# Patient Record
Sex: Female | Born: 1965
Health system: Southern US, Community
[De-identification: ages and names within clinical notes are randomized; demographics above are authoritative.]

## PROBLEM LIST (undated history)

## (undated) DIAGNOSIS — I10 Essential (primary) hypertension: Secondary | ICD-10-CM

## (undated) DIAGNOSIS — I700 Atherosclerosis of aorta: Secondary | ICD-10-CM

## (undated) DIAGNOSIS — K219 Gastro-esophageal reflux disease without esophagitis: Secondary | ICD-10-CM

## (undated) DIAGNOSIS — S82899A Other fracture of unspecified lower leg, initial encounter for closed fracture: Secondary | ICD-10-CM

## (undated) DIAGNOSIS — E119 Type 2 diabetes mellitus without complications: Secondary | ICD-10-CM

## (undated) DIAGNOSIS — M19019 Primary osteoarthritis, unspecified shoulder: Secondary | ICD-10-CM

## (undated) DIAGNOSIS — M179 Osteoarthritis of knee, unspecified: Secondary | ICD-10-CM

## (undated) DIAGNOSIS — J40 Bronchitis, not specified as acute or chronic: Secondary | ICD-10-CM

## (undated) DIAGNOSIS — G4730 Sleep apnea, unspecified: Secondary | ICD-10-CM

## (undated) HISTORY — DX: Osteoarthritis of knee, unspecified: M17.9

## (undated) HISTORY — PX: COLONOSCOPY: SHX174

## (undated) HISTORY — PX: NO PAST SURGERIES: SHX2092

## (undated) HISTORY — DX: Essential (primary) hypertension: I10

---

## 2014-05-30 DIAGNOSIS — S82899A Other fracture of unspecified lower leg, initial encounter for closed fracture: Secondary | ICD-10-CM

## 2014-05-30 HISTORY — DX: Other fracture of unspecified lower leg, initial encounter for closed fracture: S82.899A

## 2015-04-13 ENCOUNTER — Emergency Department (HOSPITAL_COMMUNITY)
Admission: EM | Admit: 2015-04-13 | Discharge: 2015-04-13 | Disposition: A | Discharge status: Home / Self Care | Payer: Self-pay | Attending: Emergency Medicine | Admitting: Emergency Medicine

## 2015-04-13 ENCOUNTER — Encounter (HOSPITAL_COMMUNITY): Payer: Self-pay | Admitting: Family Medicine

## 2015-04-13 DIAGNOSIS — I889 Nonspecific lymphadenitis, unspecified: Secondary | ICD-10-CM | POA: Insufficient documentation

## 2015-04-13 DIAGNOSIS — H9209 Otalgia, unspecified ear: Secondary | ICD-10-CM | POA: Insufficient documentation

## 2015-04-13 DIAGNOSIS — F17210 Nicotine dependence, cigarettes, uncomplicated: Secondary | ICD-10-CM | POA: Insufficient documentation

## 2015-04-13 DIAGNOSIS — J029 Acute pharyngitis, unspecified: Secondary | ICD-10-CM | POA: Insufficient documentation

## 2015-04-13 LAB — RAPID STREP SCREEN (MED CTR MEBANE ONLY): STREPTOCOCCUS, GROUP A SCREEN (DIRECT): NEGATIVE

## 2015-04-13 MED ORDER — IBUPROFEN 600 MG PO TABS: 600.0000 mg | ORAL_TABLET | Freq: Four times a day (QID) | ORAL | Status: DC | PRN

## 2015-04-13 MED ORDER — HYDROCODONE-ACETAMINOPHEN 7.5-325 MG/15ML PO SOLN
10.0000 mL | Freq: Once | ORAL | Status: AC
  Administered 2015-04-13: 10 mL via ORAL
  Filled 2015-04-13: qty 15

## 2015-04-13 MED ORDER — NAPROXEN 500 MG PO TABS
500.0000 mg | ORAL_TABLET | Freq: Once | ORAL | Status: AC
  Administered 2015-04-13: 500 mg via ORAL
  Filled 2015-04-13: qty 1

## 2015-04-13 MED ORDER — HYDROCODONE-ACETAMINOPHEN 7.5-325 MG/15ML PO SOLN: 15.0000 mL | Freq: Three times a day (TID) | ORAL | Status: DC | PRN

## 2015-04-13 MED ORDER — DEXAMETHASONE SODIUM PHOSPHATE 10 MG/ML IJ SOLN
10.0000 mg | Freq: Once | INTRAMUSCULAR | Status: AC
  Administered 2015-04-13: 10 mg via INTRAMUSCULAR
  Filled 2015-04-13: qty 1

## 2015-04-13 NOTE — ED Notes (Addendum)
Pt is complaining of sore throat with horseness and left ear ache yesterday. Reports fever. Took IBUPROFEN 800mg  at 20:00 last night.

## 2015-04-13 NOTE — ED Provider Notes (Signed)
CSN: [id number]     Arrival date & time 04/13/15  0138 History   First MD Initiated Contact with Patient 04/13/15 0148     Chief Complaint  Patient presents with  . Sore Throat  . Otalgia    (Consider location/radiation/quality/duration/timing/severity/associated sxs/prior Treatment) HPI Comments: 49 year old female presents to the emergency department for further evaluation of sore throat. Patient states that sore throat began yesterday and has become progressively worse. Symptoms aggravated with swallowing. She reports some hoarseness to her voice as well as developing a left earache which is pressure-like in nature. Patient reports taking ibuprofen which helped for a portion of the afternoon yesterday. Patient states that pain worsened when the medication wore off. She states that she has been around her grandson who is 80 years old and recently had an upper respiratory infection. She denies any other known sick contacts. No fever, ear discharge, or drooling. No shortness of breath.  Patient is a 49 y.o. female presenting with pharyngitis and ear pain. The history is provided by the patient. No language interpreter was used.  Sore Throat Associated symptoms include congestion and a sore throat. Pertinent negatives include no coughing, fever or vomiting.  Otalgia Associated symptoms: congestion and sore throat   Associated symptoms: no cough, no diarrhea, no fever and no vomiting     History reviewed. No pertinent past medical history. History reviewed. No pertinent past surgical history. History reviewed. No pertinent family history. Social History  Substance Use Topics  . Smoking status: Current Every Day Smoker -- 0.50 packs/day    Types: Cigarettes  . Smokeless tobacco: None  . Alcohol Use: No   OB History    No data available      Review of Systems  Constitutional: Negative for fever.  HENT: Positive for congestion, ear pain (pressure L ear), sinus pressure and sore  throat.   Respiratory: Negative for cough and shortness of breath.   Gastrointestinal: Negative for vomiting and diarrhea.  All other systems reviewed and are negative.   Allergies  Review of patient's allergies indicates not on file.  Home Medications   Prior to Admission medications   Medication Sig Start Date End Date Taking? Authorizing Provider  HYDROcodone-acetaminophen (HYCET) 7.5-325 mg/15 ml solution Take 15 mLs by mouth every 8 (eight) hours as needed for moderate pain or severe pain. 04/13/15   Antonietta Breach, PA-C  ibuprofen (ADVIL,MOTRIN) 600 MG tablet Take 1 tablet (600 mg total) by mouth every 6 (six) hours as needed. 04/13/15   Antonietta Breach, PA-C   BP 135/81 mmHg  Pulse 78  Temp(Src) 98.1 F (36.7 C) (Oral)  Resp 13  Ht 5\' 2"  (1.575 m)  Wt 239 lb (108.41 kg)  BMI 43.70 kg/m2  SpO2 93%   Physical Exam  Constitutional: She is oriented to person, place, and time. She appears well-developed and well-nourished. No distress.  Nontoxic/nonseptic appearing  HENT:  Head: Normocephalic and atraumatic.  Right Ear: External ear and ear canal normal. No drainage. Tympanic membrane is injected (injected compared to left TM). Tympanic membrane is not perforated.  Left Ear: Tympanic membrane, external ear and ear canal normal. No drainage. Tympanic membrane is not perforated.  Nose: Right sinus exhibits no maxillary sinus tenderness and no frontal sinus tenderness. Left sinus exhibits maxillary sinus tenderness. Left sinus exhibits no frontal sinus tenderness.  Mouth/Throat: Uvula is midline and mucous membranes are normal. No trismus in the jaw. Posterior oropharyngeal edema (mild) and posterior oropharyngeal erythema present. No oropharyngeal exudate or tonsillar  abscesses.  Posterior oropharyngeal erythema with mild edema. Uvula midline. Patient tolerating secretions with discomfort. No drooling. No stridor noted.  Eyes: Conjunctivae and EOM are normal. No scleral icterus.  Neck:  Normal range of motion.  No nuchal rigidity or meningismus  Pulmonary/Chest: Effort normal. No respiratory distress.  Respirations even and unlabored  Musculoskeletal: Normal range of motion.  Lymphadenopathy:       Head (right side): Submandibular (mild) adenopathy present. No submental, no tonsillar and no preauricular adenopathy present.       Head (left side): Submandibular, tonsillar and preauricular adenopathy present.  Tender lymphadenopathy  Neurological: She is alert and oriented to person, place, and time. She exhibits normal muscle tone. Coordination normal.  Skin: Skin is warm and dry. No rash noted. She is not diaphoretic. No erythema. No pallor.  Psychiatric: She has a normal mood and affect. Her behavior is normal.  Nursing note and vitals reviewed.   ED Course  Procedures (including critical care time) Labs Review Labs Reviewed  RAPID STREP SCREEN (NOT AT [facility])  CULTURE, GROUP A STREP    Imaging Review No results found.   I have personally reviewed and evaluated these images and lab results as part of my medical decision-making.   EKG Interpretation None      MDM   Final diagnoses:  Viral pharyngitis  Lymphadenitis    Patient presents with cervical lymphadenopathy and dysphagia, onset yesterday. Pt afebrile without tonsillar exudate, negative strep; diagnosis of viral pharyngitis. No abx indicated. Patient tolerating secretions. No nuchal rigidity or meningismus. No stridor or trismus. Presentation not concerning for PTA or infxn spread to soft tissue. No trismus or uvula deviation. Will d/c with symptomatic tx for pain and have discussed importance of oral fluid hydration. Specific return precautions discussed. Recommended PCP follow up; patient is reliable for PCP f/u. Patient discharged in good condition with no unaddressed concerns.   Filed Vitals:   04/13/15 0145 04/13/15 0406  BP: 133/76 135/81  Pulse: 85 78  Temp: 98.1 F (36.7 C)   TempSrc:  Oral   Resp: 20 13  Height: 5\' 2"  (1.575 m)   Weight: 239 lb (108.41 kg)   SpO2: 96% 93%     Antonietta Breach, PA-C [id number]  Delora Fuel, MD [phone] [id number]

## 2015-04-13 NOTE — Discharge Instructions (Signed)
Use salt water gargles 2-3 times per day. Take Ibuprofen as prescribed for pain and swelling. You may use Hycet for worsening sore throat. Drink warm liquids to try and soothe your throat such as warm tea with honey. Follow up with your primary care provider on Tuesday for a recheck of symptoms. Return to the ED as needed if symptoms worsen.  Pharyngitis Pharyngitis is redness, pain, and swelling (inflammation) of your pharynx.  CAUSES  Pharyngitis is usually caused by infection. Most of the time, these infections are from viruses (viral) and are part of a cold. However, sometimes pharyngitis is caused by bacteria (bacterial). Pharyngitis can also be caused by allergies. Viral pharyngitis may be spread from person to person by coughing, sneezing, and personal items or utensils (cups, forks, spoons, toothbrushes). Bacterial pharyngitis may be spread from person to person by more intimate contact, such as kissing.  SIGNS AND SYMPTOMS  Symptoms of pharyngitis include:   Sore throat.   Tiredness (fatigue).   Low-grade fever.   Headache.  Joint pain and muscle aches.  Skin rashes.  Swollen lymph nodes.  Plaque-like film on throat or tonsils (often seen with bacterial pharyngitis). DIAGNOSIS  Your health care provider will ask you questions about your illness and your symptoms. Your medical history, along with a physical exam, is often all that is needed to diagnose pharyngitis. Sometimes, a rapid strep test is done. Other lab tests may also be done, depending on the suspected cause.  TREATMENT  Viral pharyngitis will usually get better in 3-4 days without the use of medicine. Bacterial pharyngitis is treated with medicines that kill germs (antibiotics).  HOME CARE INSTRUCTIONS   Drink enough water and fluids to keep your urine clear or pale yellow.   Only take over-the-counter or prescription medicines as directed by your health care provider:   If you are prescribed antibiotics,  make sure you finish them even if you start to feel better.   Do not take aspirin.   Get lots of rest.   Gargle with 8 oz of salt water ( tsp of salt per 1 qt of water) as often as every 1-2 hours to soothe your throat.   Throat lozenges (if you are not at risk for choking) or sprays may be used to soothe your throat. SEEK MEDICAL CARE IF:   You have large, tender lumps in your neck.  You have a rash.  You cough up green, yellow-brown, or bloody spit. SEEK IMMEDIATE MEDICAL CARE IF:   Your neck becomes stiff.  You drool or are unable to swallow liquids.  You vomit or are unable to keep medicines or liquids down.  You have severe pain that does not go away with the use of recommended medicines.  You have trouble breathing (not caused by a stuffy nose). MAKE SURE YOU:   Understand these instructions.  Will watch your condition.  Will get help right away if you are not doing well or get worse.   This information is not intended to replace advice given to you by your health care provider. Make sure you discuss any questions you have with your health care provider.   Document Released: 05/16/2005 Document Revised: 03/06/2013 Document Reviewed: 01/21/2013 Elsevier Interactive Patient Education 2016 [employer]  Lymphadenopathy Lymphadenopathy refers to swollen or enlarged lymph glands, also called lymph nodes. Lymph glands are part of your body's defense (immune) system, which protects the body from infections, germs, and diseases. Lymph glands are found in many locations in your  body, including the neck, underarm, and groin.  Many things can cause lymph glands to become enlarged. When your immune system responds to germs, such as viruses or bacteria, infection-fighting cells and fluid build up. This causes the glands to grow in size. Usually, this is not something to worry about. The swelling and any soreness often go away without treatment. However, swollen lymph glands  can also be caused by a number of diseases. Your health care provider may do various tests to help determine the cause. If the cause of your swollen lymph glands cannot be found, it is important to monitor your condition to make sure the swelling goes away. HOME CARE INSTRUCTIONS Watch your condition for any changes. The following actions may help to lessen any discomfort you are feeling:  Get plenty of rest.  Take medicines only as directed by your health care provider. Your health care provider may recommend over-the-counter medicines for pain.  Apply moist heat compresses to the site of swollen lymph nodes as directed by your health care provider. This can help reduce any pain.  Check your lymph nodes daily for any changes.  Keep all follow-up visits as directed by your health care provider. This is important. SEEK MEDICAL CARE IF:  Your lymph nodes are still swollen after 2 weeks.  Your swelling increases or spreads to other areas.  Your lymph nodes are hard, seem fixed to the skin, or are growing rapidly.  Your skin over the lymph nodes is red and inflamed.  You have a fever.  You have chills.  You have fatigue.  You develop a sore throat.  You have abdominal pain.  You have weight loss.  You have night sweats. SEEK IMMEDIATE MEDICAL CARE IF:  You notice fluid leaking from the area of the enlarged lymph node.  You have severe pain in any area of your body.  You have chest pain.  You have shortness of breath.   This information is not intended to replace advice given to you by your health care provider. Make sure you discuss any questions you have with your health care provider.   Document Released: 02/23/2008 Document Revised: 06/06/2014 Document Reviewed: 12/19/2013 Elsevier Interactive Patient Education [employer].

## 2015-04-15 ENCOUNTER — Emergency Department (HOSPITAL_COMMUNITY)
Admission: EM | Admit: 2015-04-15 | Discharge: 2015-04-15 | Disposition: A | Discharge status: Home / Self Care | Payer: Self-pay | Attending: Emergency Medicine | Admitting: Emergency Medicine

## 2015-04-15 ENCOUNTER — Encounter (HOSPITAL_COMMUNITY): Payer: Self-pay | Admitting: Emergency Medicine

## 2015-04-15 DIAGNOSIS — H6692 Otitis media, unspecified, left ear: Secondary | ICD-10-CM | POA: Insufficient documentation

## 2015-04-15 DIAGNOSIS — F17210 Nicotine dependence, cigarettes, uncomplicated: Secondary | ICD-10-CM | POA: Insufficient documentation

## 2015-04-15 DIAGNOSIS — J029 Acute pharyngitis, unspecified: Secondary | ICD-10-CM | POA: Insufficient documentation

## 2015-04-15 LAB — CULTURE, GROUP A STREP

## 2015-04-15 MED ORDER — AMOXICILLIN 500 MG PO CAPS: 500.0000 mg | ORAL_CAPSULE | Freq: Two times a day (BID) | ORAL | Status: DC

## 2015-04-15 MED ORDER — CETIRIZINE-PSEUDOEPHEDRINE ER 5-120 MG PO TB12: 1.0000 | ORAL_TABLET | Freq: Two times a day (BID) | ORAL | Status: DC

## 2015-04-15 NOTE — ED Provider Notes (Signed)
CSN: [id number]     Arrival date & time 04/15/15  1633 History  By signing my name below, I, Helane Gunther, attest that this documentation has been prepared under the direction and in the presence of [employer], PA-C. Electronically Signed: Helane Gunther, ED Scribe. 04/15/2015. 6:27 PM.    Chief Complaint  Patient presents with  . Sore Throat   The history is provided by the patient. No language interpreter was used.    HPI Comments: Ann Foster is a 49 y.o. female smoker at 0.5 ppd who was seen 2 days ago for a sore throat, presents to the Emergency Department complaining of constant, aching, left-sided throat pain onset 3 days ago. Pt states this began as a left-sided ear ache, then progressively worsened to painful swallowing and feeling as though her throat was closing, which is when she came to the ED 2 days ago, when she was given liquid hydrocodone, a steroid shot, and motrin. She notes she felt nauseated after taking the liquid hydrocodone and has only been taking motrin, with insufficient relief. She reports associated left-sided, pressure-like ear pain, pain with swallowing, mild cough, left-sided facial pain, rhinorrhea, and generalized body aches. She also notes her eyes have been watery, but states that this is baseline for her. She reports exacerbation of the pain with lying on her left side. She notes alleviation with applying slight pressure to the left side of the neck. She notes she is able to drink warm liquids and eat soup. She states she is currently in the process of weaning herself off of cigarettes. She states she is not taking any allergy medication. She denies a PSHx of tonsillectomy. Pt denies fever (mild fever 2 days ago, none today), neck stiffness, CP, SOB, difficulty breathing, drooling, choking, ear discharge, and decreased hearing.    History reviewed. No pertinent past medical history. History reviewed. No pertinent past surgical history. History reviewed. No  pertinent family history. Social History  Substance Use Topics  . Smoking status: Current Every Day Smoker -- 0.50 packs/day    Types: Cigarettes  . Smokeless tobacco: None  . Alcohol Use: No   OB History    No data available     Review of Systems A complete 10 system review of systems was obtained and all systems are negative except as noted in the HPI and PMH.  Allergies  Review of patient's allergies indicates no known allergies.  Home Medications   Prior to Admission medications   Medication Sig Start Date End Date Taking? Authorizing Provider  amoxicillin (AMOXIL) 500 MG capsule Take 1 capsule (500 mg total) by mouth 2 (two) times daily. 04/15/15   Trevon Strothers, PA-C  cetirizine-pseudoephedrine (ZYRTEC-D) 5-120 MG tablet Take 1 tablet by mouth 2 (two) times daily. 04/15/15   Gloriann Loan, PA-C  HYDROcodone-acetaminophen (HYCET) 7.5-325 mg/15 ml solution Take 15 mLs by mouth every 8 (eight) hours as needed for moderate pain or severe pain. 04/13/15   Antonietta Breach, PA-C  ibuprofen (ADVIL,MOTRIN) 600 MG tablet Take 1 tablet (600 mg total) by mouth every 6 (six) hours as needed. 04/13/15   Antonietta Breach, PA-C   BP 129/75 mmHg  Pulse 88  Temp(Src) 98.2 F (36.8 C) (Oral)  Resp 20  SpO2 96% Physical Exam  Constitutional: She is oriented to person, place, and time. She appears well-developed and well-nourished.  HENT:  Head: Normocephalic and atraumatic.  Right Ear: Hearing, tympanic membrane, external ear and ear canal normal.  Left Ear: Hearing and ear canal  normal. There is tenderness. No drainage or swelling. No mastoid tenderness. Tympanic membrane is injected, erythematous and bulging. Tympanic membrane is not retracted. A middle ear effusion is present. No decreased hearing is noted.  Nose: Nose normal.  Mouth/Throat: Uvula is midline and mucous membranes are normal. No oral lesions. No trismus in the jaw. No uvula swelling. Posterior oropharyngeal erythema present. No  oropharyngeal exudate, posterior oropharyngeal edema or tonsillar abscesses.  Eyes: Conjunctivae are normal. Pupils are equal, round, and reactive to light.  Neck: Normal range of motion and phonation normal. Neck supple. No rigidity. No tracheal deviation present.  Cardiovascular: Normal rate, regular rhythm and normal heart sounds.   No murmur heard. Pulmonary/Chest: Effort normal and breath sounds normal. No accessory muscle usage or stridor. No respiratory distress. She has no wheezes. She has no rhonchi. She has no rales.  Abdominal: Soft. Bowel sounds are normal. She exhibits no distension. There is no tenderness.  Musculoskeletal: Normal range of motion.  Lymphadenopathy:    She has cervical adenopathy (mild, left sided).  Neurological: She is alert and oriented to person, place, and time.  Speech clear without dysarthria.  Skin: Skin is warm and dry. She is not diaphoretic.  Psychiatric: She has a normal mood and affect. Her behavior is normal.  Nursing note and vitals reviewed.  ED Course  Procedures  DIAGNOSTIC STUDIES: Oxygen Saturation is 96% on RA, normal by my interpretation.    COORDINATION OF CARE: 6:19 PM - Discussed plans to treat as an ear infection. Will prescribe antibiotics and zyrtec. Pt advised of plan for treatment and pt agrees.  Labs Review Labs Reviewed - No data to display  Imaging Review No results found. I have personally reviewed and evaluated these images and lab results as part of my medical decision-making.   EKG Interpretation None      MDM   Final diagnoses:  Left otitis media, recurrence not specified, unspecified chronicity, unspecified otitis media type  Sore throat    Patient presents with worsening left sided earache and sore throat.  Seen 2 days ago for similar.  Patient taking medications as prescribed.  VSS, NAD.  On exam, left TM is erythematous and bulging.  I also believe there is a possible effusion.  Uvula midline.  No  oropharyngeal exudates, mild erythema.  No peritonsillar abscess.  Airway patent.  Mild anterior left cervical lymphadenopathy.  Remaining exam unremarkable.  Suspect AOM, will give abx.  Doubt peritonsillar abscess.  Doubt epiglottitis.  Doubt mastoiditis.  Doubt meningitis.  Evaluation does not show pathology requring ongoing emergent intervention or admission. Pt is hemodynamically stable and mentating appropriately. Discussed findings/results and plan with patient/guardian, who agrees with plan. All questions answered. Return precautions discussed and outpatient follow up given.    I personally performed the services described in this documentation, which was scribed in my presence. The recorded information has been reviewed and is accurate.    Gloriann Loan, PA-C 04/15/15 [place], MD 04/16/15 [phone]

## 2015-04-15 NOTE — ED Notes (Signed)
Pt states she was seen here recently for her sore throat. States every time she goes to take the prescribed hydrocodone it makes her nauseous. States she's been taking motrin to help manage the pain. States "I was hoping to get something to make this go away faster, like an antibiotic. They told me to come back if I thought it was getting worse." Tonsils red and enlarged, no white spots visible, pain primarily to left sided jaw.

## 2015-04-15 NOTE — Discharge Instructions (Signed)
Otitis Media, Adult  Otitis media is redness, soreness, and inflammation of the middle ear. Otitis media may be caused by allergies or, most commonly, by infection. Often it occurs as a complication of the common cold.  SIGNS AND SYMPTOMS  Symptoms of otitis media may include:   Earache.   Fever.   Ringing in your ear.   Headache.   Leakage of fluid from the ear.  DIAGNOSIS  To diagnose otitis media, your health care provider will examine your ear with an otoscope. This is an instrument that allows your health care provider to see into your ear in order to examine your eardrum. Your health care provider also will ask you questions about your symptoms.  TREATMENT   Typically, otitis media resolves on its own within 3-5 days. Your health care provider may prescribe medicine to ease your symptoms of pain. If otitis media does not resolve within 5 days or is recurrent, your health care provider may prescribe antibiotic medicines if he or she suspects that a bacterial infection is the cause.  HOME CARE INSTRUCTIONS    If you were prescribed an antibiotic medicine, finish it all even if you start to feel better.   Take medicines only as directed by your health care provider.   Keep all follow-up visits as directed by your health care provider.  SEEK MEDICAL CARE IF:   You have otitis media only in one ear, or bleeding from your nose, or both.   You notice a lump on your neck.   You are not getting better in 3-5 days.   You feel worse instead of better.  SEEK IMMEDIATE MEDICAL CARE IF:    You have pain that is not controlled with medicine.   You have swelling, redness, or pain around your ear or stiffness in your neck.   You notice that part of your face is paralyzed.   You notice that the bone behind your ear (mastoid) is tender when you touch it.  MAKE SURE YOU:    Understand these instructions.   Will watch your condition.   Will get help right away if you are not doing well or get worse.     This  information is not intended to replace advice given to you by your health care provider. Make sure you discuss any questions you have with your health care provider.     Document Released: 02/19/2004 Document Revised: 06/06/2014 Document Reviewed: 12/11/2012  Elsevier Interactive Patient Education 2016 [employer]  Pharyngitis  Pharyngitis is redness, pain, and swelling (inflammation) of your pharynx.   CAUSES   Pharyngitis is usually caused by infection. Most of the time, these infections are from viruses (viral) and are part of a cold. However, sometimes pharyngitis is caused by bacteria (bacterial). Pharyngitis can also be caused by allergies. Viral pharyngitis may be spread from person to person by coughing, sneezing, and personal items or utensils (cups, forks, spoons, toothbrushes). Bacterial pharyngitis may be spread from person to person by more intimate contact, such as kissing.   SIGNS AND SYMPTOMS   Symptoms of pharyngitis include:    Sore throat.    Tiredness (fatigue).    Low-grade fever.    Headache.   Joint pain and muscle aches.   Skin rashes.   Swollen lymph nodes.   Plaque-like film on throat or tonsils (often seen with bacterial pharyngitis).  DIAGNOSIS   Your health care provider will ask you questions about your illness and your symptoms. Your medical history,   along with a physical exam, is often all that is needed to diagnose pharyngitis. Sometimes, a rapid strep test is done. Other lab tests may also be done, depending on the suspected cause.   TREATMENT   Viral pharyngitis will usually get better in 3-4 days without the use of medicine. Bacterial pharyngitis is treated with medicines that kill germs (antibiotics).   HOME CARE INSTRUCTIONS    Drink enough water and fluids to keep your urine clear or pale yellow.    Only take over-the-counter or prescription medicines as directed by your health care provider:    If you are prescribed antibiotics, make sure you finish them  even if you start to feel better.    Do not take aspirin.    Get lots of rest.    Gargle with 8 oz of salt water ( tsp of salt per 1 qt of water) as often as every 1-2 hours to soothe your throat.    Throat lozenges (if you are not at risk for choking) or sprays may be used to soothe your throat.  SEEK MEDICAL CARE IF:    You have large, tender lumps in your neck.   You have a rash.   You cough up green, yellow-brown, or bloody spit.  SEEK IMMEDIATE MEDICAL CARE IF:    Your neck becomes stiff.   You drool or are unable to swallow liquids.   You vomit or are unable to keep medicines or liquids down.   You have severe pain that does not go away with the use of recommended medicines.   You have trouble breathing (not caused by a stuffy nose).  MAKE SURE YOU:    Understand these instructions.   Will watch your condition.   Will get help right away if you are not doing well or get worse.     This information is not intended to replace advice given to you by your health care provider. Make sure you discuss any questions you have with your health care provider.     Document Released: 05/16/2005 Document Revised: 03/06/2013 Document Reviewed: 01/21/2013  Elsevier Interactive Patient Education 2016 Elsevier Inc[employer]

## 2016-09-22 ENCOUNTER — Encounter (HOSPITAL_COMMUNITY): Payer: Self-pay | Admitting: Emergency Medicine

## 2016-09-22 ENCOUNTER — Emergency Department (HOSPITAL_COMMUNITY)
Admission: EM | Admit: 2016-09-22 | Discharge: 2016-09-22 | Disposition: A | Discharge status: Home / Self Care | Payer: Managed Care, Other (non HMO) | Attending: Emergency Medicine | Admitting: Emergency Medicine

## 2016-09-22 DIAGNOSIS — F17210 Nicotine dependence, cigarettes, uncomplicated: Secondary | ICD-10-CM | POA: Insufficient documentation

## 2016-09-22 DIAGNOSIS — K0889 Other specified disorders of teeth and supporting structures: Secondary | ICD-10-CM | POA: Diagnosis present

## 2016-09-22 DIAGNOSIS — K029 Dental caries, unspecified: Secondary | ICD-10-CM | POA: Diagnosis not present

## 2016-09-22 NOTE — ED Notes (Signed)
Patient is A&Ox4 at this time.  Patient in no signs of distress.  Please see providers note for complete history and physical exam.  

## 2016-09-22 NOTE — ED Triage Notes (Signed)
Pt presents to ED for assessment of right sided mouth pain staring yesterday.  Patient states she has "an exposed tooth" and has signed up for dental care but has not gotten a dentist yet.

## 2016-09-22 NOTE — ED Provider Notes (Signed)
[facility] DEPT Provider Note   CSN: [id number] Arrival date & time: 09/22/16  1731   By signing my name below, I, Delton Prairie, attest that this documentation has been prepared under the direction and in the presence of Leo Grosser, MD  Electronically Signed: Delton Prairie, ED Scribe. 09/22/16. 6:31 PM.   History   Chief Complaint Chief Complaint  Patient presents with  . Dental Pain    HPI Comments:  Ann Foster is a 51 y.o. female who presents to the Emergency Department complaining of acte onset, constant, moderate right upper dental pain beginning yesterday. She states her pain is radiating to her right ear due to her dental pain. She has been taking ibuprofen with mild relief. Pt denies any other associated symptoms. She is not followed by a dentist. No other complaints noted at this time.   The history is provided by the patient. No language interpreter was used.  Dental Pain   This is a new problem. The current episode started yesterday. The problem occurs constantly. The problem has not changed since onset.The pain is moderate. Treatments tried: NSAID. The treatment provided mild relief.    History reviewed. No pertinent past medical history.  There are no active problems to display for this patient.   History reviewed. No pertinent surgical history.  OB History    No data available       Home Medications    Prior to Admission medications   Medication Sig Start Date End Date Taking? Authorizing Provider  amoxicillin (AMOXIL) 500 MG capsule Take 1 capsule (500 mg total) by mouth 2 (two) times daily. 04/15/15   Kayla Rose, PA-C  cetirizine-pseudoephedrine (ZYRTEC-D) 5-120 MG tablet Take 1 tablet by mouth 2 (two) times daily. 04/15/15   Gloriann Loan, PA-C  HYDROcodone-acetaminophen (HYCET) 7.5-325 mg/15 ml solution Take 15 mLs by mouth every 8 (eight) hours as needed for moderate pain or severe pain. 04/13/15   Antonietta Breach, PA-C  ibuprofen (ADVIL,MOTRIN) 600 MG  tablet Take 1 tablet (600 mg total) by mouth every 6 (six) hours as needed. 04/13/15   Antonietta Breach, PA-C    Family History History reviewed. No pertinent family history.  Social History Social History  Substance Use Topics  . Smoking status: Current Every Day Smoker    Packs/day: 0.30    Types: Cigarettes  . Smokeless tobacco: Never Used  . Alcohol use Yes     Comment: once in a while     Allergies   Patient has no known allergies.   Review of Systems Review of Systems  Constitutional: Negative for fever.  HENT: Positive for dental problem.   All other systems reviewed and are negative.  Physical Exam Updated Vital Signs BP (!) 156/86 (BP Location: Left Arm)   Pulse 77   Temp 98.2 F (36.8 C) (Oral)   Resp 18   SpO2 98%   Physical Exam  Constitutional: She is oriented to person, place, and time. She appears well-developed and well-nourished. No distress.  HENT:  Head: Normocephalic.  Nose: Nose normal.  Mouth/Throat: Dental caries present.  Dental caries and a dentin defect in the posterior portion of the upper right second molar.   Eyes: Conjunctivae are normal.  Neck: Neck supple. No tracheal deviation present.  Cardiovascular: Normal rate and regular rhythm.   Pulmonary/Chest: Effort normal. No respiratory distress.  Abdominal: Soft. She exhibits no distension.  Neurological: She is alert and oriented to person, place, and time.  Skin: Skin is warm and dry.  Psychiatric: She has a normal mood and affect.   ED Treatments / Results  DIAGNOSTIC STUDIES:  Oxygen Saturation is 98% on RA, normal by my interpretation.    COORDINATION OF CARE:  6:30 PM Discussed treatment plan with pt at bedside and pt agreed to plan.  Labs (all labs ordered are listed, but only abnormal results are displayed) Labs Reviewed - No data to display  EKG  EKG Interpretation None       Radiology No results found.  Procedures Procedures (including critical care  time)  Medications Ordered in ED Medications - No data to display   Initial Impression / Assessment and Plan / ED Course  I have reviewed the triage vital signs and the nursing notes.  Pertinent labs & imaging results that were available during my care of the patient were reviewed by me and considered in my medical decision making (see chart for details).     51 y.o. female presents with uncomplicated dental pain from caries and enamel breakdown. Likely pulpitis without abscess. Referred to dentistry, ibuprofen for sx.  Final Clinical Impressions(s) / ED Diagnoses   Final diagnoses:  Pain, dental  Dental caries    New Prescriptions New Prescriptions   No medications on file  I personally performed the services described in this documentation, which was scribed in my presence. The recorded information has been reviewed and is accurate.       Leo Grosser, MD 09/23/16 [phone]

## 2016-11-23 ENCOUNTER — Ambulatory Visit (HOSPITAL_COMMUNITY)
Admission: EM | Admit: 2016-11-23 | Discharge: 2016-11-23 | Disposition: A | Discharge status: Home / Self Care | Payer: Managed Care, Other (non HMO) | Attending: Family Medicine | Admitting: Family Medicine

## 2016-11-23 ENCOUNTER — Encounter (HOSPITAL_COMMUNITY): Payer: Self-pay | Admitting: Emergency Medicine

## 2016-11-23 DIAGNOSIS — R350 Frequency of micturition: Secondary | ICD-10-CM | POA: Diagnosis not present

## 2016-11-23 DIAGNOSIS — R300 Dysuria: Secondary | ICD-10-CM | POA: Diagnosis not present

## 2016-11-23 DIAGNOSIS — F17210 Nicotine dependence, cigarettes, uncomplicated: Secondary | ICD-10-CM | POA: Insufficient documentation

## 2016-11-23 DIAGNOSIS — N390 Urinary tract infection, site not specified: Secondary | ICD-10-CM | POA: Insufficient documentation

## 2016-11-23 LAB — POCT URINALYSIS DIP (DEVICE)
BILIRUBIN URINE: NEGATIVE
Glucose, UA: 100 mg/dL — AB
KETONES UR: NEGATIVE mg/dL
Nitrite: NEGATIVE
PH: 5 (ref 5.0–8.0)
PROTEIN: 100 mg/dL — AB
Urobilinogen, UA: 1 mg/dL (ref 0.0–1.0)

## 2016-11-23 MED ORDER — CEPHALEXIN 500 MG PO CAPS: 500.0000 mg | ORAL_CAPSULE | Freq: Four times a day (QID) | ORAL | 0 refills | Status: DC

## 2016-11-23 NOTE — ED Provider Notes (Signed)
CSN: [id number]     Arrival date & time 11/23/16  1519 History   First MD Initiated Contact with Patient 11/23/16 1540     Chief Complaint  Patient presents with  . Urinary Tract Infection   (Consider location/radiation/quality/duration/timing/severity/associated sxs/prior Treatment) 51 year old female complaining of dysuria, urinary frequency and urgency for 2-3 days. Denies abdominal pain, nausea, vomiting, fever or chills.      History reviewed. No pertinent past medical history. History reviewed. No pertinent surgical history. History reviewed. No pertinent family history. Social History  Substance Use Topics  . Smoking status: Current Every Day Smoker    Packs/day: 0.30    Types: Cigarettes  . Smokeless tobacco: Never Used  . Alcohol use Yes     Comment: once in a while   OB History    No data available     Review of Systems  Constitutional: Negative.   Respiratory: Negative.   Gastrointestinal: Negative.   Genitourinary: Positive for dysuria and urgency. Negative for vaginal bleeding and vaginal discharge.  All other systems reviewed and are negative.   Allergies  Patient has no known allergies.  Home Medications   Prior to Admission medications   Medication Sig Start Date End Date Taking? Authorizing Provider  cephALEXin (KEFLEX) 500 MG capsule Take 1 capsule (500 mg total) by mouth 4 (four) times daily. 11/23/16   Janne Napoleon, NP   Meds Ordered and Administered this Visit  Medications - No data to display  BP (!) 143/89 (BP Location: Right Arm)   Pulse 92   Temp 98.6 F (37 C) (Oral)   SpO2 95%  No data found.   Physical Exam  Constitutional: She is oriented to person, place, and time. She appears well-developed and well-nourished. No distress.  Eyes: EOM are normal.  Neck: Normal range of motion. Neck supple.  Cardiovascular: Normal rate.   Pulmonary/Chest: Effort normal. No respiratory distress.  Musculoskeletal: She exhibits no edema.   Neurological: She is alert and oriented to person, place, and time. She exhibits normal muscle tone.  Skin: Skin is warm and dry.  Psychiatric: She has a normal mood and affect.  Nursing note and vitals reviewed.   Urgent Care Course     Procedures (including critical care time)  Labs Review Labs Reviewed  POCT URINALYSIS DIP (DEVICE) - Abnormal; Notable for the following:       Result Value   Glucose, UA 100 (*)    Hgb urine dipstick MODERATE (*)    Protein, ur 100 (*)    Leukocytes, UA LARGE (*)    All other components within normal limits  URINE CULTURE    Imaging Review No results found.   Visual Acuity Review  Right Eye Distance:   Left Eye Distance:   Bilateral Distance:    Right Eye Near:   Left Eye Near:    Bilateral Near:         MDM   1. Lower urinary tract infectious disease    Take the AZO 3 times a day as directed and as needed for urinary tract symptoms. Start taking the antibiotic today as directed. Drink plenty of fluids. They will return if worse or follow-up with primary care provider. Meds ordered this encounter  Medications  . cephALEXin (KEFLEX) 500 MG capsule    Sig: Take 1 capsule (500 mg total) by mouth 4 (four) times daily.    Dispense:  28 capsule    Refill:  0    Order Specific Question:  Supervising Provider    Answer:   Vanessa Kick [[id number]]      Janne Napoleon, NP 11/23/16 1553

## 2016-11-23 NOTE — Discharge Instructions (Signed)
Take the AZO 3 times a day as directed and as needed for urinary tract symptoms. Start taking the antibiotic today as directed. Drink plenty of fluids. They will return if worse or follow-up with primary care provider.

## 2016-11-23 NOTE — ED Triage Notes (Signed)
Pt reports burning with urination for one week with frequent urination and pressure with urination and recent mild itching.  Pt tried Monistat OTC, with some relief.

## 2016-11-24 LAB — URINE CULTURE: SPECIAL REQUESTS: NORMAL

## 2017-01-29 ENCOUNTER — Emergency Department (HOSPITAL_COMMUNITY)
Admission: EM | Admit: 2017-01-29 | Discharge: 2017-01-29 | Disposition: A | Discharge status: Home / Self Care | Payer: 59 | Attending: Emergency Medicine | Admitting: Emergency Medicine

## 2017-01-29 ENCOUNTER — Emergency Department (HOSPITAL_COMMUNITY): Payer: 59

## 2017-01-29 ENCOUNTER — Encounter (HOSPITAL_COMMUNITY): Payer: Self-pay

## 2017-01-29 DIAGNOSIS — Z79899 Other long term (current) drug therapy: Secondary | ICD-10-CM | POA: Diagnosis not present

## 2017-01-29 DIAGNOSIS — F17210 Nicotine dependence, cigarettes, uncomplicated: Secondary | ICD-10-CM | POA: Diagnosis not present

## 2017-01-29 DIAGNOSIS — J069 Acute upper respiratory infection, unspecified: Secondary | ICD-10-CM

## 2017-01-29 DIAGNOSIS — R0981 Nasal congestion: Secondary | ICD-10-CM | POA: Diagnosis present

## 2017-01-29 HISTORY — DX: Bronchitis, not specified as acute or chronic: J40

## 2017-01-29 MED ORDER — HYDROCODONE-HOMATROPINE 5-1.5 MG/5ML PO SYRP: 5.0000 mL | ORAL_SOLUTION | Freq: Four times a day (QID) | ORAL | 0 refills | Status: DC | PRN

## 2017-01-29 MED ORDER — ALBUTEROL SULFATE HFA 108 (90 BASE) MCG/ACT IN AERS
2.0000 | INHALATION_SPRAY | RESPIRATORY_TRACT | Status: DC | PRN
  Administered 2017-01-29: 2 via RESPIRATORY_TRACT
  Filled 2017-01-29: qty 6.7

## 2017-01-29 NOTE — ED Triage Notes (Signed)
Patient complains of 1 day of cough, congestion and frontal headache. Cough worse when she tried to rest last night

## 2017-01-29 NOTE — ED Provider Notes (Signed)
[facility] DEPT Provider Note   CSN: [id number] Arrival date & time: 01/29/17  1143     History   Chief Complaint Chief Complaint  Patient presents with  . headache/congestion    HPI Ann Foster is a 51 y.o. female.  Patient is a 51 year old female who presents with a 2 day history of runny nose congestion coughing. She states that multiple people at work and had similar symptoms. She states it started with a tickle in her throat and progressed to nasal congestion with a bifrontal-type headache and sinus pressure associated with a cough which is productive of some white yellow sputum. She has shortness of breath only when she's coughing. No exertional shortness of breath. She's had some posttussive emesis. She's felt febrile at home but hasn't checked her temperature. She has some myalgias as well. No unilateral leg swelling. She has some soreness to her right chest but only with coughing. She's been using over-the-counter cold medicine with some improvement in symptoms.      Past Medical History:  Diagnosis Date  . Bronchitis     There are no active problems to display for this patient.   History reviewed. No pertinent surgical history.  OB History    No data available       Home Medications    Prior to Admission medications   Medication Sig Start Date End Date Taking? Authorizing Provider  cephALEXin (KEFLEX) 500 MG capsule Take 1 capsule (500 mg total) by mouth 4 (four) times daily. 11/23/16   Janne Napoleon, NP  HYDROcodone-homatropine (HYCODAN) 5-1.5 MG/5ML syrup Take 5 mLs by mouth every 6 (six) hours as needed for cough. 01/29/17   Malvin Johns, MD    Family History No family history on file.  Social History Social History  Substance Use Topics  . Smoking status: Current Every Day Smoker    Packs/day: 0.30    Types: Cigarettes  . Smokeless tobacco: Never Used  . Alcohol use Yes     Comment: once in a while     Allergies   Patient has no known  allergies.   Review of Systems Review of Systems  Constitutional: Positive for fatigue and fever. Negative for chills and diaphoresis.  HENT: Positive for congestion, postnasal drip, rhinorrhea, sneezing and sore throat.   Eyes: Negative.   Respiratory: Positive for cough and shortness of breath (Only during coughing spells). Negative for chest tightness.   Cardiovascular: Negative for chest pain and leg swelling.  Gastrointestinal: Positive for nausea and vomiting (post-tussive). Negative for abdominal pain, blood in stool and diarrhea.  Genitourinary: Negative for difficulty urinating, flank pain, frequency and hematuria.  Musculoskeletal: Positive for myalgias. Negative for arthralgias and back pain.  Skin: Negative for rash.  Neurological: Negative for dizziness, speech difficulty, weakness, numbness and headaches.     Physical Exam Updated Vital Signs BP (!) 149/89 (BP Location: Left Arm)   Pulse 89   Temp 98.6 F (37 C) (Oral)   Resp 18   SpO2 92%   Physical Exam  Constitutional: She is oriented to person, place, and time. She appears well-developed and well-nourished.  HENT:  Head: Normocephalic and atraumatic.  Eyes: Pupils are equal, round, and reactive to light.  Neck: Normal range of motion. Neck supple.  Cardiovascular: Normal rate, regular rhythm and normal heart sounds.   Pulmonary/Chest: Effort normal. No respiratory distress. She has wheezes (scant expiratiory wheezes). She has no rales. She exhibits tenderness (mild tenderness to right chest wall).  Abdominal: Soft. Bowel sounds  are normal. There is no tenderness. There is no rebound and no guarding.  Musculoskeletal: Normal range of motion. She exhibits no edema.  Lymphadenopathy:    She has no cervical adenopathy.  Neurological: She is alert and oriented to person, place, and time.  Skin: Skin is warm and dry. No rash noted.  Psychiatric: She has a normal mood and affect.     ED Treatments / Results    Labs (all labs ordered are listed, but only abnormal results are displayed) Labs Reviewed - No data to display  EKG  EKG Interpretation None       Radiology Dg Chest 2 View  Result Date: 01/29/2017 CLINICAL DATA:  Cough and congestion.  Cough worse when lying down. EXAM: CHEST  2 VIEW COMPARISON:  None. FINDINGS: The heart size and mediastinal contours are within normal limits. Both lungs are clear. The visualized skeletal structures are unremarkable. IMPRESSION: Negative two view chest x-ray Electronically Signed   By: San Morelle M.D.   On: 01/29/2017 12:30    Procedures Procedures (including critical care time)  Medications Ordered in ED Medications  albuterol (PROVENTIL HFA;VENTOLIN HFA) 108 (90 Base) MCG/ACT inhaler 2 puff (not administered)     Initial Impression / Assessment and Plan / ED Course  I have reviewed the triage vital signs and the nursing notes.  Pertinent labs & imaging results that were available during my care of the patient were reviewed by me and considered in my medical decision making (see chart for details).     Patient presents with URI symptoms that started yesterday. Her lungs are clear other than some scant expiratory wheezing. She was dispensed an albuterol inhaler to use for symptomatic relief. I rechecked her pulse oximetry and it was 94-96% on room air. There is no suggestions of pneumonia on x-ray. She's otherwise well-appearing. She was discharged home in good condition. She was given a prescription for Hycodan cough syrup. Return precautions were given.  Final Clinical Impressions(s) / ED Diagnoses   Final diagnoses:  Viral upper respiratory tract infection    New Prescriptions New Prescriptions   HYDROCODONE-HOMATROPINE (HYCODAN) 5-1.5 MG/5ML SYRUP    Take 5 mLs by mouth every 6 (six) hours as needed for cough.     Malvin Johns, MD 01/29/17 1320

## 2017-01-29 NOTE — ED Notes (Signed)
Declined W/C at D/C and was escorted to lobby by RN. 

## 2017-08-08 ENCOUNTER — Ambulatory Visit (HOSPITAL_COMMUNITY)
Admission: EM | Admit: 2017-08-08 | Discharge: 2017-08-08 | Disposition: A | Discharge status: Home / Self Care | Payer: 59 | Attending: Family Medicine | Admitting: Family Medicine

## 2017-08-08 ENCOUNTER — Encounter (HOSPITAL_COMMUNITY): Payer: Self-pay | Admitting: Family Medicine

## 2017-08-08 DIAGNOSIS — S29012A Strain of muscle and tendon of back wall of thorax, initial encounter: Secondary | ICD-10-CM | POA: Diagnosis not present

## 2017-08-08 MED ORDER — DICLOFENAC SODIUM 75 MG PO TBEC: 75.0000 mg | DELAYED_RELEASE_TABLET | Freq: Two times a day (BID) | ORAL | 0 refills | Status: DC

## 2017-08-08 MED ORDER — PREDNISONE 10 MG (21) PO TBPK: ORAL_TABLET | Freq: Every day | ORAL | 0 refills | Status: DC

## 2017-08-08 NOTE — ED Provider Notes (Signed)
  [facility]   [id number] 08/08/17 Arrival Time: 3009  ASSESSMENT & PLAN:  1. Upper back strain, initial encounter   -left trapezius strain  Meds ordered this encounter  Medications  . predniSONE (STERAPRED UNI-PAK 21 TAB) 10 MG (21) TBPK tablet    Sig: Take by mouth daily. Take as directed.    Dispense:  21 tablet    Refill:  0  . diclofenac (VOLTAREN) 75 MG EC tablet    Sig: Take 1 tablet (75 mg total) by mouth 2 (two) times daily.    Dispense:  14 tablet    Refill:  0   Will f/u here if not seeing improvement over the next week.  Reviewed expectations re: course of current medical issues. Questions answered. Outlined signs and symptoms indicating need for more acute intervention. Patient verbalized understanding. After Visit Summary given.  SUBJECTIVE: History from: patient. Geraldyne Barraclough is a 52 y.o. female who reports intermittent localized mild to moderate pain of her left upper back/shoulder that is stable; described as aching with occasional sharp pain without radiation. Onset: gradual, a week ago. Injury/trama: no. Relieved by: ibuprofen with mild relief. Worsened by: certain movements. Associated symptoms: none reported. Extremity sensation changes or weakness: none. 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  ROS: As per HPI.   OBJECTIVE:  Vitals:   08/08/17 1237  BP: 134/67  Pulse: 78  Resp: 18  Temp: 98.6 F (37 C)  SpO2: 97%    General appearance: alert; no distress Extremities: no cyanosis or edema; symmetrical with no gross deformities; localized tenderness over her left trapezius muscle distribution with no swelling and no bruising; ROM: normal of neck and upper extremities; no neck or back midline tenderness CV: normal extremity capillary refill Skin: warm and dry Neurologic: normal gait; normal symmetric reflexes in all extremities; normal sensation in all extremities Psychological: alert and  cooperative; normal mood and affect  No Known Allergies  Past Medical History:  Diagnosis Date  . Bronchitis    Social History   Socioeconomic History  . Marital status: Single    Spouse name: Not on file  . Number of children: Not on file  . Years of education: Not on file  . Highest education level: Not on file  Social Needs  . Financial resource strain: Not on file  . Food insecurity - worry: Not on file  . Food insecurity - inability: Not on file  . Transportation needs - medical: Not on file  . Transportation needs - non-medical: Not on file  Occupational History  . Not on file  Tobacco Use  . Smoking status: Current Every Day Smoker    Packs/day: 0.30    Types: Cigarettes  . Smokeless tobacco: Never Used  Substance and Sexual Activity  . Alcohol use: Yes    Comment: once in a while  . Drug use: No  . Sexual activity: Not on file  Other Topics Concern  . Not on file  Social History Narrative  . Not on file    History reviewed. No pertinent surgical history.    Vanessa Kick, MD 08/09/17 [phone]

## 2017-08-08 NOTE — ED Triage Notes (Signed)
Pt here for intermittent left shoulder pain x 1 week. She pulls dietary carts and work. sts radiation into left l;ateral neck and pain with moving and turning. sts ibuprofen takes the edge off.

## 2017-08-30 ENCOUNTER — Ambulatory Visit (HOSPITAL_COMMUNITY)
Admission: EM | Admit: 2017-08-30 | Discharge: 2017-08-30 | Disposition: A | Discharge status: Home / Self Care | Payer: 59 | Attending: Family Medicine | Admitting: Family Medicine

## 2017-08-30 ENCOUNTER — Encounter (HOSPITAL_COMMUNITY): Payer: Self-pay | Admitting: Emergency Medicine

## 2017-08-30 DIAGNOSIS — K0889 Other specified disorders of teeth and supporting structures: Secondary | ICD-10-CM

## 2017-08-30 DIAGNOSIS — J0140 Acute pansinusitis, unspecified: Secondary | ICD-10-CM | POA: Diagnosis not present

## 2017-08-30 MED ORDER — AMOXICILLIN-POT CLAVULANATE 875-125 MG PO TABS: 1.0000 | ORAL_TABLET | Freq: Two times a day (BID) | ORAL | 0 refills | Status: DC

## 2017-08-30 MED ORDER — IPRATROPIUM BROMIDE 0.06 % NA SOLN: 2.0000 | Freq: Four times a day (QID) | NASAL | 0 refills | Status: DC

## 2017-08-30 MED ORDER — HYDROCODONE-ACETAMINOPHEN 5-325 MG PO TABS: 1.0000 | ORAL_TABLET | Freq: Four times a day (QID) | ORAL | 0 refills | Status: DC | PRN

## 2017-08-30 MED ORDER — MELOXICAM 7.5 MG PO TABS: 7.5000 mg | ORAL_TABLET | Freq: Every day | ORAL | 0 refills | Status: DC

## 2017-08-30 NOTE — Discharge Instructions (Signed)
Augmentin for sinus infection. Start atrovent nasal spray. You can add one allergy medicine to help with nasal congestion/drainage. You can use over the counter nasal saline rinse such as neti pot for nasal congestion. Keep hydrated, your urine should be clear to pale yellow in color. Tylenol/motrin for fever and pain. Monitor for any worsening of symptoms, chest pain, shortness of breath, wheezing, swelling of the throat, follow up for reevaluation.   Augmentin will also cover for dental infection. Start Mobic for pain. You can add on norco as needed for further pain relief. Follow up with dentist for further treatment and evaluation. If experiencing swelling of the throat, trouble breathing, trouble swallowing, leaning forward to breath, drooling, go to the emergency department for further evaluation.

## 2017-08-30 NOTE — ED Provider Notes (Signed)
[place]    CSN: [id number] Arrival date & time: 08/30/17  1425     History   Chief Complaint Chief Complaint  Patient presents with  . URI    HPI Ann Foster is a 52 y.o. female.   52 year old female comes in for multiple complaints.  2-week history of URI symptoms.  Has had cough, rhinorrhea, nasal congestion, sinus pressure.  Denies fever, chills, night sweats.  States coughing is worse at night, does not cough as much during the day.  OTC cold medication without relief.  She quit smoking 3 weeks ago, has been smoking for about 30 years, 1.5 pack/day.  States started having right-sided facial pain today.  She has known cracked tooth on the right upper jaw, and wonders if the tooth is infected.  She made a dentist appointment, but will be in 2 weeks.  No obvious facial swelling.  Denies fever, chills, night sweats.  Has been taking ibuprofen without relief.     Past Medical History:  Diagnosis Date  . Bronchitis     There are no active problems to display for this patient.   History reviewed. No pertinent surgical history.  OB History   None      Home Medications    Prior to Admission medications   Medication Sig Start Date End Date Taking? Authorizing Provider  amoxicillin-clavulanate (AUGMENTIN) 875-125 MG tablet Take 1 tablet by mouth every 12 (twelve) hours. 08/30/17   Tasia Catchings, Ovetta Bazzano V, PA-C  diclofenac (VOLTAREN) 75 MG EC tablet Take 1 tablet (75 mg total) by mouth 2 (two) times daily. 08/08/17   Vanessa Kick, MD  HYDROcodone-acetaminophen (NORCO/VICODIN) 5-325 MG tablet Take 1 tablet by mouth every 6 (six) hours as needed for severe pain. 08/30/17   Tasia Catchings, Izora Benn V, PA-C  ipratropium (ATROVENT) 0.06 % nasal spray Place 2 sprays into both nostrils 4 (four) times daily. 08/30/17   Tasia Catchings, Denali Sharma V, PA-C  meloxicam (MOBIC) 7.5 MG tablet Take 1 tablet (7.5 mg total) by mouth daily. 08/30/17   Tasia Catchings, Eian Vandervelden V, PA-C  predniSONE (STERAPRED UNI-PAK 21 TAB) 10 MG (21) TBPK tablet Take  by mouth daily. Take as directed. 08/08/17   Vanessa Kick, MD    Family History History reviewed. No pertinent family history.  Social History Social History   Tobacco Use  . Smoking status: Current Every Day Smoker    Packs/day: 0.30    Types: Cigarettes  . Smokeless tobacco: Never Used  Substance Use Topics  . Alcohol use: Yes    Comment: once in a while  . Drug use: No     Allergies   Patient has no known allergies.   Review of Systems Review of Systems  Reason unable to perform ROS: See HPI as above.     Physical Exam Triage Vital Signs ED Triage Vitals [08/30/17 1508]  Enc Vitals Group     BP 138/89     Pulse Rate 93     Resp 18     Temp 98.1 F (36.7 C)     Temp Source Oral     SpO2 99 %     Weight      Height      Head Circumference      Peak Flow      Pain Score      Pain Loc      Pain Edu?      Excl. in [place]?    No data found.  Updated Vital Signs BP 138/89 (  BP Location: Right Arm)   Pulse 93   Temp 98.1 F (36.7 C) (Oral)   Resp 18   SpO2 99%   Physical Exam  Constitutional: She is oriented to person, place, and time. She appears well-developed and well-nourished. No distress.  HENT:  Head: Normocephalic and atraumatic.  Right Ear: External ear and ear canal normal. Tympanic membrane is not erythematous and not bulging.  Left Ear: External ear and ear canal normal. Tympanic membrane is not erythematous and not bulging.  Nose: Right sinus exhibits maxillary sinus tenderness and frontal sinus tenderness. Left sinus exhibits maxillary sinus tenderness and frontal sinus tenderness.  Mouth/Throat: Uvula is midline, oropharynx is clear and moist and mucous membranes are normal.  Bilateral TM opaque.  Tenderness to palpation along right upper gum.  No obvious fluctuance felt.  Dental caries and cracked tooth noted.  Floor of mouth soft to palpation.  No facial swelling.  Eyes: Pupils are equal, round, and reactive to light. Conjunctivae are  normal.  Neck: Normal range of motion. Neck supple.  Cardiovascular: Normal rate, regular rhythm and normal heart sounds. Exam reveals no gallop and no friction rub.  No murmur heard. Pulmonary/Chest: Effort normal and breath sounds normal. She has no decreased breath sounds. She has no wheezes. She has no rhonchi. She has no rales.  Lymphadenopathy:    She has no cervical adenopathy.  Neurological: She is alert and oriented to person, place, and time.  Skin: Skin is warm and dry.  Psychiatric: She has a normal mood and affect. Her behavior is normal. Judgment normal.     UC Treatments / Results  Labs (all labs ordered are listed, but only abnormal results are displayed) Labs Reviewed - No data to display  EKG None Radiology No results found.  Procedures Procedures (including critical care time)  Medications Ordered in UC Medications - No data to display   Initial Impression / Assessment and Plan / UC Course  I have reviewed the triage vital signs and the nursing notes.  Pertinent labs & imaging results that were available during my care of the patient were reviewed by me and considered in my medical decision making (see chart for details).    We will treat for sinusitis and possible dental infection with Augmentin.  Discussed gum tenderness could also be due to significant  sinus tenderness.  Other symptomatic treatment discussed.  Patient to follow-up with dentist as scheduled in 2 weeks for further evaluation.  Return precautions given.  Patient expresses understanding and agrees to plan.  Final Clinical Impressions(s) / UC Diagnoses   Final diagnoses:  Acute non-recurrent pansinusitis  Pain, dental    ED Discharge Orders        Ordered    amoxicillin-clavulanate (AUGMENTIN) 875-125 MG tablet  Every 12 hours     08/30/17 1605    ipratropium (ATROVENT) 0.06 % nasal spray  4 times daily     08/30/17 1605    meloxicam (MOBIC) 7.5 MG tablet  Daily     08/30/17 1605      HYDROcodone-acetaminophen (NORCO/VICODIN) 5-325 MG tablet  Every 6 hours PRN     08/30/17 1605       Controlled Substance Prescriptions [place] Controlled Substance Registry consulted? Yes, I have consulted the [place] Controlled Substances Registry for this patient, and feel the risk/benefit ratio today is favorable for proceeding with this prescription for a controlled substance.   Ok Edwards, PA-C 08/30/17 [phone]

## 2017-08-30 NOTE — ED Triage Notes (Signed)
Pt sts URI with sinus pressure; pt sts pain on right side of face

## 2017-09-18 ENCOUNTER — Emergency Department (HOSPITAL_COMMUNITY): Payer: 59

## 2017-09-18 ENCOUNTER — Emergency Department (HOSPITAL_COMMUNITY)
Admission: EM | Admit: 2017-09-18 | Discharge: 2017-09-18 | Disposition: A | Discharge status: Home / Self Care | Payer: 59 | Attending: Emergency Medicine | Admitting: Emergency Medicine

## 2017-09-18 ENCOUNTER — Encounter (HOSPITAL_COMMUNITY): Payer: Self-pay | Admitting: Emergency Medicine

## 2017-09-18 DIAGNOSIS — Y999 Unspecified external cause status: Secondary | ICD-10-CM | POA: Insufficient documentation

## 2017-09-18 DIAGNOSIS — Y939 Activity, unspecified: Secondary | ICD-10-CM | POA: Insufficient documentation

## 2017-09-18 DIAGNOSIS — F17210 Nicotine dependence, cigarettes, uncomplicated: Secondary | ICD-10-CM | POA: Diagnosis not present

## 2017-09-18 DIAGNOSIS — X58XXXA Exposure to other specified factors, initial encounter: Secondary | ICD-10-CM | POA: Diagnosis not present

## 2017-09-18 DIAGNOSIS — S4992XA Unspecified injury of left shoulder and upper arm, initial encounter: Secondary | ICD-10-CM | POA: Diagnosis present

## 2017-09-18 DIAGNOSIS — Y929 Unspecified place or not applicable: Secondary | ICD-10-CM | POA: Diagnosis not present

## 2017-09-18 DIAGNOSIS — S46012A Strain of muscle(s) and tendon(s) of the rotator cuff of left shoulder, initial encounter: Secondary | ICD-10-CM

## 2017-09-18 MED ORDER — TRAMADOL HCL 50 MG PO TABS: 50.0000 mg | ORAL_TABLET | Freq: Four times a day (QID) | ORAL | 0 refills | Status: DC | PRN

## 2017-09-18 NOTE — ED Triage Notes (Signed)
Pt reports L shoulder pain X several weeks, pt states pain is worse with movement. Has been to [facility] for same and dx with upper back strain.

## 2017-09-18 NOTE — ED Provider Notes (Signed)
[place] EMERGENCY DEPARTMENT Provider Note   CSN: [id number] Arrival date & time: 09/18/17  1814     History   Chief Complaint Chief Complaint  Patient presents with  . Shoulder Pain    HPI Ann Foster is a 52 y.o. female.  HPI   52 year old female presents today with complaints of left shoulder pain.  Patient reports proximate 1 week ago she started to develop pain in her left anterior shoulder.  She notes pain with range of motion, sharp in nature.  She also notes some minor anterior shoulder pain as well.  She denies any fever, swelling, redness, warmth, distal neurological deficits.  No trauma to the shoulder.  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  She reports she was seen at urgent care and prescribed steroids and diclofenac which did not significantly improve her symptoms.  She denies any chest pain.  Past Medical History:  Diagnosis Date  . Bronchitis     There are no active problems to display for this patient.   History reviewed. No pertinent surgical history.   OB History   None      Home Medications    Prior to Admission medications   Medication Sig Start Date End Date Taking? Authorizing Provider  amoxicillin-clavulanate (AUGMENTIN) 875-125 MG tablet Take 1 tablet by mouth every 12 (twelve) hours. 08/30/17   Tasia Catchings, Amy V, PA-C  diclofenac (VOLTAREN) 75 MG EC tablet Take 1 tablet (75 mg total) by mouth 2 (two) times daily. 08/08/17   Vanessa Kick, MD  HYDROcodone-acetaminophen (NORCO/VICODIN) 5-325 MG tablet Take 1 tablet by mouth every 6 (six) hours as needed for severe pain. 08/30/17   Tasia Catchings, Amy V, PA-C  ipratropium (ATROVENT) 0.06 % nasal spray Place 2 sprays into both nostrils 4 (four) times daily. 08/30/17   Tasia Catchings, Amy V, PA-C  meloxicam (MOBIC) 7.5 MG tablet Take 1 tablet (7.5 mg total) by mouth daily. 08/30/17   Tasia Catchings, Amy V, PA-C  predniSONE (STERAPRED UNI-PAK 21 TAB) 10 MG (21) TBPK tablet Take  by mouth daily. Take as directed. 08/08/17   Vanessa Kick, MD  traMADol (ULTRAM) 50 MG tablet Take 1 tablet (50 mg total) by mouth every 6 (six) hours as needed. 09/18/17   Okey Regal, PA-C    Family History No family history on file.  Social History Social History   Tobacco Use  . Smoking status: Current Some Day Smoker    Packs/day: 0.30    Types: Cigarettes  . Smokeless tobacco: Never Used  Substance Use Topics  . Alcohol use: Yes    Comment: Occasional  . Drug use: No     Allergies   Patient has no known allergies.   Review of Systems Review of Systems  All other systems reviewed and are negative.    Physical Exam Updated Vital Signs BP (!) 144/80 (BP Location: Right Arm)   Pulse 76   Temp 98.8 F (37.1 C) (Oral)   Resp 14   Ht 5\' 2"  (1.575 m)   Wt 99.8 kg (220 lb)   SpO2 100%   BMI 40.24 kg/m   Physical Exam  Constitutional: She is oriented to person, place, and time. She appears well-developed and well-nourished.  HENT:  Head: Normocephalic and atraumatic.  Eyes: Pupils are equal, round, and reactive to light. Conjunctivae are normal. Right eye exhibits no discharge. Left eye exhibits no discharge. No scleral icterus.  Neck: Normal range of motion.  No JVD present. No tracheal deviation present.  Pulmonary/Chest: Effort normal. No stridor.  Musculoskeletal:  Tenderness palpation of left anterior and posterior shoulder, no swelling, redness, or warmth to touch-full active range of motion with pain no crepitus, worsening pain with external rotation and forward flexion, grip strength 5 out of 5 sensation intact, radial pulse 2+  Neurological: She is alert and oriented to person, place, and time. Coordination normal.  Psychiatric: She has a normal mood and affect. Her behavior is normal. Judgment and thought content normal.  Nursing note and vitals reviewed.    ED Treatments / Results  Labs (all labs ordered are listed, but only abnormal results are  displayed) Labs Reviewed - No data to display  EKG None  Radiology Dg Shoulder Left  Result Date: 09/18/2017 CLINICAL DATA:  Worsening left shoulder pain for past couple of weeks EXAM: LEFT SHOULDER - 2+ VIEW COMPARISON:  None. FINDINGS: There is no evidence of acute fracture or dislocation. No suspicious osseous lesions. Moderate AC joint osteoarthritis with spurring and degenerative subcortical cystic change. Lesser degree of osteoarthritis of the glenohumeral joint. The adjacent ribs and lung are nonacute. Soft tissues are unremarkable. 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 No acute osseous abnormality. Electronically Signed   By: Ashley Royalty M.D.   On: 09/18/2017 21:03    Procedures Procedures (including critical care time)  Medications Ordered in ED Medications - No data to display   Initial Impression / Assessment and Plan / ED Course  I have reviewed the triage vital signs and the nursing notes.  Pertinent labs & imaging results that were available during my care of the patient were reviewed by me and considered in my medical decision making (see chart for details).     Labs:   Imaging: DG shoulder left  Consults:  Therapeutics:  Discharge Meds: ultram   Assessment/Plan: 52 year old female presents today with likely rotator cuff strain.  Plain films show osteoarthritis of the glenohumeral joints.  No signs of infectious etiology.  Patient will be given short course of Ultram, encouraged follow-up with orthopedics if symptoms persist return immediately if they worsen.  Patient verbalized understanding and agreement to today's plan had no further questions or concerns at time of discharge.   Final Clinical Impressions(s) / ED Diagnoses   Final diagnoses:  Strain of left rotator cuff capsule, initial encounter    ED Discharge Orders        Ordered    traMADol (ULTRAM) 50 MG tablet  Every 6 hours PRN     09/18/17 2124       Okey Regal, PA-C 09/18/17 2149    Pattricia Boss, MD 09/18/17 2200

## 2017-09-18 NOTE — Discharge Instructions (Addendum)
Please read attached information. If you experience any new or worsening signs or symptoms please return to the emergency room for evaluation. Please follow-up with your primary care provider or specialist as discussed. Please use medication prescribed only as directed and discontinue taking if you have any concerning signs or symptoms.   °

## 2017-12-22 ENCOUNTER — Encounter (HOSPITAL_COMMUNITY): Payer: Self-pay | Admitting: Emergency Medicine

## 2017-12-22 ENCOUNTER — Other Ambulatory Visit: Payer: Self-pay

## 2017-12-22 ENCOUNTER — Emergency Department (HOSPITAL_COMMUNITY): Payer: 59

## 2017-12-22 ENCOUNTER — Ambulatory Visit (HOSPITAL_COMMUNITY)
Admission: EM | Admit: 2017-12-22 | Discharge: 2017-12-22 | Disposition: A | Discharge status: Home / Self Care | Payer: 59 | Attending: Internal Medicine | Admitting: Internal Medicine

## 2017-12-22 ENCOUNTER — Encounter (HOSPITAL_COMMUNITY): Payer: Self-pay

## 2017-12-22 ENCOUNTER — Emergency Department (HOSPITAL_COMMUNITY)
Admission: EM | Admit: 2017-12-22 | Discharge: 2017-12-23 | Disposition: A | Discharge status: Home / Self Care | Payer: 59 | Attending: Emergency Medicine | Admitting: Emergency Medicine

## 2017-12-22 DIAGNOSIS — Z79899 Other long term (current) drug therapy: Secondary | ICD-10-CM | POA: Diagnosis not present

## 2017-12-22 DIAGNOSIS — K1121 Acute sialoadenitis: Secondary | ICD-10-CM | POA: Insufficient documentation

## 2017-12-22 DIAGNOSIS — K047 Periapical abscess without sinus: Secondary | ICD-10-CM

## 2017-12-22 DIAGNOSIS — J039 Acute tonsillitis, unspecified: Secondary | ICD-10-CM

## 2017-12-22 DIAGNOSIS — F17210 Nicotine dependence, cigarettes, uncomplicated: Secondary | ICD-10-CM | POA: Diagnosis not present

## 2017-12-22 DIAGNOSIS — K0889 Other specified disorders of teeth and supporting structures: Secondary | ICD-10-CM

## 2017-12-22 LAB — CBC
HCT: 41.8 % (ref 36.0–46.0)
HEMOGLOBIN: 13.4 g/dL (ref 12.0–15.0)
MCH: 30.9 pg (ref 26.0–34.0)
MCHC: 32.1 g/dL (ref 30.0–36.0)
MCV: 96.3 fL (ref 78.0–100.0)
PLATELETS: 317 10*3/uL (ref 150–400)
RBC: 4.34 MIL/uL (ref 3.87–5.11)
RDW: 12.1 % (ref 11.5–15.5)
WBC: 12.8 10*3/uL — ABNORMAL HIGH (ref 4.0–10.5)

## 2017-12-22 LAB — BASIC METABOLIC PANEL
ANION GAP: 11 (ref 5–15)
BUN: 8 mg/dL (ref 6–20)
CALCIUM: 9.6 mg/dL (ref 8.9–10.3)
CHLORIDE: 102 mmol/L (ref 98–111)
CO2: 25 mmol/L (ref 22–32)
CREATININE: 0.62 mg/dL (ref 0.44–1.00)
GLUCOSE: 191 mg/dL — AB (ref 70–99)
Potassium: 3.4 mmol/L — ABNORMAL LOW (ref 3.5–5.1)
SODIUM: 138 mmol/L (ref 135–145)

## 2017-12-22 MED ORDER — CLINDAMYCIN HCL 150 MG PO CAPS: 150.0000 mg | ORAL_CAPSULE | Freq: Four times a day (QID) | ORAL | 0 refills | Status: DC

## 2017-12-22 MED ORDER — CEFTRIAXONE SODIUM 1 G IJ SOLR
1.0000 g | Freq: Once | INTRAMUSCULAR | Status: AC
  Administered 2017-12-22: 1 g via INTRAMUSCULAR

## 2017-12-22 MED ORDER — KETOROLAC TROMETHAMINE 60 MG/2ML IM SOLN
INTRAMUSCULAR | Status: AC
  Filled 2017-12-22: qty 2

## 2017-12-22 MED ORDER — IOHEXOL 300 MG/ML  SOLN
100.0000 mL | Freq: Once | INTRAMUSCULAR | Status: AC | PRN
  Administered 2017-12-22: 100 mL via INTRAVENOUS

## 2017-12-22 MED ORDER — DEXAMETHASONE 4 MG PO TABS
6.0000 mg | ORAL_TABLET | Freq: Once | ORAL | Status: AC
  Administered 2017-12-22: 6 mg via ORAL
  Filled 2017-12-22: qty 2

## 2017-12-22 MED ORDER — KETOROLAC TROMETHAMINE 60 MG/2ML IM SOLN
60.0000 mg | Freq: Once | INTRAMUSCULAR | Status: AC
  Administered 2017-12-22: 60 mg via INTRAMUSCULAR

## 2017-12-22 MED ORDER — CEFTRIAXONE SODIUM 1 G IJ SOLR
INTRAMUSCULAR | Status: AC
  Filled 2017-12-22: qty 10

## 2017-12-22 MED ORDER — PENICILLIN V POTASSIUM 500 MG PO TABS: 500.0000 mg | ORAL_TABLET | Freq: Three times a day (TID) | ORAL | 0 refills | Status: DC

## 2017-12-22 NOTE — ED Triage Notes (Signed)
Pt presents with complaints of sore throat and ear ache on the left side with swelling present. Pt voice is muffled. Breathing is unlabored.

## 2017-12-22 NOTE — ED Triage Notes (Signed)
Pt seen yesterday at [facility] and treated with rocephin for suspected dental infection. Presents today with a peritonsilar abscess to the left. And reports rouble swallowing.

## 2017-12-22 NOTE — ED Provider Notes (Signed)
Patient placed in Quick Look pathway, seen and evaluated   Chief Complaint: Sore throat, trouble swallowing  HPI: Patient presents with 2-day history of sore throat and trouble swallowing.  She notes a muffled voice as well.  She had some mild cold symptoms prior to this.  No fevers.  She was seen at urgent care and was treated with Rocephin and Toradol.  She was sent to the emergency department for further evaluation and possible imaging.  Patient reports having a broken molar on the left side.  She has some tenderness in her left neck as well.  ROS:  Positive ROS: (+) Sore throat, voice change Negative ROS: (-) Fever  Physical Exam:   Gen: No distress  Neuro: Awake and Alert  Skin: Warm    Focused Exam: ENT large left-sided peritonsillar abscess noted; Heart RRR, nml S1,S2, no m/r/g; Lungs CTAB; Abd soft, NT, no rebound or guarding; MSK full range of motion of neck without significant pain.  BP (!) 150/79 (BP Location: Right Arm)   Pulse 89   Temp 98.7 F (37.1 C) (Oral)   Resp 18   Ht 5\' 3"  (1.6 m)   Wt 97.5 kg (215 lb)   SpO2 100%   BMI 38.09 kg/m   Plan: We will check basic labs.  I have visualized large left-sided peritonsillar abscess on exam.  Will defer imaging to next provider.  Initiation of care has begun. The patient has been counseled on the process, plan, and necessity for staying for the completion/evaluation, and the remainder of the medical screening examination    Carlisle Cater, Hershal Coria 12/22/17 1736    Lajean Saver, MD 12/22/17 1759

## 2017-12-22 NOTE — Discharge Instructions (Addendum)
Please go to the emergency room now for further evaluation (including possible advanced imaging) of severe dental infection with swelling in the roof of your mouth and left throat.  Injection of Rocephin 1 g was given at the urgent care, with injection of ketorolac (anti inflammatory/pain reliever) for pain.

## 2017-12-22 NOTE — ED Provider Notes (Addendum)
[place] EMERGENCY DEPARTMENT Provider Note   CSN: [id number] Arrival date & time: 12/22/17  1701     History   Chief Complaint Chief Complaint  Patient presents with  . Peritonsilar Abscess    HPI Ann Foster is a 52 y.o. female.  HPI  52 year old female who presents today complaining of left-sided mouth pain.  Pain began 2 days ago while she was eating.  It worsens when she is eating.  She has noted some subjective swelling in front of her left ear down to the angle of the left mandible.  She does have some poor dentition and has had some ongoing pain but has not noted any new acute problems in her mouth.  She was seen at urgent care center and given Rocephin for possible dental abscess.  She was sent here as I felt that there was swelling that might need to be imaged.  Patient reports some pain with swallowing but this appears to be more in the face.  She denies any difficulty speaking or breathing.  She has had no similar problems in the past.  She has not had any fever or redness.  She has no significant past medical history and is not taking any medications.  Past Medical History:  Diagnosis Date  . Bronchitis     There are no active problems to display for this patient.   History reviewed. No pertinent surgical history.   OB History   None      Home Medications    Prior to Admission medications   Medication Sig Start Date End Date Taking? Authorizing Provider  amoxicillin-clavulanate (AUGMENTIN) 875-125 MG tablet Take 1 tablet by mouth every 12 (twelve) hours. 08/30/17   Tasia Catchings, Amy V, PA-C  diclofenac (VOLTAREN) 75 MG EC tablet Take 1 tablet (75 mg total) by mouth 2 (two) times daily. 08/08/17   Vanessa Kick, MD  HYDROcodone-acetaminophen (NORCO/VICODIN) 5-325 MG tablet Take 1 tablet by mouth every 6 (six) hours as needed for severe pain. 08/30/17   Tasia Catchings, Amy V, PA-C  ipratropium (ATROVENT) 0.06 % nasal spray Place 2 sprays into both nostrils 4 (four)  times daily. 08/30/17   Tasia Catchings, Amy V, PA-C  meloxicam (MOBIC) 7.5 MG tablet Take 1 tablet (7.5 mg total) by mouth daily. 08/30/17   Tasia Catchings, Amy V, PA-C  predniSONE (STERAPRED UNI-PAK 21 TAB) 10 MG (21) TBPK tablet Take by mouth daily. Take as directed. 08/08/17   Vanessa Kick, MD  traMADol (ULTRAM) 50 MG tablet Take 1 tablet (50 mg total) by mouth every 6 (six) hours as needed. 09/18/17   Okey Regal, PA-C    Family History Family History  Problem Relation Age of Onset  . Cancer Mother   . Cancer Father     Social History Social History   Tobacco Use  . Smoking status: Current Some Day Smoker    Packs/day: 0.30    Types: Cigarettes  . Smokeless tobacco: Never Used  Substance Use Topics  . Alcohol use: Yes    Comment: Occasional  . Drug use: No     Allergies   Patient has no known allergies.   Review of Systems Review of Systems  All other systems reviewed and are negative.    Physical Exam Updated Vital Signs BP 127/80 (BP Location: Right Arm)   Pulse 74   Temp 98.7 F (37.1 C) (Oral)   Resp 16   Ht 1.6 m (5\' 3" )   Wt 97.5 kg (215 lb)  SpO2 100%   BMI 38.09 kg/m   Physical Exam  Constitutional: She appears well-developed and well-nourished.  HENT:  Head: Normocephalic and atraumatic.  Right Ear: External ear normal.  Left Ear: External ear normal.  Nose: Nose normal.  Mouth/Throat: Oropharynx is clear and moist.  Tenderness to palpation in front of left ear without any fluctuance or erythema noted.  Stensen's duct is visible and no pus is milked No submandibular swelling or tenderness. Prominent Bilateral tonsillar pillar left greater than right-   Eyes: Pupils are equal, round, and reactive to light. EOM are normal.  Neck: Normal range of motion. Neck supple.  No swelling is noted of the neck  Cardiovascular: Normal rate and regular rhythm.  Pulmonary/Chest: Effort normal.  Abdominal: Soft.  Musculoskeletal: Normal range of motion.  Neurological: She is  alert.  Skin: Skin is warm.  Psychiatric: She has a normal mood and affect.  Nursing note and vitals reviewed.    ED Treatments / Results  Labs (all labs ordered are listed, but only abnormal results are displayed) Labs Reviewed  CBC - Abnormal; Notable for the following components:      Result Value   WBC 12.8 (*)    All other components within normal limits  BASIC METABOLIC PANEL - Abnormal; Notable for the following components:   Potassium 3.4 (*)    Glucose, Bld 191 (*)    All other components within normal limits    EKG None  Radiology No results found.  Procedures Procedures (including critical care time)  Medications Ordered in ED Medications - No data to display   Initial Impression / Assessment and Plan / ED Course  I have reviewed the triage vital signs and the nursing notes.  Pertinent labs & imaging results that were available during my care of the patient were reviewed by me and considered in my medical decision making (see chart for details).   Patient with some peritonsillar prominence left greater than right- ct obtained- Patient has clear airway and tenderness consistent with sialoadenitis.  She does not have any indication for imaging at this time.  She has received Rocephin at urgent care.  Plan prescription for clindamycin.  Patient will use lemon drops or other means to reduce increased saliva.  We have discussed return precautions including increased swelling, difficulty swallowing or speaking.  And need for close follow-up and she voices understanding.  Final Clinical Impressions(s) / ED Diagnoses   Final diagnoses:  Sialadenitis    ED Discharge Orders    None       Pattricia Boss, MD 12/22/17 2058    Pattricia Boss, MD 12/22/17 [phone]

## 2017-12-22 NOTE — Discharge Instructions (Addendum)
Please have your penicillin filled tomorrow and take as instructed. Return if you are having worsening difficulty swallowing or breathing. Recheck with your doctor next week

## 2017-12-22 NOTE — ED Provider Notes (Signed)
[place]    CSN: [id number] Arrival date & time: 12/22/17  1543     History   Chief Complaint Left facial pain  HPI Ann Foster is a 52 y.o. female.   She presents today with history of broken off molar in the left upper jaw.  In the last 24 hours, she has developed severe pain and swelling around this tooth that is now radiating into the left ear and into the throat.  Left cheek is swollen.  She is not able to swallow without severe pain.  She has had change in her voice quality.  No fever, no malaise.  No runny/congested nose, no change in chronic mild cough.    HPI  Past Medical History:  Diagnosis Date  . Bronchitis     History reviewed. No pertinent surgical history.   Home Medications    Prior to Admission medications   Medication Sig Start Date End Date Taking? Authorizing Provider  amoxicillin-clavulanate (AUGMENTIN) 875-125 MG tablet Take 1 tablet by mouth every 12 (twelve) hours. 08/30/17   Tasia Catchings, Amy V, PA-C  diclofenac (VOLTAREN) 75 MG EC tablet Take 1 tablet (75 mg total) by mouth 2 (two) times daily. 08/08/17   Vanessa Kick, MD  HYDROcodone-acetaminophen (NORCO/VICODIN) 5-325 MG tablet Take 1 tablet by mouth every 6 (six) hours as needed for severe pain. 08/30/17   Tasia Catchings, Amy V, PA-C  ipratropium (ATROVENT) 0.06 % nasal spray Place 2 sprays into both nostrils 4 (four) times daily. 08/30/17   Tasia Catchings, Amy V, PA-C  meloxicam (MOBIC) 7.5 MG tablet Take 1 tablet (7.5 mg total) by mouth daily. 08/30/17   Tasia Catchings, Amy V, PA-C  predniSONE (STERAPRED UNI-PAK 21 TAB) 10 MG (21) TBPK tablet Take by mouth daily. Take as directed. 08/08/17   Vanessa Kick, MD  traMADol (ULTRAM) 50 MG tablet Take 1 tablet (50 mg total) by mouth every 6 (six) hours as needed. 09/18/17   Okey Regal, PA-C    Family History Family History  Problem Relation Age of Onset  . Cancer Mother   . Cancer Father     Social History Social History   Tobacco Use  . Smoking status: Current Some Day  Smoker    Packs/day: 0.30    Types: Cigarettes  . Smokeless tobacco: Never Used  Substance Use Topics  . Alcohol use: Yes    Comment: Occasional  . Drug use: No     Allergies   Patient has no known allergies.   Review of Systems Review of Systems  All other systems reviewed and are negative.    Physical Exam Triage Vital Signs ED Triage Vitals  Enc Vitals Group     BP 12/22/17 1552 (!) 144/87     Pulse Rate 12/22/17 1552 87     Resp 12/22/17 1552 14     Temp 12/22/17 1552 98.4 F (36.9 C)     Temp Source 12/22/17 1552 Oral     SpO2 12/22/17 1552 97 %     Weight --      Height --      Pain Score 12/22/17 1556 10     Pain Loc --    Updated Vital Signs BP (!) 144/87 (BP Location: Left Arm)   Pulse 87   Temp 98.4 F (36.9 C) (Oral)   Resp 14   SpO2 97%    Physical Exam  Constitutional: She is oriented to person, place, and time.  Patient looks apprehensive Voice quality is quite muffled, patient notes  that this is a change  HENT:  Head: Atraumatic.  Marked swelling and erythema of the left tonsillar area and soft palate.  Patient has moderate trismus of the left jaw, and a broken left upper molar is evident. There is brawny edema palpable in the L>R anterior cervical area  Eyes:  Conjugate gaze observed, no eye redness/discharge  Neck: Neck supple.  Cardiovascular: Normal rate.  Pulmonary/Chest: No respiratory distress.  Abdominal: She exhibits no distension.  Musculoskeletal: Normal range of motion.  Neurological: She is alert and oriented to person, place, and time.  Skin: Skin is warm and dry.  Nursing note and vitals reviewed.    UC Treatments / Results   Procedures Procedures (including critical care time)  Medications Ordered in UC Medications  ketorolac (TORADOL) injection 60 mg (has no administration in time range)  cefTRIAXone (ROCEPHIN) injection 1 g (1 g Intramuscular Given 12/22/17 1619)    Final Clinical Impressions(s) / UC Diagnoses    Final diagnoses:  Dental infection     Discharge Instructions     Please go to the emergency room now for further evaluation (including possible advanced imaging) of severe dental infection with swelling in the roof of your mouth and left throat.  Injection of Rocephin 1 g was given at the urgent care, with injection of ketorolac (anti inflammatory/pain reliever) for pain.       Wynona Luna, MD 12/22/17 1630

## 2018-01-03 ENCOUNTER — Other Ambulatory Visit: Payer: Self-pay | Admitting: Orthopedic Surgery

## 2018-01-19 ENCOUNTER — Encounter (HOSPITAL_BASED_OUTPATIENT_CLINIC_OR_DEPARTMENT_OTHER): Payer: Self-pay

## 2018-01-22 ENCOUNTER — Encounter (HOSPITAL_BASED_OUTPATIENT_CLINIC_OR_DEPARTMENT_OTHER): Payer: Self-pay | Admitting: *Deleted

## 2018-01-22 ENCOUNTER — Other Ambulatory Visit: Payer: Self-pay

## 2018-01-22 NOTE — Progress Notes (Addendum)
Spoke with Terrilyn Npo after midnight arrive 1000 am [facility] 01-26-18 Records on chart/epic: ekg 09-18-17, cbc/bmet 12-22-17 epic (labs too old), neck ct with contrast 12-22-17, chest 2 view xray 01-29-17  Driver daughter crystal Facey will stay for surgery Needs repeat ekg day of surgery per dr Marcello Moores brock anesthsia, no pre op labs needed per dr brock. Surgery orders in epic

## 2018-01-22 NOTE — Progress Notes (Signed)
Spoke with dr brock anesthesia and made aware patient medical history and ekg 09-18-17 ekg results, order received from dr brock to repeat ekg day of surgery and no lab work needed for patient day of surgery 01-26-18 per dr brock.

## 2018-01-23 DIAGNOSIS — M7542 Impingement syndrome of left shoulder: Secondary | ICD-10-CM | POA: Diagnosis present

## 2018-01-23 NOTE — H&P (Signed)
Ann Foster is an 52 y.o. female.   Chief Complaint: Left Shoulder pain  HPI: Ms. Ann Foster returns with continued significant pain in her left shoulder along the anterior superior aspect.  Recall that she had a cortisone injection for/25/19, that provided 1 week worth of excellent pain relief and then her pain returned.  Because of some rotator cuff weakness.  We did obtain an MRI scan that showed rotator cuff tendinitis, but no evidence of a rotator cuff tear.  There are also significant degenerative changes in the [facility] joint.  On the MRI scan.  I can easily see a significant subacromial spur.  The x-rays were done at the hospital did not include a spur view.  Past Medical History:  Diagnosis Date  . Ankle fracture 2016   Right  . Aortic atherosclerosis (HCC)    trace calcific atherosclerosis aortic arch per ct neck done 12-22-17  . Bronchitis   . OA (osteoarthritis) of shoulder    left shoulder, both knees arthritis    Past Surgical History:  Procedure Laterality Date  . NO PAST SURGERIES      Family History  Problem Relation Age of Onset  . Cancer Mother   . Cancer Father    Social History:  reports that she has been smoking cigarettes. She has been smoking about 0.30 packs per day. She has never used smokeless tobacco. She reports that she drinks alcohol. She reports that she does not use drugs.  Allergies: No Known Allergies  No medications prior to admission.    No results found for this or any previous visit (from the past 48 hour(s)). No results found.  Review of Systems  Constitutional: Negative.   HENT: Negative.   Eyes: Negative.   Respiratory: Negative.   Cardiovascular: Negative.   Gastrointestinal: Negative.   Genitourinary: Negative.   Musculoskeletal: Positive for joint pain.  Skin: Negative.   Neurological: Negative.   Endo/Heme/Allergies: Negative.   Psychiatric/Behavioral: Negative.     Height 5\' 3"  (1.6 m), weight 99.8 kg. Physical Exam   Constitutional: She is oriented to person, place, and time. She appears well-developed and well-nourished.  HENT:  Head: Normocephalic and atraumatic.  Eyes: Pupils are equal, round, and reactive to light.  Neck: Normal range of motion. Neck supple.  Cardiovascular: Intact distal pulses.  Respiratory: Effort normal.  Musculoskeletal: She exhibits tenderness.  Neurological: She is alert and oriented to person, place, and time.  Skin: Skin is warm and dry.  Psychiatric: She has a normal mood and affect. Her behavior is normal. Judgment and thought content normal.     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  Having failed conservative treatment, but good pain relief temporarily with a cortisone injection  Plan:  Risks and benefits of left shoulder arthroscopic evaluation treatment to include distal clavicle excision, acromioplasty were discussed with the patient she would like to proceed sooner than later.  We will get this set up for her at the [facility].  Joanell Rising, PA-C 01/23/2018, 2:55 PM

## 2018-01-26 ENCOUNTER — Ambulatory Visit (HOSPITAL_BASED_OUTPATIENT_CLINIC_OR_DEPARTMENT_OTHER)
Admission: RE | Admit: 2018-01-26 | Payer: Managed Care, Other (non HMO) | Source: Ambulatory Visit | Admitting: Orthopedic Surgery

## 2018-01-26 HISTORY — DX: Primary osteoarthritis, unspecified shoulder: M19.019

## 2018-01-26 HISTORY — DX: Atherosclerosis of aorta: I70.0

## 2018-01-26 HISTORY — DX: Other fracture of unspecified lower leg, initial encounter for closed fracture: S82.899A

## 2018-01-26 SURGERY — ARTHROSCOPY, SHOULDER
Anesthesia: General | Site: Shoulder | Laterality: Left

## 2018-03-09 ENCOUNTER — Other Ambulatory Visit: Payer: Self-pay

## 2018-03-09 ENCOUNTER — Encounter (HOSPITAL_COMMUNITY): Payer: Self-pay

## 2018-03-09 ENCOUNTER — Emergency Department (HOSPITAL_COMMUNITY)
Admission: EM | Admit: 2018-03-09 | Discharge: 2018-03-09 | Disposition: A | Discharge status: Home / Self Care | Payer: Self-pay | Attending: Emergency Medicine | Admitting: Emergency Medicine

## 2018-03-09 ENCOUNTER — Emergency Department (HOSPITAL_COMMUNITY): Payer: Self-pay

## 2018-03-09 DIAGNOSIS — F17210 Nicotine dependence, cigarettes, uncomplicated: Secondary | ICD-10-CM | POA: Insufficient documentation

## 2018-03-09 DIAGNOSIS — Z79899 Other long term (current) drug therapy: Secondary | ICD-10-CM | POA: Insufficient documentation

## 2018-03-09 DIAGNOSIS — L03011 Cellulitis of right finger: Secondary | ICD-10-CM | POA: Insufficient documentation

## 2018-03-09 MED ORDER — CLINDAMYCIN HCL 150 MG PO CAPS: 300.0000 mg | ORAL_CAPSULE | Freq: Three times a day (TID) | ORAL | 0 refills | Status: DC

## 2018-03-09 MED ORDER — TRAMADOL HCL 50 MG PO TABS: 50.0000 mg | ORAL_TABLET | Freq: Four times a day (QID) | ORAL | 0 refills | Status: DC | PRN

## 2018-03-09 NOTE — ED Provider Notes (Signed)
[facility] EMERGENCY DEPARTMENT Provider Note   CSN: [id number] Arrival date & time: 03/09/18  1818     History   Chief Complaint Chief Complaint  Patient presents with  . Hand Pain    HPI Ann Foster is a 52 y.o. female presents the emergency department chief complaint of right hand pain.  Patient states that before bed her right index finger became itchy.  When she woke this morning she noticed that there was something that looked like a bug bite but had come painful.  Over the course of the day she had worsening heat redness pain and swelling of the right index finger swelling over the second and third MCP P joints and red streaking up the dorsum of her right hand.  She denies fevers, chills, painful joint, numbness or tingling.  HPI  Past Medical History:  Diagnosis Date  . Ankle fracture 2016   Right  . Aortic atherosclerosis (HCC)    trace calcific atherosclerosis aortic arch per ct neck done 12-22-17  . Bronchitis   . OA (osteoarthritis) of shoulder    left shoulder, both knees arthritis    Patient Active Problem List   Diagnosis Date Noted  . Impingement syndrome of left shoulder 01/23/2018    Past Surgical History:  Procedure Laterality Date  . NO PAST SURGERIES       OB History   None      Home Medications    Prior to Admission medications   Medication Sig Start Date End Date Taking? Authorizing Provider  diclofenac (VOLTAREN) 75 MG EC tablet Take 1 tablet (75 mg total) by mouth 2 (two) times daily. Patient not taking: Reported on 12/22/2017 08/08/17   Vanessa Kick, MD  HYDROcodone-acetaminophen (NORCO/VICODIN) 5-325 MG tablet Take 1 tablet by mouth every 6 (six) hours as needed for severe pain. Patient not taking: Reported on 12/22/2017 08/30/17   Ok Edwards, PA-C  ibuprofen (ADVIL,MOTRIN) 200 MG tablet Take 400-600 mg by mouth every 6 (six) hours as needed for headache (pain).    [provider]  ipratropium (ATROVENT) 0.06 %  nasal spray Place 2 sprays into both nostrils 4 (four) times daily. Patient not taking: Reported on 12/22/2017 08/30/17   Ok Edwards, PA-C  meloxicam (MOBIC) 7.5 MG tablet Take 1 tablet (7.5 mg total) by mouth daily. Patient not taking: Reported on 12/22/2017 08/30/17   Ok Edwards, PA-C  penicillin v potassium (VEETID) 500 MG tablet Take 1 tablet (500 mg total) by mouth 3 (three) times daily. 12/22/17   Pattricia Boss, MD  traMADol (ULTRAM) 50 MG tablet Take 1 tablet (50 mg total) by mouth every 6 (six) hours as needed. Patient not taking: Reported on 12/22/2017 09/18/17   Okey Regal, PA-C    Family History Family History  Problem Relation Age of Onset  . Cancer Mother   . Cancer Father     Social History Social History   Tobacco Use  . Smoking status: Current Some Day Smoker    Packs/day: 0.30    Types: Cigarettes  . Smokeless tobacco: Never Used  . Tobacco comment: stopped 2 months ago, smokes 1-2 cigarettes day now  Substance Use Topics  . Alcohol use: Yes    Comment: Occasional  . Drug use: No     Allergies   Patient has no known allergies.   Review of Systems Review of Systems Ten systems reviewed and are negative for acute change, except as noted in the HPI.  Physical Exam Updated Vital Signs BP 130/80 (BP Location: Left Arm)   Pulse 97   Temp 99.1 F (37.3 C) (Oral)   Resp 16   SpO2 100%   Physical Exam  Constitutional: She is oriented to person, place, and time. She appears well-developed and well-nourished. No distress.  HENT:  Head: Normocephalic and atraumatic.  Eyes: Conjunctivae are normal. No scleral icterus.  Neck: Normal range of motion.  Cardiovascular: Normal rate, regular rhythm and normal heart sounds. Exam reveals no gallop and no friction rub.  No murmur heard. Pulmonary/Chest: Effort normal and breath sounds normal. No respiratory distress.  Abdominal: Soft. Bowel sounds are normal. She exhibits no distension and no mass. There is no  tenderness. There is no guarding.  Musculoskeletal: She exhibits edema and tenderness.  Small tender vesicular lesion over the dorsum of the right index finger between the MCP and PIP joint.  There is heat, tenderness and erythema along with swelling over the second and third MCP with lymphangitic streaking up the dorsum of the hand.  No pain with motion of the DIP PIP MCP or wrist.  Normal pulse and sensation  Neurological: She is alert and oriented to person, place, and time.  Skin: Skin is warm and dry. She is not diaphoretic.  Psychiatric: Her behavior is normal.  Nursing note and vitals reviewed.    ED Treatments / Results  Labs (all labs ordered are listed, but only abnormal results are displayed) Labs Reviewed - No data to display  EKG None  Radiology Dg Hand Complete Right  Result Date: 03/09/2018 CLINICAL DATA:  Patient with right hand pain. Swelling around the first digit. No known injury. Initial encounter. EXAM: RIGHT HAND - COMPLETE 3+ VIEW COMPARISON:  None. FINDINGS: Normal anatomic alignment. No evidence for acute fracture or dislocation. Regional soft tissues are unremarkable. First MCP joint degenerative changes. IMPRESSION: No acute osseous abnormality. First MCP joint degenerative changes. Electronically Signed   By: Lovey Newcomer M.D.   On: 03/09/2018 18:59    Procedures Procedures (including critical care time)  Medications Ordered in ED Medications - No data to display   Initial Impression / Assessment and Plan / ED Course  I have reviewed the triage vital signs and the nursing notes.  Pertinent labs & imaging results that were available during my care of the patient were reviewed by me and considered in my medical decision making (see chart for details).    Patient has cellulitis of the hand.  She will be placed on clindamycin and pain medicine. No evidence of infective tenosynovitis.  Patient has full range of motion of the joints.  Placed in splint for  comfort, discharged with clindamycin and tramadol.  I discussed return precautions.  She is advised to follow-up in 2 days for recheck.  She appears appropriate for discharge at this time  Final Clinical Impressions(s) / ED Diagnoses   Final diagnoses:  Cellulitis of finger of right hand    ED Discharge Orders    None       Margarita Mail, PA-C 03/09/18 2034    Lennice Sites, DO 03/10/18 2355

## 2018-03-09 NOTE — ED Triage Notes (Signed)
Pt presents for R hand pain starting today. Denies recent injury or hx of similar. Pain and swelling to joint of first finger. Pt has some redness.

## 2018-03-09 NOTE — Discharge Instructions (Addendum)
Contact a health care provider if: You have a fever. Your symptoms do not improve within 1-2 days of starting treatment. Your bone or joint underneath the infected area becomes painful after the skin has healed. Your infection returns in the same area or another area. You notice a swollen bump in the infected area. You develop new symptoms. You have a general ill feeling (malaise) with muscle aches and pains. Get help right away if: Your symptoms get worse. You feel very sleepy. You develop vomiting or diarrhea that persists. You notice red streaks coming from the infected area. Your red area gets larger or turns dark in color.

## 2018-05-24 ENCOUNTER — Ambulatory Visit (HOSPITAL_COMMUNITY)
Admission: EM | Admit: 2018-05-24 | Discharge: 2018-05-24 | Disposition: A | Discharge status: Home / Self Care | Payer: Self-pay | Attending: Family Medicine | Admitting: Family Medicine

## 2018-05-24 ENCOUNTER — Encounter (HOSPITAL_COMMUNITY): Payer: Self-pay | Admitting: Emergency Medicine

## 2018-05-24 DIAGNOSIS — F17210 Nicotine dependence, cigarettes, uncomplicated: Secondary | ICD-10-CM | POA: Insufficient documentation

## 2018-05-24 DIAGNOSIS — B9689 Other specified bacterial agents as the cause of diseases classified elsewhere: Secondary | ICD-10-CM | POA: Insufficient documentation

## 2018-05-24 DIAGNOSIS — M170 Bilateral primary osteoarthritis of knee: Secondary | ICD-10-CM | POA: Insufficient documentation

## 2018-05-24 DIAGNOSIS — N3090 Cystitis, unspecified without hematuria: Secondary | ICD-10-CM | POA: Insufficient documentation

## 2018-05-24 DIAGNOSIS — I700 Atherosclerosis of aorta: Secondary | ICD-10-CM | POA: Insufficient documentation

## 2018-05-24 DIAGNOSIS — Z780 Asymptomatic menopausal state: Secondary | ICD-10-CM | POA: Insufficient documentation

## 2018-05-24 DIAGNOSIS — M19012 Primary osteoarthritis, left shoulder: Secondary | ICD-10-CM | POA: Insufficient documentation

## 2018-05-24 LAB — POCT URINALYSIS DIP (DEVICE)
GLUCOSE, UA: 100 mg/dL — AB
Hgb urine dipstick: NEGATIVE
Nitrite: NEGATIVE
Protein, ur: 30 mg/dL — AB
Specific Gravity, Urine: 1.025 (ref 1.005–1.030)
UROBILINOGEN UA: 1 mg/dL (ref 0.0–1.0)
pH: 6 (ref 5.0–8.0)

## 2018-05-24 MED ORDER — PHENAZOPYRIDINE HCL 200 MG PO TABS: 200.0000 mg | ORAL_TABLET | Freq: Three times a day (TID) | ORAL | 0 refills | Status: DC

## 2018-05-24 MED ORDER — CEPHALEXIN 500 MG PO CAPS: 500.0000 mg | ORAL_CAPSULE | Freq: Two times a day (BID) | ORAL | 0 refills | Status: DC

## 2018-05-24 NOTE — ED Provider Notes (Signed)
Ann Foster    ASSESSMENT & PLAN:  1. Cystitis     Meds ordered this encounter  Medications  . cephALEXin (KEFLEX) 500 MG capsule    Sig: Take 1 capsule (500 mg total) by mouth 2 (two) times daily.    Dispense:  10 capsule    Refill:  0  . phenazopyridine (PYRIDIUM) 200 MG tablet    Sig: Take 1 tablet (200 mg total) by mouth 3 (three) times daily.    Dispense:  6 tablet    Refill:  0    Urine culture sent. Will notify patient when results available. Will follow up with her PCP or here if not showing improvement over the next 48 hours, sooner if needed.  Outlined signs and symptoms indicating need for more acute intervention. Patient verbalized understanding. After Visit Summary given.  SUBJECTIVE:  Ann Foster is a 52 y.o. female who complains of urinary frequency, urgency and dysuria for the past 1 day. No flank pain, fever, chills, abnormal vaginal discharge or bleeding. Hematuria: not present. Normal PO intake. No abdominal pain. No self treatment. Ambulatory without difficulty.  LMP: No LMP recorded. Patient is postmenopausal.  ROS: As in HPI.  OBJECTIVE:  Vitals:   05/24/18 1435  BP: (!) 149/77  Pulse: 83  Resp: 16  Temp: 98.7 F (37.1 C)  TempSrc: Oral  SpO2: 98%   Appears well, in no apparent distress. Abdomen is soft without tenderness, guarding, mass, rebound or organomegaly. No CVA tenderness or inguinal adenopathy noted.  Labs Reviewed  POCT URINALYSIS DIP (DEVICE) - Abnormal; Notable for the following components:      Result Value   Glucose, UA 100 (*)    Bilirubin Urine SMALL (*)    Ketones, ur TRACE (*)    Protein, ur 30 (*)    Leukocytes, UA TRACE (*)    All other components within normal limits  URINE CULTURE    No Known Allergies  Past Medical History:  Diagnosis Date  . Ankle fracture 2016   Right  . Aortic atherosclerosis (HCC)    trace calcific atherosclerosis aortic arch per ct neck done 12-22-17  . Bronchitis   .  OA (osteoarthritis) of shoulder    left shoulder, both knees arthritis   Social History   Socioeconomic History  . Marital status: Single    Spouse name: Not on file  . Number of children: Not on file  . Years of education: Not on file  . Highest education level: Not on file  Occupational History  . Not on file  Social Needs  . Financial resource strain: Not on file  . Food insecurity:    Worry: Not on file    Inability: Not on file  . Transportation needs:    Medical: Not on file    Non-medical: Not on file  Tobacco Use  . Smoking status: Current Some Day Smoker    Packs/day: 0.30    Types: Cigarettes  . Smokeless tobacco: Never Used  . Tobacco comment: stopped 2 months ago, smokes 1-2 cigarettes day now  Substance and Sexual Activity  . Alcohol use: Yes    Comment: Occasional  . Drug use: No  . Sexual activity: Not on file  Lifestyle  . Physical activity:    Days per week: Not on file    Minutes per session: Not on file  . Stress: Not on file  Relationships  . Social connections:    Talks on phone: Not on file  Gets together: Not on file    Attends religious service: Not on file    Active member of club or organization: Not on file    Attends meetings of clubs or organizations: Not on file    Relationship status: Not on file  . Intimate partner violence:    Fear of current or ex partner: Not on file    Emotionally abused: Not on file    Physically abused: Not on file    Forced sexual activity: Not on file  Other Topics Concern  . Not on file  Social History Narrative  . Not on file   Family History  Problem Relation Age of Onset  . Cancer Mother   . Cancer Father        Vanessa Kick, MD 05/24/18 1501

## 2018-05-24 NOTE — ED Triage Notes (Signed)
Pt presents to [facility] for assessment of painful urination, lower abdominal pain, and frequency x 2 days.

## 2018-05-26 LAB — URINE CULTURE: Culture: <10000 — AB

## 2018-06-01 ENCOUNTER — Telehealth (HOSPITAL_COMMUNITY): Payer: Self-pay | Admitting: Emergency Medicine

## 2018-06-01 NOTE — Telephone Encounter (Signed)
Pt called stating she still had urinary symptoms. Culture shows insignificant grown. Scheduled patient appointment tomorrow to be reseen for follow up.

## 2018-06-02 ENCOUNTER — Ambulatory Visit (HOSPITAL_COMMUNITY)
Admission: EM | Admit: 2018-06-02 | Discharge: 2018-06-02 | Disposition: A | Discharge status: Home / Self Care | Payer: Self-pay | Attending: Family Medicine | Admitting: Family Medicine

## 2018-06-02 ENCOUNTER — Other Ambulatory Visit: Payer: Self-pay

## 2018-06-02 ENCOUNTER — Encounter (HOSPITAL_COMMUNITY): Payer: Self-pay

## 2018-06-02 DIAGNOSIS — E1165 Type 2 diabetes mellitus with hyperglycemia: Secondary | ICD-10-CM | POA: Insufficient documentation

## 2018-06-02 DIAGNOSIS — R300 Dysuria: Secondary | ICD-10-CM | POA: Insufficient documentation

## 2018-06-02 DIAGNOSIS — R3915 Urgency of urination: Secondary | ICD-10-CM | POA: Insufficient documentation

## 2018-06-02 LAB — POCT URINALYSIS DIP (DEVICE)
GLUCOSE, UA: 100 mg/dL — AB
Ketones, ur: 15 mg/dL — AB
NITRITE: NEGATIVE
Protein, ur: >=300 mg/dL — AB
Urobilinogen, UA: 1 mg/dL (ref 0.0–1.0)
pH: 6 (ref 5.0–8.0)

## 2018-06-02 LAB — GLUCOSE, CAPILLARY: GLUCOSE-CAPILLARY: 220 mg/dL — AB (ref 70–99)

## 2018-06-02 MED ORDER — SULFAMETHOXAZOLE-TRIMETHOPRIM 800-160 MG PO TABS: 1.0000 | ORAL_TABLET | Freq: Two times a day (BID) | ORAL | 0 refills | Status: AC

## 2018-06-02 NOTE — Discharge Instructions (Addendum)
Keep your follow up visit with your PCP on Monday to discuss your current symptoms and to discuss beginning treatment for diabetes.

## 2018-06-02 NOTE — ED Triage Notes (Signed)
Pt presents to [facility] for recurrent UTI symptoms, pt also complains of blood in urine and lower abdominal pain.

## 2018-06-03 LAB — URINE CULTURE: CULTURE: NO GROWTH

## 2018-06-04 NOTE — ED Provider Notes (Signed)
[place]    ASSESSMENT & PLAN:  1. Dysuria   2. Urinary urgency   3. Uncontrolled type 2 diabetes mellitus with hyperglycemia (HCC)   New diagnosis of DM. Stressed importance of prompt follow up. Has appt with new PCP upcoming. Prefers to wait for initiation of treatment. This could very well be contributing to her urinary symptoms. Will give her a short course of Bactrim to assure ourselves this isn't infectious. Should urinary symptoms continue, discussed that other causes be considered.  No signs of pyelonephritis.  Meds ordered this encounter  Medications  . sulfamethoxazole-trimethoprim (BACTRIM DS,SEPTRA DS) 800-160 MG tablet    Sig: Take 1 tablet by mouth 2 (two) times daily for 5 days.    Dispense:  10 tablet    Refill:  0   Urine culture sent. Will notify patient when results available. Will follow up with her PCP or here if not showing improvement over the next 48 hours, sooner if needed.  Outlined signs and symptoms indicating need for more acute intervention. Patient verbalized understanding. After Visit Summary given.  SUBJECTIVE:  Netta Fodge is a 53 y.o. female who complains of urinary frequency, urgency and dysuria for the past few days. I saw her on 05/24/2018 and Rx Keflex. Finished. "Maybe it helped just a little bit. But now the burning and urge are back". No flank pain, fever, chills, abnormal vaginal discharge or bleeding. Hematuria: not present. Normal PO intake and appetitie. No abdominal pain. No self treatment. Ambulatory without difficulty.  LMP: No LMP recorded. Patient is postmenopausal.  ROS: As in HPI.  OBJECTIVE:  Vitals:   06/02/18 1620 06/02/18 1622  BP: 138/83   Pulse: (!) 101   Resp: 18   Temp: 98.4 F (36.9 C)   TempSrc: Oral   SpO2: 98% 98%   Appears well, in no apparent distress. Oropharynx moist. Abdomen is soft without tenderness, guarding, mass, rebound or organomegaly. No CVA tenderness or inguinal adenopathy  noted.  Labs Reviewed  GLUCOSE, CAPILLARY - Abnormal; Notable for the following components:      Result Value   Glucose-Capillary 220 (*)    All other components within normal limits  POCT URINALYSIS DIP (DEVICE) - Abnormal; Notable for the following components:   Glucose, UA 100 (*)    Bilirubin Urine SMALL (*)    Ketones, ur 15 (*)    Hgb urine dipstick MODERATE (*)    Protein, ur >=300 (*)    Leukocytes, UA TRACE (*)    All other components within normal limits  URINE CULTURE  CBG MONITORING, ED    No Known Allergies  Past Medical History:  Diagnosis Date  . Ankle fracture 2016   Right  . Aortic atherosclerosis (HCC)    trace calcific atherosclerosis aortic arch per ct neck done 12-22-17  . Bronchitis   . OA (osteoarthritis) of shoulder    left shoulder, both knees arthritis   Social History   Socioeconomic History  . Marital status: Single    Spouse name: Not on file  . Number of children: Not on file  . Years of education: Not on file  . Highest education level: Not on file  Occupational History  . Not on file  Social Needs  . Financial resource strain: Not on file  . Food insecurity:    Worry: Not on file    Inability: Not on file  . Transportation needs:    Medical: Not on file    Non-medical: Not on file  Tobacco Use  . Smoking status: Current Some Day Smoker    Packs/day: 0.30    Types: Cigarettes  . Smokeless tobacco: Never Used  . Tobacco comment: stopped 2 months ago, smokes 1-2 cigarettes day now  Substance and Sexual Activity  . Alcohol use: Yes    Comment: Occasional  . Drug use: No  . Sexual activity: Not on file  Lifestyle  . Physical activity:    Days per week: Not on file    Minutes per session: Not on file  . Stress: Not on file  Relationships  . Social connections:    Talks on phone: Not on file    Gets together: Not on file    Attends religious service: Not on file    Active member of club or organization: Not on file     Attends meetings of clubs or organizations: Not on file    Relationship status: Not on file  . Intimate partner violence:    Fear of current or ex partner: Not on file    Emotionally abused: Not on file    Physically abused: Not on file    Forced sexual activity: Not on file  Other Topics Concern  . Not on file  Social History Narrative  . Not on file   Family History  Problem Relation Age of Onset  . Cancer Mother   . Cancer Father        Vanessa Kick, MD 06/04/18 [phone]

## 2018-06-25 ENCOUNTER — Encounter (HOSPITAL_COMMUNITY): Payer: Self-pay

## 2018-06-25 ENCOUNTER — Emergency Department (HOSPITAL_COMMUNITY): Payer: Self-pay

## 2018-06-25 ENCOUNTER — Emergency Department (HOSPITAL_COMMUNITY)
Admission: EM | Admit: 2018-06-25 | Discharge: 2018-06-25 | Disposition: A | Discharge status: Home / Self Care | Payer: Self-pay | Attending: Emergency Medicine | Admitting: Emergency Medicine

## 2018-06-25 DIAGNOSIS — J101 Influenza due to other identified influenza virus with other respiratory manifestations: Secondary | ICD-10-CM | POA: Insufficient documentation

## 2018-06-25 DIAGNOSIS — Z79899 Other long term (current) drug therapy: Secondary | ICD-10-CM | POA: Insufficient documentation

## 2018-06-25 DIAGNOSIS — R69 Illness, unspecified: Secondary | ICD-10-CM

## 2018-06-25 DIAGNOSIS — J111 Influenza due to unidentified influenza virus with other respiratory manifestations: Secondary | ICD-10-CM

## 2018-06-25 DIAGNOSIS — F17210 Nicotine dependence, cigarettes, uncomplicated: Secondary | ICD-10-CM | POA: Insufficient documentation

## 2018-06-25 LAB — CBC WITH DIFFERENTIAL/PLATELET
Abs Immature Granulocytes: 0.03 10*3/uL (ref 0.00–0.07)
Basophils Absolute: 0 10*3/uL (ref 0.0–0.1)
Basophils Relative: 0 %
Eosinophils Absolute: 0.1 10*3/uL (ref 0.0–0.5)
Eosinophils Relative: 1 %
HCT: 38.5 % (ref 36.0–46.0)
Hemoglobin: 12.2 g/dL (ref 12.0–15.0)
Immature Granulocytes: 0 %
Lymphocytes Relative: 14 %
Lymphs Abs: 1.6 10*3/uL (ref 0.7–4.0)
MCH: 30.3 pg (ref 26.0–34.0)
MCHC: 31.7 g/dL (ref 30.0–36.0)
MCV: 95.8 fL (ref 80.0–100.0)
Monocytes Absolute: 0.9 10*3/uL (ref 0.1–1.0)
Monocytes Relative: 8 %
Neutro Abs: 8.6 10*3/uL — ABNORMAL HIGH (ref 1.7–7.7)
Neutrophils Relative %: 77 %
Platelets: 278 10*3/uL (ref 150–400)
RBC: 4.02 MIL/uL (ref 3.87–5.11)
RDW: 12.6 % (ref 11.5–15.5)
WBC: 11.3 10*3/uL — ABNORMAL HIGH (ref 4.0–10.5)
nRBC: 0 % (ref 0.0–0.2)

## 2018-06-25 LAB — COMPREHENSIVE METABOLIC PANEL
ALBUMIN: 3.7 g/dL (ref 3.5–5.0)
ALT: 17 U/L (ref 0–44)
ANION GAP: 9 (ref 5–15)
AST: 22 U/L (ref 15–41)
Alkaline Phosphatase: 62 U/L (ref 38–126)
BUN: 5 mg/dL — ABNORMAL LOW (ref 6–20)
CHLORIDE: 102 mmol/L (ref 98–111)
CO2: 25 mmol/L (ref 22–32)
Calcium: 9.3 mg/dL (ref 8.9–10.3)
Creatinine, Ser: 0.63 mg/dL (ref 0.44–1.00)
GFR calc non Af Amer: >60 mL/min (ref >60)
GLUCOSE: 149 mg/dL — AB (ref 70–99)
Potassium: 3.7 mmol/L (ref 3.5–5.1)
SODIUM: 136 mmol/L (ref 135–145)
Total Bilirubin: 1.2 mg/dL (ref 0.3–1.2)
Total Protein: 7.5 g/dL (ref 6.5–8.1)

## 2018-06-25 LAB — I-STAT BETA HCG BLOOD, ED (MC, WL, AP ONLY): I-stat hCG, quantitative: <5 m[IU]/mL

## 2018-06-25 MED ORDER — BENZONATATE 100 MG PO CAPS: 100.0000 mg | ORAL_CAPSULE | Freq: Three times a day (TID) | ORAL | 0 refills | Status: DC

## 2018-06-25 MED ORDER — HYDROCOD POLST-CPM POLST ER 10-8 MG/5ML PO SUER
5.0000 mL | Freq: Once | ORAL | Status: AC
  Administered 2018-06-25: 5 mL via ORAL
  Filled 2018-06-25: qty 5

## 2018-06-25 MED ORDER — KETOROLAC TROMETHAMINE 60 MG/2ML IM SOLN
15.0000 mg | Freq: Once | INTRAMUSCULAR | Status: AC
  Administered 2018-06-25: 15 mg via INTRAMUSCULAR
  Filled 2018-06-25: qty 2

## 2018-06-25 MED ORDER — ACETAMINOPHEN 500 MG PO TABS
1000.0000 mg | ORAL_TABLET | Freq: Once | ORAL | Status: AC
  Administered 2018-06-25: 1000 mg via ORAL
  Filled 2018-06-25: qty 2

## 2018-06-25 MED ORDER — ONDANSETRON 4 MG PO TBDP: 4.0000 mg | ORAL_TABLET | Freq: Three times a day (TID) | ORAL | 0 refills | Status: DC | PRN

## 2018-06-25 MED ORDER — ONDANSETRON 4 MG PO TBDP
4.0000 mg | ORAL_TABLET | Freq: Once | ORAL | Status: AC
  Administered 2018-06-25: 4 mg via ORAL
  Filled 2018-06-25: qty 1

## 2018-06-25 NOTE — ED Triage Notes (Signed)
Pt endorses cough, n/v/d, bodyaches, chills since last night. 102.6 oral temp in triage.

## 2018-06-25 NOTE — Discharge Instructions (Signed)
Take tylenol 2 pills 4 times a day and motrin 4 pills 3 times a day.  Drink plenty of fluids.  Return for worsening shortness of breath, headache, confusion. Follow up with your family doctor.   You can try Sudafed for this.  It can make her heart race or your blood pressure go up.

## 2018-06-25 NOTE — ED Provider Notes (Signed)
[facility] EMERGENCY DEPARTMENT Provider Note   CSN: [id number] Arrival date & time: 06/25/18  1221     History   Chief Complaint Chief Complaint  Patient presents with  . Influenza    HPI Ann Foster is a 53 y.o. female.  53 yo F with a chief complaints of cough congestion fevers chills myalgias nausea vomiting diarrhea.  This been going on since yesterday.  She has a grandchild that has a very similar illness.  Having fevers at home.  Denies abdominal pain.  Mild right sided headache no noted neck pain.   The history is provided by the patient.  Influenza  Presenting symptoms: cough, fever and shortness of breath   Presenting symptoms: no headaches, no myalgias, no nausea, no rhinorrhea and no vomiting   Severity:  Moderate Onset quality:  Sudden Duration:  1 hour Progression:  Worsening Chronicity:  New Relieved by:  Nothing Worsened by:  Nothing Ineffective treatments:  None tried Associated symptoms: chills and nasal congestion     Past Medical History:  Diagnosis Date  . Ankle fracture 2016   Right  . Aortic atherosclerosis (HCC)    trace calcific atherosclerosis aortic arch per ct neck done 12-22-17  . Bronchitis   . OA (osteoarthritis) of shoulder    left shoulder, both knees arthritis    Patient Active Problem List   Diagnosis Date Noted  . Impingement syndrome of left shoulder 01/23/2018    Past Surgical History:  Procedure Laterality Date  . NO PAST SURGERIES       OB History   No obstetric history on file.      Home Medications    Prior to Admission medications   Medication Sig Start Date End Date Taking? Authorizing Provider  benzonatate (TESSALON) 100 MG capsule Take 1 capsule (100 mg total) by mouth every 8 (eight) hours. 06/25/18   Deno Etienne, DO  ibuprofen (ADVIL,MOTRIN) 200 MG tablet Take 400-600 mg by mouth every 6 (six) hours as needed for headache (pain).    [provider]  ondansetron (ZOFRAN ODT) 4  MG disintegrating tablet Take 1 tablet (4 mg total) by mouth every 8 (eight) hours as needed for nausea or vomiting. 06/25/18   Deno Etienne, DO    Family History Family History  Problem Relation Age of Onset  . Cancer Mother   . Cancer Father     Social History Social History   Tobacco Use  . Smoking status: Current Some Day Smoker    Packs/day: 0.30    Types: Cigarettes  . Smokeless tobacco: Never Used  . Tobacco comment: stopped 2 months ago, smokes 1-2 cigarettes day now  Substance Use Topics  . Alcohol use: Yes    Comment: Occasional  . Drug use: No     Allergies   Patient has no known allergies.   Review of Systems Review of Systems  Constitutional: Positive for chills and fever.  HENT: Positive for congestion. Negative for rhinorrhea.   Eyes: Negative for redness and visual disturbance.  Respiratory: Positive for cough and shortness of breath. Negative for wheezing.   Cardiovascular: Negative for chest pain and palpitations.  Gastrointestinal: Negative for nausea and vomiting.  Genitourinary: Negative for dysuria and urgency.  Musculoskeletal: Negative for arthralgias and myalgias.  Skin: Negative for pallor and wound.  Neurological: Negative for dizziness and headaches.     Physical Exam Updated Vital Signs BP 121/73 (BP Location: Right Arm)   Pulse 98   Temp 99.9 F (  37.7 C) (Oral)   Resp 18   SpO2 97%   Physical Exam Vitals signs and nursing note reviewed.  Constitutional:      General: She is not in acute distress.    Appearance: She is well-developed. She is not diaphoretic.  HENT:     Head: Normocephalic and atraumatic.     Comments: Swollen turbinates, posterior nasal drip, no noted sinus ttp, tm normal bilaterally.   Eyes:     Pupils: Pupils are equal, round, and reactive to light.  Neck:     Musculoskeletal: Normal range of motion and neck supple.  Cardiovascular:     Rate and Rhythm: Normal rate and regular rhythm.     Heart sounds:  No murmur. No friction rub. No gallop.   Pulmonary:     Effort: Pulmonary effort is normal.     Breath sounds: No wheezing or rales.  Abdominal:     General: There is no distension.     Palpations: Abdomen is soft.     Tenderness: There is no abdominal tenderness.  Musculoskeletal:        General: No tenderness.  Skin:    General: Skin is warm and dry.  Neurological:     Mental Status: She is alert and oriented to person, place, and time.  Psychiatric:        Behavior: Behavior normal.      ED Treatments / Results  Labs (all labs ordered are listed, but only abnormal results are displayed) Labs Reviewed  COMPREHENSIVE METABOLIC PANEL - Abnormal; Notable for the following components:      Result Value   Glucose, Bld 149 (*)    BUN 5 (*)    All other components within normal limits  CBC WITH DIFFERENTIAL/PLATELET - Abnormal; Notable for the following components:   WBC 11.3 (*)    Neutro Abs 8.6 (*)    All other components within normal limits  I-STAT BETA HCG BLOOD, ED ([facility], [facility], [facility] ONLY)    EKG None  Radiology Dg Chest 2 View  Result Date: 06/25/2018 CLINICAL DATA:  Cough. Body aches. Chills. Symptoms began last night. EXAM: CHEST - 2 VIEW COMPARISON:  02/08/2017 FINDINGS: Heart and mediastinal shadows are normal. There is central bronchial thickening. No infiltrate, collapse or effusion. No significant bone finding. IMPRESSION: Bronchitis pattern. No consolidation or collapse. Electronically Signed   By: Nelson Chimes M.D.   On: 06/25/2018 13:23    Procedures Procedures (including critical care time)  Medications Ordered in ED Medications  ketorolac (TORADOL) injection 15 mg (has no administration in time range)  chlorpheniramine-HYDROcodone (TUSSIONEX) 10-8 MG/5ML suspension 5 mL (has no administration in time range)  acetaminophen (TYLENOL) tablet 1,000 mg (1,000 mg Oral Given 06/25/18 1242)  ondansetron (ZOFRAN-ODT) disintegrating tablet 4 mg (4 mg Oral Given  06/25/18 1242)     Initial Impression / Assessment and Plan / ED Course  I have reviewed the triage vital signs and the nursing notes.  Pertinent labs & imaging results that were available during my care of the patient were reviewed by me and considered in my medical decision making (see chart for details).     53 yo F with a chief complaint of cough congestion fever chills myalgias nausea vomiting diarrhea.  Sounds like the patient clinically has influenza.  She appears to not feel well but is well-hydrated with no bacterial source found on exam.  Will discharge with nausea and cough medicine.  Tylenol Profen.  I discussed risks and benefits  of Tamiflu and she is declining at this time.  4:21 PM:  I have discussed the diagnosis/risks/treatment options with the patient and family and believe the pt to be eligible for discharge home to follow-up with PCP. We also discussed returning to the ED immediately if new or worsening sx occur. We discussed the sx which are most concerning (e.g., sudden worsening pain, fever, inability to tolerate by mouth) that necessitate immediate return. Medications administered to the patient during their visit and any new prescriptions provided to the patient are listed below.  Medications given during this visit Medications  ketorolac (TORADOL) injection 15 mg (has no administration in time range)  chlorpheniramine-HYDROcodone (TUSSIONEX) 10-8 MG/5ML suspension 5 mL (has no administration in time range)  acetaminophen (TYLENOL) tablet 1,000 mg (1,000 mg Oral Given 06/25/18 1242)  ondansetron (ZOFRAN-ODT) disintegrating tablet 4 mg (4 mg Oral Given 06/25/18 1242)     The patient appears reasonably screen and/or stabilized for discharge and I doubt any other medical condition or other [facility] requiring further screening, evaluation, or treatment in the ED at this time prior to discharge.    Final Clinical Impressions(s) / ED Diagnoses   Final diagnoses:    Influenza-like illness    ED Discharge Orders         Ordered    benzonatate (TESSALON) 100 MG capsule  Every 8 hours     06/25/18 1617    ondansetron (ZOFRAN ODT) 4 MG disintegrating tablet  Every 8 hours PRN     06/25/18 [place], [place], DO 06/25/18 1621

## 2019-04-20 ENCOUNTER — Other Ambulatory Visit: Payer: Self-pay

## 2019-04-20 ENCOUNTER — Encounter (HOSPITAL_COMMUNITY): Payer: Self-pay | Admitting: Emergency Medicine

## 2019-04-20 ENCOUNTER — Emergency Department (HOSPITAL_COMMUNITY)
Admission: EM | Admit: 2019-04-20 | Discharge: 2019-04-20 | Disposition: A | Discharge status: Home / Self Care | Payer: Self-pay | Attending: Emergency Medicine | Admitting: Emergency Medicine

## 2019-04-20 DIAGNOSIS — Z79899 Other long term (current) drug therapy: Secondary | ICD-10-CM | POA: Insufficient documentation

## 2019-04-20 DIAGNOSIS — E1165 Type 2 diabetes mellitus with hyperglycemia: Secondary | ICD-10-CM | POA: Insufficient documentation

## 2019-04-20 DIAGNOSIS — E119 Type 2 diabetes mellitus without complications: Secondary | ICD-10-CM

## 2019-04-20 DIAGNOSIS — R739 Hyperglycemia, unspecified: Secondary | ICD-10-CM

## 2019-04-20 DIAGNOSIS — F17210 Nicotine dependence, cigarettes, uncomplicated: Secondary | ICD-10-CM | POA: Insufficient documentation

## 2019-04-20 DIAGNOSIS — Z7984 Long term (current) use of oral hypoglycemic drugs: Secondary | ICD-10-CM | POA: Insufficient documentation

## 2019-04-20 HISTORY — DX: Type 2 diabetes mellitus without complications: E11.9

## 2019-04-20 LAB — URINALYSIS, ROUTINE W REFLEX MICROSCOPIC
Bilirubin Urine: NEGATIVE
Glucose, UA: >=500 mg/dL — AB
Hgb urine dipstick: NEGATIVE
Ketones, ur: 5 mg/dL — AB
Leukocytes,Ua: NEGATIVE
Nitrite: NEGATIVE
Protein, ur: 100 mg/dL — AB
Specific Gravity, Urine: 1.035 — ABNORMAL HIGH (ref 1.005–1.030)
pH: 5 (ref 5.0–8.0)

## 2019-04-20 LAB — CBG MONITORING, ED
Glucose-Capillary: 325 mg/dL — ABNORMAL HIGH (ref 70–99)
Glucose-Capillary: 170 mg/dL — ABNORMAL HIGH (ref 70–99)

## 2019-04-20 LAB — CBC
HCT: 39.2 % (ref 36.0–46.0)
Hemoglobin: 13.2 g/dL (ref 12.0–15.0)
MCH: 31.8 pg (ref 26.0–34.0)
MCHC: 33.7 g/dL (ref 30.0–36.0)
MCV: 94.5 fL (ref 80.0–100.0)
Platelets: 343 10*3/uL (ref 150–400)
RBC: 4.15 MIL/uL (ref 3.87–5.11)
RDW: 12.3 % (ref 11.5–15.5)
WBC: 8.3 10*3/uL (ref 4.0–10.5)
nRBC: 0 % (ref 0.0–0.2)

## 2019-04-20 LAB — HEMOGLOBIN A1C
Hgb A1c MFr Bld: 12 % — ABNORMAL HIGH (ref 4.8–5.6)
Mean Plasma Glucose: 297.7 mg/dL

## 2019-04-20 LAB — BASIC METABOLIC PANEL
Anion gap: 11 (ref 5–15)
BUN: 10 mg/dL (ref 6–20)
CO2: 23 mmol/L (ref 22–32)
Calcium: 9.8 mg/dL (ref 8.9–10.3)
Chloride: 101 mmol/L (ref 98–111)
Creatinine, Ser: 0.6 mg/dL (ref 0.44–1.00)
GFR calc Af Amer: >60 mL/min (ref >60)
GFR calc non Af Amer: >60 mL/min (ref >60)
Glucose, Bld: 302 mg/dL — ABNORMAL HIGH (ref 70–99)
Potassium: 3.6 mmol/L (ref 3.5–5.1)
Sodium: 135 mmol/L (ref 135–145)

## 2019-04-20 MED ORDER — METFORMIN HCL 500 MG PO TABS: 500.0000 mg | ORAL_TABLET | Freq: Two times a day (BID) | ORAL | 0 refills | Status: DC

## 2019-04-20 MED ORDER — SODIUM CHLORIDE 0.9 % IV BOLUS
1000.0000 mL | Freq: Once | INTRAVENOUS | Status: AC
  Administered 2019-04-20: 1000 mL via INTRAVENOUS

## 2019-04-20 MED ORDER — METFORMIN HCL 500 MG PO TABS
500.0000 mg | ORAL_TABLET | Freq: Once | ORAL | Status: AC
  Administered 2019-04-20: 500 mg via ORAL
  Filled 2019-04-20: qty 1

## 2019-04-20 NOTE — ED Notes (Signed)
Pt was concerned about her blood pressure of 144/95 and asked if she needed some home medication for blood pressure. PA was called and talked to pt.

## 2019-04-20 NOTE — ED Provider Notes (Signed)
[facility] EMERGENCY DEPARTMENT Provider Note   CSN: [id number] Arrival date & time: 04/20/19  1451     History   Chief Complaint Chief Complaint  Patient presents with   Hyperglycemia    HPI Ann Foster is a 53 y.o. female.     Ann Foster is a 53 y.o. female with a history of borderline diabetes, osteoarthritis, bronchitis, atherosclerosis, and obesity, who presents to the emergency department for evaluation of elevated blood sugars.  Patient reports that over the past few days she has been feeling fatigued and reports that her head feels foggy, and she has had polyuria and polydipsia.  Her niece checked her blood sugar yesterday and reported that it was 393.  Patient reports that she has been told that she has borderline diabetic, but has not been placed on any medications to manage diabetes.  She also reports that last night she had a tingling burning feeling in her feet.  Denies severe headaches, vision changes, she was a bit nauseated last night but denies vomiting.  No abdominal pain.  Denies any chest pain, shortness of breath, cough or fever.  Reports that she is noted that her blood pressure has been a bit elevated as well, she has not previously been on blood pressure medications, systolic blood pressure in the 140s on arrival.  Patient reports that she usually follows up with [facility] but has not been seen recently.  No other aggravating or alleviating factors.     Past Medical History:  Diagnosis Date   Ankle fracture 2016   Right   Aortic atherosclerosis (HCC)    trace calcific atherosclerosis aortic arch per ct neck done 12-22-17   Bronchitis    Diabetes mellitus without complication (HCC)    OA (osteoarthritis) of shoulder    left shoulder, both knees arthritis    Patient Active Problem List   Diagnosis Date Noted   Impingement syndrome of left shoulder 01/23/2018    Past Surgical History:  Procedure  Laterality Date   NO PAST SURGERIES       OB History   No obstetric history on file.      Home Medications    Prior to Admission medications   Medication Sig Start Date End Date Taking? Authorizing Provider  benzonatate (TESSALON) 100 MG capsule Take 1 capsule (100 mg total) by mouth every 8 (eight) hours. 06/25/18   Deno Etienne, DO  ibuprofen (ADVIL,MOTRIN) 200 MG tablet Take 400-600 mg by mouth every 6 (six) hours as needed for headache (pain).    [provider]  metFORMIN (GLUCOPHAGE) 500 MG tablet Take 1 tablet (500 mg total) by mouth 2 (two) times daily with a meal. 04/20/19   Jacqlyn Larsen, PA-C  ondansetron (ZOFRAN ODT) 4 MG disintegrating tablet Take 1 tablet (4 mg total) by mouth every 8 (eight) hours as needed for nausea or vomiting. 06/25/18   Deno Etienne, DO    Family History Family History  Problem Relation Age of Onset   Cancer Mother    Cancer Father     Social History Social History   Tobacco Use   Smoking status: Current Some Day Smoker    Packs/day: 0.30    Types: Cigarettes   Smokeless tobacco: Never Used   Tobacco comment: stopped 2 months ago, smokes 1-2 cigarettes day now  Substance Use Topics   Alcohol use: Yes    Comment: Occasional   Drug use: No     Allergies  Patient has no known allergies.   Review of Systems Review of Systems  Constitutional: Positive for fatigue. Negative for chills and fever.  Respiratory: Negative for cough and shortness of breath.   Cardiovascular: Negative for chest pain.  Gastrointestinal: Positive for nausea. Negative for abdominal pain, diarrhea and vomiting.  Endocrine: Positive for polydipsia and polyuria.  Musculoskeletal: Negative for arthralgias and myalgias.  Skin: Negative for color change and rash.  Neurological: Negative for dizziness, syncope, weakness, light-headedness, numbness and headaches.     Physical Exam Updated Vital Signs BP (!) 144/95 (BP Location: Right Arm)     Pulse 74    Temp 98.3 F (36.8 C) (Oral)    Resp 18    SpO2 98%   Physical Exam Vitals signs and nursing note reviewed.  Constitutional:      General: She is not in acute distress.    Appearance: Normal appearance. She is well-developed. She is obese. She is not ill-appearing or diaphoretic.  HENT:     Head: Normocephalic and atraumatic.     Mouth/Throat:     Mouth: Mucous membranes are moist.     Pharynx: Oropharynx is clear.  Eyes:     General:        Right eye: No discharge.        Left eye: No discharge.     Pupils: Pupils are equal, round, and reactive to light.  Neck:     Musculoskeletal: Neck supple.  Cardiovascular:     Rate and Rhythm: Normal rate and regular rhythm.     Heart sounds: Normal heart sounds.  Pulmonary:     Effort: Pulmonary effort is normal. No respiratory distress.     Breath sounds: Normal breath sounds. No wheezing or rales.     Comments: Respirations equal and unlabored, patient able to speak in full sentences, lungs clear to auscultation bilaterally Abdominal:     General: Bowel sounds are normal. There is no distension.     Palpations: Abdomen is soft. There is no mass.     Tenderness: There is no abdominal tenderness. There is no guarding.     Comments: Abdomen soft, nondistended, nontender to palpation in all quadrants without guarding or peritoneal signs  Musculoskeletal:        General: No deformity.  Skin:    General: Skin is warm and dry.     Capillary Refill: Capillary refill takes less than 2 seconds.  Neurological:     Mental Status: She is alert.     Coordination: Coordination normal.     Comments: Speech is clear, able to follow commands Moves extremities without ataxia, coordination intact  Psychiatric:        Mood and Affect: Mood normal.        Behavior: Behavior normal.      ED Treatments / Results  Labs (all labs ordered are listed, but only abnormal results are displayed) Labs Reviewed  BASIC METABOLIC PANEL -  Abnormal; Notable for the following components:      Result Value   Glucose, Bld 302 (*)    All other components within normal limits  URINALYSIS, ROUTINE W REFLEX MICROSCOPIC - Abnormal; Notable for the following components:   APPearance CLOUDY (*)    Specific Gravity, Urine 1.035 (*)    Glucose, UA >=500 (*)    Ketones, ur 5 (*)    Protein, ur 100 (*)    Bacteria, UA RARE (*)    All other components within normal limits  HEMOGLOBIN A1C -  Abnormal; Notable for the following components:   Hgb A1c MFr Bld 12.0 (*)    All other components within normal limits  CBG MONITORING, ED - Abnormal; Notable for the following components:   Glucose-Capillary 325 (*)    All other components within normal limits  CBG MONITORING, ED - Abnormal; Notable for the following components:   Glucose-Capillary 170 (*)    All other components within normal limits  CBC  CBG MONITORING, ED  CBG MONITORING, ED    EKG None  Radiology No results found.  Procedures Procedures (including critical care time)  Medications Ordered in ED Medications  sodium chloride 0.9 % bolus 1,000 mL (0 mLs Intravenous Stopped 04/20/19 1820)  metFORMIN (GLUCOPHAGE) tablet 500 mg (500 mg Oral Given 04/20/19 1710)     Initial Impression / Assessment and Plan / ED Course  I have reviewed the triage vital signs and the nursing notes.  Pertinent labs & imaging results that were available during my care of the patient were reviewed by me and considered in my medical decision making (see chart for details).  53 year old female presents with elevated blood sugars.  Reports in the past she has been told she is borderline diabetic and has never been started on medications before.  Has been feeling fatigued and unwell for the past week and has been having polyuria and polydipsia, had her niece check her blood sugar yesterday and it was 393.  She has had some nausea, and has felt foggy headed but denies headache, visual changes,  chest pain, shortness of breath or abdominal pain.  Blood sugar of 325 on arrival but patient with no other significant electrolyte derangements, and normal anion gap, no leukocytosis, urinalysis with glucose but only 5 of ketones.  No evidence of DKA, patient does not appear significantly dehydrated, will give 1 L fluid bolus and also start patient on 500 mg Metformin, discussed with her that at this point she will need medication to manage her diabetes.  I have stressed the importance of close outpatient follow-up and I placed a case management consult to help aid in follow-up at the [facility].  I went ahead and sent hemoglobin A1c to help with medication management at her PCPs office.  Will have patient start Metformin 500 twice daily.  Counseled patient on use and side effects of this medication.  Return precautions discussed.  Blood sugar has improved to 170 here in the ED and feel patient is stable for discharge home.  Patient reports her blood pressures have been mildly elevated I have asked her to speak with her PCP about this she is not having any concerning symptoms to suggest hypertensive urgency or emergency and blood pressures are only mildly elevated here.  Patient expresses understanding and agreement.  Final Clinical Impressions(s) / ED Diagnoses   Final diagnoses:  Hyperglycemia  Type 2 diabetes mellitus without complication, without long-term current use of insulin [facility])    ED Discharge Orders         Ordered    metFORMIN (GLUCOPHAGE) 500 MG tablet  2 times daily with meals     04/20/19 1929           Janet Berlin 04/20/19 2008    Milton Ferguson, MD 04/22/19 [id number]

## 2019-04-20 NOTE — ED Notes (Signed)
Pt CBG was 170 notified Lattie Haw, Therapist, sports

## 2019-04-20 NOTE — ED Triage Notes (Signed)
Pt reports blood sugar over 300 since yesterday.  States she was told she was a borderline diabetic.  Not currently taking diabetic medications.  C/o bilateral feet itching since last night.  BP elevated.  C/o headache since yesterday.  Feels lightheaded today.  Increased urination and dry mouth.

## 2019-04-20 NOTE — ED Notes (Signed)
Patient Alert and oriented to baseline. Stable and ambulatory to baseline. Patient verbalized understanding of the discharge instructions.  Patient belongings were taken by the patient.   

## 2019-04-20 NOTE — Discharge Instructions (Addendum)
Your blood sugars are elevated with the last of your lab work looks good.  Please begin taking Metformin 500 mg twice daily.  It is very important that you follow-up with your primary care doctor within the next week for further management for diabetes.  Call first thing Monday morning to schedule an appointment.  This new medication can cause some upset stomach, please make sure you take this with food.  Return to the ED for any new or worsening symptoms.

## 2019-04-22 ENCOUNTER — Telehealth: Payer: Self-pay

## 2019-04-22 NOTE — Telephone Encounter (Signed)
Message received from Laurena Slimmer, RN CM requesting a hospital follow up appt for the patient at [facility].  Informed her that an appt has been scheduled for 05/01/2019.

## 2019-04-24 IMAGING — CT CT NECK W/ CM
4 of 6 series · 12 of 33 positions shown, 14 images · IV contrast (omnipaque)
Comparison: None.

CLINICAL DATA: Dysphagia, LEFT peritonsillar abscess.

EXAM:
CT NECK WITH CONTRAST
TECHNIQUE: Multidetector CT imaging of the neck was performed using the
standard protocol following the bolus administration of intravenous
contrast.
CONTRAST:  75 mL OMNIPAQUE IOHEXOL 300 MG/ML  SOLN

[Series 5: neck lungs · axial · 0.46mm/px · z∈[-210,-122]mm · 2 of 133 slices shown]
[im 45/133  bone]
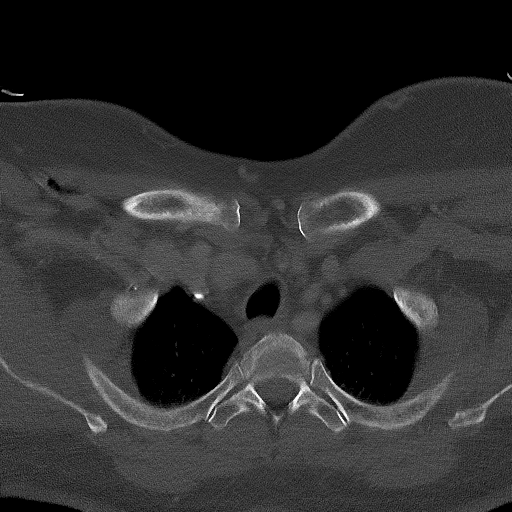
[im 89/133  bone]
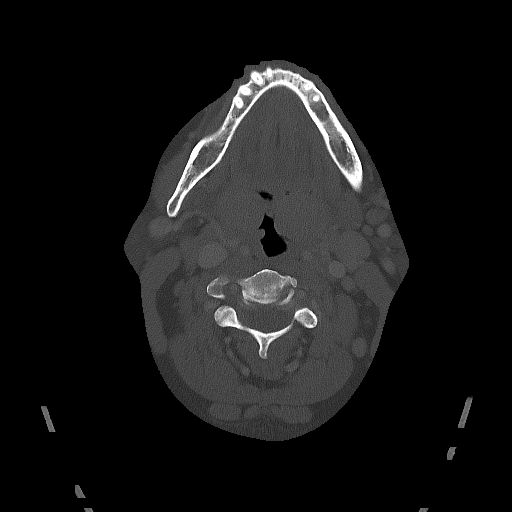

[Series 6: axial bone · axial · 0.46mm/px · z∈[-210,-122]mm · 2 of 133 slices shown, 3 images]
[im 45/133  soft-tissue]
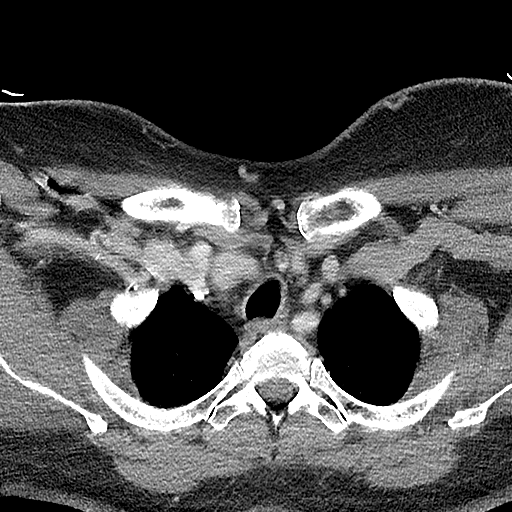
[im 45/133  bone]
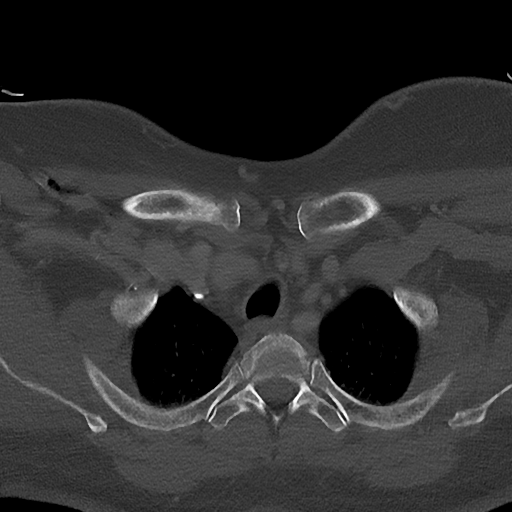
[im 89/133  bone]
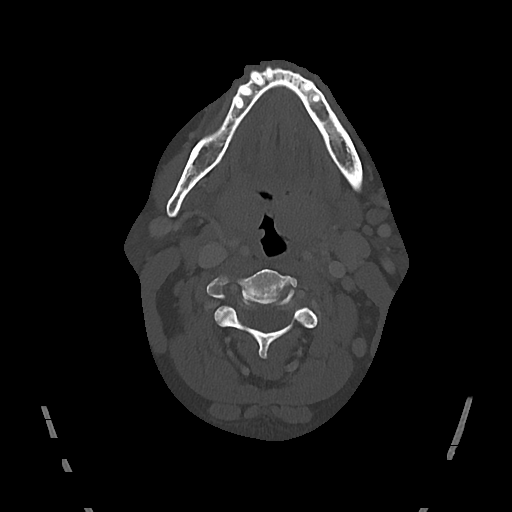

[Series 7: sag neck · sagittal · 0.45mm/px · 5 of 101 slices shown, 6 images]
[im 34/101  bone]
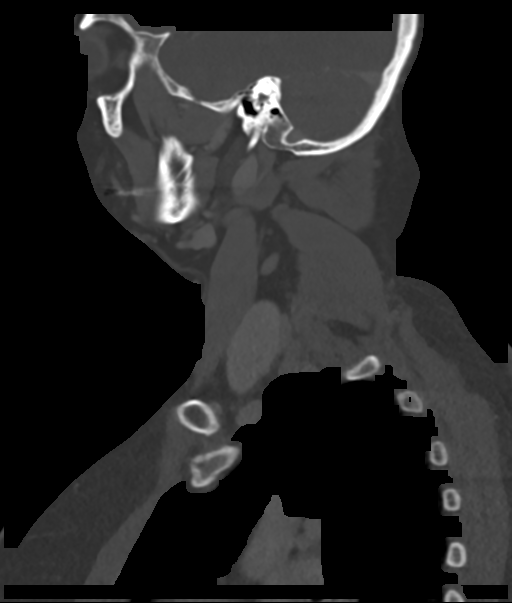
[im 42/101  bone]
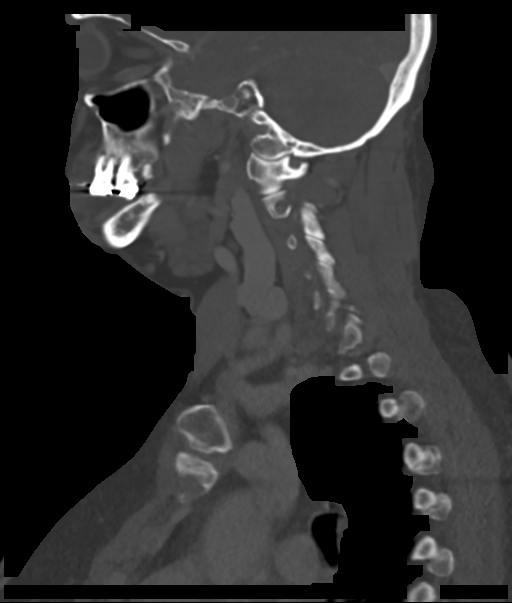
[im 51/101  soft-tissue]
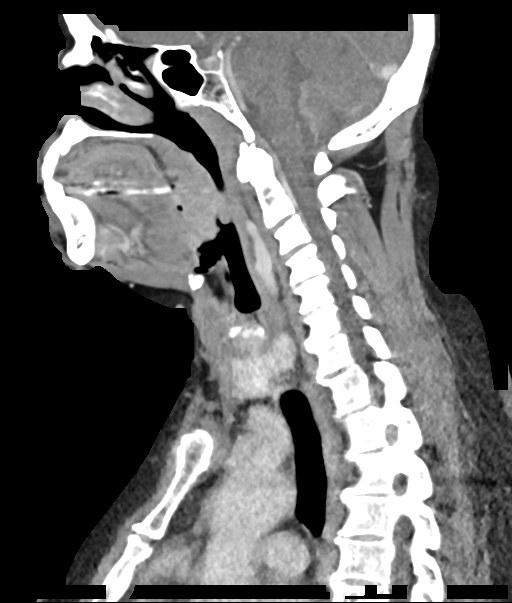
[im 51/101  bone]
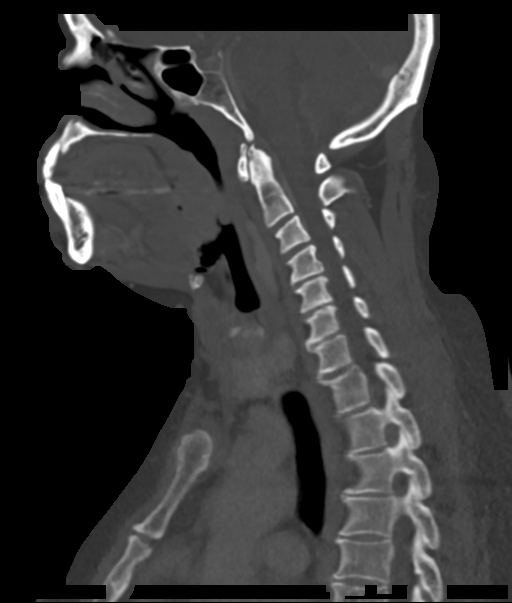
[im 59/101  bone]
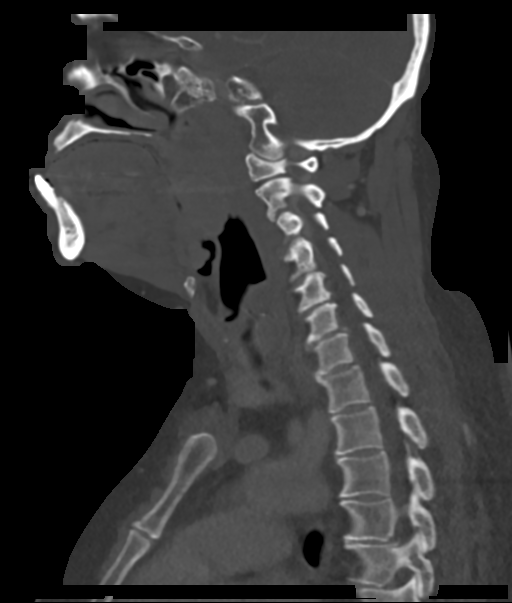
[im 67/101  bone]
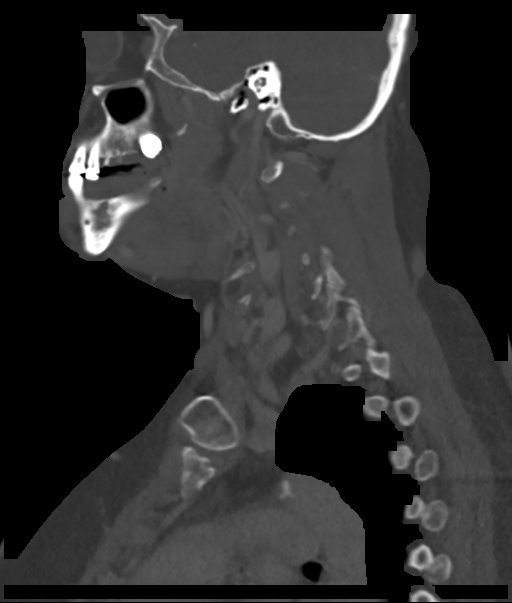

[Series 8: cor neck · coronal · 0.46mm/px · 3 of 100 slices shown]
[im 20/100  bone]
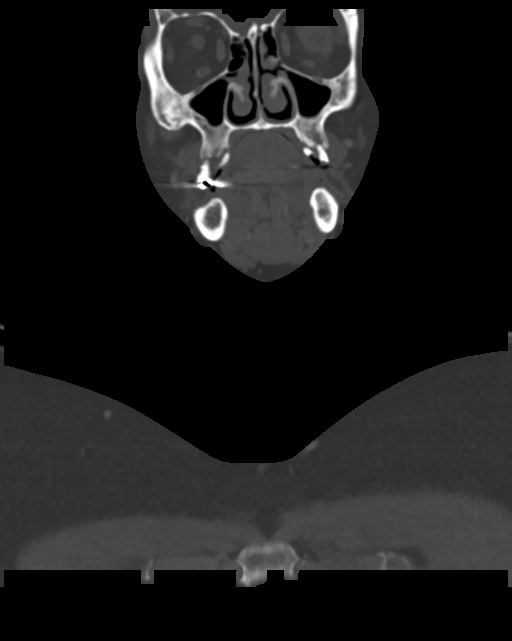
[im 40/100  bone]
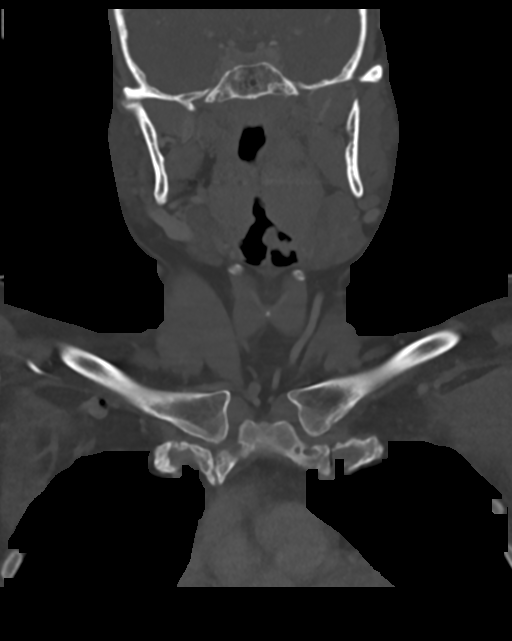
[im 60/100  bone]
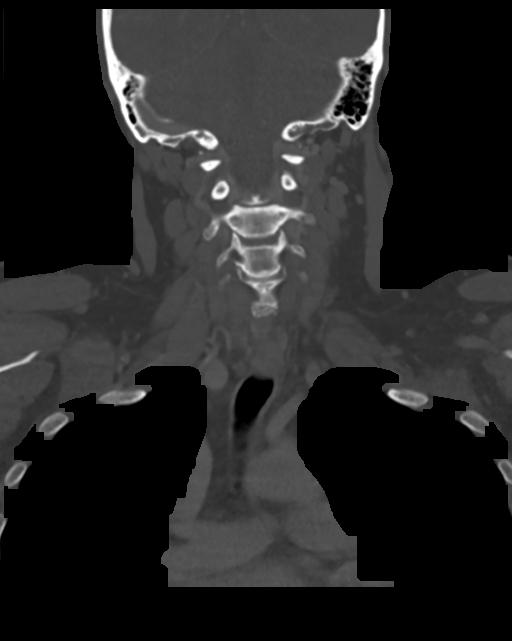

[12 of 33 positions shown; findings below may reference images not displayed]

FINDINGS: PHARYNX AND LARYNX: LEFT greater than RIGHT palatine tonsillar
enlargement with enhancement, 6 x 7 x 13 mm LEFT tonsillar fluid
collection. Mild LEFT peritonsillar/parapharyngeal fat stranding.
Narrowed oropharynx due to tonsillar hypertrophy. Patent airway.
Normal epiglottis and larynx.

SALIVARY GLANDS: Normal.

THYROID: 10 mm RIGHT thyroid nodule. No routine indicated follow-up.
This follows ACR consensus guidelines: Managing Incidental Thyroid
Nodules Detected on Imaging: White Paper of [REDACTED]. [HOSPITAL] 3351; [DATE].

LYMPH NODES: 15 mm short axis reniform homogeneously enhancing lymph
node. Additional smaller lymph nodes.

VASCULAR: Trace calcific atherosclerosis aortic arch.

LIMITED INTRACRANIAL: Normal.

VISUALIZED ORBITS: Normal.

MASTOIDS AND VISUALIZED PARANASAL SINUSES: Mild paranasal sinus
mucosal thickening. Mastoid air cells are well aerated.

SKELETON: Nonacute. Poor dentition with multiple dental caries and
periapical abscess. Degenerative changes cervical spine superimposed
on congenital canal narrowing. Moderate canal stenosis C5-6.

UPPER CHEST: Lung apices are clear. No superior mediastinal
lymphadenopathy.

OTHER: None.
IMPRESSION: 1. Acute tonsillitis superimposed on tonsillar hypertrophy. 6 x 7 x
13 mm LEFT tonsillar abscess.
2. Narrowed oropharynx, patent airway.
3. Reactive LEFT lymphadenopathy.

Aortic Atherosclerosis (K41XT-0XA.A).

## 2019-04-30 NOTE — Progress Notes (Signed)
Patient ID: Ann Foster, female   DOB: [birth year], 53 y.o.   MRN: [id number]    Ann Foster, is a 53 y.o. female  GGE:[id number]  [id number]  DOB - 01-Aug-1965  Subjective:  Chief Complaint and HPI: Ann Foster is a 53 y.o. female here today to establish care and for a follow up visit afetr being seen and diagnosed with DM 04/20/2019 in the ED.  She presented with fatigue and hyperglycemia.  A1C=12.0. they started her on metformin '500mg'$  bid.  She is tolerating that well.  Her blood sugars have been running 150-200.    ED/Hospital notes reviewed.    ROS:   Constitutional:  No f/c, No night sweats, No unexplained weight loss. EENT:  No vision changes, No blurry vision, No hearing changes. No mouth, throat, or ear problems.  Respiratory: No cough, No SOB Cardiac: No CP, no palpitations GI:  No abd pain, No N/V/D. GU: No Urinary s/sx Musculoskeletal: No joint pain Neuro: No headache, no dizziness, no motor weakness.  Skin: No rash Endocrine:  No polydipsia. No polyuria.  Psych: Denies SI/HI  No problems updated.  ALLERGIES: No Known Allergies  PAST MEDICAL HISTORY: Past Medical History:  Diagnosis Date  . Ankle fracture 2016   Right  . Aortic atherosclerosis (HCC)    trace calcific atherosclerosis aortic arch per ct neck done 12-22-17  . Bronchitis   . Diabetes mellitus without complication ([place])   . OA (osteoarthritis) of shoulder    left shoulder, both knees arthritis    MEDICATIONS AT HOME: Prior to Admission medications   Medication Sig Start Date End Date Taking? Authorizing Provider  ibuprofen (ADVIL,MOTRIN) 200 MG tablet Take 400-600 mg by mouth every 6 (six) hours as needed for headache (pain).   Yes [provider]  metFORMIN (GLUCOPHAGE) 500 MG tablet Take 1 tablet (500 mg total) by mouth 2 (two) times daily with a meal. 05/01/19  Yes ,  M, PA-C  benzonatate (TESSALON) 100 MG capsule Take 1 capsule (100 mg total) by mouth every 8 (eight)  hours. Patient not taking: Reported on 05/01/2019 06/25/18   Deno Etienne, DO  Blood Glucose Monitoring Suppl (TRUE METRIX METER) w/Device KIT 1 each by Does not apply route 2 (two) times daily. 05/01/19   Argentina Donovan, PA-C  glucose blood (TRUE METRIX BLOOD GLUCOSE TEST) test strip Use as instructed 05/01/19   Argentina Donovan, PA-C  lisinopril (ZESTRIL) 5 MG tablet Take 1 tablet (5 mg total) by mouth daily. 05/01/19   Argentina Donovan, PA-C  ondansetron (ZOFRAN ODT) 4 MG disintegrating tablet Take 1 tablet (4 mg total) by mouth every 8 (eight) hours as needed for nausea or vomiting. Patient not taking: Reported on 05/01/2019 06/25/18   Deno Etienne, DO  TRUEplus Lancets 28G MISC 1 each by Does not apply route 2 (two) times daily. 05/01/19   Argentina Donovan, PA-C     Objective:  EXAM:   Vitals:   05/01/19 1522  BP: 132/86  Pulse: 76  Temp: 98.8 F (37.1 C)  TempSrc: Oral  SpO2: 97%  Weight: 193 lb (87.5 kg)    General appearance : A&OX3. NAD. Non-toxic-appearing HEENT: Atraumatic and Normocephalic.  PERRLA. EOM intact.   Neck: supple, no JVD. No cervical lymphadenopathy. No thyromegaly Chest/Lungs:  Breathing-non-labored, Good air entry bilaterally, breath sounds normal without rales, rhonchi, or wheezing  CVS: S1 S2 regular, no murmurs, gallops, rubs  Extremities: Bilateral Lower Ext shows no edema, both legs are warm to touch with =  pulse throughout Neurology:  CN II-XII grossly intact, Non focal.   Psych:  TP linear. J/I WNL. Normal speech. Appropriate eye contact and affect.  Skin:  No Rash  Data Review Lab Results  Component Value Date   HGBA1C 12.0 (H) 04/20/2019     Assessment & Plan   1. Type 2 diabetes mellitus with hyperglycemia, unspecified whether long term insulin use (HCC) Improving control-continue to watch diet and decrease glucose intake.  Increase dose of metformin bc tolerating well.  Increase water intake.  Also started lisinopril low dose for kidney  protection plus BP borderline to goal for DM - Glucose (CBG) - metFORMIN (GLUCOPHAGE) 500 MG tablet; Take 1 tablet (500 mg total) by mouth 2 (two) times daily with a meal.  Dispense: 120 tablet; Refill: 3 - lisinopril (ZESTRIL) 5 MG tablet; Take 1 tablet (5 mg total) by mouth daily.  Dispense: 90 tablet; Refill: 3 - glucose blood (TRUE METRIX BLOOD GLUCOSE TEST) test strip; Use as instructed  Dispense: 100 each; Refill: 12 - TRUEplus Lancets 28G MISC; 1 each by Does not apply route 2 (two) times daily.  Dispense: 100 each; Refill: 1 - Blood Glucose Monitoring Suppl (TRUE METRIX METER) w/Device KIT; 1 each by Does not apply route 2 (two) times daily.  Dispense: 1 kit; Refill: 0  2. Encounter for examination following treatment at hospital improving    Patient have been counseled extensively about nutrition and exercise  Return for 3 weeks with Luke-televisit for blood sugars and 6-8 weeks assign PCP OV.  The patient was given clear instructions to go to ER or return to medical center if symptoms don't improve, worsen or new problems develop. The patient verbalized understanding. The patient was told to call to get lab results if they haven't heard anything in the next week.     Freeman Caldron, PA-C [facility] and [facility], [place]   05/01/2019, 3:42 PM

## 2019-05-01 ENCOUNTER — Other Ambulatory Visit: Payer: Self-pay

## 2019-05-01 ENCOUNTER — Ambulatory Visit: Payer: Self-pay | Attending: Family Medicine | Admitting: Physician Assistant

## 2019-05-01 VITALS — BP 132/86 | HR 76 | Temp 98.8°F | Wt 193.0 lb

## 2019-05-01 DIAGNOSIS — Z09 Encounter for follow-up examination after completed treatment for conditions other than malignant neoplasm: Secondary | ICD-10-CM

## 2019-05-01 DIAGNOSIS — E1165 Type 2 diabetes mellitus with hyperglycemia: Secondary | ICD-10-CM

## 2019-05-01 LAB — GLUCOSE, POCT (MANUAL RESULT ENTRY): POC Glucose: 156 mg/dl — AB (ref 70–99)

## 2019-05-01 MED ORDER — METFORMIN HCL 500 MG PO TABS: 500.0000 mg | ORAL_TABLET | Freq: Two times a day (BID) | ORAL | 3 refills | Status: DC

## 2019-05-01 MED ORDER — TRUEPLUS LANCETS 28G MISC: 1.0000 | Freq: Two times a day (BID) | 1 refills | Status: DC

## 2019-05-01 MED ORDER — LISINOPRIL 5 MG PO TABS: 5.0000 mg | ORAL_TABLET | Freq: Every day | ORAL | 3 refills | Status: DC

## 2019-05-01 MED ORDER — TRUE METRIX METER W/DEVICE KIT: 1.0000 | PACK | Freq: Two times a day (BID) | 0 refills | Status: DC

## 2019-05-01 MED ORDER — TRUE METRIX BLOOD GLUCOSE TEST VI STRP: ORAL_STRIP | 12 refills | Status: DC

## 2019-05-01 MED FILL — !TRUE METRIX BLOOD GLUCOSE: 1 days supply | Qty: 1 | Fill #0

## 2019-05-01 MED FILL — TRUEplus LANCETS 28G MISC: 50 days supply | Qty: 100 | Fill #0

## 2019-05-01 MED FILL — TRUE METRIX TEST STRIP: 25 days supply | Qty: 100 | Fill #0

## 2019-05-01 MED FILL — metFORMIN HCL 500 MG TABS: 500 | 30 days supply | Qty: 60 | Fill #0

## 2019-05-01 MED FILL — LISINOPRIL 5 MG TABLET: 5 | 30 days supply | Qty: 30 | Fill #0

## 2019-05-01 NOTE — Patient Instructions (Signed)
Check blood sugars 2-3 times daily and record.  Have these ready for your telephone visit   Diabetes Mellitus and Nutrition, Adult When you have diabetes (diabetes mellitus), it is very important to have healthy eating habits because your blood sugar (glucose) levels are greatly affected by what you eat and drink. Eating healthy foods in the appropriate amounts, at about the same times every day, can help you:  Control your blood glucose.  Lower your risk of heart disease.  Improve your blood pressure.  Reach or maintain a healthy weight. Every person with diabetes is different, and each person has different needs for a meal plan. Your health care provider may recommend that you work with a diet and nutrition specialist (dietitian) to make a meal plan that is best for you. Your meal plan may vary depending on factors such as:  The calories you need.  The medicines you take.  Your weight.  Your blood glucose, blood pressure, and cholesterol levels.  Your activity level.  Other health conditions you have, such as heart or kidney disease. How do carbohydrates affect me? Carbohydrates, also called carbs, affect your blood glucose level more than any other type of food. Eating carbs naturally raises the amount of glucose in your blood. Carb counting is a method for keeping track of how many carbs you eat. Counting carbs is important to keep your blood glucose at a healthy level, especially if you use insulin or take certain oral diabetes medicines. It is important to know how many carbs you can safely have in each meal. This is different for every person. Your dietitian can help you calculate how many carbs you should have at each meal and for each snack. Foods that contain carbs include:  Bread, cereal, rice, pasta, and crackers.  Potatoes and corn.  Peas, beans, and lentils.  Milk and yogurt.  Fruit and juice.  Desserts, such as cakes, cookies, ice cream, and candy. How does  alcohol affect me? Alcohol can cause a sudden decrease in blood glucose (hypoglycemia), especially if you use insulin or take certain oral diabetes medicines. Hypoglycemia can be a life-threatening condition. Symptoms of hypoglycemia (sleepiness, dizziness, and confusion) are similar to symptoms of having too much alcohol. If your health care provider says that alcohol is safe for you, follow these guidelines:  Limit alcohol intake to no more than 1 drink per day for nonpregnant women and 2 drinks per day for men. One drink equals 12 oz of beer, 5 oz of wine, or 1 oz of hard liquor.  Do not drink on an empty stomach.  Keep yourself hydrated with water, diet soda, or unsweetened iced tea.  Keep in mind that regular soda, juice, and other mixers may contain a lot of sugar and must be counted as carbs. What are tips for following this plan?  Reading food labels  Start by checking the serving size on the "Nutrition Facts" label of packaged foods and drinks. The amount of calories, carbs, fats, and other nutrients listed on the label is based on one serving of the item. Many items contain more than one serving per package.  Check the total grams (g) of carbs in one serving. You can calculate the number of servings of carbs in one serving by dividing the total carbs by 15. For example, if a food has 30 g of total carbs, it would be equal to 2 servings of carbs.  Check the number of grams (g) of saturated and trans fats in  one serving. Choose foods that have low or no amount of these fats.  Check the number of milligrams (mg) of salt (sodium) in one serving. Most people should limit total sodium intake to less than 2,300 mg per day.  Always check the nutrition information of foods labeled as "low-fat" or "nonfat". These foods may be higher in added sugar or refined carbs and should be avoided.  Talk to your dietitian to identify your daily goals for nutrients listed on the label. Shopping   Avoid buying canned, premade, or processed foods. These foods tend to be high in fat, sodium, and added sugar.  Shop around the outside edge of the grocery store. This includes fresh fruits and vegetables, bulk grains, fresh meats, and fresh dairy. Cooking  Use low-heat cooking methods, such as baking, instead of high-heat cooking methods like deep frying.  Cook using healthy oils, such as olive, canola, or sunflower oil.  Avoid cooking with butter, cream, or high-fat meats. Meal planning  Eat meals and snacks regularly, preferably at the same times every day. Avoid going long periods of time without eating.  Eat foods high in fiber, such as fresh fruits, vegetables, beans, and whole grains. Talk to your dietitian about how many servings of carbs you can eat at each meal.  Eat 4-6 ounces (oz) of lean protein each day, such as lean meat, chicken, fish, eggs, or tofu. One oz of lean protein is equal to: ? 1 oz of meat, chicken, or fish. ? 1 egg. ?  cup of tofu.  Eat some foods each day that contain healthy fats, such as avocado, nuts, seeds, and fish. Lifestyle  Check your blood glucose regularly.  Exercise regularly as told by your health care provider. This may include: ? 150 minutes of moderate-intensity or vigorous-intensity exercise each week. This could be brisk walking, biking, or water aerobics. ? Stretching and doing strength exercises, such as yoga or weightlifting, at least 2 times a week.  Take medicines as told by your health care provider.  Do not use any products that contain nicotine or tobacco, such as cigarettes and e-cigarettes. If you need help quitting, ask your health care provider.  Work with a Social worker or diabetes educator to identify strategies to manage stress and any emotional and social challenges. Questions to ask a health care provider  Do I need to meet with a diabetes educator?  Do I need to meet with a dietitian?  What number can I call if I  have questions?  When are the best times to check my blood glucose? Where to find more information:  American Diabetes Association: diabetes.org  Academy of Nutrition and Dietetics: www.eatright.[employer] of Diabetes and Digestive and Kidney Diseases (NIH): [employer] Summary  A healthy meal plan will help you control your blood glucose and maintain a healthy lifestyle.  Working with a diet and nutrition specialist (dietitian) can help you make a meal plan that is best for you.  Keep in mind that carbohydrates (carbs) and alcohol have immediate effects on your blood glucose levels. It is important to count carbs and to use alcohol carefully. This information is not intended to replace advice given to you by your health care provider. Make sure you discuss any questions you have with your health care provider. Document Released: 02/10/2005 Document Revised: 04/28/2017 Document Reviewed: 06/20/2016 Elsevier Patient Education  2020 [employer].

## 2019-05-22 ENCOUNTER — Ambulatory Visit: Payer: Self-pay | Attending: Family Medicine | Admitting: Pharmacist

## 2019-05-22 ENCOUNTER — Other Ambulatory Visit: Payer: Self-pay

## 2019-05-22 ENCOUNTER — Encounter: Payer: Self-pay | Admitting: Pharmacist

## 2019-05-22 VITALS — BP 122/78 | HR 89

## 2019-05-22 DIAGNOSIS — E1165 Type 2 diabetes mellitus with hyperglycemia: Secondary | ICD-10-CM

## 2019-05-22 MED ORDER — ATORVASTATIN CALCIUM 10 MG PO TABS: 10.0000 mg | ORAL_TABLET | Freq: Every day | ORAL | 3 refills | Status: DC

## 2019-05-22 MED ORDER — METFORMIN HCL 500 MG PO TABS: 1000.0000 mg | ORAL_TABLET | Freq: Two times a day (BID) | ORAL | 2 refills | Status: DC

## 2019-05-22 MED ORDER — LISINOPRIL 2.5 MG PO TABS: 2.5000 mg | ORAL_TABLET | Freq: Every day | ORAL | 2 refills | Status: DC

## 2019-05-22 MED FILL — LISINOPRIL 2.5 MG TABLET: 2.5 | 30 days supply | Qty: 30 | Fill #0

## 2019-05-22 MED FILL — metFORMIN HCL 500 MG TABS: 500 | 30 days supply | Qty: 120 | Fill #0

## 2019-05-22 MED FILL — ATORVASTATIN 10 MG TABLET: 10 | 30 days supply | Qty: 30 | Fill #0

## 2019-05-22 NOTE — Patient Instructions (Signed)
Thank you for coming to see me today. Please do the following:  1. Increase metformin to 2 tablets twice a day with meals.  2. Stop lisinopril 5 mg and start taking 2.5 mg daily. Take before bedtime.  3. Start atorvastatin 10 mg daily. Take in the evening.  4. Continue checking blood sugars at home.   5. Continue making the lifestyle changes we've discussed together during our visit. Diet and exercise play a significant role in improving your blood sugars.  6. Follow-up with your PCP in January.    Hypoglycemia or low blood sugar:   Low blood sugar can happen quickly and may become an emergency if not treated right away.   While this shouldn't happen often, it can be brought upon if you skip a meal or do not eat enough. Also, if your insulin or other diabetes medications are dosed too high, this can cause your blood sugar to go to low.   Warning signs of low blood sugar include: 1. Feeling shaky or dizzy 2. Feeling weak or tired  3. Excessive hunger 4. Feeling anxious or upset  5. Sweating even when you aren't exercising  What to do if I experience low blood sugar? 1. Check your blood sugar with your meter. If lower than 70, proceed to step 2.  2. Treat with 3-4 glucose tablets or 3 packets of regular sugar. If these aren't around, you can try hard candy. Yet another option would be to drink 4 ounces of fruit juice or 6 ounces of REGULAR soda.  3. Re-check your sugar in 15 minutes. If it is still below 70, do what you did in step 2 again. If has come back up, go ahead and eat a snack or small meal at this time.

## 2019-05-22 NOTE — Progress Notes (Signed)
    S:    PCP: Zelda  No chief complaint on file.  Patient arrives in good spirits.  Presents for diabetes evaluation, education, and management.   Patient was referred on 05/01/19 by Levada Dy for a HFU.    Patient reports Diabetes was diagnosed in November. She presented to the ED 04/20/19 with complaints of fatigue, polyuria and polydipsia. Glucose was 325. A1c was taken and found to be elevated at 12.0. Pt has a hx of pre-DM and was started on metformin 500 mg BID.   Family/Social History:  - FHx: DM in son - Tobacco: current 0.3 pack someday smoker - Alcohol: occasional use   Insurance coverage/medication affordability: self-pay  Patient reports adherence with medications.  Current diabetes medications include: metformin 500 mg BID Current hypertension medications include: lisinopril 5 mg daily qAM Current hyperlipidemia medications include: none   Patient denies hypoglycemic events.  Patient reported dietary habits:  - Pt reports eliminating sugar from her diet - She is trying to limit carbohydrates as well   Patient-reported exercise habits:  - Walks daily    Patient denies current polydipsia or polyuria. Patient reports neuropathy (nerve pain). Patient reports visual changes. Patient reports self foot exams.     O:   Lab Results  Component Value Date   HGBA1C 12.0 (H) 04/20/2019   There were no vitals filed for this visit.  Lipid Panel  No results found for: CHOL, TRIG, HDL, CHOLHDL, VLDL, LDLCALC, LDLDIRECT  Home fasting blood sugars: 130s - 140s  2 hour post-meal/random blood sugars: 143 - 163.  Clinical Atherosclerotic Cardiovascular Disease (ASCVD): No  The ASCVD Risk score Mikey Bussing DC Jr., et al., 2013) failed to calculate for the following reasons:   Cannot find a previous HDL lab   Cannot find a previous total cholesterol lab   A/P: Diabetes newly dx currently uncontrolled based on A1c. Reported home glycemic control is improving. Patient is able to  verbalize appropriate hypoglycemia management plan. Patient is adherent with medication. Control is suboptimal d/t suboptimal medication regimen. -Increased dose of metformin to 1000 mg BID.  -Extensively discussed pathophysiology of diabetes, recommended lifestyle interventions, dietary effects on blood sugar control -Counseled on s/sx of and management of hypoglycemia -Next A1C anticipated 07/2019.  -Urine microalbumin:creatinine ratio  ASCVD risk - primary prevention in patient with diabetes. Pt needs lipid panel.  At least moderate intensity statin indicated. Will obtain lipid panel and intensify therapy as appropriate.  -Started atorvastatin 10 mg.  -Lipid panel  Hypertension currently controlled.  Blood pressure goal = <130/80 mmHg. Patient is adherent to lisinopril but reports feeling sluggish with headaches at home that started after starting lisinopril. Will reduce lisinopril to 2.5 mg daily. -Reduced dose of lisinopril to 2.5 mg daily.   HM:  -deferred flu - Pneumovax and Adacel indicated. Pt consented to and received both. Will coordinate with pharmacy for MAP.   Written patient instructions provided.  Total time in face to face counseling 30 minutes.   Follow up PCP Clinic Visit in Jan.  Benard Halsted, PharmD, [place] [phone]

## 2019-05-23 LAB — MICROALBUMIN / CREATININE URINE RATIO
Creatinine, Urine: 141.1 mg/dL
Microalb/Creat Ratio: 22 mg/g creat (ref 0–29)
Microalbumin, Urine: 30.7 ug/mL

## 2019-05-23 LAB — LIPID PANEL
Chol/HDL Ratio: 5.9 ratio — ABNORMAL HIGH (ref 0.0–4.4)
Cholesterol, Total: 236 mg/dL — ABNORMAL HIGH (ref 100–199)
HDL: 40 mg/dL (ref >39)
LDL Chol Calc (NIH): 173 mg/dL — ABNORMAL HIGH (ref 0–99)
Triglycerides: 124 mg/dL (ref 0–149)
VLDL Cholesterol Cal: 23 mg/dL (ref 5–40)

## 2019-05-25 ENCOUNTER — Other Ambulatory Visit: Payer: Self-pay | Admitting: Nurse Practitioner

## 2019-05-25 MED ORDER — ATORVASTATIN CALCIUM 20 MG PO TABS: 20.0000 mg | ORAL_TABLET | Freq: Every day | ORAL | 0 refills | Status: DC

## 2019-05-27 MED FILL — ATORVASTATIN CALCIUM 20 MG: 20 | 30 days supply | Qty: 30 | Fill #0

## 2019-06-12 ENCOUNTER — Ambulatory Visit: Payer: Self-pay | Admitting: Nurse Practitioner

## 2019-06-18 ENCOUNTER — Other Ambulatory Visit: Payer: Self-pay

## 2019-06-18 ENCOUNTER — Encounter: Payer: Self-pay | Admitting: Nurse Practitioner

## 2019-06-18 ENCOUNTER — Ambulatory Visit: Payer: Self-pay | Attending: Nurse Practitioner | Admitting: Nurse Practitioner

## 2019-06-18 DIAGNOSIS — E785 Hyperlipidemia, unspecified: Secondary | ICD-10-CM

## 2019-06-18 DIAGNOSIS — I10 Essential (primary) hypertension: Secondary | ICD-10-CM

## 2019-06-18 DIAGNOSIS — E1165 Type 2 diabetes mellitus with hyperglycemia: Secondary | ICD-10-CM

## 2019-06-18 DIAGNOSIS — F17200 Nicotine dependence, unspecified, uncomplicated: Secondary | ICD-10-CM

## 2019-06-18 DIAGNOSIS — Z7689 Persons encountering health services in other specified circumstances: Secondary | ICD-10-CM

## 2019-06-18 MED ORDER — TRUE METRIX BLOOD GLUCOSE TEST VI STRP: ORAL_STRIP | 6 refills | Status: DC

## 2019-06-18 MED ORDER — ATORVASTATIN CALCIUM 20 MG PO TABS: 20.0000 mg | ORAL_TABLET | Freq: Every day | ORAL | 0 refills | Status: DC

## 2019-06-18 MED ORDER — TRUEPLUS LANCETS 28G MISC: 1.0000 | Freq: Two times a day (BID) | 6 refills | Status: DC

## 2019-06-18 MED ORDER — METFORMIN HCL 500 MG PO TABS: 1000.0000 mg | ORAL_TABLET | Freq: Two times a day (BID) | ORAL | 0 refills | Status: DC

## 2019-06-18 MED ORDER — LISINOPRIL 2.5 MG PO TABS: 2.5000 mg | ORAL_TABLET | Freq: Two times a day (BID) | ORAL | 1 refills | Status: DC

## 2019-06-18 MED FILL — LISINOPRIL 2.5 MG TABLET: 2.5 | 30 days supply | Qty: 60 | Fill #0

## 2019-06-18 MED FILL — metFORMIN HCL 500 MG TABS: 500 | 30 days supply | Qty: 120 | Fill #0

## 2019-06-18 MED FILL — TRUEplus LANCETS 28G MISC: 50 days supply | Qty: 100 | Fill #0

## 2019-06-18 MED FILL — TRUE METRIX TEST STRIP: 50 days supply | Qty: 100 | Fill #0

## 2019-06-18 NOTE — Progress Notes (Signed)
Virtual Visit via Telephone Note Due to national recommendations of social distancing due to [place] 19, telehealth visit is felt to be most appropriate for this patient at this time.  I discussed the limitations, risks, security and privacy concerns of performing an evaluation and management service by telephone and the availability of in person appointments. I also discussed with the patient that there may be a patient responsible charge related to this service. The patient expressed understanding and agreed to proceed.    I connected with Ann Foster on 06/18/19  at   2:50 PM EST  EDT by telephone and verified that I am speaking with the correct person using two identifiers.   Consent I discussed the limitations, risks, security and privacy concerns of performing an evaluation and management service by telephone and the availability of in person appointments. I also discussed with the patient that there may be a patient responsible charge related to this service. The patient expressed understanding and agreed to proceed.   Location of Patient: Private  Residence   Location of Provider: [place] participating in Telemedicine visit: [name] FNP-BC [place]    History of Present Illness: Telemedicine visit for: Establish Ann Foster  has a past medical history of Ankle fracture (2016), Aortic atherosclerosis ([place]), Bronchitis, Diabetes mellitus without complication ([place]), Hypertension, and OA (osteoarthritis) of shoulder.   Health Maintenance Post menopausal 6 years- PAP overdue Mammogram overdue- Will refer to [facility] Has not seen a PCP in over 2 years.  She was diagnosed with diabetes on 04-20-2019.    DM TYPE 2 Monitoring blood glucose levels twice a day. Fasting 100s. Postprandial 115-120s. She denies any symptoms of hypo or hyperglycemia. She is overdue for eye exam. Taking metformin 1000 mg BID as prescribed.  Lab  Results  Component Value Date   HGBA1C 12.0 (H) 04/20/2019   Essential Hypertension She has a blood pressure device at home with reading this morning 140/75. States home readings have been similar to today's reading. She is currently taking lisinopril 2.5 mg. I instructed her that we need to increase it to 5 mg and she states when she was taking 5 mg it made her feel lightheaded. Will try to take 2.5 BID to see if this will help with BP lowering. Denies chest pain, shortness of breath, palpitations, lightheadedness, dizziness, headaches or BLE edema.   BP Readings from Last 3 Encounters:  05/22/19 122/78  05/01/19 132/86  04/20/19 (!) 144/95    Dyslipidemia Goal <70 LDL not at goal. Currently taking atorvastatin 20 mg daily. Denies statin intolerance or myalgias. Lab Results  Component Value Date   LDLCALC 173 (H) 05/22/2019    Past Medical History:  Diagnosis Date  . Ankle fracture 2016   Right  . Aortic atherosclerosis (HCC)    trace calcific atherosclerosis aortic arch per ct neck done 12-22-17  . Bronchitis   . Diabetes mellitus without complication ([place])   . Hypertension   . OA (osteoarthritis) of shoulder    left shoulder, both knees arthritis    Past Surgical History:  Procedure Laterality Date  . NO PAST SURGERIES      Family History  Problem Relation Age of Onset  . Cancer Mother   . Cancer Father   . Diabetes Son     Social History   Socioeconomic History  . Marital status: Single    Spouse name: Not on file  . Number of children: Not  on file  . Years of education: Not on file  . Highest education level: Not on file  Occupational History  . Not on file  Tobacco Use  . Smoking status: Current Some Day Smoker    Packs/day: 0.30    Types: Cigarettes  . Smokeless tobacco: Never Used  . Tobacco comment: stopped 2 months ago, smokes 1-2 cigarettes day now  Substance and Sexual Activity  . Alcohol use: Yes    Comment: Occasional  . Drug use: No  . Sexual  activity: Yes  Other Topics Concern  . Not on file  Social History Narrative  . Not on file   Social Determinants of Health   Financial Resource Strain:   . Difficulty of Paying Living Expenses: Not on file  Food Insecurity:   . Worried About Charity fundraiser in the Last Year: Not on file  . Ran Out of Food in the Last Year: Not on file  Transportation Needs:   . Lack of Transportation (Medical): Not on file  . Lack of Transportation (Non-Medical): Not on file  Physical Activity:   . Days of Exercise per Week: Not on file  . Minutes of Exercise per Session: Not on file  Stress:   . Feeling of Stress : Not on file  Social Connections:   . Frequency of Communication with Friends and Family: Not on file  . Frequency of Social Gatherings with Friends and Family: Not on file  . Attends Religious Services: Not on file  . Active Member of Clubs or Organizations: Not on file  . Attends Archivist Meetings: Not on file  . Marital Status: Not on file     Observations/Objective: Awake, alert and oriented x 3   Review of Systems  Constitutional: Negative for fever, malaise/fatigue and weight loss.  HENT: Negative.  Negative for nosebleeds.   Eyes: Negative.  Negative for blurred vision, double vision and photophobia.  Respiratory: Negative.  Negative for cough and shortness of breath.   Cardiovascular: Negative.  Negative for chest pain, palpitations and leg swelling.  Gastrointestinal: Negative.  Negative for heartburn, nausea and vomiting.  Musculoskeletal: Negative.  Negative for myalgias.  Neurological: Negative.  Negative for dizziness, focal weakness, seizures and headaches.  Psychiatric/Behavioral: Negative.  Negative for suicidal ideas.    Assessment and Plan: Ann Foster was seen today for establish care.  Diagnoses and all orders for this visit:  Encounter to establish care  Type 2 diabetes mellitus with hyperglycemia, unspecified whether long term insulin  use (HCC) -     lisinopril (ZESTRIL) 2.5 MG tablet; Take 1 tablet (2.5 mg total) by mouth 2 (two) times daily. -     glucose blood (TRUE METRIX BLOOD GLUCOSE TEST) test strip; Use as instructed. Check blood glucose level by fingerstick twice per day. E11.65 -     metFORMIN (GLUCOPHAGE) 500 MG tablet; Take 2 tablets (1,000 mg total) by mouth 2 (two) times daily with a meal. -     TRUEplus Lancets 28G MISC; 1 each by Does not apply route 2 (two) times daily. E11.65 Continue blood sugar control as discussed in office today, low carbohydrate diet, and regular physical exercise as tolerated, 150 minutes per week (30 min each day, 5 days per week, or 50 min 3 days per week). Keep blood sugar logs with fasting goal of 90-130 mg/dl, post prandial (after you eat) less than 180.  For Hypoglycemia: BS <60 and Hyperglycemia BS >400; contact the clinic ASAP. Annual eye exams  and foot exams are recommended.   Tobacco dependence Ann Foster was counseled on the dangers of tobacco use, and was advised to quit. Reviewed strategies to maximize success, including removing cigarettes and smoking materials from environment, stress management and support of family/friends as well as pharmacological alternatives including: Wellbutrin, Chantix, Nicotine patch, Nicotine gum or lozenges. Smoking cessation support: smoking cessation hotline: 1-800-QUIT-NOW.  Smoking cessation classes are also available through [facility]. Call [phone] or visit our website at [email or web address]   A total of 2 minutes was spent on counseling for smoking cessation and Ann Foster is ready to quit.    Dyslipidemia, goal LDL below 70 -     atorvastatin (LIPITOR) 20 MG tablet; Take 1 tablet (20 mg total) by mouth daily. INSTRUCTIONS: Work on a low fat, heart healthy diet and participate in regular aerobic exercise program by working out at least 150 minutes per week; 5 days a week-30 minutes per day. Avoid red  meat/beef/steak,  fried foods. junk foods, sodas, sugary drinks, unhealthy snacking, alcohol and smoking.  Drink at least 80 oz of water per day and monitor your carbohydrate intake daily.   Essential hypertension -     lisinopril (ZESTRIL) 2.5 MG tablet; Take 1 tablet (2.5 mg total) by mouth 2 (two) times daily. Continue all antihypertensives as prescribed.  Remember to bring in your blood pressure log with you for your follow up appointment.  DASH/Mediterranean Diets are healthier choices for HTN.       Follow Up Instructions Return for PAP SMEAR.     I discussed the assessment and treatment plan with the patient. The patient was provided an opportunity to ask questions and all were answered. The patient agreed with the plan and demonstrated an understanding of the instructions.   The patient was advised to call back or seek an in-person evaluation if the symptoms worsen or if the condition fails to improve as anticipated.  I provided 16 minutes of non-face-to-face time during this encounter including median intraservice time, reviewing previous notes, labs, imaging, medications and explaining diagnosis and management.  Gildardo Pounds, FNP-BC

## 2019-06-22 ENCOUNTER — Encounter (HOSPITAL_COMMUNITY): Payer: Self-pay | Admitting: Emergency Medicine

## 2019-06-22 ENCOUNTER — Emergency Department (HOSPITAL_COMMUNITY)
Admission: EM | Admit: 2019-06-22 | Discharge: 2019-06-22 | Disposition: A | Discharge status: Home / Self Care | Payer: Self-pay | Attending: Emergency Medicine | Admitting: Emergency Medicine

## 2019-06-22 ENCOUNTER — Other Ambulatory Visit: Payer: Self-pay

## 2019-06-22 DIAGNOSIS — R432 Parageusia: Secondary | ICD-10-CM

## 2019-06-22 DIAGNOSIS — Z79899 Other long term (current) drug therapy: Secondary | ICD-10-CM | POA: Insufficient documentation

## 2019-06-22 DIAGNOSIS — U071 COVID-19: Secondary | ICD-10-CM | POA: Insufficient documentation

## 2019-06-22 DIAGNOSIS — E119 Type 2 diabetes mellitus without complications: Secondary | ICD-10-CM | POA: Insufficient documentation

## 2019-06-22 DIAGNOSIS — F17210 Nicotine dependence, cigarettes, uncomplicated: Secondary | ICD-10-CM | POA: Insufficient documentation

## 2019-06-22 DIAGNOSIS — I10 Essential (primary) hypertension: Secondary | ICD-10-CM | POA: Insufficient documentation

## 2019-06-22 NOTE — ED Provider Notes (Signed)
[facility] DEPT Provider Note   CSN: [id number] Arrival date & time: 06/22/19  1926     History Chief Complaint  Patient presents with  . loss of taste    Ann Foster is a 54 y.o. female who presents for evaluation of loss of taste that began today.  Patient states she has no other symptoms and she has not had any cough, congestion, fever, difficulty breathing, chest pain, vomiting/diarrhea.  Patient states that she can still smell slightly but states that she cannot taste anything.  She reports other members of her family have similar symptoms.  She has not any known COVID-19 exposure denies any travel.  The history is provided by the patient.       Past Medical History:  Diagnosis Date  . Ankle fracture 2016   Right  . Aortic atherosclerosis (HCC)    trace calcific atherosclerosis aortic arch per ct neck done 12-22-17  . Bronchitis   . Diabetes mellitus without complication ([place])   . Hypertension   . OA (osteoarthritis) of shoulder    left shoulder, both knees arthritis    Patient Active Problem List   Diagnosis Date Noted  . Impingement syndrome of left shoulder 01/23/2018    Past Surgical History:  Procedure Laterality Date  . NO PAST SURGERIES       OB History   No obstetric history on file.     Family History  Problem Relation Age of Onset  . Cancer Mother   . Cancer Father   . Diabetes Son     Social History   Tobacco Use  . Smoking status: Current Some Day Smoker    Packs/day: 0.30    Types: Cigarettes  . Smokeless tobacco: Never Used  . Tobacco comment: stopped 2 months ago, smokes 1-2 cigarettes day now  Substance Use Topics  . Alcohol use: Yes    Comment: Occasional  . Drug use: No    Home Medications Prior to Admission medications   Medication Sig Start Date End Date Taking? Authorizing Provider  atorvastatin (LIPITOR) 20 MG tablet Take 1 tablet (20 mg total) by mouth daily. 06/18/19 09/16/19  Gildardo Pounds, NP  Blood Glucose Monitoring Suppl (TRUE METRIX METER) w/Device KIT 1 each by Does not apply route 2 (two) times daily. 05/01/19   Argentina Donovan, PA-C  glucose blood (TRUE METRIX BLOOD GLUCOSE TEST) test strip Use as instructed. Check blood glucose level by fingerstick twice per day. E11.65 06/18/19   Gildardo Pounds, NP  ibuprofen (ADVIL,MOTRIN) 200 MG tablet Take 400-600 mg by mouth every 6 (six) hours as needed for headache (pain).    [provider]  lisinopril (ZESTRIL) 2.5 MG tablet Take 1 tablet (2.5 mg total) by mouth 2 (two) times daily. 06/18/19 07/18/19  Gildardo Pounds, NP  metFORMIN (GLUCOPHAGE) 500 MG tablet Take 2 tablets (1,000 mg total) by mouth 2 (two) times daily with a meal. 06/18/19 09/16/19  Gildardo Pounds, NP  TRUEplus Lancets 28G MISC 1 each by Does not apply route 2 (two) times daily. E11.65 06/18/19   Gildardo Pounds, NP    Allergies    Patient has no known allergies.  Review of Systems   Review of Systems  Constitutional: Negative for fever.  HENT:       Loss of taste  Respiratory: Negative for shortness of breath.   Cardiovascular: Negative for chest pain.  Gastrointestinal: Negative for abdominal pain, nausea and vomiting.  All other  systems reviewed and are negative.   Physical Exam Updated Vital Signs BP (!) 158/97 (BP Location: Left Arm)   Pulse 87   Temp 98.9 F (37.2 C) (Oral)   Resp 18   Ht _0  (1.575 m)   Wt 88 kg   SpO2 100%   BMI 35.48 kg/m   Physical Exam Vitals and nursing note reviewed.  Constitutional:      Appearance: She is well-developed.  HENT:     Head: Normocephalic and atraumatic.  Eyes:     General: No scleral icterus.       Right eye: No discharge.        Left eye: No discharge.     Conjunctiva/sclera: Conjunctivae normal.  Pulmonary:     Effort: Pulmonary effort is normal.     Comments: Lungs clear to auscultation bilaterally.  Symmetric chest rise.  No wheezing, rales, rhonchi. Skin:     General: Skin is warm and dry.  Neurological:     Mental Status: She is alert.  Psychiatric:        Speech: Speech normal.        Behavior: Behavior normal.     ED Results / Procedures / Treatments   Labs (all labs ordered are listed, but only abnormal results are displayed) Labs Reviewed  SARS CORONAVIRUS 2 (TAT 6-24 HRS)    EKG None  Radiology No results found.  Procedures Procedures (including critical care time)  Medications Ordered in ED Medications - No data to display  ED Course  I have reviewed the triage vital signs and the nursing notes.  Pertinent labs & imaging results that were available during my care of the patient were reviewed by me and considered in my medical decision making (see chart for details).    MDM Rules/Calculators/A&P                      54 year old female who presents for evaluation of loss of taste that began today.  No other symptoms.  Family members at home with same.  No known COVID-19 exposure.  On initial ED arrival, she is afebrile, nontoxic-appearing.  Vital signs are stable.  On exam, lungs clear to auscultation.  Concern for possible COVID-19 exposure.  We will plan for COVID-19 swab.  Instructed patient on at home supportive care measures.  Patient is hemodynamically stable with no signs of hypoxia. At this time, patient exhibits no emergent life-threatening condition that require further evaluation in ED or admission. Patient had ample opportunity for questions and discussion. All patient's questions were answered with full understanding. Strict return precautions discussed. Patient expresses understanding and agreement to plan.   Candies Palm was evaluated in Emergency Department on 06/22/2019 for the symptoms described in the history of present illness. She was evaluated in the context of the global COVID-19 pandemic, which necessitated consideration that the patient might be at risk for infection with the SARS-CoV-2 virus that causes  COVID-19. Institutional protocols and algorithms that pertain to the evaluation of patients at risk for COVID-19 are in a state of rapid change based on information released by regulatory bodies including the CDC and federal and state organizations. These policies and algorithms were followed during the patient's care in the ED.  Portions of this note were generated with [employer]. Dictation errors may occur despite best attempts at proofreading.   Final Clinical Impression(s) / ED Diagnoses Final diagnoses:  Loss of taste    Rx / DC Orders ED  Discharge Orders    None       Desma Mcgregor 06/22/19 2237    Malvin Johns, MD 06/22/19 2302

## 2019-06-22 NOTE — Discharge Instructions (Signed)
As we discussed, you have been tested for COVID-19.  The results will take about 24 hours to come back.  You can check online or MyChart regarding results.  If you are positive, you will need to quarantine for 2 weeks.  Needed quarantine until results come back.  If you are positive, make sure drink plenty of fluids and staying hydrated.  Return the emergency department for any difficulty breathing, vomiting, chest pain or any other worsening concerning symptoms.

## 2019-06-23 LAB — SARS CORONAVIRUS 2 (TAT 6-24 HRS): SARS Coronavirus 2: POSITIVE — AB

## 2019-06-24 ENCOUNTER — Telehealth: Payer: Self-pay | Admitting: Nurse Practitioner

## 2019-06-24 NOTE — Telephone Encounter (Signed)
Called to Discuss with patient about Covid symptoms and the use of bamlanivimab, a monoclonal antibody infusion for those with mild to moderate Covid symptoms and at a high risk of hospitalization.     Pt is qualified for this infusion at the [facility] infusion center due to co-morbid conditions and/or a member of an at-risk group.    Patient states that she is not having any symptoms. Symptoms tier reviewed as well as criteria for ending isolation. Preventative practices reviewed. Patient verbalized understanding.    Patient advised to call back if she does develop symptoms and decides that she does want to get infusion. Callback number to the infusion center given. Patient advised to go to Urgent care or ED with severe symptoms.

## 2019-06-26 ENCOUNTER — Ambulatory Visit: Payer: Self-pay

## 2019-07-02 ENCOUNTER — Encounter: Payer: Self-pay | Admitting: Nurse Practitioner

## 2019-07-17 ENCOUNTER — Other Ambulatory Visit: Payer: Self-pay

## 2019-07-17 ENCOUNTER — Ambulatory Visit: Payer: Self-pay | Attending: Nurse Practitioner

## 2019-07-25 ENCOUNTER — Telehealth: Payer: Self-pay | Admitting: Nurse Practitioner

## 2019-07-25 NOTE — Telephone Encounter (Signed)
Pt was sent a letter from financial dept. Inform them, that the application they submitted was incomplete, since they were missing some documentation at the time of the appointment, Pt need to reschedule and resubmit all new papers and application for CAFA and OC, P.S. old documents has been sent back by mail to the Pt and Pt. need to make a new appt. 

## 2019-07-29 ENCOUNTER — Ambulatory Visit: Payer: Self-pay | Attending: Nurse Practitioner | Admitting: Nurse Practitioner

## 2019-07-29 ENCOUNTER — Encounter: Payer: Self-pay | Admitting: Nurse Practitioner

## 2019-07-29 ENCOUNTER — Other Ambulatory Visit: Payer: Self-pay

## 2019-07-29 VITALS — BP 125/63 | HR 85 | Temp 98.1°F | Wt 185.0 lb

## 2019-07-29 DIAGNOSIS — E1165 Type 2 diabetes mellitus with hyperglycemia: Secondary | ICD-10-CM

## 2019-07-29 DIAGNOSIS — Z1211 Encounter for screening for malignant neoplasm of colon: Secondary | ICD-10-CM

## 2019-07-29 DIAGNOSIS — Z124 Encounter for screening for malignant neoplasm of cervix: Secondary | ICD-10-CM

## 2019-07-29 DIAGNOSIS — Z1159 Encounter for screening for other viral diseases: Secondary | ICD-10-CM

## 2019-07-29 LAB — GLUCOSE, POCT (MANUAL RESULT ENTRY): POC Glucose: 164 mg/dl — AB (ref 70–99)

## 2019-07-29 MED FILL — LISINOPRIL 2.5 MG TABLET: 2.5 | 30 days supply | Qty: 60 | Fill #1

## 2019-07-29 MED FILL — TRUE METRIX TEST STRIP: 50 days supply | Qty: 100 | Fill #1

## 2019-07-29 MED FILL — ATORVASTATIN CALCIUM 20 MG: 20 | 30 days supply | Qty: 30 | Fill #1

## 2019-07-29 NOTE — Progress Notes (Signed)
Assessment & Plan:  Ann Foster was seen today for gynecologic exam.  Diagnoses and all orders for this visit:  Encounter for Papanicolaou smear for cervical cancer screening -     Cytology - PAP([place]) -     Cervicovaginal ancillary only  Colon cancer screening -     Fecal occult blood, imunochemical([facility])  Type 2 diabetes mellitus with hyperglycemia, unspecified whether long term insulin use (HCC) -     Glucose (CBG) -     Hemoglobin A1c Continue blood sugar control as discussed in office today, low carbohydrate diet, and regular physical exercise as tolerated, 150 minutes per week (30 min each day, 5 days per week, or 50 min 3 days per week). Keep blood sugar logs with fasting goal of 90-130 mg/dl, post prandial (after you eat) less than 180.  For Hypoglycemia: BS <60 and Hyperglycemia BS >400; contact the clinic ASAP. Annual eye exams and foot exams are recommended.   Encounter for hepatitis C screening test for low risk patient -     Hepatitis C Antibody    Patient has been counseled on age-appropriate routine health concerns for screening and prevention. These are reviewed and up-to-date. Referrals have been placed accordingly. Immunizations are up-to-date or declined.    Subjective:   Chief Complaint  Patient presents with  . Gynecologic Exam    Pt. is here for a pap smear.    HPI Ann Foster 54 y.o. female presents to office today for PAP smear  Review of Systems  Constitutional: Negative.  Negative for chills, fever, malaise/fatigue and weight loss.  Respiratory: Negative.  Negative for cough, shortness of breath and wheezing.   Cardiovascular: Negative.  Negative for chest pain, orthopnea and leg swelling.  Gastrointestinal: Negative for abdominal pain.  Genitourinary: Negative.  Negative for flank pain.  Skin: Negative.  Negative for rash.  Psychiatric/Behavioral: Negative for suicidal ideas.    Past Medical History:  Diagnosis Date  . Ankle  fracture 2016   Right  . Aortic atherosclerosis (HCC)    trace calcific atherosclerosis aortic arch per ct neck done 12-22-17  . Bronchitis   . Diabetes mellitus without complication ([place])   . Hypertension   . OA (osteoarthritis) of shoulder    left shoulder, both knees arthritis    Past Surgical History:  Procedure Laterality Date  . NO PAST SURGERIES      Family History  Problem Relation Age of Onset  . Cancer Mother   . Cancer Father   . Diabetes Son     Social History Reviewed with no changes to be made today.   Outpatient Medications Prior to Visit  Medication Sig Dispense Refill  . atorvastatin (LIPITOR) 20 MG tablet Take 1 tablet (20 mg total) by mouth daily. 90 tablet 0  . Blood Glucose Monitoring Suppl (TRUE METRIX METER) w/Device KIT 1 each by Does not apply route 2 (two) times daily. 1 kit 0  . glucose blood (TRUE METRIX BLOOD GLUCOSE TEST) test strip Use as instructed. Check blood glucose level by fingerstick twice per day. E11.65 200 each 6  . ibuprofen (ADVIL,MOTRIN) 200 MG tablet Take 400-600 mg by mouth every 6 (six) hours as needed for headache (pain).    . metFORMIN (GLUCOPHAGE) 500 MG tablet Take 2 tablets (1,000 mg total) by mouth 2 (two) times daily with a meal. 360 tablet 0  . TRUEplus Lancets 28G MISC 1 each by Does not apply route 2 (two) times daily. E11.65 200 each 6  .  lisinopril (ZESTRIL) 2.5 MG tablet Take 1 tablet (2.5 mg total) by mouth 2 (two) times daily. 60 tablet 1   No facility-administered medications prior to visit.    No Known Allergies     Objective:    BP 125/63 (BP Location: Left Arm, Patient Position: Sitting, Cuff Size: Large)   Pulse 85   Temp 98.1 F (36.7 C) (Temporal)   Wt 185 lb (83.9 kg)   SpO2 99%   BMI 33.84 kg/m  Wt Readings from Last 3 Encounters:  07/29/19 185 lb (83.9 kg)  06/22/19 194 lb (88 kg)  05/01/19 193 lb (87.5 kg)    Physical Exam Constitutional:      Appearance: She is well-developed.  HENT:       Head: Normocephalic.  Cardiovascular:     Rate and Rhythm: Normal rate and regular rhythm.     Heart sounds: Normal heart sounds.  Pulmonary:     Effort: Pulmonary effort is normal.     Breath sounds: Normal breath sounds.  Abdominal:     General: Bowel sounds are normal.     Palpations: Abdomen is soft.     Hernia: There is no hernia in the left inguinal area.  Genitourinary:    Labia:        Right: No rash, tenderness, lesion or injury.        Left: No rash, tenderness, lesion or injury.      Vagina: Normal. No signs of injury and foreign body. No vaginal discharge, erythema, tenderness or bleeding.     Cervix: No cervical motion tenderness or friability.     Uterus: Not deviated and not enlarged.      Adnexa:        Right: No mass, tenderness or fullness.         Left: No mass, tenderness or fullness.       Rectum: Normal. No external hemorrhoid.  Lymphadenopathy:     Lower Body: No right inguinal adenopathy. No left inguinal adenopathy.  Skin:    General: Skin is warm and dry.  Neurological:     Mental Status: She is alert and oriented to person, place, and time.  Psychiatric:        Behavior: Behavior normal.        Thought Content: Thought content normal.        Judgment: Judgment normal.        Patient has been counseled extensively about nutrition and exercise as well as the importance of adherence with medications and regular follow-up. The patient was given clear instructions to go to ER or return to medical center if symptoms don't improve, worsen or new problems develop. The patient verbalized understanding.   Follow-up: Return in about 3 months (around 10/29/2019).   Gildardo Pounds, FNP-BC [facility] [place], [place]   08/16/2019, 9:37 AM

## 2019-07-30 LAB — CERVICOVAGINAL ANCILLARY ONLY
Bacterial Vaginitis (gardnerella): POSITIVE — AB
Candida Glabrata: NEGATIVE
Candida Vaginitis: NEGATIVE
Chlamydia: NEGATIVE
Comment: NEGATIVE
Comment: NEGATIVE
Comment: NEGATIVE
Comment: NEGATIVE
Comment: NEGATIVE
Comment: NORMAL
Neisseria Gonorrhea: NEGATIVE
Trichomonas: NEGATIVE

## 2019-07-30 LAB — HEMOGLOBIN A1C
Est. average glucose Bld gHb Est-mCnc: 157 mg/dL
Hgb A1c MFr Bld: 7.1 % — ABNORMAL HIGH (ref 4.8–5.6)

## 2019-07-30 LAB — HEPATITIS C ANTIBODY: Hep C Virus Ab: <0.1 s/co ratio (ref 0.0–0.9)

## 2019-08-01 LAB — CYTOLOGY - PAP
Comment: NEGATIVE
Diagnosis: NEGATIVE
Diagnosis: REACTIVE
High risk HPV: NEGATIVE

## 2019-08-02 ENCOUNTER — Other Ambulatory Visit: Payer: Self-pay | Admitting: Nurse Practitioner

## 2019-08-02 MED ORDER — METRONIDAZOLE 500 MG PO TABS: 500.0000 mg | ORAL_TABLET | Freq: Two times a day (BID) | ORAL | 0 refills | Status: AC

## 2019-08-02 MED FILL — metroNIDAZOLE 500 MG TABS: 500 | 7 days supply | Qty: 14 | Fill #0

## 2019-08-05 ENCOUNTER — Other Ambulatory Visit: Payer: Self-pay

## 2019-08-05 DIAGNOSIS — Z1231 Encounter for screening mammogram for malignant neoplasm of breast: Secondary | ICD-10-CM

## 2019-08-16 ENCOUNTER — Encounter: Payer: Self-pay | Admitting: Nurse Practitioner

## 2019-08-20 ENCOUNTER — Encounter: Payer: Self-pay | Admitting: Nurse Practitioner

## 2019-08-20 ENCOUNTER — Other Ambulatory Visit: Payer: Self-pay

## 2019-08-20 ENCOUNTER — Ambulatory Visit: Payer: Self-pay | Attending: Nurse Practitioner | Admitting: Nurse Practitioner

## 2019-08-20 DIAGNOSIS — E1142 Type 2 diabetes mellitus with diabetic polyneuropathy: Secondary | ICD-10-CM

## 2019-08-20 DIAGNOSIS — F17200 Nicotine dependence, unspecified, uncomplicated: Secondary | ICD-10-CM

## 2019-08-20 DIAGNOSIS — E1165 Type 2 diabetes mellitus with hyperglycemia: Secondary | ICD-10-CM

## 2019-08-20 MED ORDER — GABAPENTIN 300 MG PO CAPS: 300.0000 mg | ORAL_CAPSULE | Freq: Every day | ORAL | 2 refills | Status: DC

## 2019-08-20 MED FILL — GABAPENTIN 300 MG CAPSULE: 300 | 30 days supply | Qty: 30 | Fill #0

## 2019-08-20 NOTE — Progress Notes (Signed)
Virtual Visit via Telephone Note Due to national recommendations of social distancing due to [place] 19, telehealth visit is felt to be most appropriate for this patient at this time.  I discussed the limitations, risks, security and privacy concerns of performing an evaluation and management service by telephone and the availability of in person appointments. I also discussed with the patient that there may be a patient responsible charge related to this service. The patient expressed understanding and agreed to proceed.    I connected with Ann Foster on 08/20/19  at   2:50 PM EDT  EDT by telephone and verified that I am speaking with the correct person using two identifiers.   Consent I discussed the limitations, risks, security and privacy concerns of performing an evaluation and management service by telephone and the availability of in person appointments. I also discussed with the patient that there may be a patient responsible charge related to this service. The patient expressed understanding and agreed to proceed.   Location of Patient: Private  Residence   Location of Provider: [place] participating in Telemedicine visit: [name] FNP-BC [place]    History of Present Illness: Telemedicine visit for: Peripheral Neuropathy Requesting completion of disability forms. This is the first time I have evaluated her for neuropathy.    Neuropathy  She describes symptoms of numbness, burning and tingling. Onset of symptoms was before she was diagnosed with DM 03-2019. Symptoms are currently of mild, moderate and intermittent severity. Symptoms occur intermittently and last hours. The patient denies cramping. Symptoms are worse in the bilateral feet.  She denies autonomic symptoms. Previous treatment has included NONE. Started wearing new orthotics today after going to a shoe store for fitting. A1c has been as high as 12.0.   Lab Results  Component Value Date   HGBA1C 7.1 (H) 07/29/2019   BP Readings from Last 3 Encounters:  07/29/19 125/63  06/22/19 (!) 158/97  05/22/19 122/78   Past Medical History:  Diagnosis Date  . Ankle fracture 2016   Right  . Aortic atherosclerosis (HCC)    trace calcific atherosclerosis aortic arch per ct neck done 12-22-17  . Bronchitis   . Diabetes mellitus without complication (Ann Foster)   . Hypertension   . OA (osteoarthritis) of shoulder    left shoulder, both knees arthritis    Past Surgical History:  Procedure Laterality Date  . NO PAST SURGERIES      Family History  Problem Relation Age of Onset  . Cancer Mother   . Cancer Father   . Diabetes Son     Social History   Socioeconomic History  . Marital status: Single    Spouse name: Not on file  . Number of children: Not on file  . Years of education: Not on file  . Highest education level: Not on file  Occupational History  . Not on file  Tobacco Use  . Smoking status: Current Some Day Smoker    Packs/day: 0.30    Types: Cigarettes  . Smokeless tobacco: Never Used  . Tobacco comment: stopped 2 months ago, smokes 1-2 cigarettes day now  Substance and Sexual Activity  . Alcohol use: Yes    Comment: Occasional  . Drug use: No  . Sexual activity: Yes  Other Topics Concern  . Not on file  Social History Narrative  . Not on file   Social Determinants of Health   Financial Resource Strain:   .  Difficulty of Paying Living Expenses:   Food Insecurity:   . Worried About Charity fundraiser in the Last Year:   . Arboriculturist in the Last Year:   Transportation Needs:   . Film/video editor (Medical):   Marland Kitchen Lack of Transportation (Non-Medical):   Physical Activity:   . Days of Exercise per Week:   . Minutes of Exercise per Session:   Stress:   . Feeling of Stress :   Social Connections:   . Frequency of Communication with Friends and Family:   . Frequency of Social Gatherings with Friends and  Family:   . Attends Religious Services:   . Active Member of Clubs or Organizations:   . Attends Archivist Meetings:   Marland Kitchen Marital Status:      Observations/Objective: Awake, alert and oriented x 3   Review of Systems  Constitutional: Negative for fever, malaise/fatigue and weight loss.  HENT: Negative.  Negative for nosebleeds.   Eyes: Negative.  Negative for blurred vision, double vision and photophobia.  Respiratory: Negative.  Negative for cough and shortness of breath.   Cardiovascular: Negative.  Negative for chest pain, palpitations and leg swelling.  Gastrointestinal: Negative.  Negative for heartburn, nausea and vomiting.  Musculoskeletal: Negative.  Negative for myalgias.  Neurological: Positive for sensory change. Negative for dizziness, focal weakness, seizures and headaches.  Psychiatric/Behavioral: Negative.  Negative for suicidal ideas.    Assessment and Plan: Ann Foster was seen today for foot pain.  Diagnoses and all orders for this visit:  Diabetic polyneuropathy associated with type 2 diabetes mellitus ([place]) -     Ambulatory referral to Podiatry -     gabapentin (NEURONTIN) 300 MG capsule; Take 1 capsule (300 mg total) by mouth at bedtime.  Type 2 diabetes mellitus with hyperglycemia, unspecified whether long term insulin use (HCC) -     Ambulatory referral to Podiatry -     gabapentin (NEURONTIN) 300 MG capsule; Take 1 capsule (300 mg total) by mouth at bedtime.  Tobacco dependence Ann Foster was counseled on the dangers of tobacco use, and was advised to quit. Reviewed strategies to maximize success, including removing cigarettes and smoking materials from environment, stress management and support of family/friends as well as pharmacological alternatives including: Wellbutrin, Chantix, Nicotine patch, Nicotine gum or lozenges. Smoking cessation support: smoking cessation hotline: 1-800-QUIT-NOW.  Smoking cessation classes are also available through [facility]. Call [phone] or visit our website at [email or web address]   Ann Foster is ready to quit.    Follow Up Instructions Return in about 4 weeks (around 09/17/2019).     I discussed the assessment and treatment plan with the patient. The patient was provided an opportunity to ask questions and all were answered. The patient agreed with the plan and demonstrated an understanding of the instructions.   The patient was advised to call back or seek an in-person evaluation if the symptoms worsen or if the condition fails to improve as anticipated.  I provided 17 minutes of non-face-to-face time during this encounter including median intraservice time, reviewing previous notes, labs, imaging, medications and explaining diagnosis and management.  Gildardo Pounds, FNP-BC

## 2019-08-21 ENCOUNTER — Other Ambulatory Visit: Payer: Self-pay

## 2019-08-21 ENCOUNTER — Ambulatory Visit: Payer: Self-pay | Attending: Nurse Practitioner

## 2019-08-21 DIAGNOSIS — I10 Essential (primary) hypertension: Secondary | ICD-10-CM

## 2019-08-21 DIAGNOSIS — E785 Hyperlipidemia, unspecified: Secondary | ICD-10-CM

## 2019-08-22 LAB — CBC
Hematocrit: 35.8 % (ref 34.0–46.6)
Hemoglobin: 12.5 g/dL (ref 11.1–15.9)
MCH: 32.3 pg (ref 26.6–33.0)
MCHC: 34.9 g/dL (ref 31.5–35.7)
MCV: 93 fL (ref 79–97)
Platelets: 349 10*3/uL (ref 150–450)
RBC: 3.87 x10E6/uL (ref 3.77–5.28)
RDW: 12.2 % (ref 11.7–15.4)
WBC: 9.4 10*3/uL (ref 3.4–10.8)

## 2019-08-22 LAB — CMP14+EGFR
ALT: 21 IU/L (ref 0–32)
AST: 13 IU/L (ref 0–40)
Albumin/Globulin Ratio: 1.5 (ref 1.2–2.2)
Albumin: 4.6 g/dL (ref 3.8–4.9)
Alkaline Phosphatase: 57 IU/L (ref 39–117)
BUN/Creatinine Ratio: 16 (ref 9–23)
BUN: 13 mg/dL (ref 6–24)
Bilirubin Total: 0.5 mg/dL (ref 0.0–1.2)
CO2: 23 mmol/L (ref 20–29)
Calcium: 10.6 mg/dL — ABNORMAL HIGH (ref 8.7–10.2)
Chloride: 102 mmol/L (ref 96–106)
Creatinine, Ser: 0.83 mg/dL (ref 0.57–1.00)
GFR calc Af Amer: 92 mL/min/{1.73_m2} (ref >59)
GFR calc non Af Amer: 80 mL/min/{1.73_m2} (ref >59)
Globulin, Total: 3.1 g/dL (ref 1.5–4.5)
Glucose: 93 mg/dL (ref 65–99)
Potassium: 4.3 mmol/L (ref 3.5–5.2)
Sodium: 141 mmol/L (ref 134–144)
Total Protein: 7.7 g/dL (ref 6.0–8.5)

## 2019-08-22 LAB — VITAMIN D 25 HYDROXY (VIT D DEFICIENCY, FRACTURES): Vit D, 25-Hydroxy: 26.5 ng/mL — ABNORMAL LOW (ref 30.0–100.0)

## 2019-08-22 LAB — LIPID PANEL
Chol/HDL Ratio: 4.1 ratio (ref 0.0–4.4)
Cholesterol, Total: 153 mg/dL (ref 100–199)
HDL: 37 mg/dL — ABNORMAL LOW (ref >39)
LDL Chol Calc (NIH): 95 mg/dL (ref 0–99)
Triglycerides: 118 mg/dL (ref 0–149)
VLDL Cholesterol Cal: 21 mg/dL (ref 5–40)

## 2019-08-26 MED FILL — LISINOPRIL 2.5 MG TABLET: 2.5 | 30 days supply | Qty: 30 | Fill #1

## 2019-08-27 ENCOUNTER — Ambulatory Visit: Payer: Self-pay | Admitting: Advanced Practice Midwife

## 2019-08-27 ENCOUNTER — Other Ambulatory Visit: Payer: Self-pay

## 2019-08-27 ENCOUNTER — Ambulatory Visit
Admission: RE | Admit: 2019-08-27 | Discharge: 2019-08-27 | Disposition: A | Discharge status: Home / Self Care | Payer: No Typology Code available for payment source | Source: Ambulatory Visit | Attending: Obstetrics and Gynecology | Admitting: Obstetrics and Gynecology

## 2019-08-27 VITALS — BP 134/66 | Temp 97.1°F | Wt 190.0 lb

## 2019-08-27 DIAGNOSIS — Z1231 Encounter for screening mammogram for malignant neoplasm of breast: Secondary | ICD-10-CM

## 2019-08-27 DIAGNOSIS — Z1239 Encounter for other screening for malignant neoplasm of breast: Secondary | ICD-10-CM

## 2019-08-27 NOTE — Progress Notes (Signed)
Ms. Sarajane Nguon is a 54 y.o. female who presents to [facility] clinic today with no complaints.    Pap Smear: Pap not smear completed today. Last Pap smear was 07/29/2019 at [facility] clinic and was normal. Per patient has no history of an abnormal Pap smear. Last Pap smear result is available in Epic.   Physical exam: Breasts Breasts symmetrical. No skin abnormalities bilateral breasts. No nipple retraction bilateral breasts. No nipple discharge bilateral breasts. No lymphadenopathy. No lumps palpated bilateral breasts.       Pelvic/Bimanual Pap is not indicated today    Smoking History: Patient has never smoked Not referred to quit line.    Patient Navigation: Patient education provided. Access to services provided for patient through [facility] program. No interpreter provided. No transportation provided   Colorectal Cancer Screening: Per patient has never had colonoscopy completed No complaints today.    Breast and Cervical Cancer Risk Assessment: Patient has family history of breast cancer, known genetic mutations, or radiation treatment to the chest before age 70. Patient does not have history of cervical dysplasia, immunocompromised, or DES exposure in-utero.  Risk Assessment    Risk Scores      08/27/2019   Last edited by: Demetrius Revel, LPN   5-year risk: 1.4 %   Lifetime risk: 7.8 %          A: B[facility]CP exam without pap smear Complaint of none  P: Referred patient to the [facility] for a screening mammogram. Appointment scheduled 08/27/2019 at 1530.  Marcille Buffy DNP, CNM  08/27/19  2:11 PM

## 2019-09-09 MED FILL — ?METFORMIN HCL 500MG TABLET: 500 | 30 days supply | Qty: 120 | Fill #1

## 2019-09-10 ENCOUNTER — Encounter: Payer: Self-pay | Admitting: Podiatry

## 2019-09-10 ENCOUNTER — Ambulatory Visit (INDEPENDENT_AMBULATORY_CARE_PROVIDER_SITE_OTHER): Payer: Self-pay

## 2019-09-10 ENCOUNTER — Other Ambulatory Visit: Payer: Self-pay | Admitting: Podiatry

## 2019-09-10 ENCOUNTER — Other Ambulatory Visit: Payer: Self-pay

## 2019-09-10 ENCOUNTER — Ambulatory Visit (INDEPENDENT_AMBULATORY_CARE_PROVIDER_SITE_OTHER): Payer: Self-pay | Admitting: Podiatry

## 2019-09-10 VITALS — Temp 96.1°F

## 2019-09-10 DIAGNOSIS — M778 Other enthesopathies, not elsewhere classified: Secondary | ICD-10-CM

## 2019-09-10 DIAGNOSIS — M79671 Pain in right foot: Secondary | ICD-10-CM

## 2019-09-10 DIAGNOSIS — G8929 Other chronic pain: Secondary | ICD-10-CM

## 2019-09-10 DIAGNOSIS — E1149 Type 2 diabetes mellitus with other diabetic neurological complication: Secondary | ICD-10-CM

## 2019-09-10 DIAGNOSIS — M79673 Pain in unspecified foot: Secondary | ICD-10-CM

## 2019-09-10 MED ORDER — PREGABALIN 75 MG PO CAPS: 75.0000 mg | ORAL_CAPSULE | Freq: Two times a day (BID) | ORAL | 0 refills | Status: DC

## 2019-09-10 MED FILL — PREGABALIN 75 MG CAPS: 75 | 30 days supply | Qty: 60 | Fill #0

## 2019-09-10 NOTE — Patient Instructions (Signed)
Pregabalin capsules What is this medicine? PREGABALIN (pre GAB a lin) is used to treat nerve pain from diabetes, shingles, spinal cord injury, and fibromyalgia. It is also used to control seizures in epilepsy. This medicine may be used for other purposes; ask your health care provider or pharmacist if you have questions. COMMON BRAND NAME(S): Lyrica What should I tell my health care provider before I take this medicine? They need to know if you have any of these conditions:  heart disease  history of drug abuse or alcohol abuse problem  kidney disease  lung or breathing disease  suicidal thoughts, plans, or attempt; a previous suicide attempt by you or a family member  an unusual or allergic reaction to pregabalin, gabapentin, other medicines, foods, dyes, or preservatives  pregnant or trying to get pregnant  breast-feeding How should I use this medicine? Take this medicine by mouth with a glass of water. Follow the directions on the prescription label. You can take it with or without food. If it upsets your stomach, take it with food. Take your medicine at regular intervals. Do not take it more often than directed. Do not stop taking except on your doctor's advice. A special MedGuide will be given to you by the pharmacist with each prescription and refill. Be sure to read this information carefully each time. Talk to your pediatrician regarding the use of this medicine in children. While this drug may be prescribed for children as young as 1 month for selected conditions, precautions do apply. Overdosage: If you think you have taken too much of this medicine contact a poison control center or emergency room at once. NOTE: This medicine is only for you. Do not share this medicine with others. What if I miss a dose? If you miss a dose, take it as soon as you can. If it is almost time for your next dose, take only that dose. Do not take double or extra doses. What may interact with this  medicine? This medicine may interact with the following medications:  alcohol  antihistamines for allergy, cough, and cold  certain medicines for anxiety or sleep  certain medicines for depression like amitriptyline, fluoxetine, sertraline  certain medicines for diabetes  certain medicines for seizures like phenobarbital, primidone  general anesthetics like halothane, isoflurane, methoxyflurane, propofol  local anesthetics like lidocaine, pramoxine, tetracaine  medicines that relax muscles for surgery  narcotic medicines for pain  phenothiazines like chlorpromazine, mesoridazine, prochlorperazine, thioridazine This list may not describe all possible interactions. Give your health care provider a list of all the medicines, herbs, non-prescription drugs, or dietary supplements you use. Also tell them if you smoke, drink alcohol, or use illegal drugs. Some items may interact with your medicine. What should I watch for while using this medicine? Tell your doctor or healthcare professional if your symptoms do not start to get better or if they get worse. Visit your doctor or health care professional for regular checks on your progress. Do not stop taking except on your doctor's advice. You may develop a severe reaction. Your doctor will tell you how much medicine to take. Wear a medical identification bracelet or chain if you are taking this medicine for seizures, and carry a card that describes your disease and details of your medicine and dosage times. You may get drowsy or dizzy. Do not drive, use machinery, or do anything that needs mental alertness until you know how this medicine affects you. Do not stand or sit up quickly, especially if   you are an older patient. This reduces the risk of dizzy or fainting spells. Alcohol may interfere with the effect of this medicine. Avoid alcoholic drinks. If you have a heart condition, like congestive heart failure, and notice that you are retaining  water and have swelling in your hands or feet, contact your health care provider immediately. The use of this medicine may increase the chance of suicidal thoughts or actions. Pay special attention to how you are responding while on this medicine. Any worsening of mood, or thoughts of suicide or dying should be reported to your health care professional right away. This medicine has caused reduced sperm counts in some men. This may interfere with the ability to father a child. You should talk to your doctor or health care professional if you are concerned about your fertility. Women who become pregnant while using this medicine for seizures may enroll in the [place] American Antiepileptic Drug Pregnancy Registry by calling 1-888-233-2334. This registry collects information about the safety of antiepileptic drug use during pregnancy. What side effects may I notice from receiving this medicine? Side effects that you should report to your doctor or health care professional as soon as possible:  allergic reactions like skin rash, itching or hives, swelling of the face, lips, or tongue  breathing problems  changes in vision  chest pain  confusion  jerking or unusual movements of any part of your body  loss of memory  muscle pain, tenderness, or weakness  suicidal thoughts or other mood changes  swelling of the ankles, feet, hands  unusual bruising or bleeding Side effects that usually do not require medical attention (report to your doctor or health care professional if they continue or are bothersome):  dizziness  drowsiness  dry mouth  headache  nausea  tremors  trouble sleeping  weight gain This list may not describe all possible side effects. Call your doctor for medical advice about side effects. You may report side effects to FDA at 1-800-FDA-1088. Where should I keep my medicine? Keep out of the reach of children. This medicine can be abused. Keep your medicine in a safe  place to protect it from theft. Do not share this medicine with anyone. Selling or giving away this medicine is dangerous and against the law. This medicine may cause accidental overdose and death if it taken by other adults, children, or pets. Mix any unused medicine with a substance like cat litter or coffee grounds. Then throw the medicine away in a sealed container like a sealed bag or a coffee can with a lid. Do not use the medicine after the expiration date. Store at room temperature between 15 and 30 degrees C (59 and 86 degrees F). NOTE: This sheet is a summary. It may not cover all possible information. If you have questions about this medicine, talk to your doctor, pharmacist, or health care provider.  2020 Elsevier/Gold Standard (2018-05-18 13:15:55)  

## 2019-09-12 ENCOUNTER — Other Ambulatory Visit: Payer: Self-pay | Admitting: Pharmacist

## 2019-09-12 DIAGNOSIS — E1165 Type 2 diabetes mellitus with hyperglycemia: Secondary | ICD-10-CM

## 2019-09-12 DIAGNOSIS — I10 Essential (primary) hypertension: Secondary | ICD-10-CM

## 2019-09-12 MED ORDER — LISINOPRIL 2.5 MG PO TABS: 2.5000 mg | ORAL_TABLET | Freq: Two times a day (BID) | ORAL | 1 refills | Status: DC

## 2019-09-12 MED FILL — LISINOPRIL 2.5 MG TABLET: 2.5 | 30 days supply | Qty: 60 | Fill #0

## 2019-09-13 NOTE — Progress Notes (Signed)
Subjective:   Patient ID: Ann Foster, female   DOB: 54 y.o.   MRN: [id number]   HPI 54 year old female presents the office today for concerns of tingling and throbbing to her toes leg up the leg which is been ongoing for last to 3 years and gradually getting worse.  She states that the pain is worse at nighttime she has difficulty sleeping at night because of the tingling.  She has a remote history over 5 years ago to the foot but no recent injury.  She was on gabapentin but she has stopped taking this on her own.  She did not find this medicine helpful.  She is diabetic and last A1c was 7.1.  No ulcerations.   Review of Systems  All other systems reviewed and are negative.  Past Medical History:  Diagnosis Date  . Ankle fracture 2016   Right  . Aortic atherosclerosis (HCC)    trace calcific atherosclerosis aortic arch per ct neck done 12-22-17  . Bronchitis   . Diabetes mellitus without complication ([place])   . Hypertension   . OA (osteoarthritis) of shoulder    left shoulder, both knees arthritis    Past Surgical History:  Procedure Laterality Date  . NO PAST SURGERIES       Current Outpatient Medications:  .  atorvastatin (LIPITOR) 20 MG tablet, Take 1 tablet (20 mg total) by mouth daily., Disp: 90 tablet, Rfl: 0 .  Blood Glucose Monitoring Suppl (TRUE METRIX METER) w/Device KIT, 1 each by Does not apply route 2 (two) times daily., Disp: 1 kit, Rfl: 0 .  gabapentin (NEURONTIN) 300 MG capsule, Take 1 capsule (300 mg total) by mouth at bedtime., Disp: 30 capsule, Rfl: 2 .  glucose blood (TRUE METRIX BLOOD GLUCOSE TEST) test strip, Use as instructed. Check blood glucose level by fingerstick twice per day. E11.65, Disp: 200 each, Rfl: 6 .  ibuprofen (ADVIL,MOTRIN) 200 MG tablet, Take 400-600 mg by mouth every 6 (six) hours as needed for headache (pain)., Disp: , Rfl:  .  metFORMIN (GLUCOPHAGE) 500 MG tablet, Take 2 tablets (1,000 mg total) by mouth 2 (two) times daily with a  meal., Disp: 360 tablet, Rfl: 0 .  TRUEplus Lancets 28G MISC, 1 each by Does not apply route 2 (two) times daily. E11.65, Disp: 200 each, Rfl: 6 .  lisinopril (ZESTRIL) 2.5 MG tablet, Take 1 tablet (2.5 mg total) by mouth 2 (two) times daily., Disp: 60 tablet, Rfl: 1 .  pregabalin (LYRICA) 75 MG capsule, Take 1 capsule (75 mg total) by mouth 2 (two) times daily., Disp: 60 capsule, Rfl: 0  No Known Allergies       Objective:  Physical Exam  General: AAO x3, NAD  Dermatological: Skin is warm, dry and supple bilateral.  There are no open sores, no preulcerative lesions, no rash or signs of infection present.  Vascular: Dorsalis Pedis artery and Posterior Tibial artery pedal pulses are 2/4 bilateral with immedate capillary fill time. There is no pain with calf compression, swelling, warmth, erythema.   Neruologic: Sensation decreased with Semmes Weinstein monofilament.  Musculoskeletal: Hammertoes present.  Muscular strength 5/5 in all groups tested bilateral.  Gait: Unassisted, Nonantalgic.       Assessment:   54 year old female with type 2 diabetes with neuropathy.     Plan:  -Treatment options discussed including all alternatives, risks, and complications -X-rays obtained and reviewed on the right foot.  No evidence of acute fracture. -Etiology of symptoms were discussed -She previously  tried gabapentin with improvement.  Recommended switch to Lyrica.  Discussed side effects of this medication.  We will start 75 mg twice a day. -Discussed daily foot inspection.  Return in about 6 weeks (around 10/22/2019).  Trula Slade DPM

## 2019-09-17 ENCOUNTER — Ambulatory Visit: Payer: Self-pay | Attending: Nurse Practitioner | Admitting: Nurse Practitioner

## 2019-09-17 ENCOUNTER — Encounter: Payer: Self-pay | Admitting: Nurse Practitioner

## 2019-09-17 ENCOUNTER — Other Ambulatory Visit: Payer: Self-pay

## 2019-09-17 DIAGNOSIS — E1142 Type 2 diabetes mellitus with diabetic polyneuropathy: Secondary | ICD-10-CM

## 2019-09-17 DIAGNOSIS — N941 Unspecified dyspareunia: Secondary | ICD-10-CM

## 2019-09-17 NOTE — Progress Notes (Signed)
Virtual Visit via Telephone Note Due to national recommendations of social distancing due to [place] 19, telehealth visit is felt to be most appropriate for this patient at this time.  I discussed the limitations, risks, security and privacy concerns of performing an evaluation and management service by telephone and the availability of in person appointments. I also discussed with the patient that there may be a patient responsible charge related to this service. The patient expressed understanding and agreed to proceed.    I connected with Ann Foster on 09/17/19  at  10:50 AM EDT  EDT by telephone and verified that I am speaking with the correct person using two identifiers.   Consent I discussed the limitations, risks, security and privacy concerns of performing an evaluation and management service by telephone and the availability of in person appointments. I also discussed with the patient that there may be a patient responsible charge related to this service. The patient expressed understanding and agreed to proceed.   Location of Patient: Private Residence    Location of Provider: [place] participating in Telemedicine visit: [name] FNP-BC [name]    History of Present Illness: Telemedicine visit for: Follow Up Neuropathy  She was prescribed gabapentin 300 mg nightly by me last month for peripheral neuropathy. Since then she has seen a podiatrist and was switched to Lyrica 75 mg BID. She tells me she stopped taking gabapentin because she did not like the way it made her feel. Doing well with the Lyrica and neuropathy has significantly improved.    Dyspareunia She has tried OTC vaginal lubricants as well as olive oil.  Mother has history of breast cancer. As I do not prescribe hormonal agents I will refer her to the women's clinic for guidance.      Past Medical History:  Diagnosis Date  . Ankle  fracture 2016   Right  . Aortic atherosclerosis (HCC)    trace calcific atherosclerosis aortic arch per ct neck done 12-22-17  . Bronchitis   . Diabetes mellitus without complication ([place])   . Hypertension   . OA (osteoarthritis) of shoulder    left shoulder, both knees arthritis    Past Surgical History:  Procedure Laterality Date  . NO PAST SURGERIES      Family History  Problem Relation Age of Onset  . Cancer Mother   . Breast cancer Mother   . Cancer Father   . Colon cancer Father   . Diabetes Son     Social History   Socioeconomic History  . Marital status: Single    Spouse name: Not on file  . Number of children: 3  . Years of education: Not on file  . Highest education level: High school graduate  Occupational History  . Not on file  Tobacco Use  . Smoking status: Current Some Day Smoker    Packs/day: 0.30    Types: Cigarettes  . Smokeless tobacco: Never Used  . Tobacco comment: stopped 2 months ago, smokes 1-2 cigarettes day now  Substance and Sexual Activity  . Alcohol use: Yes    Comment: Occasional  . Drug use: No  . Sexual activity: Yes    Birth control/protection: Condom  Other Topics Concern  . Not on file  Social History Narrative  . Not on file   Social Determinants of Health   Financial Resource Strain:   . Difficulty of Paying Living Expenses:  Food Insecurity:   . Worried About Charity fundraiser in the Last Year:   . Arboriculturist in the Last Year:   Transportation Needs: No Transportation Needs  . Lack of Transportation (Medical): No  . Lack of Transportation (Non-Medical): No  Physical Activity:   . Days of Exercise per Week:   . Minutes of Exercise per Session:   Stress:   . Feeling of Stress :   Social Connections:   . Frequency of Communication with Friends and Family:   . Frequency of Social Gatherings with Friends and Family:   . Attends Religious Services:   . Active Member of Clubs or Organizations:   . Attends English as a second language teacher Meetings:   Marland Kitchen Marital Status:      Observations/Objective: Awake, alert and oriented x 3   Review of Systems  Constitutional: Negative for fever, malaise/fatigue and weight loss.  HENT: Negative.  Negative for nosebleeds.   Eyes: Negative.  Negative for blurred vision, double vision and photophobia.  Respiratory: Negative.  Negative for cough and shortness of breath.   Cardiovascular: Negative.  Negative for chest pain, palpitations and leg swelling.  Gastrointestinal: Negative.  Negative for heartburn, nausea and vomiting.  Genitourinary:       SEE HPI  Musculoskeletal: Negative.  Negative for myalgias.  Neurological: Positive for sensory change. Negative for dizziness, focal weakness, seizures and headaches.  Psychiatric/Behavioral: Negative.  Negative for suicidal ideas.    Assessment and Plan: Ann Foster was seen today for advice only.  Diagnoses and all orders for this visit:  Dyspareunia, female -     Ambulatory referral to Gynecology  Diabetic polyneuropathy associated with type 2 diabetes mellitus ([place]) Continue lyrica as prescribed.    Follow Up Instructions Return in about 2 months (around 11/17/2019).     I discussed the assessment and treatment plan with the patient. The patient was provided an opportunity to ask questions and all were answered. The patient agreed with the plan and demonstrated an understanding of the instructions.   The patient was advised to call back or seek an in-person evaluation if the symptoms worsen or if the condition fails to improve as anticipated.  I provided 15 minutes of non-face-to-face time during this encounter including median intraservice time, reviewing previous notes, labs, imaging, medications and explaining diagnosis and management.  Gildardo Pounds, FNP-BC

## 2019-09-24 MED FILL — ?ATORVASTATIN 20 MG TABLET: 20 | 30 days supply | Qty: 30 | Fill #2

## 2019-10-14 MED FILL — LISINOPRIL 2.5 MG TABLET: 2.5 | 30 days supply | Qty: 60 | Fill #1

## 2019-10-14 MED FILL — METFORMIN HCL 500 MG TABS: 500 | 30 days supply | Qty: 120 | Fill #2

## 2019-10-22 ENCOUNTER — Ambulatory Visit (INDEPENDENT_AMBULATORY_CARE_PROVIDER_SITE_OTHER): Payer: Self-pay | Admitting: Podiatry

## 2019-10-22 ENCOUNTER — Other Ambulatory Visit: Payer: Self-pay

## 2019-10-22 ENCOUNTER — Encounter: Payer: Self-pay | Admitting: Podiatry

## 2019-10-22 ENCOUNTER — Ambulatory Visit: Payer: Self-pay | Admitting: Podiatry

## 2019-10-22 DIAGNOSIS — M79673 Pain in unspecified foot: Secondary | ICD-10-CM

## 2019-10-22 DIAGNOSIS — E1149 Type 2 diabetes mellitus with other diabetic neurological complication: Secondary | ICD-10-CM

## 2019-10-22 DIAGNOSIS — M722 Plantar fascial fibromatosis: Secondary | ICD-10-CM

## 2019-10-22 DIAGNOSIS — G8929 Other chronic pain: Secondary | ICD-10-CM

## 2019-10-22 NOTE — Progress Notes (Signed)
Subjective: 54 year old female presents the office today for follow-up evaluation of neuropathy.  She was started on Lyrica 75 mg twice a day which she states helps some the pain is not as bad however she is experiencing sharp pain radiating up the right leg to the hip.  This is intermittent.  She gets some tingling and pins-and-needles sensation to her left foot but not like the right side.  She also states when she first gets up she gets discomfort to her foot as she walks it feels better.  She denies any recent injury or falls and no weakness.  No ulcerations or any changes since I last saw her. Denies any systemic complaints such as fevers, chills, nausea, vomiting. No acute changes since last appointment, and no other complaints at this time.   Objective: AAO x3, NAD DP/PT pulses palpable bilaterally, CRT less than 3 seconds  Mild tenderness on the plantar aspect of the feet bilaterally most on the course of the plantar fascia.  There is no area of pinpoint tenderness.  Lexer, extensor tendons appear to be intact. No open lesions or pre-ulcerative lesions.  No pain with calf compression, swelling, warmth, erythema  Assessment: Neuropathy; plantar fasciitis  Plan: -All treatment options discussed with the patient including all alternatives, risks, complications.  -Lyrica is been helpful at 75 mg twice a day but she is describing sharp pain of her foot all the way to her hip and back.  Due to this, have her follow-up with neurology and neurology consult be placed today.  Discussed changing dose of Lyrica but she seems to be doing somewhat better with this will continue for now.  Also recommend her follow-up with her primary care physician. -In regards to the foot pain discussed traction, icing daily as well as wearing supportive shoes, orthotics -Patient encouraged to call the office with any questions, concerns, change in symptoms.   Trula Slade DPM

## 2019-10-24 ENCOUNTER — Other Ambulatory Visit (HOSPITAL_COMMUNITY)
Admission: RE | Admit: 2019-10-24 | Payer: No Typology Code available for payment source | Source: Ambulatory Visit | Admitting: Obstetrics & Gynecology

## 2019-10-24 ENCOUNTER — Encounter: Payer: Self-pay | Admitting: Neurology

## 2019-10-24 ENCOUNTER — Other Ambulatory Visit: Payer: Self-pay

## 2019-10-24 ENCOUNTER — Ambulatory Visit (INDEPENDENT_AMBULATORY_CARE_PROVIDER_SITE_OTHER): Payer: Self-pay | Admitting: Obstetrics & Gynecology

## 2019-10-24 ENCOUNTER — Encounter: Payer: Self-pay | Admitting: Obstetrics & Gynecology

## 2019-10-24 ENCOUNTER — Telehealth: Payer: Self-pay | Admitting: *Deleted

## 2019-10-24 VITALS — BP 114/75 | HR 80 | Ht 63.0 in | Wt 188.9 lb

## 2019-10-24 DIAGNOSIS — R102 Pelvic and perineal pain: Secondary | ICD-10-CM

## 2019-10-24 DIAGNOSIS — G629 Polyneuropathy, unspecified: Secondary | ICD-10-CM

## 2019-10-24 DIAGNOSIS — N952 Postmenopausal atrophic vaginitis: Secondary | ICD-10-CM

## 2019-10-24 DIAGNOSIS — M79673 Pain in unspecified foot: Secondary | ICD-10-CM

## 2019-10-24 DIAGNOSIS — N941 Unspecified dyspareunia: Secondary | ICD-10-CM

## 2019-10-24 MED ORDER — INTRAROSA 6.5 MG VA INST: VAGINAL_INSERT | VAGINAL | 3 refills | Status: DC

## 2019-10-24 NOTE — Patient Instructions (Signed)
Dyspareunia, Female Dyspareunia is pain that is associated with sexual activity. This can affect any part of the genitals or lower abdomen. There are many possible causes of this condition. In some cases, diagnosing the cause of dyspareunia can be difficult. This condition can be mild, moderate, or severe. Depending on the cause, dyspareunia may get better with treatment, but may return (recur) over time. What are the causes?  The cause of this condition is not always known. However, problems that affect the vulva, vagina, uterus, and other organs may cause dyspareunia. Common causes of this condition include:  Vaginal dryness.  Giving birth.  Infection.  Skin changes or conditions.  Side effects of medicines.  Endometriosis. This is when tissue that is like the lining of the uterus grows on the outside of the uterus.  Psychological conditions. These include depression, anxiety, or traumatic experiences.  Allergic reaction. What increases the risk? The following factors may make you more likely to develop this condition:  History of physical or sexual trauma.  Some medicines.  No longer having a monthly period (menopause).  Having recently given birth.  Taking baths using soaps that have perfumes. These can cause irritation.  Douching. What are the signs or symptoms? The main symptom of this condition is pain in any part of your genitals or lower abdomen during or after sex. This may include:  Irritation, burning, or stinging sensations in your vulva.  Discomfort when your vulva or surrounding area is touched.  Aching and throbbing pain that may be constant.  Pain that gets worse when something is inserted into your vagina. How is this diagnosed? This condition may be diagnosed based on:  Your symptoms, including where and when your pain occurs.  Your medical history.  A physical exam. A pelvic exam will most likely be done.  Tests that include ultrasound,  blood tests, and tests that check the body for infection.  Imaging tests, such as X-ray, MRI, and CT scan. You may be referred to a health care provider who specializes in women's health (gynecologist). How is this treated? Treatment depends on the cause of your condition and your symptoms. In most cases, you may need to stop sexual activity until your symptoms go away or get better. Treatment may include:  Lubricants, ointments, and creams.  Physical therapy.  Massage therapy.  Hormonal therapy.  Medicines to: ? Prevent or fight infection. ? Relieve pain. ? Help numb the area. ? Treat depression (antidepressants).  Counseling, which may include sex therapy.  Surgery. Follow these instructions at home: Lifestyle  Wear cotton underwear.  Use water-based lubricants as needed during sex. Avoid oil-based lubricants.  Do not use any products that can cause irritation. This may include certain condoms, spermicides, lubricants, soaps, tampons, vaginal sprays, or douches.  Always practice safe sex. Use a condom to prevent sexually transmitted infections (STIs).  Talk freely with your partner about your condition. General instructions  Take or apply over-the-counter and prescription medicines only as told by your health care provider.  Urinate before you have sex.  Consider joining a support group.  Get the results of any tests you have done. Ask your health care provider, or the department that is doing the procedure, when your results will be ready.  Keep all follow-up visits as told by your health care provider. This is important. Contact a health care provider if:  You have vaginal bleeding after having sex.  You develop a lump at the opening of your vagina even if the   lump is painless.  You have: ? Abnormal discharge from your vagina. ? Vaginal dryness. ? Itchiness or irritation of your vulva or vagina. ? A new rash. ? Symptoms that get worse or do not improve  with treatment. ? A fever. ? Pain when you urinate. ? Blood in your urine. Get help right away if:  You have severe pain in your abdomen during or shortly after sex.  You pass out after sex. Summary  Dyspareunia is pain that is associated with sexual activity. This can affect any part of the genitals or lower abdomen.  There are many causes of this condition. Treatment depends on the cause and your symptoms. In most cases, you may need to stop sexual activity until your symptoms improve.  Take or apply over-the-counter and prescription medicines only as told by your health care provider.  Contact a health care provider if your symptoms get worse or do not improve with treatment.  Keep all follow-up visits as told by your health care provider. This is important. This information is not intended to replace advice given to you by your health care provider. Make sure you discuss any questions you have with your health care provider. Document Revised: 07/23/2018 Document Reviewed: 07/23/2018 Elsevier Patient Education  2020 Elsevier Inc.  

## 2019-10-24 NOTE — Telephone Encounter (Signed)
Faxed referral, clinicals and demographics to [facility].

## 2019-10-24 NOTE — Telephone Encounter (Signed)
-----   Message from Trula Slade, DPM sent at 10/22/2019  7:27 PM EDT ----- Can you please order a neurology consult? Thanks.

## 2019-10-24 NOTE — Progress Notes (Signed)
C/o painful intercourse; vaginal dryness.  Meiko Stranahan,RN

## 2019-10-24 NOTE — Progress Notes (Signed)
   Subjective:    Patient ID: Ann Foster, female    DOB: May 24, 1966, 54 y.o.   MRN: [id number]  HPI  54 yo female presents for painful intercourse and vaginal dryness.  It has been bothering her for several years.  Pt denies deep pelvic pain, burning with urination, difficulties with bowel movements.  She has not tired any OTC or prescriptions for the burning.    Review of Systems  Constitutional: Negative.   Respiratory: Negative.   Cardiovascular: Negative.   Genitourinary: Positive for dyspareunia and vaginal pain. Negative for menstrual problem, pelvic pain and vaginal discharge.       Objective:   Physical Exam Vitals reviewed.  Constitutional:      General: She is not in acute distress.    Appearance: She is well-developed.  HENT:     Head: Normocephalic and atraumatic.  Eyes:     Conjunctiva/sclera: Conjunctivae normal.  Cardiovascular:     Rate and Rhythm: Normal rate.  Pulmonary:     Effort: Pulmonary effort is normal.  Abdominal:     General: Abdomen is flat.     Palpations: Abdomen is soft.     Tenderness: There is no abdominal tenderness.  Genitourinary:    Comments: Tanner V Vulva:  No lesion, tender over vestbiular glands Vagina:  Paleink, no lesions, small amount of discharge, no blood, mild tenderness in lower vaginal vault, increased muscle tension. Cervix:  No CMT Uterus:  Non tender, limited by habitus Right adnexa--non tender, limited by habitus Left adnexa--non tender, limited by habitus  Skin:    General: Skin is warm and dry.  Neurological:     Mental Status: She is alert and oriented to person, place, and time.       Assessment & Plan:  54 yo female with dyspareunia and vaginal dryness  1.  Check for vaginitis (returned BV-->Rx with Flagyl) 2.  Intrarosa (with prescription drug card) for vaginal atrophy 3.  Referral to in house PT for vaginal pain and dyspareunia. 4.  RTC in 8 weeks to re eval.

## 2019-10-25 LAB — CERVICOVAGINAL ANCILLARY ONLY
Bacterial Vaginitis (gardnerella): POSITIVE — AB
Candida Glabrata: NEGATIVE
Candida Vaginitis: NEGATIVE
Chlamydia: NEGATIVE
Comment: NEGATIVE
Comment: NEGATIVE
Comment: NEGATIVE
Comment: NEGATIVE
Comment: NEGATIVE
Comment: NORMAL
Neisseria Gonorrhea: NEGATIVE
Trichomonas: NEGATIVE

## 2019-10-29 ENCOUNTER — Ambulatory Visit: Payer: Self-pay | Attending: Nurse Practitioner | Admitting: Nurse Practitioner

## 2019-10-29 ENCOUNTER — Other Ambulatory Visit: Payer: Self-pay

## 2019-10-29 ENCOUNTER — Other Ambulatory Visit: Payer: Self-pay | Admitting: Nurse Practitioner

## 2019-10-29 ENCOUNTER — Encounter: Payer: Self-pay | Admitting: Nurse Practitioner

## 2019-10-29 VITALS — BP 107/69 | HR 79 | Temp 97.7°F | Ht 63.0 in | Wt 192.0 lb

## 2019-10-29 DIAGNOSIS — E785 Hyperlipidemia, unspecified: Secondary | ICD-10-CM

## 2019-10-29 DIAGNOSIS — E1165 Type 2 diabetes mellitus with hyperglycemia: Secondary | ICD-10-CM

## 2019-10-29 DIAGNOSIS — N760 Acute vaginitis: Secondary | ICD-10-CM

## 2019-10-29 DIAGNOSIS — B9689 Other specified bacterial agents as the cause of diseases classified elsewhere: Secondary | ICD-10-CM

## 2019-10-29 LAB — POCT GLYCOSYLATED HEMOGLOBIN (HGB A1C): Hemoglobin A1C: 6.8 % — AB (ref 4.0–5.6)

## 2019-10-29 LAB — GLUCOSE, POCT (MANUAL RESULT ENTRY): POC Glucose: 124 mg/dl — AB (ref 70–99)

## 2019-10-29 MED ORDER — METFORMIN HCL 500 MG PO TABS: 1000.0000 mg | ORAL_TABLET | Freq: Two times a day (BID) | ORAL | 0 refills | Status: DC

## 2019-10-29 MED ORDER — METRONIDAZOLE 500 MG PO TABS: 500.0000 mg | ORAL_TABLET | Freq: Two times a day (BID) | ORAL | 0 refills | Status: AC

## 2019-10-29 MED ORDER — RAMIPRIL 2.5 MG PO CAPS: 2.5000 mg | ORAL_CAPSULE | Freq: Every day | ORAL | 0 refills | Status: DC

## 2019-10-29 MED ORDER — ATORVASTATIN CALCIUM 20 MG PO TABS: 20.0000 mg | ORAL_TABLET | Freq: Every day | ORAL | 0 refills | Status: DC

## 2019-10-29 MED ORDER — TRUE METRIX BLOOD GLUCOSE TEST VI STRP: ORAL_STRIP | 6 refills | Status: DC

## 2019-10-29 MED FILL — ATORVASTATIN CALCIUM 20 MG: 20 | 30 days supply | Qty: 30 | Fill #0

## 2019-10-29 MED FILL — RAMIPRIL 2.5 MG CAPSULE: 2.5 | 30 days supply | Qty: 30 | Fill #0

## 2019-10-29 MED FILL — metroNIDAZOLE 500 MG TABS: 500 | 7 days supply | Qty: 14 | Fill #0

## 2019-10-29 MED FILL — CONTOUR NEXT STRIPS: 50 days supply | Qty: 100 | Fill #0

## 2019-10-29 NOTE — Progress Notes (Signed)
Assessment & Plan:  Ann Foster was seen today for follow-up.  Diagnoses and all orders for this visit:  Type 2 diabetes mellitus with hyperglycemia, unspecified whether long term insulin use (HCC) -     Glucose (CBG) -     HgB A1c -     metFORMIN (GLUCOPHAGE) 500 MG tablet; Take 2 tablets (1,000 mg total) by mouth 2 (two) times daily with a meal. -     glucose blood (TRUE METRIX BLOOD GLUCOSE TEST) test strip; Use as instructed. Check blood glucose level by fingerstick twice per day. E11.65 -     ramipril (ALTACE) 2.5 MG capsule; Take 1 capsule (2.5 mg total) by mouth daily. -     Basic Metabolic Panel Continue blood sugar control as discussed in office today, low carbohydrate diet, and regular physical exercise as tolerated, 150 minutes per week (30 min each day, 5 days per week, or 50 min 3 days per week). Keep blood sugar logs with fasting goal of 90-130 mg/dl, post prandial (after you eat) less than 180.  For Hypoglycemia: BS <60 and Hyperglycemia BS >400; contact the clinic ASAP. Annual eye exams and foot exams are recommended.   Dyslipidemia, goal LDL below 70 -     atorvastatin (LIPITOR) 20 MG tablet; Take 1 tablet (20 mg total) by mouth daily. INSTRUCTIONS: Work on a low fat, heart healthy diet and participate in regular aerobic exercise program by working out at least 150 minutes per week; 5 days a week-30 minutes per day. Avoid red meat/beef/steak,  fried foods. junk foods, sodas, sugary drinks, unhealthy snacking, alcohol and smoking.  Drink at least 80 oz of water per day and monitor your carbohydrate intake daily.   Bacterial vaginosis -     metroNIDAZOLE (FLAGYL) 500 MG tablet; Take 1 tablet (500 mg total) by mouth 2 (two) times daily for 7 days. Recently diagnosed with BV. Requesting treatment at this time.    Patient has been counseled on age-appropriate routine health concerns for screening and prevention. These are reviewed and up-to-date. Referrals have been placed  accordingly. Immunizations are up-to-date or declined.    Subjective:   Chief Complaint  Patient presents with   Follow-up    Pt. is here for 3 months f.u on diabetes.    HPI Ann Foster 54 y.o. female presents to office today for follow up.  She has been referred to Neurology by her podiatrist for further evaluation of neuropathy. She endorses pain radiating from her right foot into the lower leg and right lumbosacral area. Worse after walking or standing from a seated position. Currently taking lyrica which seems to provide some relief of her symptoms. She is not working right now as she is unable to stand long periods of time. She worked in dietary full time previously. Does not have to use a cane or walker.    DM TYPE 2 Well controlled. Taking metformin 1000 mg BID. Denies any hypo or hyperglycemic symptoms. Taking renal dose ACE and STATIN (atorvastatin 20 mg daily). LDL not at goal.  Lab Results  Component Value Date   HGBA1C 6.8 (A) 10/29/2019   Lab Results  Component Value Date   HGBA1C 7.1 (H) 07/29/2019   Lab Results  Component Value Date   [place] 95 08/21/2019     ROS  Past Medical History:  Diagnosis Date   Ankle fracture 2016   Right   Aortic atherosclerosis (HCC)    trace calcific atherosclerosis aortic arch per ct neck done  12-22-17   Bronchitis    Diabetes mellitus without complication (HCC)    Hypertension    OA (osteoarthritis) of shoulder    left shoulder, both knees arthritis    Past Surgical History:  Procedure Laterality Date   NO PAST SURGERIES      Family History  Problem Relation Age of Onset   Cancer Mother    Breast cancer Mother    Cancer Father    Colon cancer Father    Diabetes Son     Social History Reviewed with no changes to be made today.   Outpatient Medications Prior to Visit  Medication Sig Dispense Refill   Blood Glucose Monitoring Suppl (TRUE METRIX METER) w/Device KIT 1 each by Does not apply  route 2 (two) times daily. 1 kit 0   ibuprofen (ADVIL,MOTRIN) 200 MG tablet Take 400-600 mg by mouth every 6 (six) hours as needed for headache (pain).     Prasterone (INTRAROSA) 6.5 MG INST Insert one capsule vaginally 28 each 3   pregabalin (LYRICA) 75 MG capsule Take 1 capsule (75 mg total) by mouth 2 (two) times daily. 60 capsule 0   TRUEplus Lancets 28G MISC 1 each by Does not apply route 2 (two) times daily. E11.65 200 each 6   glucose blood (TRUE METRIX BLOOD GLUCOSE TEST) test strip Use as instructed. Check blood glucose level by fingerstick twice per day. E11.65 200 each 6   metFORMIN (GLUCOPHAGE) 1000 MG tablet Take 1,000 mg by mouth 2 (two) times daily with a meal. Take 2 tablets twice a day with meals     atorvastatin (LIPITOR) 20 MG tablet Take 1 tablet (20 mg total) by mouth daily. 90 tablet 0   lisinopril (ZESTRIL) 2.5 MG tablet Take 1 tablet (2.5 mg total) by mouth 2 (two) times daily. 60 tablet 1   metFORMIN (GLUCOPHAGE) 500 MG tablet Take 2 tablets (1,000 mg total) by mouth 2 (two) times daily with a meal. 360 tablet 0   No facility-administered medications prior to visit.    No Known Allergies     Objective:    BP 107/69 (BP Location: Left Arm, Patient Position: Sitting, Cuff Size: Large)    Pulse 79    Temp 97.7 F (36.5 C) (Temporal)    Ht '5\' 3"'$  (1.6 m)    Wt 192 lb (87.1 kg)    SpO2 100%    BMI 34.01 kg/m  Wt Readings from Last 3 Encounters:  10/29/19 192 lb (87.1 kg)  10/24/19 188 lb 14.4 oz (85.7 kg)  08/27/19 190 lb (86.2 kg)    Physical Exam       Patient has been counseled extensively about nutrition and exercise as well as the importance of adherence with medications and regular follow-up. The patient was given clear instructions to go to ER or return to medical center if symptoms don't improve, worsen or new problems develop. The patient verbalized understanding.   Follow-up: Return in about 3 months (around 01/29/2020).   Gildardo Pounds,  FNP-BC [facility] [place], [place]   10/29/2019, 2:54 PM

## 2019-10-30 LAB — BASIC METABOLIC PANEL
BUN/Creatinine Ratio: 17 (ref 9–23)
BUN: 12 mg/dL (ref 6–24)
CO2: 24 mmol/L (ref 20–29)
Calcium: 10.4 mg/dL — ABNORMAL HIGH (ref 8.7–10.2)
Chloride: 105 mmol/L (ref 96–106)
Creatinine, Ser: 0.72 mg/dL (ref 0.57–1.00)
GFR calc Af Amer: 109 mL/min/{1.73_m2} (ref >59)
GFR calc non Af Amer: 95 mL/min/{1.73_m2} (ref >59)
Glucose: 105 mg/dL — ABNORMAL HIGH (ref 65–99)
Potassium: 4.3 mmol/L (ref 3.5–5.2)
Sodium: 142 mmol/L (ref 134–144)

## 2019-11-05 ENCOUNTER — Other Ambulatory Visit: Payer: Self-pay | Admitting: Nurse Practitioner

## 2019-11-05 MED ORDER — CONTOUR NEXT TEST VI STRP: ORAL_STRIP | 12 refills | Status: DC

## 2019-11-05 MED ORDER — CONTOUR BLOOD GLUCOSE SYSTEM W/DEVICE KIT: PACK | 0 refills | Status: DC

## 2019-11-18 ENCOUNTER — Telehealth: Payer: Self-pay | Admitting: Nurse Practitioner

## 2019-11-18 NOTE — Telephone Encounter (Signed)
   Ann Foster DOB: 06-Aug-1965 MRN: [id number]   RIDER WAIVER AND RELEASE OF LIABILITY  For purposes of improving physical access to our facilities, [facility] is pleased to partner with third parties to provide [facility] patients or other authorized individuals the option of convenient, on-demand ground transportation services (the [place]") through use of the technology service that enables users to request on-demand ground transportation from independent third-party providers.  By opting to use and accept these [employer], I, the undersigned, hereby agree on behalf of myself, and on behalf of any minor child using the [employer] for whom I am the parent or legal guardian, as follows:  1. Government social research officer provided to me are provided by independent third-party transportation providers who are not [employer] or employees and who are unaffiliated with [employer]. 2. [facility] is neither a transportation carrier nor a common or public carrier. 3. [facility] has no control over the quality or safety of the transportation that occurs as a result of the [employer]. 4. [facility] cannot guarantee that any third-party transportation provider will complete any arranged transportation service. 5. [facility] [facility] makes no representation, warranty, or guarantee regarding the reliability, timeliness, quality, safety, suitability, or availability of any of the Transport Services or that they will be error free. 6. I fully understand that traveling by vehicle involves risks and dangers of serious bodily injury, including permanent disability, paralysis, and death. I agree, on behalf of myself and on behalf of any minor child using the Transport Services for whom I am the parent or legal guardian, that the entire risk arising out of my use of the [employer] remains solely with me, to the maximum extent permitted under applicable law. 7. The [employer] are provided "as is" and "as available." [facility] disclaims all representations and warranties, express, implied or statutory, not expressly set out in these terms, including the implied warranties of merchantability and fitness for a particular purpose. 8. I hereby waive and release [facility], its agents, employees, officers, directors, representatives, insurers, attorneys, assigns, successors, subsidiaries, and affiliates from any and all past, present, or future claims, demands, liabilities, actions, causes of action, or suits of any kind directly or indirectly arising from acceptance and use of the [employer]. 9. I further waive and release [facility] and its affiliates from all present and future liability and responsibility for any injury or death to persons or damages to property caused by or related to the use of the [employer]. 10. I have read this Waiver and Release of Liability, and I understand the terms used in it and their legal significance. This Waiver is freely and voluntarily given with the understanding that my right (as well as the right of any minor child for whom I am the parent or legal guardian using the [employer]) to legal recourse against [place] in connection with the [employer] is knowingly surrendered in return for use of these services.   I attest that I read the consent document to Ann Foster, gave Ann Foster the opportunity to ask questions and answered the questions asked (if any). I affirm that Ann Foster then provided consent for she's participation in this program.     Cameron Proud

## 2019-11-19 ENCOUNTER — Encounter: Payer: Self-pay | Admitting: Physical Therapy

## 2019-11-19 ENCOUNTER — Encounter: Payer: BLUE CROSS/BLUE SHIELD | Attending: Obstetrics & Gynecology | Admitting: Physical Therapy

## 2019-11-19 ENCOUNTER — Other Ambulatory Visit: Payer: Self-pay

## 2019-11-19 DIAGNOSIS — R252 Cramp and spasm: Secondary | ICD-10-CM | POA: Diagnosis present

## 2019-11-19 DIAGNOSIS — M6281 Muscle weakness (generalized): Secondary | ICD-10-CM | POA: Insufficient documentation

## 2019-11-19 DIAGNOSIS — N941 Unspecified dyspareunia: Secondary | ICD-10-CM | POA: Diagnosis present

## 2019-11-19 DIAGNOSIS — M25551 Pain in right hip: Secondary | ICD-10-CM

## 2019-11-19 DIAGNOSIS — M25651 Stiffness of right hip, not elsewhere classified: Secondary | ICD-10-CM | POA: Diagnosis present

## 2019-11-19 NOTE — Patient Instructions (Addendum)
Moisturizers . They are used in the vagina to hydrate the mucous membrane that make up the vaginal canal. . Designed to keep a more normal acid balance (ph) . Once placed in the vagina, it will last between two to three days.  . Use 2-3 times per week at bedtime  . Ingredients to avoid is glycerin and fragrance, can increase chance of infection . Should not be used just before sex due to causing irritation . Most are gels administered either in a tampon-shaped applicator or as a vaginal suppository. They are non-hormonal.   Types of Moisturizers for internal  . Vitamin E vaginal suppositories- Whole foods, Amazon . Moist Again . Coconut oil- can break down condoms . Julva- (Do no use if on Tamoxifen) amazon . Yes moisturizer- amazon . NeuEve Silk , NeuEve Silver for menopausal or over 65 (if have severe vaginal atrophy or cancer treatments use NeuEve Silk for  1 month than move to [employer])- [employer], MapleFlower.dk . Olive and Bee intimate cream- www.oliveandbee.com.au . Mae vaginal [place] . Aloe .    Creams to use externally on the Vulva area  Albertson's (good for for cancer patients that had radiation to the area)- [place] or [employer].FlyingBasics.com.br  V-magic cream - amazon  Julva-amazon  Vital "V [place] salve ( help moisturize and help with thinning vulvar area, does have [place] by Irwin Brakeman labial moisturizer ([place],   Coconut or olive oil  aloe   Things to avoid in the vaginal area . Do not use things to irritate the vulvar area . No lotions just specialized creams for the vulva area- Neogyn, V-magic, No soaps; can use Aveeno or Calendula cleanser if needed. Must be gentle . No deodorants . No douches . Good to sleep without underwear to let the vaginal area to air out . No scrubbing: spread the lips to let warm water rinse over labias and pat dry   Massaging  around the vulva area with circular motion for 2 minutes per day  Laying down breath into the lower rib cage then abdomen, then bulging the pelvic floor 5 times 2 times per day  Earlie Counts, PT [facility] [address], [place], [place] [address] W: [phone] [email or web address]

## 2019-11-19 NOTE — Therapy (Signed)
[place] at [facility] [address] [place], [place], [address] Phone: [phone]   Fax:  [phone]  Physical Therapy Evaluation  Patient Details  Name: Ann Foster MRN: [id number] Date of Birth: May 16, 1966 Referring Provider (PT): Dr. Silas Sacramento   Encounter Date: 11/19/2019   PT End of Session - 11/19/19 1053    Visit Number 1    Date for PT Re-Evaluation 02/11/20    Authorization Type BCBS    Authorization - Visit Number 1    Authorization - Number of Visits 30    PT Start Time 2751    PT Stop Time 1115    PT Time Calculation (min) 52 min    Activity Tolerance Patient tolerated treatment well    Behavior During Therapy [facility] for tasks assessed/performed           Past Medical History:  Diagnosis Date  . Ankle fracture 2016   Right  . Aortic atherosclerosis (HCC)    trace calcific atherosclerosis aortic arch per ct neck done 12-22-17  . Bronchitis   . Diabetes mellitus without complication ([place])   . Hypertension   . OA (osteoarthritis) of shoulder    left shoulder, both knees arthritis    Past Surgical History:  Procedure Laterality Date  . NO PAST SURGERIES      There were no vitals filed for this visit.    Subjective Assessment - 11/19/19 1026    Subjective Patient reports the vaginal pain for awhile. Pain with intercourse. Pain with initial penetration with dryness and rough Patient does not want intercourse due to pain.    Patient Stated Goals reduce pain    Currently in Pain? Yes    Pain Score 9     Pain Location Vagina    Pain Orientation Mid    Pain Type Acute pain    Pain Onset More than a month ago    Pain Frequency Intermittent    Aggravating Factors  during penile penetration, vaginal exam    Pain Relieving Factors no vaginal penetration    Multiple Pain Sites Yes    Pain Score 10    Pain Location Leg    Pain Orientation Right    Pain Descriptors / Indicators  Numbness;Penetrating    Pain Type Acute pain    Pain Onset More than a month ago    Pain Frequency Intermittent    Aggravating Factors  walking    Pain Relieving Factors medication, not overdue it              [facility] PT Assessment - 11/19/19 0001      Assessment   Medical Diagnosis R10.2 Vaginal pain    Referring Provider (PT) Dr. Silas [place]    Onset Date/Surgical Date --   past year   Prior Therapy None      Precautions   Precautions None      Restrictions   Weight Bearing Restrictions No      Balance Screen   Has the patient fallen in the past 6 months No    Has the patient had a decrease in activity level because of a fear of falling?  No    Is the patient reluctant to leave their home because of a fear of falling?  No      Home Ecologist residence      Prior Function   Level of Independence Independent    Leisure walk  Cognition   Overall Cognitive Status Within Functional Limits for tasks assessed      ROM / Strength   AROM / PROM / Strength AROM;PROM;Strength      AROM   Lumbar Extension decreased by 25%      PROM   Right Hip External Rotation  30    Left Hip External Rotation  25      Strength   Right Hip Flexion 4/5    Right Hip External Rotation  4/5    Right Hip Internal Rotation 4/5    Right Hip ABduction 4-/5    Right Hip ADduction 4-/5    Left Hip ABduction 4/5    Left Hip ADduction 4/5      Palpation   SI assessment  right ilium anteriorly rotated    Palpation comment right posterior hip, right levator ani      Special Tests    Special Tests Hip Special Tests    Hip Special Tests  Saralyn Pilar (FABER) Test      Saralyn Pilar [facility]) Test   Findings Positive    Side Right    Comments pain                      Objective measurements completed on examination: See above findings.     Pelvic Floor Special Questions - 11/19/19 0001    Prior Pregnancies Yes    Number of Pregnancies 3     Number of C-Sections 3    Currently Sexually Active Yes    Is this Painful Yes    History of sexually transmitted disease No    Marinoff Scale pain interrupts completion   does not want intecourse due to pain   Urinary Leakage No    Fecal incontinence No    Skin Integrity Intact   dry   External Palpation tenderness on ischiocavernosus and bulbcavernosus    Pelvic Floor Internal Exam Patient approves PT to assess pelvic floor and treatment    Exam Type Vaginal    Palpation tenderness located on the perineal body, bil. levator ani, right  obturator internist; decreased movement of the perineal body    Strength good squeeze, good lift, able to hold agaisnt strong resistance            [facility] Adult PT Treatment/Exercise - 11/19/19 0001      Self-Care   Self-Care Other Self-Care Comments    Other Self-Care Comments  education on vaginal lubricants and moisturizers for vaginal health; education on cleaning the vulvar area      Exercises   Exercises Other Exercises    Other Exercises  diaphragmatic breathing to elongate the pelvic floor; education on vulvar circular massage to elongate the tissue                  PT Education - 11/19/19 1114    Education Details education on vaginal moisturizers, bulging the pelvic floor, massaging the perineum, vulvar care    Person(s) Educated Patient    Methods Explanation;Demonstration;Handout    Comprehension Returned demonstration;Verbalized understanding            PT Short Term Goals - 11/19/19 1126      PT SHORT TERM GOAL #1   Title independent with initial HEP    Time 4    Period Weeks    Status New    Target Date 12/17/19      PT SHORT TERM GOAL #2   Title understand how moisturizers assist in  in vaginal health and reduce dryness    Time 4    Period Weeks    Target Date 12/17/19      PT SHORT TERM GOAL #3   Title understand what lubricants without glycerin are good for vaginal health    Time 4    Period Weeks     Status New    Target Date 12/17/19             PT Long Term Goals - 11/19/19 1241      PT LONG TERM GOAL #1   Title independent with advanced  HEP    Time 12    Period Weeks    Status New    Target Date 02/11/20      PT LONG TERM GOAL #2   Title pain with intercourse is </= 1/10 due to improved moisture and using appropriate lubricants    Time 12    Period Weeks    Status New    Target Date 02/11/20      PT LONG TERM GOAL #3   Title improve right hip mobility and reduction in pain so she is able to get into a comfortable position for intercourse    Time 12    Period Weeks    Status New    Target Date 02/11/20      PT LONG TERM GOAL #4   Title able to have a vaginal exam due to improved elongation of the pelvic floor muscle and minimal to no pain upon palpation    Time 12    Period Weeks    Status New    Target Date 02/11/20                  Plan - 11/19/19 1118    Clinical Impression Statement Patient is a 54 year old female with vaginal and right hip pain for the past several months. The vaginal pain is intermittent at level 9/10 with penile penetration and vaginal exams. The right hip pain is intermittent at level 9/10 with walking. Patient reports numbness in the right foot and will be seeing a neurologist in the future. Patient reports right hip pain with certain positions with intercourse. She will limp on the right leg when she walks. Lumbar extension decreased by 25%. Decreased bilateral hip external rotation passively. right hip strength averages 4/5. Pelvic floor strength is 4/5 but difficulty with relaxing. Tenderness located in the perineal body, bilateral levator ani, right obturaotr internist, bilateral ischiocavernosus and bulbocavernosus, and right gluteal. Right ilium is rotated anteriorly. Patient will benefit from skilled therapy to improve tissue mobility, reduce pain and improve function.    Personal Factors and Comorbidities Comorbidity 1;Sex;Age     Comorbidities Diabetes    Examination-Activity Limitations Bed Mobility    Examination-Participation Restrictions Interpersonal Relationship;Community Activity    Stability/Clinical Decision Making Evolving/Moderate complexity    Clinical Decision Making Low    Rehab Potential Excellent    PT Frequency 1x / week    PT Duration 12 weeks    PT Treatment/Interventions ADLs/Self Care Home Management;Biofeedback;Cryotherapy;Electrical Stimulation;Iontophoresis 4mg /ml Dexamethasone;Moist Heat;Ultrasound;Gait training;Therapeutic activities;Therapeutic exercise;Manual techniques;Patient/family education;Neuromuscular re-education;Passive range of motion;Dry needling;Spinal Manipulations;Joint Manipulations    PT Next Visit Plan correct pelvic alignment, education on lubricats, perineal body massage, go over pelvic bulge, expanding lower rib cage, hip stretches, internal soft tissue work    [employer] and Agree with Plan of Care Patient           Patient will benefit from skilled  therapeutic intervention in order to improve the following deficits and impairments:  Decreased activity tolerance, Decreased strength, Increased fascial restricitons, Pain, Decreased mobility, Increased muscle spasms, Decreased range of motion, Decreased coordination  Visit Diagnosis: Muscle weakness (generalized) - Plan: PT plan of care cert/re-cert  Cramp and spasm - Plan: PT plan of care cert/re-cert  Pain in right hip - Plan: PT plan of care cert/re-cert  Stiffness of right hip, not elsewhere classified - Plan: PT plan of care cert/re-cert  Dyspareunia in female - Plan: PT plan of care cert/re-cert     Problem List Patient Active Problem List   Diagnosis Date Noted  . Impingement syndrome of left shoulder 01/23/2018    Earlie Counts, PT 11/19/19 12:52 PM   [facility] at [facility] [address] [place], [place], [address] Phone: [phone]   Fax:   [phone]  Name: Aarica Wax MRN: [id number] Date of Birth: 04-19-1966

## 2019-11-26 ENCOUNTER — Encounter: Payer: BLUE CROSS/BLUE SHIELD | Admitting: Physical Therapy

## 2019-11-26 ENCOUNTER — Encounter: Payer: Self-pay | Admitting: Physical Therapy

## 2019-11-26 ENCOUNTER — Other Ambulatory Visit: Payer: Self-pay

## 2019-11-26 DIAGNOSIS — R252 Cramp and spasm: Secondary | ICD-10-CM

## 2019-11-26 DIAGNOSIS — M6281 Muscle weakness (generalized): Secondary | ICD-10-CM

## 2019-11-26 DIAGNOSIS — M25651 Stiffness of right hip, not elsewhere classified: Secondary | ICD-10-CM

## 2019-11-26 DIAGNOSIS — M25551 Pain in right hip: Secondary | ICD-10-CM

## 2019-11-26 DIAGNOSIS — N941 Unspecified dyspareunia: Secondary | ICD-10-CM

## 2019-11-26 NOTE — Therapy (Signed)
[place] at [facility] [address], [place], [place], [address] Phone: [phone]   Fax:  [phone]  Physical Therapy Treatment  Patient Details  Name: Ann Foster MRN: [id number] Date of Birth: 07-16-1965 Referring Provider (PT): Dr. Silas Sacramento   Encounter Date: 11/26/2019   PT End of Session - 11/26/19 1023    Visit Number 2    Date for PT Re-Evaluation 02/11/20    Authorization Type BCBS    Authorization - Visit Number 2    Authorization - Number of Visits 30    PT Start Time 3354    PT Stop Time 1116    PT Time Calculation (min) 55 min    Activity Tolerance Patient tolerated treatment well;No increased pain    Behavior During Therapy WFL for tasks assessed/performed           Past Medical History:  Diagnosis Date  . Ankle fracture 2016   Right  . Aortic atherosclerosis (HCC)    trace calcific atherosclerosis aortic arch per ct neck done 12-22-17  . Bronchitis   . Diabetes mellitus without complication ([place])   . Hypertension   . OA (osteoarthritis) of shoulder    left shoulder, both knees arthritis    Past Surgical History:  Procedure Laterality Date  . NO PAST SURGERIES      There were no vitals filed for this visit.                      [facility] Adult PT Treatment/Exercise - 11/26/19 0001      Self-Care   Self-Care Other Self-Care Comments    Other Self-Care Comments  reviewed information on vaginal lubricants and mositurizers      Lumbar Exercises: Stretches   Active Hamstring Stretch Right;Left;1 rep;30 seconds    Active Hamstring Stretch Limitations supine    Hip Flexor Stretch Right;2 reps;20 seconds    Hip Flexor Stretch Limitations by therapist    Piriformis Stretch Right;5 reps;10 seconds    Other Lumbar Stretch Exercise standing righ tquadratus stretch      Manual Therapy   Manual Therapy Joint mobilization;Soft tissue mobilization    Manual therapy comments to  assess muscles for dry needling    Joint Mobilization distraction, inferior glide, and lateral glide and anterior glide of right hip grade 3    Soft tissue mobilization righ tlumbar paraspinals, quadratus, gluteal, along the right SI joint, right coccygeus, levator ani release, right quadriceps, ITB, and rectus            Trigger Point Dry Needling - 11/26/19 0001    Consent Given? Yes    Education Handout Provided Yes    Muscles Treated Lower Quadrant Quadriceps;Adductor longus/brevis/magnus;Rectus femoris   right   Muscles Treated Back/Hip Gluteus medius;Lumbar multifidi;Quadratus lumborum   right   Quadriceps Response Twitch response elicited;Palpable increased muscle length    Adductor Response Twitch response elicited;Palpable increased muscle length    Rectus femoris Response Twitch response elicited;Palpable increased muscle length    Gluteus Medius Response Twitch response elicited;Palpable increased muscle length    Lumbar multifidi Response Twitch response elicited;Palpable increased muscle length    Quadratus Lumborum Response Twitch response elicited;Palpable increased muscle length                PT Education - 11/26/19 1118    Education Details Access Code: [email or web address]; information on dry needling    Person(s) Educated Patient    Methods Explanation;Demonstration;Verbal cues;Handout  Comprehension Returned demonstration;Verbalized understanding            PT Short Term Goals - 11/26/19 1128      PT SHORT TERM GOAL #1   Title independent with initial HEP    Time 4    Period Weeks    Status On-going    Target Date 12/17/19      PT SHORT TERM GOAL #2   Title understand how moisturizers assist in in vaginal health and reduce dryness    Time 4    Period Weeks    Status Achieved    Target Date 12/17/19      PT SHORT TERM GOAL #3   Title understand what lubricants without glycerin are good for vaginal health    Period Weeks    Status Achieved    Target  Date 12/17/19             PT Long Term Goals - 11/19/19 1241      PT LONG TERM GOAL #1   Title independent with advanced  HEP    Time 12    Period Weeks    Status New    Target Date 02/11/20      PT LONG TERM GOAL #2   Title pain with intercourse is </= 1/10 due to improved moisture and using appropriate lubricants    Time 12    Period Weeks    Status New    Target Date 02/11/20      PT LONG TERM GOAL #3   Title improve right hip mobility and reduction in pain so she is able to get into a comfortable position for intercourse    Time 12    Period Weeks    Status New    Target Date 02/11/20      PT LONG TERM GOAL #4   Title able to have a vaginal exam due to improved elongation of the pelvic floor muscle and minimal to no pain upon palpation    Time 12    Period Weeks    Status New    Target Date 02/11/20                 Plan - 11/26/19 1023    Clinical Impression Statement Patient had many trigger points in the righ tupper leg, lumbar and gluteal referring pain into the pelvic floor. After the dry needling the muscle sbecame softer and she was able to move her right hip with greater ease to work on her pelvic floor area. Patient is using her moisturizers to the pelvic floor and feels a difference. At the end of therapy she was able to do a hamstring and piriformis stretch with greater ease. Patient will benefit from skilled therapy to improve tissue mobilty, and reduce pain and improve function.    Personal Factors and Comorbidities Comorbidity 1;Sex;Age    Comorbidities Diabetes    Examination-Activity Limitations Bed Mobility    Examination-Participation Restrictions Interpersonal Relationship;Community Activity    Stability/Clinical Decision Making Evolving/Moderate complexity    Rehab Potential Excellent    PT Frequency 1x / week    PT Duration 12 weeks    PT Treatment/Interventions ADLs/Self Care Home Management;Biofeedback;Cryotherapy;Electrical  Stimulation;Iontophoresis 4mg /ml Dexamethasone;Moist Heat;Ultrasound;Gait training;Therapeutic activities;Therapeutic exercise;Manual techniques;Patient/family education;Neuromuscular re-education;Passive range of motion;Dry needling;Spinal Manipulations;Joint Manipulations    PT Next Visit Plan correct pelvic alignment, education on lubricats, perineal body massage, go over pelvic bulge, expanding lower rib cage, hip stretches, internal soft tissue work; assess dry needling  PT Home Exercise Plan Access Code: [email or web address]    Recommended Other Services MD signed initial eval    Consulted and Agree with Plan of Care Patient           Patient will benefit from skilled therapeutic intervention in order to improve the following deficits and impairments:  Decreased activity tolerance, Decreased strength, Increased fascial restricitons, Pain, Decreased mobility, Increased muscle spasms, Decreased range of motion, Decreased coordination  Visit Diagnosis: Muscle weakness (generalized)  Cramp and spasm  Pain in right hip  Stiffness of right hip, not elsewhere classified  Dyspareunia in female     Problem List Patient Active Problem List   Diagnosis Date Noted  . Impingement syndrome of left shoulder 01/23/2018    Earlie Counts, PT 11/26/19 11:30 AM   [place] at [facility] [address], [place], [place], [address] Phone: [phone]   Fax:  [phone]  Name: Ann Foster MRN: [id number] Date of Birth: 1966/01/26

## 2019-11-26 NOTE — Patient Instructions (Signed)
Access Code: [email or web address] URL: [email or web address] Date: 11/26/2019 Prepared by: Earlie Counts  Exercises Hooklying Active Hamstring Stretch - 1 x daily - 7 x weekly - 1 sets - 2 reps - 30 sec hold Supine Figure 4 Piriformis Stretch - 1 x daily - 7 x weekly - 1 sets - 10 reps - 2 sec hold Standing Quadratus Lumborum Stretch with Doorway - 1 x daily - 7 x weekly - 1 sets - 2 reps - 15 sec hold  Patient Education Trigger Point Dry Needling Earlie Counts, PT [facility] [address], [place], [place] [address] W: [phone] [email or web address]

## 2019-12-06 ENCOUNTER — Encounter: Payer: Self-pay | Admitting: Obstetrics & Gynecology

## 2019-12-06 ENCOUNTER — Other Ambulatory Visit: Payer: Self-pay

## 2019-12-06 ENCOUNTER — Ambulatory Visit: Payer: BLUE CROSS/BLUE SHIELD | Admitting: Obstetrics & Gynecology

## 2019-12-06 VITALS — BP 131/72 | HR 83 | Ht 63.0 in | Wt 190.8 lb

## 2019-12-06 DIAGNOSIS — N952 Postmenopausal atrophic vaginitis: Secondary | ICD-10-CM

## 2019-12-06 NOTE — Progress Notes (Signed)
Note reviewed   Last note from Pts visit:  Assessment & Plan:  54 yo female with dyspareunia and vaginal dryness  1.  Check for vaginitis (returned BV-->Rx with Flagyl) 2.  Intrarosa (with prescription drug card) for vaginal atrophy 3.  Referral to in house PT for vaginal pain and dyspareunia. 4.  RTC in 8 weeks to re eval.        Electronically signed by Guss Bunde, MD at 10/30/2019 1:58 PM Woodroe Mode, MD

## 2019-12-06 NOTE — Progress Notes (Signed)
  Subjective:sx have improved     Patient ID: Ann Foster, female   DOB: [birth year], 54 y.o.   MRN: [id number]  HPI [id number] She used the medication prescribed in June and has been going to PT with good improvement. She will finish the course of treatment Current Outpatient Medications on File Prior to Visit  Medication Sig Dispense Refill  . atorvastatin (LIPITOR) 20 MG tablet Take 1 tablet (20 mg total) by mouth daily. 90 tablet 0  . Blood Glucose Monitoring Suppl (CONTOUR BLOOD GLUCOSE SYSTEM) w/Device KIT E11.65 Use as instructed. Check blood glucose level by fingerstick twice per day. 1 kit 0  . glucose blood (CONTOUR NEXT TEST) test strip Use as instructed. Check blood glucose level by fingerstick twice per day.  E11.65 100 each 12  . ibuprofen (ADVIL,MOTRIN) 200 MG tablet Take 400-600 mg by mouth every 6 (six) hours as needed for headache (pain).    . metFORMIN (GLUCOPHAGE) 500 MG tablet Take 2 tablets (1,000 mg total) by mouth 2 (two) times daily with a meal. 360 tablet 0  . Prasterone (INTRAROSA) 6.5 MG INST Insert one capsule vaginally 28 each 3  . pregabalin (LYRICA) 75 MG capsule Take 1 capsule (75 mg total) by mouth 2 (two) times daily. 60 capsule 0  . ramipril (ALTACE) 2.5 MG capsule Take 1 capsule (2.5 mg total) by mouth daily. 90 capsule 0  . TRUEplus Lancets 28G MISC 1 each by Does not apply route 2 (two) times daily. E11.65 200 each 6  . [DISCONTINUED] lisinopril (ZESTRIL) 2.5 MG tablet Take 1 tablet (2.5 mg total) by mouth 2 (two) times daily. 60 tablet 1   No current facility-administered medications on file prior to visit.     Review of Systems  Constitutional: Negative.   Gastrointestinal: Negative.   Genitourinary: Negative.        Objective:   Physical Exam Constitutional:      Appearance: Normal appearance.  Pulmonary:     Effort: Pulmonary effort is normal.  Abdominal:     General: There is no distension.  Neurological:     Mental Status: She is  alert.        Assessment:     Vaginal atrophy responding well to treatment, PT    Plan:      finish treatment course as ordered Routine gyn care, primary care. Mentioned having routine mammography  Woodroe Mode, MD

## 2019-12-06 NOTE — Patient Instructions (Signed)
Atrophic Vaginitis Atrophic vaginitis is a condition in which the tissues that line the vagina become dry and thin. This condition occurs in women who have stopped having their period. It is caused by a drop in a female hormone (estrogen). This hormone helps:  To keep the vagina moist.  To make a clear fluid. This clear fluid helps: ? To make the vagina ready for sex. ? To protect the vagina from infection. If the lining of the vagina is dry and thin, it may cause irritation, burning, or itchiness. It may also:  Make sex painful.  Make an exam of your vagina painful.  Cause bleeding.  Make you lose interest in sex.  Cause a burning feeling when you pee (urinate).  Cause a brown or yellow fluid to come from your vagina. Some women do not have symptoms. Follow these instructions at home: Medicines  Take over-the-counter and prescription medicines only as told by your doctor.  Do not use herbs or other medicines unless your doctor says it is okay.  Use medicines for for dryness. These include: ? Oils to make the vagina soft. ? Creams. ? Moisturizers. General instructions  Do not douche.  Do not use products that can make your vagina dry. These include: ? Scented sprays. ? Scented tampons. ? Scented soaps.  Sex can help increase blood flow and soften the tissue in the vagina. If it hurts to have sex: ? Tell your partner. ? Use products to make sex more comfortable. Use these only as told by your doctor. Contact a doctor if you:  Have discharge from the vagina that is different than usual.  Have a bad smell coming from your vagina.  Have new symptoms.  Do not get better.  Get worse. Summary  Atrophic vaginitis is a condition in which the lining of the vagina becomes dry and thin.  This condition affects women who have stopped having their periods.  Treatment may include using products that help make the vagina soft.  Call a doctor if do not get better with  treatment. This information is not intended to replace advice given to you by your health care provider. Make sure you discuss any questions you have with your health care provider. Document Revised: 05/29/2017 Document Reviewed: 05/29/2017 Elsevier Patient Education  2020 [employer]  

## 2019-12-10 ENCOUNTER — Encounter: Payer: BLUE CROSS/BLUE SHIELD | Attending: Obstetrics & Gynecology | Admitting: Physical Therapy

## 2019-12-10 ENCOUNTER — Other Ambulatory Visit: Payer: Self-pay

## 2019-12-10 ENCOUNTER — Encounter: Payer: Self-pay | Admitting: Physical Therapy

## 2019-12-10 DIAGNOSIS — M6281 Muscle weakness (generalized): Secondary | ICD-10-CM | POA: Diagnosis not present

## 2019-12-10 DIAGNOSIS — R252 Cramp and spasm: Secondary | ICD-10-CM

## 2019-12-10 DIAGNOSIS — M25551 Pain in right hip: Secondary | ICD-10-CM

## 2019-12-10 DIAGNOSIS — N941 Unspecified dyspareunia: Secondary | ICD-10-CM

## 2019-12-10 DIAGNOSIS — M25651 Stiffness of right hip, not elsewhere classified: Secondary | ICD-10-CM | POA: Diagnosis present

## 2019-12-10 MED FILL — ATORVASTATIN CALCIUM 20 MG: 20 | 30 days supply | Qty: 30 | Fill #1

## 2019-12-10 MED FILL — METFORMIN HCL 500 MG TABS: 500 | 30 days supply | Qty: 120 | Fill #0

## 2019-12-10 NOTE — Patient Instructions (Addendum)
[employer] Address: [address] [place], [place] [address] Phone: [phone] [email or web address]   [email or web address]    [email or web address]   Lubrication  Used for intercourse to reduce friction  Avoid ones that have glycerin, warming gels, tingling gels, icing or cooling gel, scented  Avoid parabens due to a preservative similar to female sex hormone  May need to be reapplied once or several times during sexual activity  Can be applied to both partners genitals prior to vaginal penetration to minimize friction or irritation  Prevent irritation and mucosal tears that cause post coital pain and increased the risk of vaginal and urinary tract infections  Oil-based lubricants cannot be used with condoms due to breaking them down.  Least likely to irritate vaginal tissue.   Plant based-lubes are safe  Silicone-based lubrication are thicker and last long and used for post-menopausal women  Vaginal Lubricators Here is a list of some suggested lubricators you can use for intercourse. Use the most hypoallergenic product.  You can place on you or your partner.   Slippery Stuff ( water based)  Sylk or Sliquid Natural H2O ( good  if frequent UTIs)- walmart, amazon  Sliquid organics silk-(aloe and silicone based )  [employer] (www.blossom-organics.com)- (aloe based )  Coconut oil, olive oil -not good with condoms   PJur Woman Nude- (water based) amazon  Uberlube- ( silicon) [place] has an organic one  Yes lubricant- (water based and has plant oil based similar to silicone) [employer] Platinum-Silicone, Target, [employer]reens  Olive and Bee intimate cream-  www.oliveandbee.com.au  Pink - [employer]  Erosense Sync- walmart, amazon  Good Clean love Things to avoid in lubricants are glycerin, warming gels, tingling gels, icing or cooling  gels, and scented gels.  Also  avoid Vaseline. KY jelly, Replens, and Astroglide kills good bacteria(lactobacilli)  Access Code: [email or web address] URL: [email or web address] Date: 12/10/2019 Prepared by: Earlie Counts  Exercises Hooklying Active Hamstring Stretch - 1 x daily - 7 x weekly - 1 sets - 2 reps - 30 sec hold Supine Figure 4 Piriformis Stretch - 1 x daily - 7 x weekly - 1 sets - 10 reps - 2 sec hold Standing Quadratus Lumborum Stretch with Doorway - 1 x daily - 7 x weekly - 1 sets - 2 reps - 15 sec hold Supine Diaphragmatic Breathing - 2 x daily - 7 x weekly - 1 sets - 10 reps  Patient Education Trigger Point Dry Needling  Earlie Counts, PT [facility] [place] [address] [place], [place] [address] W: [phone] [email or web address]

## 2019-12-10 NOTE — Therapy (Signed)
[place] at [facility] [address], [place], [place], [address] Phone: [phone]   Fax:  [phone]  Physical Therapy Treatment  Patient Details  Name: Ann Foster MRN: [id number] Date of Birth: 1965-09-20 Referring Provider (PT): Dr. Silas [place]   Encounter Date: 12/10/2019   PT End of Session - 12/10/19 1216    Visit Number 3    Date for PT Re-Evaluation 02/11/20    Authorization - Visit Number 3    Authorization - Number of Visits 30    PT Start Time 1109    PT Stop Time 1209    PT Time Calculation (min) 60 min    Activity Tolerance Patient tolerated treatment well;No increased pain    Behavior During Therapy WFL for tasks assessed/performed           Past Medical History:  Diagnosis Date  . Ankle fracture 2016   Right  . Aortic atherosclerosis (HCC)    trace calcific atherosclerosis aortic arch per ct neck done 12-22-17  . Bronchitis   . Diabetes mellitus without complication ([place])   . Hypertension   . OA (osteoarthritis) of shoulder    left shoulder, both knees arthritis    Past Surgical History:  Procedure Laterality Date  . NO PAST SURGERIES      There were no vitals filed for this visit.   Subjective Assessment - 12/10/19 1112    Subjective The dry needling helped. I do not feel the pressure in the hip. I am not as dry vaginally. No more burning when going to the bathroom. I have been scared to have intercourse.    Patient Stated Goals reduce pain    Currently in Pain? Yes    Pain Score 8     Pain Location Vagina    Pain Orientation Mid    Pain Descriptors / Indicators Burning    Pain Onset More than a month ago    Pain Frequency Intermittent    Aggravating Factors  during penile penetraction    Pain Relieving Factors no vaginal penetraction    Multiple Pain Sites Yes    Pain Score 5    Pain Location Hip    Pain Orientation Right    Pain Descriptors / Indicators Numbness;Penetrating     Pain Type Acute pain    Pain Onset More than a month ago    Pain Frequency Intermittent    Aggravating Factors  walking    Pain Relieving Factors medication, not overdue it                             [facility] Adult PT Treatment/Exercise - 12/10/19 0001      Self-Care   Self-Care Other Self-Care Comments    Other Self-Care Comments  education on different sex therapist in [place] to work on intimacy; education on different lubricants and what is helpful       Neuro Re-ed    Neuro Re-ed Details  breathing exercise to open the lower rib cage and bulge the pelvic floor      Manual Therapy   Manual Therapy Soft tissue mobilization;Joint mobilization;Myofascial release;Internal Pelvic Floor    Joint Mobilization bil. lower rib cage to open up with inhalation    Myofascial Release myofascial release around the introitus while monitoring for pain    Internal Pelvic Floor bilateral obturator internist, anterior portion of the puborectalis and bil. bulbocavernosus  PT Education - 12/10/19 1215    Education Details Access Code: [email or web address]; information on vaginal lubricants, information on sex therapist, educated on how to perform c-section scar and perineal soft tissue    Person(s) Educated Patient    Methods Explanation;Demonstration;Verbal cues;Handout    Comprehension Returned demonstration;Verbalized understanding            PT Short Term Goals - 12/10/19 1220      PT SHORT TERM GOAL #1   Title independent with initial HEP    Time 4    Status Achieved      PT SHORT TERM GOAL #2   Title understand how moisturizers assist in in vaginal health and reduce dryness    Time 4    Period Weeks    Status Achieved      PT SHORT TERM GOAL #3   Title understand what lubricants without glycerin are good for vaginal health    Time 4    Period Weeks    Status Achieved             PT Long Term Goals - 11/19/19 1241      PT LONG TERM  GOAL #1   Title independent with advanced  HEP    Time 12    Period Weeks    Status New    [place] Date 02/11/20      PT LONG TERM GOAL #2   Title pain with intercourse is </= 1/10 due to improved moisture and using appropriate lubricants    Time 12    Period Weeks    Status New    Target Date 02/11/20      PT LONG TERM GOAL #3   Title improve right hip mobility and reduction in pain so she is able to get into a comfortable position for intercourse    Time 12    Period Weeks    Status New    Target Date 02/11/20      PT LONG TERM GOAL #4   Title able to have a vaginal exam due to improved elongation of the pelvic floor muscle and minimal to no pain upon palpation    Time 12    Period Weeks    Status New    Target Date 02/11/20                 Plan - 12/10/19 1127    Clinical Impression Statement Patient reports no more burning in the vaginal area due to improved moisture. Patient is scared to have vaginal penetraction due to the pain in the past. Patient has less pressure in the hips after the manual work. Patient is now able to breath and bulge the pelvic floor to elongate the tissue. She has increased trigger points in the obturator internist, and perineal body. Patient understands how to perfrom c-section scar to improve mobility. Patient will benefit from skilled therapy to improve tissue mobility, reduce pain and improve function.    Personal Factors and Comorbidities Comorbidity 1;Sex;Age    Comorbidities Diabetes    Examination-Activity Limitations Bed Mobility    Examination-Participation Restrictions Interpersonal Relationship;Community Activity    Stability/Clinical Decision Making Evolving/Moderate complexity    Rehab Potential Excellent    PT Frequency 1x / week    PT Duration 12 weeks    PT Treatment/Interventions ADLs/Self Care Home Management;Biofeedback;Cryotherapy;Electrical Stimulation;Iontophoresis 4mg /ml Dexamethasone;Moist Heat;Ultrasound;Gait  training;Therapeutic activities;Therapeutic exercise;Manual techniques;Patient/family education;Neuromuscular re-education;Passive range of motion;Dry needling;Spinal Manipulations;Joint Manipulations    PT Next Visit Plan correct pelvic alignment,  perineal body massage,  internal soft tissue work;  dry needling to lumbar and right hip; hip IR with standing hip hinge    PT Home Exercise Plan Access Code: [email or web address]    Consulted and Agree with Plan of Care Patient           Patient will benefit from skilled therapeutic intervention in order to improve the following deficits and impairments:  Decreased activity tolerance, Decreased strength, Increased fascial restricitons, Pain, Decreased mobility, Increased muscle spasms, Decreased range of motion, Decreased coordination  Visit Diagnosis: Muscle weakness (generalized)  Cramp and spasm  Pain in right hip  Stiffness of right hip, not elsewhere classified  Dyspareunia in female     Problem List Patient Active Problem List   Diagnosis Date Noted  . Impingement syndrome of left shoulder 01/23/2018    Earlie Counts, PT 12/10/19 12:23 PM   [facility] [address], [place], [place], [address] Phone: [phone]   Fax:  [phone]  Name: Ann Foster MRN: [id number] Date of Birth: [birth year]

## 2019-12-17 ENCOUNTER — Other Ambulatory Visit: Payer: Self-pay

## 2019-12-17 ENCOUNTER — Encounter: Payer: BLUE CROSS/BLUE SHIELD | Admitting: Physical Therapy

## 2019-12-17 ENCOUNTER — Encounter: Payer: Self-pay | Admitting: Physical Therapy

## 2019-12-17 DIAGNOSIS — M25651 Stiffness of right hip, not elsewhere classified: Secondary | ICD-10-CM

## 2019-12-17 DIAGNOSIS — N941 Unspecified dyspareunia: Secondary | ICD-10-CM

## 2019-12-17 DIAGNOSIS — M6281 Muscle weakness (generalized): Secondary | ICD-10-CM

## 2019-12-17 DIAGNOSIS — R252 Cramp and spasm: Secondary | ICD-10-CM

## 2019-12-17 DIAGNOSIS — M25551 Pain in right hip: Secondary | ICD-10-CM

## 2019-12-17 NOTE — Therapy (Signed)
[place] at [facility] [address], [place], [place], [address] Phone: [phone]   Fax:  [phone]  Physical Therapy Treatment  Patient Details  Name: Ann Foster MRN: [id number] Date of Birth: 1965/07/15 Referring Provider (PT): Dr. Silas [place]   Encounter Date: 12/17/2019   PT End of Session - 12/17/19 1227    Visit Number 4    Date for PT Re-Evaluation 02/11/20    Authorization Type BCBS    Authorization - Visit Number 4    Authorization - Number of Visits 30    PT Start Time 2423    PT Stop Time 5361    PT Time Calculation (min) 45 min    Activity Tolerance Patient tolerated treatment well;No increased pain    Behavior During Therapy WFL for tasks assessed/performed           Past Medical History:  Diagnosis Date  . Ankle fracture 2016   Right  . Aortic atherosclerosis (HCC)    trace calcific atherosclerosis aortic arch per ct neck done 12-22-17  . Bronchitis   . Diabetes mellitus without complication ([place])   . Hypertension   . OA (osteoarthritis) of shoulder    left shoulder, both knees arthritis    Past Surgical History:  Procedure Laterality Date  . NO PAST SURGERIES      There were no vitals filed for this visit.   Subjective Assessment - 12/17/19 1136    Subjective The vaginal dryness is doing okay. Had intercourse and struggled due to discomfort. I used lubricant. Pain was 50% better. The vaginal cream is expensive. Hip pain is not in the groin now. Less pain with turning over. When lay on it there is less pressure.    Patient Stated Goals reduce pain    Currently in Pain? Yes    Pain Score 5     Pain Location Vagina    Pain Orientation Mid    Pain Type Acute pain    Pain Onset More than a month ago    Pain Frequency Intermittent    Aggravating Factors  during penile penetration    Pain Relieving Factors no vaginal penetration    Multiple Pain Sites Yes    Pain Score 5    Pain  Location Hip    Pain Orientation Right    Pain Descriptors / Indicators Penetrating    Pain Type Acute pain    Pain Onset More than a month ago    Pain Frequency Intermittent    Aggravating Factors  walking, laying on right side    Pain Relieving Factors medication                          Pelvic Floor Special Questions - 12/17/19 0001    Pelvic Floor Internal Exam Patient approves PT to assess pelvic floor and treatment    Exam Type Vaginal    Strength good squeeze, good lift, able to hold agaisnt strong resistance             [facility] Adult PT Treatment/Exercise - 12/17/19 0001      Self-Care   Self-Care Other Self-Care Comments      Manual Therapy   Manual Therapy Internal Pelvic Floor    Internal Pelvic Floor right obturaotru internist, levator ani, bulbocavernosus, preineal body in right sidely and worked into the trigger points  PT Education - 12/17/19 1226    Education Details education on vaginal dilators    Person(s) Educated Patient    Methods Explanation;Handout    Comprehension Verbalized understanding;Returned demonstration            PT Short Term Goals - 12/10/19 1220      PT SHORT TERM GOAL #1   Title independent with initial HEP    Time 4    Status Achieved      PT SHORT TERM GOAL #2   Title understand how moisturizers assist in in vaginal health and reduce dryness    Time 4    Period Weeks    Status Achieved      PT SHORT TERM GOAL #3   Title understand what lubricants without glycerin are good for vaginal health    Time 4    Period Weeks    Status Achieved             PT Long Term Goals - 12/17/19 1231      PT LONG TERM GOAL #1   Title independent with advanced  HEP    Time 12    Period Weeks    Status On-going      PT LONG TERM GOAL #2   Title pain with intercourse is </= 1/10 due to improved moisture and using appropriate lubricants    Baseline 5/10    Time 12    Period Weeks     Status On-going      PT LONG TERM GOAL #3   Title improve right hip mobility and reduction in pain so she is able to get into a comfortable position for intercourse    Time 12    Period Weeks    Status On-going      PT LONG TERM GOAL #4   Title able to have a vaginal exam due to improved elongation of the pelvic floor muscle and minimal to no pain upon palpation    Time 12    Period Weeks    Status On-going                 Plan - 12/17/19 1227    Clinical Impression Statement Patient reports her pain during intercourse is 50% better and is level 5/10 instead of 8/10. Paitent had many trigger points in the right pelvic floor that also referred into the right hip. Patient had tightness in the perineal body. After therapy she was able to flex her right hip to 90 degrees in standing. Patient did not have pinpoint pain in the right hip after manual work. Pelvic floor strength was 4/5 with improved posterior muscle recruitment. Patient was educated on vaginal dilators and she will be purchasing to expand the vaginal canal. Patietn will  benefit from skilled therapy to improve tissue mobility, reduce pain and improve function.    Personal Factors and Comorbidities Comorbidity 1;Sex;Age    Examination-Activity Limitations Bed Mobility    Examination-Participation Restrictions Interpersonal Relationship;Community Activity    Stability/Clinical Decision Making Evolving/Moderate complexity    Rehab Potential Excellent    PT Frequency 1x / week    PT Duration 12 weeks    PT Treatment/Interventions ADLs/Self Care Home Management;Biofeedback;Cryotherapy;Electrical Stimulation;Iontophoresis 4mg /ml Dexamethasone;Moist Heat;Ultrasound;Gait training;Therapeutic activities;Therapeutic exercise;Manual techniques;Patient/family education;Neuromuscular re-education;Passive range of motion;Dry needling;Spinal Manipulations;Joint Manipulations    PT Next Visit Plan correct pelvic alignment,  perineal body  massage,  internal soft tissue work in righ tsidely, discuss dilators, work on rib expansion;  dry needling to lumbar  and right hip; hip IR with standing hip hinge    Consulted and Agree with Plan of Care Patient           Patient will benefit from skilled therapeutic intervention in order to improve the following deficits and impairments:  Decreased activity tolerance, Decreased strength, Increased fascial restricitons, Pain, Decreased mobility, Increased muscle spasms, Decreased range of motion, Decreased coordination  Visit Diagnosis: Muscle weakness (generalized)  Cramp and spasm  Pain in right hip  Stiffness of right hip, not elsewhere classified  Dyspareunia in female     Problem List Patient Active Problem List   Diagnosis Date Noted  . Impingement syndrome of left shoulder 01/23/2018    Earlie Counts, PT 12/17/19 12:33 PM   [place] [facility] at [facility] [address], [place], [place], [address] Phone: [phone]   Fax:  [phone]  Name: Tanija Germani MRN: [id number] Date of Birth: 1965/10/08

## 2019-12-17 NOTE — Patient Instructions (Addendum)
Intimate rose.com Large dilator set $119; handle is $20  Soul source.com Large set $139.99  VAginismus.com Whole set that is plastic is $44.95  Earlie Counts, PT [facility] [address] [place], [place] [address] W: [phone] [email or web address]

## 2019-12-24 ENCOUNTER — Other Ambulatory Visit: Payer: Self-pay

## 2019-12-24 ENCOUNTER — Encounter: Payer: Self-pay | Admitting: Physical Therapy

## 2019-12-24 ENCOUNTER — Encounter: Payer: BLUE CROSS/BLUE SHIELD | Admitting: Physical Therapy

## 2019-12-24 DIAGNOSIS — M6281 Muscle weakness (generalized): Secondary | ICD-10-CM

## 2019-12-24 DIAGNOSIS — N941 Unspecified dyspareunia: Secondary | ICD-10-CM

## 2019-12-24 DIAGNOSIS — R252 Cramp and spasm: Secondary | ICD-10-CM

## 2019-12-24 DIAGNOSIS — M25651 Stiffness of right hip, not elsewhere classified: Secondary | ICD-10-CM

## 2019-12-24 DIAGNOSIS — M25551 Pain in right hip: Secondary | ICD-10-CM

## 2019-12-24 NOTE — Therapy (Signed)
[place] at [facility] [address] [place], [place], [address] Phone: [phone]   Fax:  [phone]  Physical Therapy Treatment  Patient Details  Name: Ann Foster MRN: [id number] Date of Birth: [birth year] Referring Provider (PT): Dr. Silas [place]   Encounter Date: 12/24/2019   PT End of Session - 12/24/19 1137    Visit Number 5    Date for PT Re-Evaluation 02/11/20    Authorization Type BCBS    Authorization - Visit Number 5    Authorization - Number of Visits 30    PT Start Time 4008    PT Stop Time 6761    PT Time Calculation (min) 40 min    Activity Tolerance Patient tolerated treatment well;No increased pain    Behavior During Therapy [facility] for tasks assessed/performed           Past Medical History:  Diagnosis Date   Ankle fracture 2016   Right   Aortic atherosclerosis (HCC)    trace calcific atherosclerosis aortic arch per ct neck done 12-22-17   Bronchitis    Diabetes mellitus without complication (HCC)    Hypertension    OA (osteoarthritis) of shoulder    left shoulder, both knees arthritis    Past Surgical History:  Procedure Laterality Date   NO PAST SURGERIES      There were no vitals filed for this visit.   Subjective Assessment - 12/24/19 1137    Subjective I am having les [place]. I have not had intercourse. I still have the hip problem but I can lay onlger on it. The muscle spasms have eased up around the right hip. I have ordered the dilators and waiting for them to come. The cream are helping with the moisture in the vaginal area. I do not have the rough dry feeling.    Patient Stated Goals reduce pain    Currently in Pain? Yes    Pain Score 5     Pain Location Vagina    Pain Orientation Mid    Pain Descriptors / Indicators Burning    Pain Type Acute pain    Pain Onset More than a month ago    Pain Frequency Intermittent    Aggravating Factors  during penile penetration     Pain Relieving Factors no vaginal penetration    Pain Score 7    Pain Location Hip    Pain Orientation Right    Pain Descriptors / Indicators Penetrating    Pain Type Acute pain    Pain Onset More than a month ago    Pain Frequency Intermittent    Aggravating Factors  walking, laying on right side    Pain Relieving Factors medication                             [facility] Adult PT Treatment/Exercise - 12/24/19 0001      Neuro Re-ed    Neuro Re-ed Details  sidely breathing to expand the lower rib cage and engage the abdominals with exhale      Manual Therapy   Manual Therapy Soft tissue mobilization;Joint mobilization    Joint Mobilization rotational moblization to T4-L12 and lower rib cage mobilization   sidly   Soft tissue mobilization using the addaday to the righ tlumbar area and right hip and ITB band            Trigger Point Dry Needling - 12/24/19 0001  Consent Given? Yes    Education Handout Provided Previously provided    Muscles Treated Back/Hip Lumbar multifidi;Gluteus medius;Piriformis   rgiht   Gluteus Medius Response Twitch response elicited;Palpable increased muscle length    Piriformis Response Twitch response elicited;Palpable increased muscle length    Lumbar multifidi Response Twitch response elicited;Palpable increased muscle length                PT Education - 12/24/19 1217    Education Details rib cage mobility in sidely    Person(s) Educated Patient    Methods Explanation;Demonstration;Verbal cues;Handout    Comprehension Returned demonstration;Verbalized understanding            PT Short Term Goals - 12/10/19 1220      PT SHORT TERM GOAL #1   Title independent with initial HEP    Time 4    Status Achieved      PT SHORT TERM GOAL #2   Title understand how moisturizers assist in in vaginal health and reduce dryness    Time 4    Period Weeks    Status Achieved      PT SHORT TERM GOAL #3   Title understand what  lubricants without glycerin are good for vaginal health    Time 4    Period Weeks    Status Achieved             PT Long Term Goals - 12/24/19 1221      PT LONG TERM GOAL #1   Title independent with advanced  HEP    Time 12    Period Weeks    Status On-going      PT LONG TERM GOAL #2   Title pain with intercourse is </= 1/10 due to improved moisture and using appropriate lubricants    Time 12    Period Weeks    Status On-going      PT LONG TERM GOAL #3   Title improve right hip mobility and reduction in pain so she is able to get into a comfortable position for intercourse    Time 12    Period Weeks    Status On-going      PT LONG TERM GOAL #4   Title able to have a vaginal exam due to improved elongation of the pelvic floor muscle and minimal to no pain upon palpation    Time 12    Period Weeks    Status On-going                 Plan - 12/24/19 1218    Clinical Impression Statement Patient vaginal area feels softer since she is using the moisturizers. Patient has limited lower rib cage excursion whiel limits diaphragm and pelvic floro mobility. Patient needed lots of tactile and verbal cues to engage the ribs. Patient has tender areas posterior right hip and anterior that is coming from the hip joint. After therapy no pain in the back and pelvic floor and minimal in the right hip. Patient will benefit from skilled therapy to improve tissue mobility, reduce pain and improve function.    Personal Factors and Comorbidities Comorbidity 1;Sex;Age    Comorbidities Diabetes    Examination-Activity Limitations Bed Mobility    Examination-Participation Restrictions Interpersonal Relationship;Community Activity    Stability/Clinical Decision Making Evolving/Moderate complexity    Rehab Potential Excellent    PT Frequency 1x / week    PT Duration 12 weeks    PT Treatment/Interventions ADLs/Self Care Home Management;Biofeedback;Cryotherapy;Electrical  Stimulation;Iontophoresis  4mg /ml Dexamethasone;Moist Heat;Ultrasound;Gait training;Therapeutic activities;Therapeutic exercise;Manual techniques;Patient/family education;Neuromuscular re-education;Passive range of motion;Dry needling;Spinal Manipulations;Joint Manipulations    PT Next Visit Plan continue to work on rib cage mobility, see if she  has the dilators, see what MD says about her hip, hip IR with hip hinge    PT Home Exercise Plan Access Code: [email or web address]    Consulted and Agree with Plan of Care Patient           Patient will benefit from skilled therapeutic intervention in order to improve the following deficits and impairments:  Decreased activity tolerance, Decreased strength, Increased fascial restricitons, Pain, Decreased mobility, Increased muscle spasms, Decreased range of motion, Decreased coordination  Visit Diagnosis: Muscle weakness (generalized)  Cramp and spasm  Pain in right hip  Stiffness of right hip, not elsewhere classified  Dyspareunia in female     Problem List Patient Active Problem List   Diagnosis Date Noted   Impingement syndrome of left shoulder 01/23/2018    Earlie Counts, PT 12/24/19 12:22 PM   [place] [facility] [address] [place], [place], [address] Phone: [phone]   Fax:  [phone]  Name: Ann Foster MRN: [id number] Date of Birth: 1965-07-08

## 2019-12-31 ENCOUNTER — Encounter: Payer: Self-pay | Admitting: Physical Therapy

## 2019-12-31 ENCOUNTER — Other Ambulatory Visit: Payer: Self-pay

## 2019-12-31 ENCOUNTER — Encounter: Payer: BLUE CROSS/BLUE SHIELD | Attending: Obstetrics & Gynecology | Admitting: Physical Therapy

## 2019-12-31 DIAGNOSIS — M25651 Stiffness of right hip, not elsewhere classified: Secondary | ICD-10-CM

## 2019-12-31 DIAGNOSIS — N941 Unspecified dyspareunia: Secondary | ICD-10-CM | POA: Diagnosis present

## 2019-12-31 DIAGNOSIS — R252 Cramp and spasm: Secondary | ICD-10-CM | POA: Diagnosis present

## 2019-12-31 DIAGNOSIS — M6281 Muscle weakness (generalized): Secondary | ICD-10-CM | POA: Insufficient documentation

## 2019-12-31 DIAGNOSIS — M25551 Pain in right hip: Secondary | ICD-10-CM | POA: Diagnosis present

## 2019-12-31 MED FILL — RAMIPRIL 2.5 MG CAPSULE: 2.5 | 30 days supply | Qty: 30 | Fill #1

## 2019-12-31 NOTE — Therapy (Signed)
[place] [facility] [address] [place], [place], [address] Phone: [phone]   Fax:  [phone]  Physical Therapy Treatment  Patient Details  Name: Ann Foster MRN: [id number] Date of Birth: 02-18-1966 Referring Provider (PT): Dr. Silas Sacramento   Encounter Date: 12/31/2019   PT End of Session - 12/31/19 1132    Visit Number 6    Date for PT Re-Evaluation 02/11/20    Authorization Type BCBS    Authorization - Visit Number 6    Authorization - Number of Visits 30    PT Start Time 1130    PT Stop Time 5176    PT Time Calculation (min) 45 min    Activity Tolerance Patient tolerated treatment well;No increased pain    Behavior During Therapy WFL for tasks assessed/performed           Past Medical History:  Diagnosis Date  . Ankle fracture 2016   Right  . Aortic atherosclerosis (HCC)    trace calcific atherosclerosis aortic arch per ct neck done 12-22-17  . Bronchitis   . Diabetes mellitus without complication ([place])   . Hypertension   . OA (osteoarthritis) of shoulder    left shoulder, both knees arthritis    Past Surgical History:  Procedure Laterality Date  . NO PAST SURGERIES      There were no vitals filed for this visit.   Subjective Assessment - 12/31/19 1132    Subjective I did not see the orthopedic and I had to walk wiht a cane last week. No changes with the pelvic floro since last session. I ordered the device but has not come in yet.    Patient Stated Goals reduce pain    Currently in Pain? Yes    Pain Score 5     Pain Location Vagina    Pain Orientation Mid    Pain Descriptors / Indicators Burning    Pain Type Acute pain    Pain Onset More than a month ago    Pain Frequency Intermittent    Aggravating Factors  during penile penetraction    Pain Relieving Factors no vaginal penile penetration    Multiple Pain Sites Yes    Pain Score 9    Pain Location Hip    Pain Orientation Right     Pain Descriptors / Indicators Penetrating    Pain Type Acute pain    Pain Onset More than a month ago    Pain Frequency Intermittent    Aggravating Factors  walking    Pain Relieving Factors medication                             [facility] Adult PT Treatment/Exercise - 12/31/19 0001      Self-Care   Other Self-Care Comments  education on how to use a dilaotr and gave her samples of lubricants to use with the dilators      Manual Therapy   Manual Therapy Soft tissue mobilization;Internal Pelvic Floor    Manual therapy comments to assess muscle for dry needling    Soft tissue mobilization using the addaday to the right lumbar, and buttokcs to elongate the muscles after dry needling    Internal Pelvic Floor bulbocavernosus, levator ain and obturator internist in left sidely            Trigger Point Dry Needling - 12/31/19 0001    Consent Given? Yes  Education Handout Provided Previously provided    Muscles Treated Back/Hip Gluteus minimus;Gluteus medius;Gluteus maximus;Piriformis;Quadratus lumborum   right   Gluteus Minimus Response Twitch response elicited;Palpable increased muscle length    Gluteus Medius Response Twitch response elicited;Palpable increased muscle length    Gluteus Maximus Response Twitch response elicited;Palpable increased muscle length    Piriformis Response Twitch response elicited;Palpable increased muscle length    Quadratus Lumborum Response Twitch response elicited;Palpable increased muscle length                PT Education - 12/31/19 1234    Education Details how to use a dilator and progress herself    Person(s) Educated Patient    Methods Explanation    Comprehension Verbalized understanding            PT Short Term Goals - 12/10/19 1220      PT SHORT TERM GOAL #1   Title independent with initial HEP    Time 4    Status Achieved      PT SHORT TERM GOAL #2   Title understand how moisturizers assist in in vaginal  health and reduce dryness    Time 4    Period Weeks    Status Achieved      PT SHORT TERM GOAL #3   Title understand what lubricants without glycerin are good for vaginal health    Time 4    Period Weeks    Status Achieved             PT Long Term Goals - 12/31/19 1235      PT LONG TERM GOAL #1   Title independent with advanced  HEP    Time 12    Period Weeks    Status On-going      PT LONG TERM GOAL #2   Title pain with intercourse is </= 1/10 due to improved moisture and using appropriate lubricants    Baseline 5/10, has not gotten her dilators in the mail yet    Time 12    Period Weeks    Status On-going      PT LONG TERM GOAL #3   Title improve right hip mobility and reduction in pain so she is able to get into a comfortable position for intercourse    Baseline Sees MD on 8/26    Time 12    Period Weeks    Status On-going      PT LONG TERM GOAL #4   Title able to have a vaginal exam due to improved elongation of the pelvic floor muscle and minimal to no pain upon palpation    Time 12    Period Weeks    Status On-going                 Plan - 12/31/19 1236    Clinical Impression Statement Patient has not gotten her dilator in the mail yet. She is seeing her orthopedist on 8/26 for her right hip pain. Patient had trigger points in the pelvic floor muscles but after the manual work they were reduced. Patient pelvic floor muscles relaxed better with manual work in sidely compared to on her back. Patient has imporved circular contraction of the pelvic floor msucles after the manual work. Patient was educated on how to use the dilators for when they come in the mail.  Patient will benefi tfrom skilled therapy to improve tissue mobility, reduce pain, and improve function.    Personal Factors and Comorbidities Comorbidity 1;Sex;Age  Comorbidities Diabetes    Examination-Activity Limitations Bed Mobility    Examination-Participation Restrictions Interpersonal  Relationship;Community Activity    Stability/Clinical Decision Making Evolving/Moderate complexity    Rehab Potential Excellent    PT Frequency 1x / week    PT Duration 12 weeks    PT Treatment/Interventions ADLs/Self Care Home Management;Biofeedback;Cryotherapy;Electrical Stimulation;Iontophoresis 4mg /ml Dexamethasone;Moist Heat;Ultrasound;Gait training;Therapeutic activities;Therapeutic exercise;Manual techniques;Patient/family education;Neuromuscular re-education;Passive range of motion;Dry needling;Spinal Manipulations;Joint Manipulations    PT Next Visit Plan continue to work on rib cage mobility, see if she  has the dilators, see what MD says about her hip, hip IR with hip hinge    PT Home Exercise Plan Access Code: [email or web address]    Consulted and Agree with Plan of Care Patient           Patient will benefit from skilled therapeutic intervention in order to improve the following deficits and impairments:  Decreased activity tolerance, Decreased strength, Increased fascial restricitons, Pain, Decreased mobility, Increased muscle spasms, Decreased range of motion, Decreased coordination  Visit Diagnosis: Muscle weakness (generalized)  Cramp and spasm  Pain in right hip  Stiffness of right hip, not elsewhere classified  Dyspareunia in female     Problem List Patient Active Problem List   Diagnosis Date Noted  . Impingement syndrome of left shoulder 01/23/2018    Earlie Counts, PT 12/31/19 12:43 PM   [facility] at [facility] [address] [place], [place], [address] Phone: [phone]   Fax:  [phone]  Name: Ann Foster MRN: [id number] Date of Birth: 11-29-1965

## 2019-12-31 NOTE — Patient Instructions (Addendum)
PROTOCOL FOR VAGINAL DILATORS   1. Wash dilator with soap and water prior to insertion.    2. Lay on your back reclined. Knees are to be up and apart while on your bed or in the bathtub with warm water.   3. Lubricate the end of the dilator with a water-soluble lubricant.  4. Separate the labia.   5. Tense the pelvic floor muscles than relax; while relaxing, slide lubricated dilator( round side of dilator) into the vagina.  Insert toward the direction of your spine. Dilator should feel snug and no pain more than 3/10.   6. Tense muscles again while holding the dilator so it does not get pushed out; relax and slide it in a little further.   7. Try blowing out as if filling a balloon; this may relax the muscles and allow penetration.  Repeat blowing out to insert dilator further.  8. Keep dilator in for 10 minutes if tolerate, with the pelvic floor muscles relaxed to further stretch the canal.   9. Never force the dilator into the canal. 10. Once the dilator is comfortable start to move in and out, side to side, move your hips in different directions  11. 3-4 times per week 12. Progression of dilator.  a. When you are able to place dilator into vaginal canal and feel no pain or able to move without difficulty you are ready for the next size.  b. Before you go to the next size start with the original size for 2 minutes then use the next size up for 5 minutes. c. When the next size up is easy to use, do not have to start with the smaller size.  [facility] [address], [place], [place] [address] Phone # [phone] Fax [phone]

## 2020-01-08 ENCOUNTER — Telehealth: Payer: Self-pay | Admitting: Nurse Practitioner

## 2020-01-08 NOTE — Telephone Encounter (Signed)
Copied from [place] [id number]. Topic: General - Inquiry >> Jan 08, 2020 10:24 AM Scherrie Gerlach wrote: Reason for CRM: pt wants to know if Ann Foster has filled out her disability papers yet?  Pt states it has been a few months. She was contacted by her lawyer and he states he has never received papers from her pcp.

## 2020-01-10 NOTE — Telephone Encounter (Signed)
Spoke to patient stated that PCP do not have her disability form. Pt. Stated she will call her lawyer Monday and see if they can fax the form.

## 2020-01-21 ENCOUNTER — Other Ambulatory Visit: Payer: Self-pay

## 2020-01-21 ENCOUNTER — Encounter: Payer: Self-pay | Admitting: Physical Therapy

## 2020-01-21 ENCOUNTER — Encounter: Payer: BLUE CROSS/BLUE SHIELD | Admitting: Physical Therapy

## 2020-01-21 DIAGNOSIS — M25551 Pain in right hip: Secondary | ICD-10-CM

## 2020-01-21 DIAGNOSIS — M6281 Muscle weakness (generalized): Secondary | ICD-10-CM | POA: Diagnosis not present

## 2020-01-21 DIAGNOSIS — R252 Cramp and spasm: Secondary | ICD-10-CM

## 2020-01-21 DIAGNOSIS — M25651 Stiffness of right hip, not elsewhere classified: Secondary | ICD-10-CM

## 2020-01-21 DIAGNOSIS — N941 Unspecified dyspareunia: Secondary | ICD-10-CM

## 2020-01-21 MED FILL — METFORMIN HCL 500 MG TABS: 500 | 30 days supply | Qty: 120 | Fill #1

## 2020-01-21 MED FILL — CONTOUR NEXT STRIPS: 50 days supply | Qty: 100 | Fill #1

## 2020-01-21 NOTE — Therapy (Signed)
[place] at [facility] [address], [place], [place], [address] Phone: [phone]   Fax:  [phone]  Physical Therapy Treatment  Patient Details  Name: Ann Foster MRN: [id number] Date of Birth: February 09, 1966 Referring Provider (PT): Dr. Silas Sacramento   Encounter Date: 01/21/2020   PT End of Session - 01/21/20 1023    Visit Number 7    Date for PT Re-Evaluation 02/11/20    Authorization Type BCBS    Authorization - Visit Number 7    Authorization - Number of Visits 30    PT Start Time 7371    PT Stop Time 0626    PT Time Calculation (min) 55 min    Activity Tolerance Patient tolerated treatment well;No increased pain    Behavior During Therapy WFL for tasks assessed/performed           Past Medical History:  Diagnosis Date  . Ankle fracture 2016   Right  . Aortic atherosclerosis (HCC)    trace calcific atherosclerosis aortic arch per ct neck done 12-22-17  . Bronchitis   . Diabetes mellitus without complication ([place])   . Hypertension   . OA (osteoarthritis) of shoulder    left shoulder, both knees arthritis    Past Surgical History:  Procedure Laterality Date  . NO PAST SURGERIES      There were no vitals filed for this visit.   Subjective Assessment - 01/21/20 0923    Subjective I got the dilators. Pelvic pain with penile penetration is 80% better.    Patient Stated Goals reduce pain    Currently in Pain? Yes    Pain Score 5     Pain Location Vagina    Pain Orientation Mid    Pain Descriptors / Indicators Burning    Pain Type Acute pain    Pain Onset More than a month ago    Pain Frequency Intermittent    Aggravating Factors  during penlie penetration    Pain Relieving Factors no vaginal penile penetration    Multiple Pain Sites Yes    Pain Score 9    Pain Location Hip    Pain Orientation Right    Pain Descriptors / Indicators Penetrating    Pain Type Acute pain    Pain Onset More than a  month ago    Pain Frequency Intermittent    Aggravating Factors  walking    Pain Relieving Factors medication                             [facility] Adult PT Treatment/Exercise - 01/21/20 0001      Self-Care   Self-Care Other Self-Care Comments      Neuro Re-ed    Neuro Re-ed Details  contract th epelvic floor then tactile cues to fully relax; as patient contracts the pelvic floro tactile cues to not contract the gluteals just the pelvic floor and VC to fully contract      Manual Therapy   Manual Therapy Internal Pelvic Floor    Internal Pelvic Floor bulbocavernosus, perineal body, superficial transverse perineum, around the ischial spine, levator ain and obturator internist in left sidely                  PT Education - 01/21/20 1022    Education Details how to use the dilator to expand the tissu of the pelvic floor  and use to massage the muscles  Person(s) Educated Patient    Methods Explanation;Demonstration;Handout;Verbal cues    Comprehension Returned demonstration;Verbalized understanding            PT Short Term Goals - 12/10/19 1220      PT SHORT TERM GOAL #1   Title independent with initial HEP    Time 4    Status Achieved      PT SHORT TERM GOAL #2   Title understand how moisturizers assist in in vaginal health and reduce dryness    Time 4    Period Weeks    Status Achieved      PT SHORT TERM GOAL #3   Title understand what lubricants without glycerin are good for vaginal health    Time 4    Period Weeks    Status Achieved             PT Long Term Goals - 01/21/20 1029      PT LONG TERM GOAL #1   Title independent with advanced  HEP    Time 12    Period Weeks    Status On-going      PT LONG TERM GOAL #2   Title pain with intercourse is </= 1/10 due to improved moisture and using appropriate lubricants    Baseline 5/10, 80% better since initial evaluation    Time 12    Period Weeks    Status On-going      PT LONG  TERM GOAL #3   Title improve right hip mobility and reduction in pain so she is able to get into a comfortable position for intercourse    Time 12    Period Weeks      PT LONG TERM GOAL #4   Title able to have a vaginal exam due to improved elongation of the pelvic floor muscle and minimal to no pain upon palpation    Time 12    Period Weeks    Status On-going                 Plan - 01/21/20 1025    Clinical Impression Statement Patient received the vaginal dilators and understand how to use them at home. Patient reports no urinary leakage. Her right hip pain was 8/10 but after manual work to the Financial trader it decreased to 5/10. Patient was able to isolate the pelvic floor muscles with tactilce cues and fully relax by the end of the treatment. Patient continues to have trigger points in the pelvic floor muscles and some pain is related to the right hip pain. Patient reports her pain with penile penetration is 80% better since initial evaluation. Patient is having trouble with the cost of the estrogen cream to place vaginally and is looking into other options with her doctors. Patient was able to bend forward easier due to increased elongation of the pelvic floor muscles. Patient will benefit from skilled therapy to improve tissue mobility, reduce pain, and improve function.    Personal Factors and Comorbidities Comorbidity 1;Sex;Age    Comorbidities Diabetes    Examination-Activity Limitations Bed Mobility    Examination-Participation Restrictions Interpersonal Relationship;Community Activity    Stability/Clinical Decision Making Evolving/Moderate complexity    Rehab Potential Excellent    PT Frequency 1x / week    PT Duration 12 weeks    PT Treatment/Interventions ADLs/Self Care Home Management;Biofeedback;Cryotherapy;Electrical Stimulation;Iontophoresis 4mg /ml Dexamethasone;Moist Heat;Ultrasound;Gait training;Therapeutic activities;Therapeutic exercise;Manual  techniques;Patient/family education;Neuromuscular re-education;Passive range of motion;Dry needling;Spinal Manipulations;Joint Manipulations    PT Next Visit Plan continue to  work manual on the pelvic floor, see how it is going with the dilator, add trunk rotation    PT Home Exercise Plan Access Code: [email or web address]    Consulted and Agree with Plan of Care Patient           Patient will benefit from skilled therapeutic intervention in order to improve the following deficits and impairments:  Decreased activity tolerance, Decreased strength, Increased fascial restricitons, Pain, Decreased mobility, Increased muscle spasms, Decreased range of motion, Decreased coordination  Visit Diagnosis: Muscle weakness (generalized)  Cramp and spasm  Pain in right hip  Stiffness of right hip, not elsewhere classified  Dyspareunia in female     Problem List Patient Active Problem List   Diagnosis Date Noted  . Impingement syndrome of left shoulder 01/23/2018    Earlie Counts, PT 01/21/20 10:32 AM   [facility] [address], [place], [place], [address] Phone: [phone]   Fax:  [phone]  Name: Ann Foster MRN: [id number] Date of Birth: [birth year]

## 2020-01-21 NOTE — Patient Instructions (Addendum)
PROTOCOL FOR VAGINAL DILATORS   1. Wash dilator with soap and water prior to insertion.    2. Lay on your back reclined. Knees are to be up and apart while on your bed or in the bathtub with warm water.   3. Lubricate the end of the dilator with a water-soluble lubricant.  4. Separate the labia.   5. Tense the pelvic floor muscles than relax; while relaxing, slide lubricated dilator( round side of dilator) into the vagina.  Insert toward the direction of your spine. Dilator should feel snug and no pain more than 3/10.   6. Tense muscles again while holding the dilator so it does not get pushed out; relax and slide it in a little further.   7. Try blowing out as if filling a balloon; this may relax the muscles and allow penetration.  Repeat blowing out to insert dilator further.  8. Keep dilator in for 10 minutes if tolerate, with the pelvic floor muscles relaxed to further stretch the canal.   9. Never force the dilator into the canal. 10. Once the dilator is comfortable start to move in and out, side to side, move your hips in different directions  11. 3-4 times per week 12. Progression of dilator.  a. When you are able to place dilator into vaginal canal and feel no pain or able to move without difficulty you are ready for the next size.  b. Before you go to the next size start with the original size for 2 minutes then use the next size up for 5 minutes. c. When the next size up is easy to use, do not have to start with the smaller size.  Earlie Counts, PT [facility] [address], [place], [place] [address] W: [phone] [email or web address]

## 2020-01-23 ENCOUNTER — Ambulatory Visit (INDEPENDENT_AMBULATORY_CARE_PROVIDER_SITE_OTHER): Payer: BLUE CROSS/BLUE SHIELD | Admitting: Podiatry

## 2020-01-23 ENCOUNTER — Other Ambulatory Visit: Payer: Self-pay

## 2020-01-23 DIAGNOSIS — M79673 Pain in unspecified foot: Secondary | ICD-10-CM | POA: Diagnosis not present

## 2020-01-23 DIAGNOSIS — M722 Plantar fascial fibromatosis: Secondary | ICD-10-CM

## 2020-01-23 DIAGNOSIS — M79604 Pain in right leg: Secondary | ICD-10-CM

## 2020-01-23 DIAGNOSIS — E1149 Type 2 diabetes mellitus with other diabetic neurological complication: Secondary | ICD-10-CM | POA: Diagnosis not present

## 2020-01-23 DIAGNOSIS — G629 Polyneuropathy, unspecified: Secondary | ICD-10-CM | POA: Diagnosis not present

## 2020-01-23 DIAGNOSIS — G8929 Other chronic pain: Secondary | ICD-10-CM

## 2020-01-23 MED ORDER — PREGABALIN 150 MG PO CAPS
150.0000 mg | ORAL_CAPSULE | Freq: Two times a day (BID) | ORAL | 2 refills | Status: DC
Start: 2020-01-23 — End: 2021-11-29

## 2020-01-23 MED FILL — PREGABALIN 150 MG CAPS: 150 | 30 days supply | Qty: 60 | Fill #0

## 2020-01-24 NOTE — Progress Notes (Signed)
Subjective: 54 year old female presents the office today for follow evaluation of neuropathy as well as right leg pain.  She is currently on Lyrica 75 mg which helps some.  She is scheduled to see neurology next week.  She still describes sharp pain coming from her hip down to her foot.  She states that she is unable to walk far distances because of discomfort.  She also has some burning to her left foot but not the pain going down the leg.  Also still has pain in her foot.  No recent injuries.  No falls. Denies any systemic complaints such as fevers, chills, nausea, vomiting. No acute changes since last appointment, and no other complaints at this time.   Objective: AAO x3, NAD DP/PT pulses palpable bilaterally, CRT less than 3 seconds There is tenderness palpation along the plantar medial tubercle of the calcaneus with insertion of the plantar fascia and diffusely submetatarsal area with no specific area of pinpoint tenderness.   Even while in the office in sitting she was having sharp pains going down her leg. No pain with calf compression, swelling, warmth, erythema  Assessment: Plantar fasciitis/metatarsalgia right foot however still with neuropathy/nerve issues with the right leg worse than left  Plan: -All treatment options discussed with the patient including all alternatives, risks, complications.  -We will increase gabapentin to 150 mg twice a day.  She is follow-up with neurology next week. -We will order physical therapy for the right foot plantar fracture Foot pain -Vascular studies -Patient encouraged to call the office with any questions, concerns, change in symptoms.   Trula Slade DPM

## 2020-01-31 ENCOUNTER — Encounter: Payer: Self-pay | Admitting: Neurology

## 2020-01-31 ENCOUNTER — Other Ambulatory Visit: Payer: Self-pay

## 2020-01-31 ENCOUNTER — Ambulatory Visit (INDEPENDENT_AMBULATORY_CARE_PROVIDER_SITE_OTHER): Payer: BLUE CROSS/BLUE SHIELD | Admitting: Neurology

## 2020-01-31 VITALS — BP 137/80 | HR 86 | Ht 62.0 in | Wt 192.0 lb

## 2020-01-31 DIAGNOSIS — M4306 Spondylolysis, lumbar region: Secondary | ICD-10-CM | POA: Diagnosis not present

## 2020-01-31 DIAGNOSIS — M5416 Radiculopathy, lumbar region: Secondary | ICD-10-CM

## 2020-01-31 DIAGNOSIS — M4807 Spinal stenosis, lumbosacral region: Secondary | ICD-10-CM

## 2020-01-31 DIAGNOSIS — M48062 Spinal stenosis, lumbar region with neurogenic claudication: Secondary | ICD-10-CM

## 2020-01-31 MED ORDER — CYCLOBENZAPRINE HCL 5 MG PO TABS
5.0000 mg | ORAL_TABLET | Freq: Every evening | ORAL | 1 refills | Status: DC | PRN
Start: 2020-01-31 — End: 2021-08-16

## 2020-01-31 MED FILL — CYCLOBENZAPRINE 5 MG TABLET: 5 | 30 days supply | Qty: 30 | Fill #0

## 2020-01-31 NOTE — Patient Instructions (Addendum)
MRI lumbar spine without contrast  Start flexeril 5mg  at bedtime   Continue Lyrica 150mg  twice daily  Start physical therapy for right leg pain

## 2020-01-31 NOTE — Progress Notes (Signed)
[facility] Clinic Note - Initial Visit   Date: 01/31/20  Amal Renbarger MRN: [id number] DOB: 1965/11/24   Dear [name], NP:  Thank you for your kind referral of Freddy Spadafora for consultation of right leg pain. Although her history is well known to you, please allow [place] to reiterate it for the purpose of our medical record. The patient was accompanied to the clinic by self.   History of Present Illness: Ann Foster is a 54 y.o. left-handed female with diabetes mellitus, tobacco use, hypertension, and hyperlipidemia presenting for evaluation of right leg pain.   Starting around Jan/February 2021, she began having spells of burning sharp pain over the right hip down into her leg and into the last two toes.  It is worse with standing, walking, and prolonged sitting.  She endorses right sided low back pain.  Her right leg feel unstable when walking.  No radicular pain in the left leg.  She has some intermittent numbness/tingling in the toes.  She has spasms and cramps in the right leg.  No preceding injury. She takes Lyrica $RemoveBefo'150mg'ORQPHJKgAJr$  twice daily which provides some relief, but does not completely alleviate discomfort.  She was last working in 2020 and previously worked in [employer] and at [employer].   Out-side paper records, electronic medical record, and images have been reviewed where available and summarized as:  Lab Results  Component Value Date   HGBA1C 6.8 (A) 10/29/2019    Past Medical History:  Diagnosis Date  . Ankle fracture 2016   Right  . Aortic atherosclerosis (HCC)    trace calcific atherosclerosis aortic arch per ct neck done 12-22-17  . Bronchitis   . Diabetes mellitus without complication ([place])   . Hypertension   . OA (osteoarthritis) of shoulder    left shoulder, both knees arthritis    Past Surgical History:  Procedure Laterality Date  . NO PAST SURGERIES       Medications:  Outpatient Encounter  Medications as of 01/31/2020  Medication Sig  . atorvastatin (LIPITOR) 20 MG tablet Take 1 tablet (20 mg total) by mouth daily.  . Blood Glucose Monitoring Suppl (CONTOUR BLOOD GLUCOSE SYSTEM) w/Device KIT E11.65 Use as instructed. Check blood glucose level by fingerstick twice per day.  Marland Kitchen glucose blood (CONTOUR NEXT TEST) test strip Use as instructed. Check blood glucose level by fingerstick twice per day.  E11.65  . ibuprofen (ADVIL,MOTRIN) 200 MG tablet Take 400-600 mg by mouth every 6 (six) hours as needed for headache (pain).  . metFORMIN (GLUCOPHAGE) 500 MG tablet Take 2 tablets (1,000 mg total) by mouth 2 (two) times daily with a meal.  . Prasterone (INTRAROSA) 6.5 MG INST Insert one capsule vaginally  . pregabalin (LYRICA) 150 MG capsule Take 1 capsule (150 mg total) by mouth 2 (two) times daily.  . ramipril (ALTACE) 2.5 MG capsule Take 1 capsule (2.5 mg total) by mouth daily.  . TRUEplus Lancets 28G MISC 1 each by Does not apply route 2 (two) times daily. E11.65  . [DISCONTINUED] lisinopril (ZESTRIL) 2.5 MG tablet Take 1 tablet (2.5 mg total) by mouth 2 (two) times daily.  . [DISCONTINUED] pregabalin (LYRICA) 75 MG capsule Take 1 capsule (75 mg total) by mouth 2 (two) times daily. (Patient not taking: Reported on 01/31/2020)   No facility-administered encounter medications on file as of 01/31/2020.    Allergies: No Known Allergies  Family History: Family History  Problem Relation Age of Onset  . Cancer  Mother   . Breast cancer Mother   . Cancer Father   . Colon cancer Father   . Diabetes Son     Social History: Social History   Tobacco Use  . Smoking status: Current Some Day Smoker    Packs/day: 0.30    Types: Cigarettes  . Smokeless tobacco: Never Used  . Tobacco comment: stopped 2 months ago, smokes 1-2 cigarettes day now  Vaping Use  . Vaping Use: Never used  Substance Use Topics  . Alcohol use: Yes    Comment: Occasional  . Drug use: No   Social History   Social  History Narrative  . Not on file    Vital Signs:  BP 137/80   Pulse 86   Ht $R'5\' 2"'Xu$  (1.575 m)   Wt 192 lb (87.1 kg)   SpO2 100%   BMI 35.12 kg/m   Neurological Exam: MENTAL STATUS including orientation to time, place, person, recent and remote memory, attention span and concentration, language, and fund of knowledge is normal.  Speech is not dysarthric.  CRANIAL NERVES: II:  No visual field defects.   III-IV-VI: Pupils equal round and reactive.  Normal conjugate, extra-ocular eye movements in all directions of gaze.  No nystagmus.  No ptosis.   V:  Normal facial sensation.    VII:  Normal facial symmetry and movements.   VIII:  Normal hearing and vestibular function.   IX-X:  Normal palatal movement.   XI:  Normal shoulder shrug and head rotation.   XII:  Normal tongue strength and range of motion, no deviation or fasciculation.  MOTOR:  No atrophy, fasciculations or abnormal movements.  No pronator drift.   Upper Extremity:  Right  Left  Deltoid  5/5   5/5   Biceps  5/5   5/5   Triceps  5/5   5/5   Infraspinatus 5/5  5/5  Medial pectoralis 5/5  5/5  Wrist extensors  5/5   5/5   Wrist flexors  5/5   5/5   Finger extensors  5/5   5/5   Finger flexors  5/5   5/5   Dorsal interossei  5/5   5/5   Abductor pollicis  5/5   5/5   Tone (Ashworth scale)  0  0   Lower Extremity:  Right  Left  Hip flexors  5-/5   5/5   Hip extensors  5-/5   5/5   Adductor 5/5  5/5  Abductor 5/5  5/5  Knee flexors  5-/5   5/5   Knee extensors  5-/5   5/5   Dorsiflexors  4/5   5/5   Plantarflexors  4/5   5/5   Toe extensors  4/5   5/5   Toe flexors  4/5   5/5   Tone (Ashworth scale)  0  0   MSRs:  Right        Left                  brachioradialis 2+  2+  biceps 2+  2+  triceps 2+  2+  patellar 3+  2+  ankle jerk 2+  2+  Hoffman no  no  plantar response down  down   SENSORY:  Temperature and pin prick reduced over the lateral foot on the right, otherwise sensation intact  throughout.  COORDINATION/GAIT: Normal finger-to- nose-finger.  Intact rapid alternating movements bilaterally. 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   Thank you for allowing me to participate in patient's care.  If I can answer any additional questions, I would be pleased to do so.    Sincerely,    Myreon Wimer K. Posey Pronto, DO

## 2020-02-05 ENCOUNTER — Telehealth: Payer: Self-pay

## 2020-02-05 NOTE — Telephone Encounter (Signed)
-----   Message from Lurene Shadow sent at 02/04/2020 10:29 AM EDT ----- Regarding: RE: mri lumbar This has been approved and updated. Thanks!  ----- Message ----- From: Harold Hedge, CMA Sent: 01/31/2020   1:19 PM EDT To: Lurene Shadow Subject: mri lumbar                                     Hey Chelsea,   Dr Posey Pronto has ordered a lumbar spine with out contrast at [facility]..  Thanks,  Darden Dates

## 2020-02-11 ENCOUNTER — Encounter: Payer: BLUE CROSS/BLUE SHIELD | Attending: Obstetrics & Gynecology | Admitting: Physical Therapy

## 2020-02-11 ENCOUNTER — Encounter: Payer: Self-pay | Admitting: Physical Therapy

## 2020-02-11 ENCOUNTER — Other Ambulatory Visit: Payer: Self-pay

## 2020-02-11 DIAGNOSIS — M6281 Muscle weakness (generalized): Secondary | ICD-10-CM | POA: Diagnosis not present

## 2020-02-11 DIAGNOSIS — R252 Cramp and spasm: Secondary | ICD-10-CM | POA: Diagnosis present

## 2020-02-11 DIAGNOSIS — M25551 Pain in right hip: Secondary | ICD-10-CM | POA: Diagnosis present

## 2020-02-11 DIAGNOSIS — M25651 Stiffness of right hip, not elsewhere classified: Secondary | ICD-10-CM | POA: Diagnosis present

## 2020-02-11 DIAGNOSIS — N941 Unspecified dyspareunia: Secondary | ICD-10-CM | POA: Diagnosis present

## 2020-02-11 NOTE — Therapy (Signed)
[place] at [facility] [address] [place], [place], [address] Phone: [phone]   Fax:  [phone]  Physical Therapy Treatment  Patient Details  Name: Ann Foster MRN: [id number] Date of Birth: [birth year] Referring Provider (PT): Dr. Silas Sacramento   Encounter Date: 02/11/2020   PT End of Session - 02/11/20 0839    Visit Number 8    Date for PT Re-Evaluation 05/05/20    Authorization Type BCBS    Authorization - Visit Number 8    Authorization - Number of Visits 30    PT Start Time 0831    PT Stop Time 0915    PT Time Calculation (min) 44 min    Activity Tolerance Patient tolerated treatment well;No increased pain    Behavior During Therapy WFL for tasks assessed/performed           Past Medical History:  Diagnosis Date  . Ankle fracture 2016   Right  . Aortic atherosclerosis (HCC)    trace calcific atherosclerosis aortic arch per ct neck done 12-22-17  . Bronchitis   . Diabetes mellitus without complication ([place])   . Hypertension   . OA (osteoarthritis) of shoulder    left shoulder, both knees arthritis    Past Surgical History:  Procedure Laterality Date  . NO PAST SURGERIES      There were no vitals filed for this visit.   Subjective Assessment - 02/11/20 0832    Subjective I will get a MRI on 9/26. I am going for physical therapy for the right hip and right lumbar to the back. I am lacking feeling in the right leg.    Patient Stated Goals reduce pain    Currently in Pain? Yes    Pain Score 3     Pain Location Vagina    Pain Orientation Mid    Pain Descriptors / Indicators Burning    Pain Type Acute pain    Pain Onset More than a month ago    Pain Frequency Intermittent    Aggravating Factors  during penile penetration    Pain Relieving Factors no vaginal penetration    Multiple Pain Sites Yes    Pain Score 7    Pain Location Hip    Pain Orientation Right    Pain Descriptors / Indicators  Penetrating    Pain Type Acute pain    Pain Onset More than a month ago    Pain Frequency Intermittent    Aggravating Factors  walking    Pain Relieving Factors medication              [facility] PT Assessment - 02/11/20 0001      Assessment   Medical Diagnosis R10.2 Vaginal pain    Referring Provider (PT) Dr. Silas Sacramento    Onset Date/Surgical Date --   past year   Prior Therapy None      Precautions   Precautions None      Restrictions   Weight Bearing Restrictions No      Home Environment   Living Environment Private residence      Prior Function   Level of Independence Independent    Leisure walk       Cognition   Overall Cognitive Status Within Functional Limits for tasks assessed      AROM   Lumbar Extension decreased by 25%      PROM   Right Hip External Rotation  30    Right Hip Internal Rotation  15    Left Hip External Rotation  25      Strength   Right Hip Flexion 4/5    Right Hip External Rotation  4/5    Right Hip Internal Rotation 4/5    Right Hip ABduction 4-/5    Right Hip ADduction 4-/5    Left Hip ABduction 4/5    Left Hip ADduction 4/5      Palpation   SI assessment  ASIS are equal      Special Tests    Special Tests Hip Special Tests    Hip Special Tests  Saralyn Pilar [facility]) Test      Saralyn Pilar [facility]) Test   Findings Positive    Side Right    Comments pain                      Pelvic Floor Special Questions - 02/11/20 0001    Urinary Leakage No    Fecal incontinence No    Pelvic Floor Internal Exam Patient approves PT to assess pelvic floor and treatment    Exam Type Vaginal    Palpation tightness in the perineal body    Strength good squeeze, good lift, able to hold agaisnt strong resistance             [facility] Adult PT Treatment/Exercise - 02/11/20 0001      Neuro Re-ed    Neuro Re-ed Details  bulging of the pelvic floro with breath and therapist asssisting with elongation of the tissue      Manual Therapy    Manual Therapy Internal Pelvic Floor    Internal Pelvic Floor fasial release of the perineal body, bulbocavernosus, puborectalis,    left sidely                 PT Education - 02/11/20 1044    Education Details bulging of the pelvic floor    Person(s) Educated Patient    Methods Explanation;Demonstration    Comprehension Verbalized understanding;Returned demonstration            PT Short Term Goals - 12/10/19 1220      PT SHORT TERM GOAL #1   Title independent with initial HEP    Time 4    Status Achieved      PT SHORT TERM GOAL #2   Title understand how moisturizers assist in in vaginal health and reduce dryness    Time 4    Period Weeks    Status Achieved      PT SHORT TERM GOAL #3   Title understand what lubricants without glycerin are good for vaginal health    Time 4    Period Weeks    Status Achieved             PT Long Term Goals - 02/11/20 1044      PT LONG TERM GOAL #1   Title independent with advanced  HEP    Baseline still learning    Time 12    Period Weeks    Status On-going      PT LONG TERM GOAL #2   Title pain with intercourse is </= 1/10 due to improved moisture and using appropriate lubricants    Baseline 5/10, 80% better since initial evaluation    Time 12    Period Weeks    Status On-going      PT LONG TERM GOAL #3   Title improve right hip mobility and reduction in pain so she is  able to get into a comfortable position for intercourse    Baseline having a MRI of back on 9/26    Time 12    Period Weeks    Status On-going      PT LONG TERM GOAL #4   Title able to have a vaginal exam due to improved elongation of the pelvic floor muscle and minimal to no pain upon palpation    Baseline started to be able to bulge the pelvic floor    Time 12    Period Weeks    Status On-going                 Plan - 02/11/20 3300    Clinical Impression Statement Patient reports he pain level with penile penetration has decreased to  3/10 compared to 5/10. Patient was able to bulge and elongate the pelvic floor with tactile cues for the first time. Patient has thickness in the perineal body, bulbocavernosus, and puborectalis. Paitent is using the dilator daily. Patient continues to have trouble with the cost of the estrogen cream to place vaginally and is looking into other options with her doctors. Patient is having a MRI of her lumbar spine on 02/23/2020 due to the right leg pain and decreased sensation. Patient right hip external rotation is 30 degrees and  internal rotation is 15 degrees. Her right hip strength is 4/5. Patient will benefit from skilled therapy to improve tissue mobility, reduce pain and improve function.    Personal Factors and Comorbidities Comorbidity 1;Sex;Age    Comorbidities Diabetes    Examination-Activity Limitations Bed Mobility    Examination-Participation Restrictions Interpersonal Relationship;Community Activity    Stability/Clinical Decision Making Evolving/Moderate complexity    Rehab Potential Excellent    PT Duration 12 weeks    PT Treatment/Interventions ADLs/Self Care Home Management;Biofeedback;Cryotherapy;Electrical Stimulation;Iontophoresis 4mg /ml Dexamethasone;Moist Heat;Ultrasound;Gait training;Therapeutic activities;Therapeutic exercise;Manual techniques;Patient/family education;Neuromuscular re-education;Passive range of motion;Dry needling;Spinal Manipulations;Joint Manipulations    PT Next Visit Plan continue to work manual on the pelvic floor, see how it is going with the dilator and which one she is on,    PT Home Exercise Plan Access Code: [email or web address]    Recommended Other Services sent MD renewal 02/11/2020    Consulted and Agree with Plan of Care Patient           Patient will benefit from skilled therapeutic intervention in order to improve the following deficits and impairments:  Decreased activity tolerance, Decreased strength, Increased fascial restricitons, Pain, Decreased  mobility, Increased muscle spasms, Decreased range of motion, Decreased coordination  Visit Diagnosis: Muscle weakness (generalized) - Plan: PT plan of care cert/re-cert  Cramp and spasm - Plan: PT plan of care cert/re-cert  Pain in right hip - Plan: PT plan of care cert/re-cert  Stiffness of right hip, not elsewhere classified - Plan: PT plan of care cert/re-cert  Dyspareunia in female - Plan: PT plan of care cert/re-cert     Problem List Patient Active Problem List   Diagnosis Date Noted  . Impingement syndrome of left shoulder 01/23/2018    Earlie Counts, PT 02/11/20 10:55 AM   [facility] at [facility] [address], [place], [place], [address] Phone: [phone]   Fax:  [phone]  Name: Monserrat Vidaurri MRN: [id number] Date of Birth: 06-17-1965

## 2020-02-12 ENCOUNTER — Encounter: Payer: Self-pay | Admitting: Physical Therapy

## 2020-02-12 ENCOUNTER — Other Ambulatory Visit: Payer: Self-pay

## 2020-02-12 ENCOUNTER — Ambulatory Visit: Payer: BLUE CROSS/BLUE SHIELD | Attending: Podiatry | Admitting: Physical Therapy

## 2020-02-12 DIAGNOSIS — M25551 Pain in right hip: Secondary | ICD-10-CM

## 2020-02-12 DIAGNOSIS — M6281 Muscle weakness (generalized): Secondary | ICD-10-CM | POA: Insufficient documentation

## 2020-02-12 DIAGNOSIS — M25651 Stiffness of right hip, not elsewhere classified: Secondary | ICD-10-CM | POA: Diagnosis present

## 2020-02-12 DIAGNOSIS — R252 Cramp and spasm: Secondary | ICD-10-CM | POA: Diagnosis present

## 2020-02-12 NOTE — Patient Instructions (Signed)
[facility]: Prone 2 x per day , 30 sec to  Prone prop on elbows for 15 sec  Prone TrA 2 x 10

## 2020-02-13 NOTE — Therapy (Signed)
[place], [place], [address] Phone: [phone]   Fax:  [phone]  Physical Therapy Evaluation  Patient Details  Name: Ann Foster MRN: [id number] Date of Birth: 25-Aug-1965 Referring Provider (PT): Dr Posey Pronto, Dr. Jacqualyn Posey    Encounter Date: 02/12/2020   PT End of Session - 02/12/20 0956    Visit Number 9    Date for PT Re-Evaluation 05/05/20    Authorization Type BCBS    Authorization - Visit Number 9   1st eval for LBP   Authorization - Number of Visits 30    PT Start Time 0915    PT Stop Time 1000    PT Time Calculation (min) 45 min    Activity Tolerance Patient tolerated treatment well;Patient limited by pain    Behavior During Therapy [facility] for tasks assessed/performed           Past Medical History:  Diagnosis Date  . Ankle fracture 2016   Right  . Aortic atherosclerosis (HCC)    trace calcific atherosclerosis aortic arch per ct neck done 12-22-17  . Bronchitis   . Diabetes mellitus without complication ([place])   . Hypertension   . OA (osteoarthritis) of shoulder    left shoulder, both knees arthritis    Past Surgical History:  Procedure Laterality Date  . NO PAST SURGERIES      There were no vitals filed for this visit.    Subjective Assessment - 02/12/20 0923    Subjective Patient here for lumbar pain, also has a referral for plantar fasciitis.   Seeing Pelvic specialist as well who has been working with her Rt leg for stretching and dry needling.  The pelvic issues are improving, the back and leg not as much.  She has been going through this pain for 6 mos. She has difficulty walking, negotiating stairs, laying on her Rt side.  She would like to see if PT can help her pain in her Rt leg.    Pertinent History ankle fracture, plantar fasciitis? , pelvic pain , diabetes, HTN    Limitations Sitting;Lifting;Standing;Walking;House hold activities    How long can you sit comfortably?  takes time to stand after sitting    How long can you stand comfortably? not long < 5 min dpeends on the day    How long can you walk comfortably? has to stop after 5-10 min    Diagnostic tests MRI 9/26    Currently in Pain? Yes    Pain Score 8     Pain Location Leg    Pain Orientation Right    Pain Descriptors / Indicators Burning;Throbbing;Tightness    Pain Type Chronic pain    Pain Onset More than a month ago    Pain Frequency Constant    Aggravating Factors  activity, walking, standing    Pain Relieving Factors meds can ease the pain    Effect of Pain on Daily Activities limits her mobility and activity            Objective measurements completed on examination: See above findings.      PT Education - 02/12/20 1344    Education Details POC, PT, HEP, prone to reduce tension on nerves    Person(s) Educated Patient    Methods Explanation;Demonstration;Handout    Comprehension Verbalized understanding;Returned demonstration            PT Short Term Goals - 02/12/20 1346      PT SHORT TERM GOAL #1  Title independent with initial HEP    Status Achieved      PT SHORT TERM GOAL #2   Title understand how moisturizers assist in in vaginal health and reduce dryness    Status Achieved      PT SHORT TERM GOAL #3   Title understand what lubricants without glycerin are good for vaginal health    Status Achieved      PT SHORT TERM GOAL #4   Title Pt will notice centralization of pain in Rt LE with exercises and ADLs    Time 4    Period Weeks    Status New    Target Date 03/11/20             PT Long Term Goals - 02/12/20 1347      PT LONG TERM GOAL #1   Title independent with advanced  HEP    Time 8    Period Weeks    Status New    Target Date 04/08/20      PT LONG TERM GOAL #2   Title pain with intercourse is </= 1/10 due to improved moisture and using appropriate lubricants    Status On-going      PT LONG TERM GOAL #3   Title improve right hip mobility  and reduction in pain so she is able to get into a comfortable position for intercourse    Status On-going      PT LONG TERM GOAL #4   Title able to have a vaginal exam due to improved elongation of the pelvic floor muscle and minimal to no pain upon palpation    Status On-going      PT LONG TERM GOAL #5   Title Pt will be able to walk without a limp most of the time, pain in back, hip < 3/10    Time 8    Period Weeks    Status New    Target Date 04/08/20      Additional Long Term Goals   Additional Long Term Goals Yes      PT LONG TERM GOAL #6   Title Pt will be able to increase Rt LE strength to 4/5 for greater stability with gait, standing    Time 8    Period Weeks    Status New    Target Date 04/08/20      PT LONG TERM GOAL #7   Title Pt will be able to stand and walk for 20 min with no more than min pain in Rt back, hip.    Time 8    Period Weeks    Status New    Target Date 04/08/20                  Plan - 02/12/20 1355    Clinical Impression Statement Patient presents for high complexity of Rt sided low back and Rt LE , Rt foot pain . Her symptoms are consistent with severe radiculopathy, causing abnormal sensation and Rt LE weakness.  She had a pos SLR, improvement in LE symptoms with prone and press ups.  At this time we will focus on treating her back as she finishes PT for Pelvic floor. I believe her foot pain is due to lumbar pathology.  She will benefit from skilled PT services to improve functional mobility and quality of life.    Personal Factors and Comorbidities Sex;Age;Comorbidity 3+    Comorbidities Diabetes, pelvic , OA and ankle fracture    Examination-Activity  Limitations Bed Mobility;Stairs;Stand;Hygiene/Grooming;Caring for Others;Lift;Sleep;Transfers;Squat;Adult nurse;Bathing;Bend;Sit    Examination-Participation Restrictions Interpersonal Relationship;Community Activity    Stability/Clinical Decision Making Unstable/Unpredictable     Clinical Decision Making High    Rehab Potential Good    PT Frequency 2x / week   2 x back, 1x pelvic   PT Duration 8 weeks    PT Treatment/Interventions ADLs/Self Care Home Management;Biofeedback;Cryotherapy;Electrical Stimulation;Iontophoresis 4mg /ml Dexamethasone;Moist Heat;Ultrasound;Gait training;Therapeutic activities;Therapeutic exercise;Manual techniques;Patient/family education;Neuromuscular re-education;Passive range of motion;Dry needling;Spinal Manipulations;Joint Manipulations    PT Next Visit Plan check HEP (prone) , positioning and manual for comfort, begin light core as able, modalities for pain, traction RT LE    PT Home Exercise Plan PELVIC : [email or web address]    Consulted and Agree with Plan of Care Patient           Patient will benefit from skilled therapeutic intervention in order to improve the following deficits and impairments:  Decreased activity tolerance, Decreased strength, Increased fascial restricitons, Pain, Decreased mobility, Increased muscle spasms, Decreased range of motion, Decreased coordination, Obesity, Postural dysfunction, Impaired flexibility, Impaired sensation, Difficulty walking  Visit Diagnosis: Muscle weakness (generalized)  Pain in right hip  Stiffness of right hip, not elsewhere classified  Cramp and spasm     Problem List Patient Active Problem List   Diagnosis Date Noted  . Impingement syndrome of left shoulder 01/23/2018    Chanta Bauers 02/13/2020, 12:01 PM  [facility] [address] [place], [place], [address] Phone: [phone]   Fax:  [phone]  Name: Takara Sermons MRN: [id number] Date of Birth: 1966-03-15   Raeford Razor, PT 02/13/20 12:01 PM Phone: [phone] Fax: [phone]

## 2020-02-17 ENCOUNTER — Telehealth: Payer: Self-pay | Admitting: Neurology

## 2020-02-17 MED ORDER — DIAZEPAM 5 MG PO TABS: ORAL_TABLET | ORAL | 0 refills | Status: DC

## 2020-02-17 NOTE — Telephone Encounter (Signed)
Rx for valium sent. Please inform pt to be sure she has someone to drive her to the appointment. Thanks.

## 2020-02-17 NOTE — Telephone Encounter (Signed)
Can you send in Rx? Thank you

## 2020-02-17 NOTE — Telephone Encounter (Signed)
Can you resend rx.

## 2020-02-17 NOTE — Telephone Encounter (Signed)
Patient has an MRI coming up and is wanting to see if she can get something to help her with being claustrophobic?

## 2020-02-17 NOTE — Telephone Encounter (Signed)
Patient's pharmacy called and said they cannot fill the prescription for diazepam. The patient requests it to be sent to [facility] on [address] instead.

## 2020-02-17 NOTE — Telephone Encounter (Signed)
Patient notified

## 2020-02-18 ENCOUNTER — Encounter: Payer: BLUE CROSS/BLUE SHIELD | Admitting: Physical Therapy

## 2020-02-18 ENCOUNTER — Other Ambulatory Visit: Payer: Self-pay

## 2020-02-18 ENCOUNTER — Encounter: Payer: Self-pay | Admitting: Physical Therapy

## 2020-02-18 DIAGNOSIS — N941 Unspecified dyspareunia: Secondary | ICD-10-CM

## 2020-02-18 DIAGNOSIS — M6281 Muscle weakness (generalized): Secondary | ICD-10-CM

## 2020-02-18 DIAGNOSIS — M25551 Pain in right hip: Secondary | ICD-10-CM

## 2020-02-18 DIAGNOSIS — M25651 Stiffness of right hip, not elsewhere classified: Secondary | ICD-10-CM

## 2020-02-18 DIAGNOSIS — R252 Cramp and spasm: Secondary | ICD-10-CM

## 2020-02-18 MED ORDER — DIAZEPAM 5 MG PO TABS
ORAL_TABLET | ORAL | 0 refills | Status: DC
Start: 2020-02-18 — End: 2021-03-15

## 2020-02-18 NOTE — Telephone Encounter (Signed)
Done

## 2020-02-18 NOTE — Therapy (Signed)
[place] at [facility] [address] [place], [place], [address] Phone: [phone]   Fax:  [phone]  Physical Therapy Treatment  Patient Details  Name: Ann Foster MRN: [id number] Date of Birth: 1965/09/16 Referring Provider (PT): Dr. Silas Sacramento  Progress Note Reporting Period 11/19/2019 to 02/18/2020  See note below for Objective Data and Assessment of Progress/Goals.      Encounter Date: 02/18/2020   PT End of Session - 02/18/20 0927    Visit Number 10    Date for PT Re-Evaluation 05/05/20    Authorization Type BCBS    Authorization - Visit Number 10   9 Pelvic, 1 orhto   Authorization - Number of Visits 30    PT Start Time 0830    PT Stop Time 0920    PT Time Calculation (min) 50 min    Activity Tolerance Patient tolerated treatment well;No increased pain    Behavior During Therapy WFL for tasks assessed/performed           Past Medical History:  Diagnosis Date  . Ankle fracture 2016   Right  . Aortic atherosclerosis (HCC)    trace calcific atherosclerosis aortic arch per ct neck done 12-22-17  . Bronchitis   . Diabetes mellitus without complication ([place])   . Hypertension   . OA (osteoarthritis) of shoulder    left shoulder, both knees arthritis    Past Surgical History:  Procedure Laterality Date  . NO PAST SURGERIES      There were no vitals filed for this visit.   Subjective Assessment - 02/18/20 0832    Subjective I feel better. On the dilator right before the last one. Pain level 5/10. Pain level with intercourse is 4/10. The pain was tolerable. I was not tensing up or jumping. I used the dilatoer prior to secx.    Pertinent History ankle fracture, plantar fasciitis? , pelvic pain , diabetes, HTN    Limitations Sitting;Lifting;Standing;Walking;House hold activities    How long can you sit comfortably? takes time to stand after sitting    How long can you stand comfortably? not long < 5  min dpeends on the day    How long can you walk comfortably? has to stop after 5-10 min    Diagnostic tests MRI 9/26    Patient Stated Goals reduce pain    Currently in Pain? Yes    Pain Score 4     Pain Location Vagina    Pain Orientation Mid    Pain Descriptors / Indicators Dull    Pain Type Chronic pain    Pain Onset More than a month ago    Pain Frequency Intermittent    Aggravating Factors  penile peentration    Pain Relieving Factors no penile penetration    Multiple Pain Sites No              [facility] PT Assessment - 02/18/20 0001      Assessment   Medical Diagnosis R10.2 Vaginal pain    Referring Provider (PT) Dr. Silas Sacramento    Prior Therapy Yes for pelvic , current      Precautions   Precautions None      Restrictions   Weight Bearing Restrictions No      Home Environment   Living Environment Private residence      Prior Function   Level of Independence Independent    Leisure walk       Cognition   Overall Cognitive  Status Within Functional Limits for tasks assessed      Observation/Other Assessments   Focus on Therapeutic Outcomes (FOTO)  49% (foot)       Sensation   Light Touch Impaired by gross assessment    Additional Comments Rt foot neuropathy , numb lateral 2 toes and bottom of foot       Coordination   Gross Motor Movements are Fluid and Coordinated Not tested      Functional Tests   Functional tests Squat;Step up;Step down;Single leg stance      Posture/Postural Control   Posture/Postural Control Postural limitations    Postural Limitations Rounded Shoulders;Forward head;Right pelvic obliquity;Flexed trunk;Weight shift left    Posture Comments R knee flexed       AROM   Lumbar Flexion NT     Lumbar Extension decreased by 25%      PROM   Right Hip External Rotation  30    Right Hip Internal Rotation  15    Left Hip External Rotation  25      Strength   Right Hip Flexion 3/5    Right Hip Extension 3/5    Right Hip ABduction  4-/5    Left Hip Flexion 3+/5    Left Hip Extension 4/5    Left Hip ABduction 4/5    Right Knee Flexion 3+/5    Right Knee Extension 3/5    Left Knee Flexion 4+/5    Left Knee Extension 4+/5    Right Ankle Dorsiflexion 3+/5      Palpation   SI assessment  ASIS are equal      Special Tests    Special Tests Hip Special Tests    Hip Special Tests  Saralyn Pilar (FABER) Test      Straight Leg Raise   Findings Positive    Side  Right    Comment pain < 25 deg, neural tension       Saralyn Pilar (FABER) Test   Findings Positive    Side Right    Comments pain      Transfers   Five time sit to stand comments  35.2 sec                      Pelvic Floor Special Questions - 02/18/20 0001    Number of Pregnancies 3    Number of C-Sections 3    Currently Sexually Active Yes    Is this Painful Yes    History of sexually transmitted disease No    Urinary Leakage No    Fecal incontinence No   no constipation   Pelvic Floor Internal Exam Patient approves PT to assess pelvic floor and treatment    Exam Type Vaginal    Strength good squeeze, good lift, able to hold agaisnt strong resistance             [facility] Adult PT Treatment/Exercise - 02/18/20 0001      Self-Care   Self-Care Other Self-Care Comments    Other Self-Care Comments  dicussed with pateint on progression of dilator, using lubricants with dilator, and continue with cocnut oil for moisturizerr      Neuro Re-ed    Neuro Re-ed Details  bulging of the pelvic floro with breath and therapist asssisting with elongation of the tissue      Manual Therapy   Manual Therapy Internal Pelvic Floor    Internal Pelvic Floor fasial release of the perineal body, bulbocavernosus, levator ani, bulbocavernosus, puborectalis,  PT Short Term Goals - 02/12/20 1346      PT SHORT TERM GOAL #1   Title independent with initial HEP    Status Achieved      PT SHORT TERM GOAL #2   Title understand how  moisturizers assist in in vaginal health and reduce dryness    Status Achieved      PT SHORT TERM GOAL #3   Title understand what lubricants without glycerin are good for vaginal health    Status Achieved      PT SHORT TERM GOAL #4   Title Pt will notice centralization of pain in Rt LE with exercises and ADLs    Time 4    Period Weeks    Status New    Target Date 03/11/20             PT Long Term Goals - 02/18/20 0837      PT LONG TERM GOAL #1   Title independent with advanced  HEP    Baseline still learning    Time 8    Period Weeks    Status On-going      PT LONG TERM GOAL #2   Title pain with intercourse is </= 1/10 due to improved moisture and using appropriate lubricants    Baseline pain level 4/10    Time 12    Period Weeks    Status On-going      PT LONG TERM GOAL #3   Title improve right hip mobility and reduction in pain so she is able to get into a comfortable position for intercourse    Baseline having a MRI of back on 9/26; having physical therapy for her hip at the [address] [facility]    Time 12    Period Weeks    Status On-going      PT LONG TERM GOAL #4   Title able to have a vaginal exam due to improved elongation of the pelvic floor muscle and minimal to no pain upon palpation    Baseline started to be able to bulge the pelvic floor    Time 12    Period Weeks    Status On-going                 Plan - 02/18/20 5409    Clinical Impression Statement Patient is being seen at our other facility for her hip and coming to the [facility] center for her pelvic floor. Paitent will be moving to [facility] in October and would like to transfer her care at the [facility] after the move for orthopedic. Patient is on the second to last vaginal dilator. Pain with interocurse is 4/10 now. She feel the vaginal area has increased moisture due to using the coconut oil. Patient reports no urinary leakage or constipation due to improved pelvic floor  msucle coordination. She continues to have tieghtness in the perineal body, bulbocavernosus, and levator ani. Patient is now able to bulge the pelvic floor to elongate the tissue. Patient will benefit from skilled therapy to improve pelvic floor elongation for penile penetration and working on her left hip pain.    Personal Factors and Comorbidities Sex;Age;Comorbidity 3+    Comorbidities Diabetes, pelvic , OA and ankle fracture    Examination-Activity Limitations Bed Mobility;Stairs;Stand;Hygiene/Grooming;Caring for Others;Lift;Sleep;Transfers;Squat;Adult nurse;Bathing;Bend;Sit    Examination-Participation Restrictions Interpersonal Relationship;Community Activity    Stability/Clinical Decision Making Unstable/Unpredictable    Rehab Potential Good    PT Frequency 1x / week   pelvic  PT Duration 12 weeks    PT Treatment/Interventions ADLs/Self Care Home Management;Biofeedback;Cryotherapy;Electrical Stimulation;Iontophoresis 4mg /ml Dexamethasone;Moist Heat;Ultrasound;Gait training;Therapeutic activities;Therapeutic exercise;Manual techniques;Patient/family education;Neuromuscular re-education;Passive range of motion;Dry needling;Spinal Manipulations;Joint Manipulations    PT Next Visit Plan continue with manual work ot pelvic floor. See patient on 03/11/2020 for pelvic floor    PT Home Exercise Plan PELVIC : [id number]    Consulted and Agree with Plan of Care Patient           Patient will benefit from skilled therapeutic intervention in order to improve the following deficits and impairments:  Decreased activity tolerance, Decreased strength, Increased fascial restricitons, Pain, Decreased mobility, Increased muscle spasms, Decreased range of motion, Decreased coordination, Obesity, Postural dysfunction, Impaired flexibility, Impaired sensation, Difficulty walking  Visit Diagnosis: Muscle weakness (generalized)  Pain in right hip  Stiffness of right hip, not elsewhere  classified  Cramp and spasm  Dyspareunia in female     Problem List Patient Active Problem List   Diagnosis Date Noted  . Impingement syndrome of left shoulder 01/23/2018    Earlie Counts, PT 02/18/20 9:34 AM   [facility] [address], [place], [place], [address] Phone: [phone]   Fax:  [phone]  Name: Ann Foster MRN: [id number] Date of Birth: 1965/09/20

## 2020-02-20 ENCOUNTER — Encounter: Payer: Self-pay | Admitting: Physical Therapy

## 2020-02-20 ENCOUNTER — Ambulatory Visit: Payer: BLUE CROSS/BLUE SHIELD | Admitting: Physical Therapy

## 2020-02-20 ENCOUNTER — Other Ambulatory Visit: Payer: Self-pay

## 2020-02-20 DIAGNOSIS — M6281 Muscle weakness (generalized): Secondary | ICD-10-CM

## 2020-02-20 DIAGNOSIS — R252 Cramp and spasm: Secondary | ICD-10-CM

## 2020-02-20 DIAGNOSIS — M25651 Stiffness of right hip, not elsewhere classified: Secondary | ICD-10-CM

## 2020-02-20 DIAGNOSIS — M25551 Pain in right hip: Secondary | ICD-10-CM

## 2020-02-20 NOTE — Therapy (Signed)
[place], [place], [address] Phone: [phone]   Fax:  [phone]  Physical Therapy Treatment  Patient Details  Name: Ann Foster MRN: [id number] Date of Birth: [birth year] Referring Provider (PT): Dr Posey Pronto, Dr. Jacqualyn Posey    Encounter Date: 02/20/2020   PT End of Session - 02/20/20 0958    Visit Number 11   2 visit for back   Date for PT Re-Evaluation 05/05/20    Authorization Type BCBS    Authorization - Visit Number 11    Authorization - Number of Visits 30    PT Start Time 1000    PT Stop Time 1050    PT Time Calculation (min) 50 min    Activity Tolerance Patient tolerated treatment well;No increased pain    Behavior During Therapy WFL for tasks assessed/performed           Past Medical History:  Diagnosis Date  . Ankle fracture 2016   Right  . Aortic atherosclerosis (HCC)    trace calcific atherosclerosis aortic arch per ct neck done 12-22-17  . Bronchitis   . Diabetes mellitus without complication ([place])   . Hypertension   . OA (osteoarthritis) of shoulder    left shoulder, both knees arthritis    Past Surgical History:  Procedure Laterality Date  . NO PAST SURGERIES      There were no vitals filed for this visit.   Subjective Assessment - 02/20/20 1002    Subjective "I am still getting some numbness in the feet which is worse with lay down. the MD gave me gabapentin which I think is helping."    Currently in Pain? Yes    Pain Score 8    last took tylenol for pain 8 am   Pain Location Leg    Pain Orientation Right    Pain Descriptors / Indicators Aching;Numbness;Burning    Pain Type Chronic pain    Pain Onset More than a month ago    Pain Frequency Constant    Aggravating Factors  sitting for a long period of time ( depends on surface of chair),    Pain Relieving Factors medication, elevating the leg, sleeping with pillow between knees,              [facility] PT  Assessment - 02/20/20 0001      Assessment   Medical Diagnosis lumbar spondylosis and R foot     Referring Provider (PT) Dr Posey Pronto, Dr. Jacqualyn Posey       Special Tests    Special Tests Leg LengthTest    Leg length test  True;Apparent      True   Right 95 in.    Left  93.5 in.      Apparent   Right 97 in.    Left 96.5 in.                         [place] Adult PT Treatment/Exercise - 02/20/20 0001      Self-Care   Other Self-Care Comments  benefits of heel lift in the LLE to promote pelvic equality      Lumbar Exercises: Stretches   Active Hamstring Stretch 4 reps;30 seconds;Right   contract/ relax      Knee/Hip Exercises: Standing   Gait Training heel strike/ toe off 4 x 30 ft   with heel lift in the L shoe     Knee/Hip Exercises: Supine   Other Supine Knee/Hip  Exercises clam shell 2 x 10 with green theraband      Manual Therapy   Manual therapy comments MTPR with graded pressure for glute med x 2    Joint Mobilization LAD RLE grade IV                  PT Education - 02/20/20 1052    Education Details Reviewed assessment findings regarding LLD. began chronic pain education as well as benefits with education and graded exposure. reviewed FOTO assessment.    Person(s) Educated Patient    Methods Explanation;Verbal cues    Comprehension Verbalized understanding            PT Short Term Goals - 02/12/20 1346      PT SHORT TERM GOAL #1   Title independent with initial HEP    Status Achieved      PT SHORT TERM GOAL #2   Title understand how moisturizers assist in in vaginal health and reduce dryness    Status Achieved      PT SHORT TERM GOAL #3   Title understand what lubricants without glycerin are good for vaginal health    Status Achieved      PT SHORT TERM GOAL #4   Title Pt will notice centralization of pain in Rt LE with exercises and ADLs    Time 4    Period Weeks    Status New    Target Date 03/11/20             PT Long Term  Goals - 02/18/20 0837      PT LONG TERM GOAL #1   Title independent with advanced  HEP    Baseline still learning    Time 8    Period Weeks    Status On-going      PT LONG TERM GOAL #2   Title pain with intercourse is </= 1/10 due to improved moisture and using appropriate lubricants    Baseline pain level 4/10    Time 12    Period Weeks    Status On-going      PT LONG TERM GOAL #3   Title improve right hip mobility and reduction in pain so she is able to get into a comfortable position for intercourse    Baseline having a MRI of back on 9/26; having physical therapy for her hip at the [address] facility    Time 12    Period Weeks    Status On-going      PT LONG TERM GOAL #4   Title able to have a vaginal exam due to improved elongation of the pelvic floor muscle and minimal to no pain upon palpation    Baseline started to be able to bulge the pelvic floor    Time 12    Period Weeks    Status On-going                 Plan - 02/20/20 1053    Clinical Impression Statement pt reports consistency with her HEP for her back and continues to report decreased referred symptoms with prone extension. Further assessment revealed RLE true LLD with RLE being longer, provided heel lift for the L shoe which she noted relief of pain to 6/10. utilized graded ischemic compression for the R glute med due to pain followed with hip abductor strengthening.  opted to hold off on TPDN to assess response to the heel lift next session.    PT Treatment/Interventions ADLs/Self Care  Home Management;Biofeedback;Cryotherapy;Electrical Stimulation;Iontophoresis 4mg /ml Dexamethasone;Moist Heat;Ultrasound;Gait training;Therapeutic activities;Therapeutic exercise;Manual techniques;Patient/family education;Neuromuscular re-education;Passive range of motion;Dry needling;Spinal Manipulations;Joint Manipulations    PT Next Visit Plan for lumbar/ RLE: assess responsed to heel lift in the L shoe, graded  strengthening for hip abductor/ core strengthening, continue gait training, STW for glute med/ min, continue trunk extension progression as she continues to benefit    Consulted and Agree with Plan of Care Patient           Patient will benefit from skilled therapeutic intervention in order to improve the following deficits and impairments:  Decreased activity tolerance, Decreased strength, Increased fascial restricitons, Pain, Decreased mobility, Increased muscle spasms, Decreased range of motion, Decreased coordination, Obesity, Postural dysfunction, Impaired flexibility, Impaired sensation, Difficulty walking  Visit Diagnosis: Muscle weakness (generalized)  Pain in right hip  Stiffness of right hip, not elsewhere classified  Cramp and spasm     Problem List Patient Active Problem List   Diagnosis Date Noted  . Impingement syndrome of left shoulder 01/23/2018    Starr Lake PT, DPT, LAT, ATC  02/20/20  11:02 AM      [place], [place], [address] Phone: [phone]   Fax:  [phone]  Name: Ann Foster MRN: [id number] Date of Birth: 1965-07-14

## 2020-02-23 ENCOUNTER — Ambulatory Visit
Admission: RE | Admit: 2020-02-23 | Discharge: 2020-02-23 | Disposition: A | Discharge status: Home / Self Care | Payer: BLUE CROSS/BLUE SHIELD | Source: Ambulatory Visit | Attending: Neurology | Admitting: Neurology

## 2020-02-24 ENCOUNTER — Other Ambulatory Visit: Payer: Self-pay

## 2020-02-24 ENCOUNTER — Ambulatory Visit: Payer: BLUE CROSS/BLUE SHIELD

## 2020-02-24 DIAGNOSIS — M6281 Muscle weakness (generalized): Secondary | ICD-10-CM | POA: Diagnosis not present

## 2020-02-24 DIAGNOSIS — M25551 Pain in right hip: Secondary | ICD-10-CM

## 2020-02-24 DIAGNOSIS — M25651 Stiffness of right hip, not elsewhere classified: Secondary | ICD-10-CM

## 2020-02-24 NOTE — Therapy (Signed)
[place], [place], [address] Phone: [phone]   Fax:  [phone]  Physical Therapy Treatment  Patient Details  Name: Ann Foster MRN: [id number] Date of Birth: 09-07-1965 Referring Provider (PT): Dr Posey Pronto, Dr. Jacqualyn Posey    Encounter Date: 02/24/2020   PT End of Session - 02/24/20 1134    Visit Number 12   3 visits for back   [place] - Visit Number 12    Authorization - Number of Visits 30    PT Start Time 5427    PT Stop Time 1230    PT Time Calculation (min) 55 min    Activity Tolerance Patient tolerated treatment well;Patient limited by pain;No increased pain    Behavior During Therapy [facility] for tasks assessed/performed           Past Medical History:  Diagnosis Date   Ankle fracture 2016   Right   Aortic atherosclerosis (HCC)    trace calcific atherosclerosis aortic arch per ct neck done 12-22-17   Bronchitis    Diabetes mellitus without complication (HCC)    Hypertension    OA (osteoarthritis) of shoulder    left shoulder, both knees arthritis    Past Surgical History:  Procedure Laterality Date   NO PAST SURGERIES      There were no vitals filed for this visit.   Subjective Assessment - 02/24/20 1136    Subjective She reports no change .   She has hip pain RT. Pain in hip for  6 months or more    Pain Location Hip    Pain Orientation Right    Pain Descriptors / Indicators Aching;Numbness;Burning    Pain Type Chronic pain    Pain Onset More than a month ago    Pain Frequency Constant    Aggravating Factors  sitting    Pain Relieving Factors meds                             [facility] Adult PT Treatment/Exercise - 02/24/20 0001      Lumbar Exercises: Stretches   Single Knee to Chest Stretch Left;Right;20 seconds;2 reps    Lower Trunk Rotation Limitations 15 reps RT/LT     Pelvic Tilt 10 reps    Pelvic Tilt  Limitations needed manual and tactile cuing    Prone on Elbows Stretch 3 reps;30 seconds    Figure 4 Stretch 2 reps;30 seconds      Knee/Hip Exercises: Supine   Bridges 15 reps    Other Supine Knee/Hip Exercises PPT with clam red band x 20      Modalities   Modalities Electrical Stimulation;Moist Heat      Moist Heat Therapy   Number Minutes Moist Heat 15 Minutes    Moist Heat Location Hip   and back RT     Electrical Stimulation   Electrical Stimulation Location back and RT hip    Electrical Stimulation Action IFC    Electrical Stimulation Parameters to tolerance    Electrical Stimulation Goals Pain                    PT Short Term Goals - 02/12/20 1346      PT SHORT TERM GOAL #1   Title independent with initial HEP    Status Achieved      PT SHORT TERM GOAL #2   Title understand how moisturizers  assist in in vaginal health and reduce dryness    Status Achieved      PT SHORT TERM GOAL #3   Title understand what lubricants without glycerin are good for vaginal health    Status Achieved      PT SHORT TERM GOAL #4   Title Pt will notice centralization of pain in Rt LE with exercises and ADLs    Time 4    Period Weeks    Status New    Target Date 03/11/20             PT Long Term Goals - 02/18/20 0837      PT LONG TERM GOAL #1   Title independent with advanced  HEP    Baseline still learning    Time 8    Period Weeks    Status On-going      PT LONG TERM GOAL #2   Title pain with intercourse is </= 1/10 due to improved moisture and using appropriate lubricants    Baseline pain level 4/10    Time 12    Period Weeks    Status On-going      PT LONG TERM GOAL #3   Title improve right hip mobility and reduction in pain so she is able to get into a comfortable position for intercourse    Baseline having a MRI of back on 9/26; having physical therapy for her hip at the [address] [facility] facility    Time 12    Period Weeks    Status On-going      PT  LONG TERM GOAL #4   Title able to have a vaginal exam due to improved elongation of the pelvic floor muscle and minimal to no pain upon palpation    Baseline started to be able to bulge the pelvic floor    Time 12    Period Weeks    Status On-going                 Plan - 02/24/20 1140    Clinical Impression Statement No changes so far  and MRI results appear she has som significant degenerative changes possibly causing her pain. Will continue with exercise , manual and modalities to see if pain  can be eased.    Examination-Activity Limitations Bed Mobility;Stairs;Stand;Hygiene/Grooming;Caring for Others;Lift;Sleep;Transfers;Squat;Adult nurse;Bathing;Bend;Sit    PT Treatment/Interventions ADLs/Self Care Home Management;Biofeedback;Cryotherapy;Electrical Stimulation;Iontophoresis 4mg /ml Dexamethasone;Moist Heat;Ultrasound;Gait training;Therapeutic activities;Therapeutic exercise;Manual techniques;Patient/family education;Neuromuscular re-education;Passive range of motion;Dry needling;Spinal Manipulations;Joint Manipulations    PT Next Visit Plan for lumbar/ RLE: assess responsed to heel lift in the L shoe, graded strengthening for hip abductor/ core strengthening, continue gait training, STW for glute med/ min, continue trunk extension progression as she continues to benefit    PT Home Exercise Plan PELVIC : [id number]    Consulted and Agree with Plan of Care Patient           Patient will benefit from skilled therapeutic intervention in order to improve the following deficits and impairments:  Decreased activity tolerance, Decreased strength, Increased fascial restricitons, Pain, Decreased mobility, Increased muscle spasms, Decreased range of motion, Decreased coordination, Obesity, Postural dysfunction, Impaired flexibility, Impaired sensation, Difficulty walking  Visit Diagnosis: Muscle weakness (generalized)  Pain in right hip  Stiffness of right hip, not elsewhere  classified     Problem List Patient Active Problem List   Diagnosis Date Noted   Impingement syndrome of left shoulder 01/23/2018    Darrel Hoover  PT 02/24/2020, 12:16 PM  [place], [place], [address] Phone: [phone]   Fax:  [phone]  Name: Khilee Hendricksen MRN: [id number] Date of Birth: 1965/06/10

## 2020-02-25 ENCOUNTER — Encounter: Payer: No Typology Code available for payment source | Admitting: Physical Therapy

## 2020-02-25 ENCOUNTER — Ambulatory Visit (INDEPENDENT_AMBULATORY_CARE_PROVIDER_SITE_OTHER): Payer: BLUE CROSS/BLUE SHIELD | Admitting: Podiatry

## 2020-02-25 DIAGNOSIS — G629 Polyneuropathy, unspecified: Secondary | ICD-10-CM

## 2020-02-25 DIAGNOSIS — M722 Plantar fascial fibromatosis: Secondary | ICD-10-CM | POA: Diagnosis not present

## 2020-02-25 DIAGNOSIS — E1149 Type 2 diabetes mellitus with other diabetic neurological complication: Secondary | ICD-10-CM | POA: Diagnosis not present

## 2020-02-25 DIAGNOSIS — M79673 Pain in unspecified foot: Secondary | ICD-10-CM

## 2020-02-25 DIAGNOSIS — G8929 Other chronic pain: Secondary | ICD-10-CM

## 2020-02-26 ENCOUNTER — Ambulatory Visit: Payer: BLUE CROSS/BLUE SHIELD | Admitting: Physical Therapy

## 2020-02-26 ENCOUNTER — Other Ambulatory Visit: Payer: Self-pay

## 2020-02-26 DIAGNOSIS — M6281 Muscle weakness (generalized): Secondary | ICD-10-CM | POA: Diagnosis not present

## 2020-02-26 DIAGNOSIS — R252 Cramp and spasm: Secondary | ICD-10-CM

## 2020-02-26 DIAGNOSIS — M25551 Pain in right hip: Secondary | ICD-10-CM

## 2020-02-26 DIAGNOSIS — M25651 Stiffness of right hip, not elsewhere classified: Secondary | ICD-10-CM

## 2020-02-26 NOTE — Therapy (Signed)
[place], [place], [address] Phone: [phone]   Fax:  [phone]  Physical Therapy Treatment  Patient Details  Name: Ann Foster MRN: [id number] Date of Birth: Nov 12, 1965 Referring Provider (PT): Dr Posey Pronto, Dr. Jacqualyn Posey    Encounter Date: 02/26/2020   PT End of Session - 02/26/20 0838    Visit Number 13   4 for back   Date for PT Re-Evaluation 05/05/20    Authorization Type BCBS    Authorization - Visit Number 13    Authorization - Number of Visits 30    PT Start Time 0830    PT Stop Time 0928    PT Time Calculation (min) 58 min    Activity Tolerance Patient tolerated treatment well;Patient limited by pain    Behavior During Therapy [facility] for tasks assessed/performed           Past Medical History:  Diagnosis Date  . Ankle fracture 2016   Right  . Aortic atherosclerosis (HCC)    trace calcific atherosclerosis aortic arch per ct neck done 12-22-17  . Bronchitis   . Diabetes mellitus without complication ([place])   . Hypertension   . OA (osteoarthritis) of shoulder    left shoulder, both knees arthritis    Past Surgical History:  Procedure Laterality Date  . NO PAST SURGERIES      There were no vitals filed for this visit.   Subjective Assessment - 02/26/20 0836    Subjective Dr Posey Pronto wants me to do PT for awhile before I see the Neurosurgeon. I dont want injections.    Diagnostic tests MRI 9/26    Currently in Pain? Yes    Pain Score 9     Pain Location Back    Pain Orientation Right    Pain Descriptors / Indicators Aching;Spasm    Pain Type Chronic pain    Pain Radiating Towards Rt LE to foot    Pain Onset More than a month ago    Pain Frequency Constant    Aggravating Factors  sitting 30 min or more    Pain Relieving Factors meds, heat/ice             [facility] Adult PT Treatment/Exercise - 02/26/20 0001      Self-Care   Other Self-Care Comments  transfer to [employer]?  HEP, IFC       Lumbar Exercises: Stretches   Lower Trunk Rotation Limitations 15 reps RT/LT legs on ball       Lumbar Exercises: Standing   Other Standing Lumbar Exercises standing core press physio ball x 15     Other Standing Lumbar Exercises gentle rotation trunk x 60 sec       Lumbar Exercises: Seated   Other Seated Lumbar Exercises core activation x 5       Lumbar Exercises: Supine   Heel Slides Limitations legs on ball x 10       Lumbar Exercises: Prone   Other Prone Lumbar Exercises prone on elbows prop x 15 sec x 10     Other Prone Lumbar Exercises Tr A  x 10 and then knee flexion with core active x 10 each       Moist Heat Therapy   Number Minutes Moist Heat 10 Minutes    Moist Heat Location Lumbar Spine      Electrical Stimulation   Electrical Stimulation Location back and RT hip    Electrical Stimulation Action IFC  Electrical Stimulation Parameters 17     Electrical Stimulation Goals Pain                    PT Short Term Goals - 02/12/20 1346      PT SHORT TERM GOAL #1   Title independent with initial HEP    Status Achieved      PT SHORT TERM GOAL #2   Title understand how moisturizers assist in in vaginal health and reduce dryness    Status Achieved      PT SHORT TERM GOAL #3   Title understand what lubricants without glycerin are good for vaginal health    Status Achieved      PT SHORT TERM GOAL #4   Title Pt will notice centralization of pain in Rt LE with exercises and ADLs    Time 4    Period Weeks    Status New    Target Date 03/11/20             PT Long Term Goals - 02/18/20 0837      PT LONG TERM GOAL #1   Title independent with advanced  HEP    Baseline still learning    Time 8    Period Weeks    Status On-going      PT LONG TERM GOAL #2   Title pain with intercourse is </= 1/10 due to improved moisture and using appropriate lubricants    Baseline pain level 4/10    Time 12    Period Weeks    Status On-going        PT LONG TERM GOAL #3   Title improve right hip mobility and reduction in pain so she is able to get into a comfortable position for intercourse    Baseline having a MRI of back on 9/26; having physical therapy for her hip [facility] [address] [facility]    Time 12    Period Weeks    Status On-going      PT LONG TERM GOAL #4   Title able to have a vaginal exam due to improved elongation of the pelvic floor muscle and minimal to no pain upon palpation    Baseline started to be able to bulge the pelvic floor    Time 12    Period Weeks    Status On-going                 Plan - 02/26/20 2353    Clinical Impression Statement Patient with continued pain, seems improved with prone extension and decompression to Rt trunk.  Activation of core emphasized with gentle movement and repeat of IFC and heat for pain control.    PT Treatment/Interventions ADLs/Self Care Home Management;Biofeedback;Cryotherapy;Electrical Stimulation;Iontophoresis 4mg /ml Dexamethasone;Moist Heat;Ultrasound;Gait training;Therapeutic activities;Therapeutic exercise;Manual techniques;Patient/family education;Neuromuscular re-education;Passive range of motion;Dry needling;Spinal Manipulations;Joint Manipulations    PT Next Visit Plan for lumbar/ RLE graded strengthening for hip abductor/ core strengthening, continue gait training, STW for glute med/ min, continue trunk extension progression as she continues to benefit    PT Home Exercise Plan PELVIC : [id number].Marland Kitchen BACK: Prone prop, press up and Tr A, sidelying Rt trunk    Consulted and Agree with Plan of Care Patient           Patient will benefit from skilled therapeutic intervention in order to improve the following deficits and impairments:  Decreased activity tolerance, Decreased strength, Increased fascial restricitons, Pain, Decreased mobility, Increased muscle spasms, Decreased range of motion, Decreased  coordination, Obesity, Postural dysfunction, Impaired  flexibility, Impaired sensation, Difficulty walking  Visit Diagnosis: Muscle weakness (generalized)  Pain in right hip  Stiffness of right hip, not elsewhere classified  Cramp and spasm     Problem List Patient Active Problem List   Diagnosis Date Noted  . Impingement syndrome of left shoulder 01/23/2018    Ann Foster 02/26/2020, 7:51 PM  [facility] [address] [place], [place], [address] Phone: [phone]   Fax:  [phone]  Name: Ann Foster MRN: [id number] Date of Birth: March 19, 1966  Raeford Razor, PT 02/26/20 7:51 PM Phone: [phone] Fax: [phone]

## 2020-02-26 NOTE — Progress Notes (Signed)
Subjective: 54 year old female presents the office for follow-up evaluation of pain as well as right foot pain.  She did follow-up with Dr. Posey Pronto and she had an MRI of her lumbar spine on Sunday.  She has not yet been told the results.  She has still been doing physical therapy without significant improvement.  She does get some occasional pain in the right foot but the majority of her issues is the right radiating pain down her leg. Denies any systemic complaints such as fevers, chills, nausea, vomiting. No acute changes since last appointment, and no other complaints at this time.   Objective: AAO x3, NAD DP/PT pulses palpable bilaterally, CRT less than 3 seconds There is mild tenderness along the plantar fascia on the right foot as well as submetatarsal area.  No specific area of pinpoint tenderness.  No significant edema, erythema.  Radiating pain down the right leg.  No pain with calf compression, swelling, warmth, erythema  Assessment: Right foot plantar fasciitis/metatarsalgia; possible lumbar nerve entrapment  Plan: -All treatment options discussed with the patient including all alternatives, risks, complications.  -She has not yet followed with Dr. Posey Pronto.  Patient has called her to get the results of the MRI.  I did briefly discuss the results with her but discussed I am not a back doctor or neurologist and she needs to follow-up with Dr. Posey Pronto for this.  For now continue physical therapy. -We will do physical therapy for the right foot pain as well.  Consider steroid injection if needed.  Continue with supportive shoes, inserts. -Patient encouraged to call the office with any questions, concerns, change in symptoms.   Trula Slade DPM

## 2020-03-02 ENCOUNTER — Ambulatory Visit: Payer: BLUE CROSS/BLUE SHIELD | Admitting: Physical Therapy

## 2020-03-03 ENCOUNTER — Telehealth: Payer: Self-pay | Admitting: Nurse Practitioner

## 2020-03-03 ENCOUNTER — Encounter: Payer: No Typology Code available for payment source | Admitting: Physical Therapy

## 2020-03-03 NOTE — Telephone Encounter (Signed)
Copied from [place] [phone]. Topic: General - Call Back - No Documentation >> Mar 02, 2020  3:48 PM Erick Blinks wrote: Reason for CRM: Shirlee Limerick calling from [place] wants to be called by office regarding fax submission.   Best contact: [phone]

## 2020-03-05 ENCOUNTER — Ambulatory Visit: Payer: BLUE CROSS/BLUE SHIELD | Admitting: Physical Therapy

## 2020-03-06 NOTE — Telephone Encounter (Signed)
Please f/u

## 2020-03-06 NOTE — Telephone Encounter (Signed)
Caller name: Jeneen Rinks  Relation to pt: [employer]  Call back number: [phone] option 2    Reason for call:  Checking on the status if [employer] Physical Evaluation Form was received. Reference # [id number] located on the bottom of page.  Re faxing to [phone] please note when received.

## 2020-03-09 ENCOUNTER — Ambulatory Visit: Payer: BLUE CROSS/BLUE SHIELD | Attending: Podiatry | Admitting: Physical Therapy

## 2020-03-09 ENCOUNTER — Encounter: Payer: Self-pay | Admitting: Physical Therapy

## 2020-03-09 ENCOUNTER — Other Ambulatory Visit: Payer: Self-pay

## 2020-03-09 ENCOUNTER — Encounter: Payer: No Typology Code available for payment source | Admitting: Physical Therapy

## 2020-03-09 DIAGNOSIS — R252 Cramp and spasm: Secondary | ICD-10-CM | POA: Diagnosis present

## 2020-03-09 DIAGNOSIS — M25651 Stiffness of right hip, not elsewhere classified: Secondary | ICD-10-CM | POA: Diagnosis present

## 2020-03-09 DIAGNOSIS — M25551 Pain in right hip: Secondary | ICD-10-CM | POA: Diagnosis present

## 2020-03-09 DIAGNOSIS — M6281 Muscle weakness (generalized): Secondary | ICD-10-CM

## 2020-03-09 NOTE — Telephone Encounter (Signed)
Spoke to [place] from [employer] and informed that we received the form.

## 2020-03-09 NOTE — Therapy (Signed)
[place] [address]  [place], [place], [address] Phone: [phone]   Fax:  [phone]  Physical Therapy Treatment  Patient Details  Name: Ann Foster MRN: [id number] Date of Birth: 03-25-1966 Referring Provider (PT): Dr Posey Pronto, Dr. Jacqualyn Posey    Encounter Date: 03/09/2020   PT End of Session - 03/09/20 1446    Visit Number 14   5th visit for back   Date for PT Re-Evaluation 05/05/20    Authorization Type BCBS    Authorization - Visit Number 14    Authorization - Number of Visits 30    PT Start Time 7948    PT Stop Time 1540    PT Time Calculation (min) 54 min    Activity Tolerance Patient tolerated treatment well    Behavior During Therapy [facility] for tasks assessed/performed           Past Medical History:  Diagnosis Date  . Ankle fracture 2016   Right  . Aortic atherosclerosis (HCC)    trace calcific atherosclerosis aortic arch per ct neck done 12-22-17  . Bronchitis   . Diabetes mellitus without complication ([place])   . Hypertension   . OA (osteoarthritis) of shoulder    left shoulder, both knees arthritis    Past Surgical History:  Procedure Laterality Date  . NO PAST SURGERIES      There were no vitals filed for this visit.   Subjective Assessment - 03/09/20 1450    Subjective Pt reports she continues to be limited with her sitting tolerance with worsening of radicular pain into her R leg. Notes increased sensitivty with increased swelling in her R leg with inclement weather for past week.    Pertinent History h/o R ankle fracture, plantar fasciitis?, pelvic pain , diabetes, HTN    Diagnostic tests MRI 9/26    Currently in Pain? Yes    Pain Score 8     Pain Location Back    Pain Orientation Lower;Right;Left   R>L   Pain Type Chronic pain    Pain Frequency Constant                             [facility] Adult PT Treatment/Exercise - 03/09/20 1446      Self-Care    Self-Care Posture    Posture Provided education in proper posture and bdy mechanics for typical daily tasks to reduce LBP strain/pain with normal daily tasks - handout provided.      Exercises   Exercises Lumbar      Lumbar Exercises: Stretches   Active Hamstring Stretch Right;30 seconds;1 rep    Active Hamstring Stretch Limitations seated hip hinge    Single Knee to Chest Stretch Right;Left;30 seconds;1 rep    Hip Flexor Stretch Right;30 seconds;2 reps    Hip Flexor Stretch Limitations seated lunge position over edge of chair    Piriformis Stretch Right;30 seconds;2 reps    Piriformis Stretch Limitations supine KTOS with opp LE straight      Lumbar Exercises: Aerobic   Nustep L3 x 6 min (UE/LE)      Manual Therapy   Manual Therapy Soft tissue mobilization;Myofascial release    Soft tissue mobilization STM/DTM to R iliacus, iliopsoas, proximal quads, TLF and lateral glutes/piriformis    Myofascial Release manual TPR to R iliacus, proximal quads and lateral glutes  PT Education - 03/09/20 1557    Education Details HEP update - supine KTOS glute/piriformis stretch, seated lunge position hip flexor stretch; Posture and body mechanics education for typical daily tasks.    Person(s) Educated Patient    Methods Explanation;Demonstration;Verbal cues;Tactile cues;Handout    Comprehension Verbalized understanding;Verbal cues required;Tactile cues required;Returned demonstration;Need further instruction            PT Short Term Goals - 02/12/20 1346      PT SHORT TERM GOAL #1   Title independent with initial HEP    Status Achieved      PT SHORT TERM GOAL #2   Title understand how moisturizers assist in in vaginal health and reduce dryness    Status Achieved      PT SHORT TERM GOAL #3   Title understand what lubricants without glycerin are good for vaginal health    Status Achieved      PT SHORT TERM GOAL #4   Title Pt will notice centralization of pain  in Rt LE with exercises and ADLs    Time 4    Period Weeks    Status New    Target Date 03/11/20             PT Long Term Goals - 02/18/20 0837      PT LONG TERM GOAL #1   Title independent with advanced  HEP    Baseline still learning    Time 8    Period Weeks    Status On-going      PT LONG TERM GOAL #2   Title pain with intercourse is </= 1/10 due to improved moisture and using appropriate lubricants    Baseline pain level 4/10    Time 12    Period Weeks    Status On-going      PT LONG TERM GOAL #3   Title improve right hip mobility and reduction in pain so she is able to get into a comfortable position for intercourse    Baseline having a MRI of back on 9/26; having physical therapy for her hip [facility]    Time 12    Period Weeks    Status On-going      PT LONG TERM GOAL #4   Title able to have a vaginal exam due to improved elongation of the pelvic floor muscle and minimal to no pain upon palpation    Baseline started to be able to bulge the pelvic floor    Time 12    Period Weeks    Status On-going                 Plan - 03/09/20 1540    Clinical Impression Statement Brigitte reports her pain has been worse for the past week due to the inclement weather with radicular pain and cramping in anterior R hip more notable today. Pt very ttp over R iliacus, iliopsoas, proximal quads, TLF and lateral glutes/piriformis - able to elicit strong twitch response with manual TPR over R iliacus along with palpable reduction in ttp in remaining muscles with pt noting improvement in pain following this. Reviewed stretches to promote further muscle relaxation with updated HEP provided. Session concluded with training in proper posture and body mechanics for typical daily tasks to promote reduced low back strain/pain.    Comorbidities Diabetes, pelvic , OA and ankle fracture    PT Frequency 2x / week   LBP   PT Duration 8 weeks  PT  Treatment/Interventions ADLs/Self Care Home Management;Biofeedback;Cryotherapy;Electrical Stimulation;Iontophoresis 4mg /ml Dexamethasone;Moist Heat;Ultrasound;Gait training;Therapeutic activities;Therapeutic exercise;Manual techniques;Patient/family education;Neuromuscular re-education;Passive range of motion;Dry needling;Spinal Manipulations;Joint Manipulations    PT Next Visit Plan for lumbar/ RLE graded strengthening for hip abductor/ core strengthening, continue gait training, STW for glute med/ min, continue trunk extension progression as she continues to benefit    PT Home Exercise Plan PELVIC : [id number].Marland Kitchen BACK: Prone prop, press up and Tr A, sidelying Rt trunk; 10/11 (BACK) - supine KTOS, seated hip flexor stretch    Consulted and Agree with Plan of Care Patient           Patient will benefit from skilled therapeutic intervention in order to improve the following deficits and impairments:  Decreased activity tolerance, Decreased strength, Increased fascial restricitons, Pain, Decreased mobility, Increased muscle spasms, Decreased range of motion, Decreased coordination, Obesity, Postural dysfunction, Impaired flexibility, Impaired sensation, Difficulty walking  Visit Diagnosis: Muscle weakness (generalized)  Pain in right hip  Stiffness of right hip, not elsewhere classified  Cramp and spasm     Problem List Patient Active Problem List   Diagnosis Date Noted  . Impingement syndrome of left shoulder 01/23/2018    Percival Spanish, PT, MPT 03/09/2020, 4:09 PM  [facility] [address]  [place], [place], [address] Phone: [phone]   Fax:  [phone]  Name: Avonell Lenig MRN: [id number] Date of Birth: May 09, 1966

## 2020-03-09 NOTE — Patient Instructions (Addendum)
  Home exercise program created by Rondi Ivy, PT.  For questions, please contact Jernard Reiber via phone at [phone] or email at [email or web address]  [facility] [place] [address]  [address] [place], [place], [address] Phone: [phone]   Fax:  [phone]     Sleeping on Back  Place pillow under knees. A pillow with cervical support and a roll around waist are also helpful. Copyright  VHI. All rights reserved.  Sleeping on Side Place pillow between knees. Use cervical support under neck and a roll around waist as needed. Copyright  VHI. All rights reserved.   Sleeping on Stomach   If this is the only desirable sleeping position, place pillow under lower legs, and under stomach or chest as needed.  Posture - Sitting   Sit upright, head facing forward. Try using a roll to support lower back. Keep shoulders relaxed, and avoid rounded back. Keep hips level with knees. Avoid crossing legs for long periods. Stand to Sit / Sit to Stand   To sit: Bend knees to lower self onto front edge of chair, then scoot back on seat. To stand: Reverse sequence by placing one foot forward, and scoot to front of seat. Use rocking motion to stand up.   Work Height and Reach  Ideal work height is no more than 2 to 4 inches below elbow level when standing, and at elbow level when sitting. Reaching should be limited to arm's length, with elbows slightly bent.  Bending  Bend at hips and knees, not back. Keep feet shoulder-width apart.    Posture - Standing   Good posture is important. Avoid slouching and forward head thrust. Maintain curve in low back and align ears over shoul- ders, hips over ankles.  Alternating Positions   Alternate tasks and change positions frequently to reduce fatigue and muscle tension. Take rest breaks. Computer Work   Position work to face forward. Use proper work and seat height. Keep shoulders back  and down, wrists straight, and elbows at right angles. Use chair that provides full back support. Add footrest and lumbar roll as needed.  Getting Into / Out of Car  Lower self onto seat, scoot back, then bring in one leg at a time. Reverse sequence to get out.  Dressing  Lie on back to pull socks or slacks over feet, or sit and bend leg while keeping back straight.    Housework - Sink  Place one foot on ledge of cabinet under sink when standing at sink for prolonged periods.   Pushing / Pulling  Pushing is preferable to pulling. Keep back in proper alignment, and use leg muscles to do the work.  Deep Squat   Squat and lift with both arms held against upper trunk. Tighten stomach muscles without holding breath. Use smooth movements to avoid jerking.  Avoid Twisting   Avoid twisting or bending back. Pivot around using foot movements, and bend at knees if needed when reaching for articles.  Carrying Luggage   Distribute weight evenly on both sides. Use a cart whenever possible. Do not twist trunk. Move body as a unit.   Lifting Principles .Maintain proper posture and head alignment. .Slide object as close as possible before lifting. .Move obstacles out of the way. .Test before lifting; ask for help if too heavy. .Tighten stomach muscles without holding breath. .Use smooth movements; do not jerk. .Use legs to do the work, and pivot with feet. .Distribute the work load symmetrically and   close to the center of trunk. .Push instead of pull whenever possible.   Ask For Help   Ask for help and delegate to others when possible. Coordinate your movements when lifting together, and maintain the low back curve.  Log Roll   Lying on back, bend left knee and place left arm across chest. Roll all in one movement to the right. Reverse to roll to the left. Always move as one unit. Housework - Sweeping  Use long-handled equipment to avoid stooping.   Housework -  Wiping  Position yourself as close as possible to reach work surface. Avoid straining your back.  Laundry - Unloading Wash   To unload small items at bottom of washer, lift leg opposite to arm being used to reach.  Gardening - Raking  Move close to area to be raked. Use arm movements to do the work. Keep back straight and avoid twisting.     Cart  When reaching into cart with one arm, lift opposite leg to keep back straight.   Getting Into / Out of Bed  Lower self to lie down on one side by raising legs and lowering head at the same time. Use arms to assist moving without twisting. Bend both knees to roll onto back if desired. To sit up, start from lying on side, and use same move-ments in reverse. Housework - Vacuuming  Hold the vacuum with arm held at side. Step back and forth to move it, keeping head up. Avoid twisting.   Laundry - Loading Wash  Position laundry basket so that bending and twisting can be avoided.   Laundry - Unloading Dryer  Squat down to reach into clothes dryer or use a reacher.  Gardening - Weeding / Planting  Squat or Kneel. Knee pads may be helpful.                    

## 2020-03-10 ENCOUNTER — Encounter: Payer: Self-pay | Admitting: Physical Therapy

## 2020-03-10 ENCOUNTER — Encounter: Payer: BLUE CROSS/BLUE SHIELD | Attending: Obstetrics & Gynecology | Admitting: Physical Therapy

## 2020-03-10 DIAGNOSIS — N941 Unspecified dyspareunia: Secondary | ICD-10-CM

## 2020-03-10 DIAGNOSIS — R252 Cramp and spasm: Secondary | ICD-10-CM | POA: Diagnosis present

## 2020-03-10 DIAGNOSIS — M6281 Muscle weakness (generalized): Secondary | ICD-10-CM | POA: Diagnosis not present

## 2020-03-10 NOTE — Therapy (Signed)
[place] at [facility] [address], [place], [place], [address] Phone: [phone]   Fax:  [phone]  Physical Therapy Treatment  Patient Details  Name: Ann Foster MRN: [id number] Date of Birth: [birth year] Referring Provider (PT): Dr. Silas Sacramento   Encounter Date: 03/10/2020   PT End of Session - 03/10/20 0920    Visit Number 15    Date for PT Re-Evaluation 05/05/20    Authorization Type BCBS    Authorization - Visit Number 15    Authorization - Number of Visits 30    PT Start Time 0830    PT Stop Time 0915    PT Time Calculation (min) 45 min    Activity Tolerance Patient tolerated treatment well    Behavior During Therapy [facility] for tasks assessed/performed           Past Medical History:  Diagnosis Date  . Ankle fracture 2016   Right  . Aortic atherosclerosis (HCC)    trace calcific atherosclerosis aortic arch per ct neck done 12-22-17  . Bronchitis   . Diabetes mellitus without complication ([place])   . Hypertension   . OA (osteoarthritis) of shoulder    left shoulder, both knees arthritis    Past Surgical History:  Procedure Laterality Date  . NO PAST SURGERIES      There were no vitals filed for this visit.   Subjective Assessment - 03/10/20 0828    Subjective The pelvic floor is doing well. Patient is on the dilator before the largest size. NO pain with the dilator. I am using it prior to intercourse. No pain with intercourse. I just have to go slow.    Pertinent History h/o R ankle fracture, plantar fasciitis?, pelvic pain , diabetes, HTN    Limitations Sitting;Lifting;Standing;Walking;House hold activities    How long can you sit comfortably? takes time to stand after sitting    How long can you stand comfortably? not long < 5 min dpeends on the day    How long can you walk comfortably? has to stop after 5-10 min    Diagnostic tests MRI 9/26    Patient Stated Goals reduce pain    Currently in  Pain? No/denies              [facility] PT Assessment - 03/10/20 0001      Assessment   Medical Diagnosis R10.2 Vaginal pain    Referring Provider (PT) Dr. Silas [place]    Prior Therapy Yes for pelvic , current      Precautions   Precautions None      Restrictions   Weight Bearing Restrictions No      Home Environment   Living Environment Private residence      Prior Function   Level of Independence Independent    Leisure walk       Cognition   Overall Cognitive Status Within Functional Limits for tasks assessed      Sensation   Light Touch Impaired by gross assessment      AROM   Lumbar Extension decreased by 25%      PROM   Right Hip External Rotation  30    Right Hip Internal Rotation  15    Left Hip External Rotation  25      Strength   Right Hip Flexion 3+/5    Right Hip Extension 3/5    Right Hip External Rotation  4/5    Right Hip Internal Rotation 4/5  Right Hip ABduction 4-/5    Right Hip ADduction 4-/5    Left Hip Flexion 4/5    Left Hip Extension 4/5    Left Hip ABduction 4/5    Left Hip ADduction 4/5    Right Knee Flexion 3+/5    Right Knee Extension 3/5    Left Knee Flexion 4+/5    Left Knee Extension 4+/5    Right Ankle Dorsiflexion 3+/5      Palpation   SI assessment  ASIS are equal                      Pelvic Floor Special Questions - 03/10/20 0001    Currently Sexually Active Yes    Is this Painful No    History of sexually transmitted disease No    Urinary Leakage No    Fecal incontinence No    Exam Type Deferred   due to no pain or leakage   Strength good squeeze, good lift, able to hold agaisnt strong resistance             [facility] Adult PT Treatment/Exercise - 03/10/20 0001      Self-Care   Self-Care Other Self-Care Comments    Other Self-Care Comments  Discussed with patient on progression of the last dilator and how she is to do it. education on ways to get assistance for her vaginal suppositories;  instructed patient on urge to void and techniques to reduce the urge including counting backwards, saying the asphabet backwards; discussed baldder irritants and how they affect the bladder; discussed ways to reduce hot flashes to assist in better quality of sleep; discussed vaginal health to reduc the burining feeling and how it reduces UTI      Neuro Re-ed    Neuro Re-ed Details  diaphragmatic breathing to elongate the pelvic floor                   PT Education - 03/10/20 0916    Education Details education on urge to void and techniques to delay; information on assistance for prescriptions, Ways to relax the bladder and pelvic floor muscles when she has the burning    Person(s) Educated Patient    Methods Explanation;Demonstration;Handout    Comprehension Verbalized understanding;Returned demonstration            PT Short Term Goals - 03/10/20 0925      PT SHORT TERM GOAL #1   Title independent with initial HEP    Time 4    Period Weeks    Status Achieved      PT SHORT TERM GOAL #2   Title understand how moisturizers assist in in vaginal health and reduce dryness    Time 4    Period Weeks    Status Achieved    Target Date 12/17/19      PT SHORT TERM GOAL #3   Title understand what lubricants without glycerin are good for vaginal health    Time 4    Period Weeks    Status Achieved             PT Long Term Goals - 03/10/20 3016      PT LONG TERM GOAL #1   Title independent with advanced  HEP    Time 8    Period Weeks    Status Achieved      PT LONG TERM GOAL #2   Title pain with intercourse is </= 1/10 due to improved moisture  and using appropriate lubricants    Time 12    Period Weeks    Status Achieved      PT LONG TERM GOAL #3   Title improve right hip mobility and reduction in pain so she is able to get into a comfortable position for intercourse    Baseline working with another physical therapist for this goal    Time 12    Period Weeks     Status Deferred      PT LONG TERM GOAL #4   Title able to have a vaginal exam due to improved elongation of the pelvic floor muscle and minimal to no pain upon palpation    Time 12    Period Weeks    Status Achieved                 Plan - 03/10/20 3716    Clinical Impression Statement Patient is able to have intercourse with no pain. She is on the second to last dilator. Therapist explained to patient on how to progress to the last dilator and it should not be painful. Patient is not having urinary leakage. She has not had a UTI since she started pelvic floor physical therapy due to understanding vaginal health and how to take care o the vaginal tissue. Patient is going to physical therapy for her back and hip at the [facility]. Patient is independent with her HEP and ready for discharge from pelvic floor physical therapy.    Personal Factors and Comorbidities Sex;Age;Comorbidity 3+    Comorbidities Diabetes, pelvic , OA and ankle fracture    Examination-Activity Limitations Bed Mobility;Stairs;Stand;Hygiene/Grooming;Caring for Others;Lift;Sleep;Transfers;Squat;Adult nurse;Bathing;Bend;Sit    Examination-Participation Restrictions Interpersonal Relationship;Community Activity    Stability/Clinical Decision Making Unstable/Unpredictable    Rehab Potential Good    PT Treatment/Interventions ADLs/Self Care Home Management;Biofeedback;Cryotherapy;Electrical Stimulation;Iontophoresis 4mg /ml Dexamethasone;Moist Heat;Ultrasound;Gait training;Therapeutic activities;Therapeutic exercise;Manual techniques;Patient/family education;Neuromuscular re-education;Passive range of motion;Dry needling;Spinal Manipulations;Joint Manipulations    PT Next Visit Plan Discharge from pelvic floor physical therapy.. She is to continue with physical therapy for her back and hip at the [facility] physical therapy    PT Home Exercise Plan PELVIC : [id number].Marland Kitchen BACK: Prone prop, press up  and Tr A, sidelying Rt trunk; 10/11 (BACK) - supine KTOS, seated hip flexor stretch    Consulted and Agree with Plan of Care Patient           Patient will benefit from skilled therapeutic intervention in order to improve the following deficits and impairments:  Decreased activity tolerance, Decreased strength, Increased fascial restricitons, Pain, Decreased mobility, Increased muscle spasms, Decreased range of motion, Decreased coordination, Obesity, Postural dysfunction, Impaired flexibility, Impaired sensation, Difficulty walking  Visit Diagnosis: Muscle weakness (generalized)  Cramp and spasm  Dyspareunia in female     Problem List Patient Active Problem List   Diagnosis Date Noted  . Impingement syndrome of left shoulder 01/23/2018    Earlie Counts, PT 03/10/20 9:29 AM   [facility] at [facility] [address] [place], [place], [address] Phone: [phone]   Fax:  [phone]  Name: Kelsie Zaborowski MRN: [id number] Date of Birth: September 18, 1965  PHYSICAL THERAPY DISCHARGE SUMMARY  Visits from Start of Care: 10  Current functional level related to goals / functional outcomes: See above.   Remaining deficits: See above.    Education / Equipment: HEP; Vaginal dilators Plan: Patient agrees to discharge.  Patient goals were met. Patient is being discharged due to meeting  the stated rehab goals.  Thank you for the referral. Earlie Counts, PT 03/10/20 9:29 AM  ?????

## 2020-03-10 NOTE — Patient Instructions (Addendum)

## 2020-03-11 ENCOUNTER — Encounter: Payer: No Typology Code available for payment source | Admitting: Physical Therapy

## 2020-03-12 ENCOUNTER — Other Ambulatory Visit: Payer: Self-pay

## 2020-03-12 ENCOUNTER — Ambulatory Visit: Payer: BLUE CROSS/BLUE SHIELD

## 2020-03-12 DIAGNOSIS — M6281 Muscle weakness (generalized): Secondary | ICD-10-CM | POA: Diagnosis not present

## 2020-03-12 DIAGNOSIS — R252 Cramp and spasm: Secondary | ICD-10-CM

## 2020-03-12 DIAGNOSIS — M25651 Stiffness of right hip, not elsewhere classified: Secondary | ICD-10-CM

## 2020-03-12 DIAGNOSIS — M25551 Pain in right hip: Secondary | ICD-10-CM

## 2020-03-12 NOTE — Therapy (Signed)
[facility] [address]  [place], [place], [address] Phone: [phone]   Fax:  [phone]  Physical Therapy Treatment  Patient Details  Name: Ann Foster MRN: [id number] Date of Birth: 14-Apr-1966 Referring Provider (PT): Dr. Silas Sacramento   Encounter Date: 03/12/2020   PT End of Session - 03/12/20 0808    Visit Number 16    Date for PT Re-Evaluation 05/05/20    Authorization Type BCBS    Authorization - Visit Number 16    Authorization - Number of Visits 30    PT Start Time 0801    PT Stop Time 0845    PT Time Calculation (min) 44 min    Activity Tolerance Patient tolerated treatment well    Behavior During Therapy [facility] for tasks assessed/performed           Past Medical History:  Diagnosis Date  . Ankle fracture 2016   Right  . Aortic atherosclerosis (HCC)    trace calcific atherosclerosis aortic arch per ct neck done 12-22-17  . Bronchitis   . Diabetes mellitus without complication ([place])   . Hypertension   . OA (osteoarthritis) of shoulder    left shoulder, both knees arthritis    Past Surgical History:  Procedure Laterality Date  . NO PAST SURGERIES      There were no vitals filed for this visit.   Subjective Assessment - 03/12/20 0807    Subjective Pt. noting no issues with performing updated hip stretching HEP activities received two PT sessions ago.    Pertinent History h/o R ankle fracture, plantar fasciitis?, pelvic pain , diabetes, HTN    Diagnostic tests MRI 9/26    Patient Stated Goals reduce pain    Currently in Pain? No/denies    Pain Score 0-No pain    Multiple Pain Sites Yes    Pain Score 9    Pain Location Hip    Pain Orientation Right;Anterior;Lateral    Pain Descriptors / Indicators Sharp    Pain Type Acute pain    Pain Onset More than a month ago    Pain Frequency Intermittent                             [facility] Adult PT Treatment/Exercise -  03/12/20 0001      Self-Care   Self-Care Other Self-Care Comments    Other Self-Care Comments  use of tennis ball on wall to R glutes max, med      Lumbar Exercises: Stretches   Hip Flexor Stretch Right;30 seconds;3 reps    Hip Flexor Stretch Limitations seated lunge position over edge of chair    Piriformis Stretch Right;30 seconds;3 reps      Lumbar Exercises: Aerobic   Nustep L3 x 6 min (UE/LE)      Moist Heat Therapy   Number Minutes Moist Heat 10 Minutes    Moist Heat Location Hip   R anterior/lateral hip     Manual Therapy   Manual Therapy Soft tissue mobilization;Myofascial release    Manual therapy comments mod thomas position     Soft tissue mobilization DTM to R quads, R iliacus, TFL, glute,     Myofascial Release R TPR to R iliacus, R proximal quads, R TFL                    PT Short Term Goals - 03/10/20 8588  PT SHORT TERM GOAL #1   Title independent with initial HEP    Time 4    Period Weeks    Status Achieved      PT SHORT TERM GOAL #2   Title understand how moisturizers assist in in vaginal health and reduce dryness    Time 4    Period Weeks    Status Achieved    Target Date 12/17/19      PT SHORT TERM GOAL #3   Title understand what lubricants without glycerin are good for vaginal health    Time 4    Period Weeks    Status Achieved             PT Long Term Goals - 03/10/20 5631      PT LONG TERM GOAL #1   Title independent with advanced  HEP    Time 8    Period Weeks    Status Achieved      PT LONG TERM GOAL #2   Title pain with intercourse is </= 1/10 due to improved moisture and using appropriate lubricants    Time 12    Period Weeks    Status Achieved      PT LONG TERM GOAL #3   Title improve right hip mobility and reduction in pain so she is able to get into a comfortable position for intercourse    Baseline working with another physical therapist for this goal    Time 12    Period Weeks    Status Deferred        PT LONG TERM GOAL #4   Title able to have a vaginal exam due to improved elongation of the pelvic floor muscle and minimal to no pain upon palpation    Time 12    Period Weeks    Status Achieved                 Plan - 03/12/20 1214    Clinical Impression Statement Ann Foster with complaint of R anterior/lateral hip pain which is intermittent in nature most bothersome when walking.  MT addressed significant anterior/lateral hip tightness (TFL, iliacus, RF) with twitch response elicited with TPR and significant tenderness reported by pt.  Duration of session focused on instruction in tennis ball self-release to glutes and gentle LE stretching with pt. demonstrating good HEP technique with recent HEP update review.  Trialed anterior hip moist heat for relaxation of musculature with good response.    Comorbidities Diabetes, pelvic , OA and ankle fracture    Rehab Potential Good    PT Frequency 2x / week   LBP   PT Duration 8 weeks    PT Treatment/Interventions ADLs/Self Care Home Management;Biofeedback;Cryotherapy;Electrical Stimulation;Iontophoresis 4mg /ml Dexamethasone;Moist Heat;Ultrasound;Gait training;Therapeutic activities;Therapeutic exercise;Manual techniques;Patient/family education;Neuromuscular re-education;Passive range of motion;Dry needling;Spinal Manipulations;Joint Manipulations    PT Next Visit Plan Continued STM/DTM to R anterior/lateral hip; LE flexiliibyt; modalities as indicated    PT Home Exercise Plan PELVIC : [id number].Marland Kitchen BACK: Prone prop, press up and Tr A, sidelying Rt trunk; 10/11 (BACK) - supine KTOS, seated hip flexor stretch    Consulted and Agree with Plan of Care Patient           Patient will benefit from skilled therapeutic intervention in order to improve the following deficits and impairments:  Decreased activity tolerance, Decreased strength, Increased fascial restricitons, Pain, Decreased mobility, Increased muscle spasms, Decreased range of motion,  Decreased coordination, Obesity, Postural dysfunction, Impaired flexibility, Impaired sensation, Difficulty walking  Visit Diagnosis:  Muscle weakness (generalized)  Pain in right hip  Stiffness of right hip, not elsewhere classified  Cramp and spasm     Problem List Patient Active Problem List   Diagnosis Date Noted  . Impingement syndrome of left shoulder 01/23/2018    Bess Harvest, PTA 03/12/20 12:15 PM   [facility] [address]  [place], [place], [address] Phone: [phone]   Fax:  [phone]  Name: Ann Foster MRN: [id number] Date of Birth: [birth year]

## 2020-03-16 ENCOUNTER — Other Ambulatory Visit: Payer: Self-pay

## 2020-03-16 ENCOUNTER — Ambulatory Visit: Payer: BLUE CROSS/BLUE SHIELD | Admitting: Physical Therapy

## 2020-03-16 ENCOUNTER — Telehealth: Payer: Self-pay | Admitting: Nurse Practitioner

## 2020-03-16 ENCOUNTER — Encounter: Payer: Self-pay | Admitting: Physical Therapy

## 2020-03-16 DIAGNOSIS — M25651 Stiffness of right hip, not elsewhere classified: Secondary | ICD-10-CM

## 2020-03-16 DIAGNOSIS — R252 Cramp and spasm: Secondary | ICD-10-CM

## 2020-03-16 DIAGNOSIS — M6281 Muscle weakness (generalized): Secondary | ICD-10-CM | POA: Diagnosis not present

## 2020-03-16 DIAGNOSIS — M25551 Pain in right hip: Secondary | ICD-10-CM

## 2020-03-16 NOTE — Patient Instructions (Signed)
TENS UNIT  This is helpful for muscle pain and spasm.   Search and Purchase a TENS 7000 2nd edition at www.tenspros.com or www.amazon.com  (It should be less than $30)     TENS unit instructions:   Do not shower or bathe with the unit on  Turn the unit off before removing electrodes or batteries  If the electrodes lose stickiness add a drop of water to the electrodes after they are disconnected from the unit and place on plastic sheet. If you continued to have difficulty, call the TENS unit company to purchase more electrodes.  Do not apply lotion on the skin area prior to use. Make sure the skin is clean and dry as this will help prolong the life of the electrodes.  After use, always check skin for unusual red areas, rash or other skin difficulties. If there are any skin problems, does not apply electrodes to the same area.  Never remove the electrodes from the unit by pulling the wires.  Do not use the TENS unit or electrodes other than as directed.  Do not change electrode placement without consulting your therapist or physician.  Keep 2 fingers with between each electrode.   TENS stands for Transcutaneous Electrical Nerve Stimulation. In other words, electrical impulses are allowed to pass through the skin in order to excite a nerve.   Purpose and Use of TENS:  TENS is a method used to manage acute and chronic pain without the use of drugs. It has been effective in managing pain associated with surgery, sprains, strains, trauma, rheumatoid arthritis, and neuralgias. It is a non-addictive, low risk, and non-invasive technique used to control pain. It is not, by any means, a curative form of treatment.   How TENS Works:  Most TENS units are a Paramedic unit powered by one 9 volt battery. Attached to the outside of the unit are two lead wires where two pins and/or snaps connect on each wire. All units come with a set of four reusable pads or electrodes. These are placed  on the skin surrounding the area involved. By inserting the leads into  the pads, the electricity can pass from the unit making the circuit complete.  As the intensity is turned up slowly, the electrical current enters the body from the electrodes through the skin to the surrounding nerve fibers. This triggers the release of hormones from within the body. These hormones contain pain relievers. By increasing the circulation of these hormones, the persons pain may be lessened. It is also believed that the electrical stimulation itself helps to block the pain messages being sent to the brain, thus also decreasing the bodys perception of pain.   Hazards:  TENS units are NOT to be used by patients with PACEMAKERS, DEFIBRILLATORS, DIABETIC PUMPS, PREGNANT WOMEN, and patients with SEIZURE DISORDERS.  TENS units are NOT to be used over the heart, throat, brain, or spinal cord.  One of the major side effects from the TENS unit may be skin irritation. Some people may develop a rash if they are sensitive to the materials used in the electrodes or the connecting wires.   Wear the unit for up to 30-45 minutes at a time, 3-4 times per day as needed for pain.   Avoid overuse due the body getting used to the stem making it not as effective over time.

## 2020-03-16 NOTE — Therapy (Signed)
[facility] [address]  [place], [place], [address] Phone: [phone]   Fax:  [phone]  Physical Therapy Treatment  Patient Details  Name: Ann Foster MRN: [id number] Date of Birth: 06-Apr-1966 Referring Provider (PT): Alda Berthold, DO & Trula Slade, DPM (Dr. Posey Pronto - LBP/radiculopathy; Dr. Jacqualyn Posey - foot pain)   Encounter Date: 03/16/2020   PT End of Session - 03/16/20 0859    Visit Number 38   7th visit for back   Date for PT Re-Evaluation 05/05/20    Authorization Type BCBS    Authorization - Visit Number 71    Authorization - Number of Visits 30    PT Start Time 0845    PT Stop Time 0940    PT Time Calculation (min) 55 min    Activity Tolerance Patient limited by pain    Behavior During Therapy [facility] for tasks assessed/performed           Past Medical History:  Diagnosis Date  . Ankle fracture 2016   Right  . Aortic atherosclerosis (HCC)    trace calcific atherosclerosis aortic arch per ct neck done 12-22-17  . Bronchitis   . Diabetes mellitus without complication ([place])   . Hypertension   . OA (osteoarthritis) of shoulder    left shoulder, both knees arthritis    Past Surgical History:  Procedure Laterality Date  . NO PAST SURGERIES      There were no vitals filed for this visit.   Subjective Assessment - 03/16/20 0850    Subjective Pt reports she woke up in increased pain today (starting yesterday) with no known trigger.    Pertinent History h/o R ankle fracture, plantar fasciitis?, pelvic pain , diabetes, HTN    Diagnostic tests MRI 9/26    Patient Stated Goals reduce pain    Currently in Pain? Yes    Pain Score 10-Worst pain ever    Pain Location Back    Pain Orientation Lower;Right    Pain Descriptors / Indicators Throbbing;Burning    Pain Type Chronic pain    Pain Radiating Towards into R hip and down leg with numb in lateral 2 toes    Pain Onset More than a month ago     Pain Frequency Constant    Pain Onset More than a month ago              [name] - 03/16/20 0845      Assessment   Medical Diagnosis Lumbar spondylolysis & stenosis with radiculopathy; plantar fasciitis & chronic foot pain    Referring Provider (PT) Alda Berthold, DO & Trula Slade, DPM   Dr. Posey Pronto - LBP/radiculopathy; Dr. Jacqualyn Posey - foot pain   Next MD Visit 06/01/20 - Dr. Zara Chess Adult PT Treatment/Exercise - 03/16/20 0845      Lumbar Exercises: Aerobic   Nustep L2 x 1 min - stopped due to increased pain      Moist Heat Therapy   Number Minutes Moist Heat 15 Minutes    Moist Heat Location Lumbar Spine;Hip   R buttocks     Electrical Stimulation   Electrical Stimulation Location Low back & R glutes    Electrical Stimulation Action IFC    Electrical Stimulation Parameters 80-150 Hz, intensity to pt tol x 15'  Electrical Stimulation Goals Pain;Tone      Manual Therapy   Manual Therapy Soft tissue mobilization;Myofascial release    Manual therapy comments skilled palpation and monitoring during DN    Soft tissue mobilization STM/DTM to B lumbar paraspinals & R glutes/piriformis -very ttp with limited tolerance    Myofascial Release manual TPR to R upper glutes; pin & stretch to lumbar paraspinals - poor tolerance            Trigger Point Dry Needling - 03/16/20 0845    Consent Given? Yes    Education Handout Provided Previously provided    Muscles Treated Back/Hip Gluteus minimus;Gluteus medius;Lumbar multifidi    Electrical Stimulation Performed with Dry Needling Yes    E-stim with Dry Needling Details lumbar paraspinals & R glutes    Gluteus Minimus Response Twitch response elicited;Palpable increased muscle length    Gluteus Medius Response Twitch response elicited;Palpable increased muscle length    Lumbar multifidi Response Twitch response elicited;Palpable increased muscle length                 PT Education - 03/16/20 1022    Education Details Info on home TENS unit options    Person(s) Educated Patient    Methods Explanation;Handout    Comprehension Verbalized understanding            PT Short Term Goals - 03/10/20 0925      PT SHORT TERM GOAL #1   Title independent with initial HEP    Time 4    Period Weeks    Status Achieved      PT SHORT TERM GOAL #2   Title understand how moisturizers assist in in vaginal health and reduce dryness    Time 4    Period Weeks    Status Achieved    Target Date 12/17/19      PT SHORT TERM GOAL #3   Title understand what lubricants without glycerin are good for vaginal health    Time 4    Period Weeks    Status Achieved             PT Long Term Goals - 03/10/20 1610      PT LONG TERM GOAL #1   Title independent with advanced  HEP    Time 8    Period Weeks    Status Achieved      PT LONG TERM GOAL #2   Title pain with intercourse is </= 1/10 due to improved moisture and using appropriate lubricants    Time 12    Period Weeks    Status Achieved      PT LONG TERM GOAL #3   Title improve right hip mobility and reduction in pain so she is able to get into a comfortable position for intercourse    Baseline working with another physical therapist for this goal    Time 12    Period Weeks    Status Deferred      PT LONG TERM GOAL #4   Title able to have a vaginal exam due to improved elongation of the pelvic floor muscle and minimal to no pain upon palpation    Time 12    Period Weeks    Status Achieved                 Plan - 03/16/20 0926    Clinical Impression Statement Ann Foster reporting increased pain yesterday and today w/o known trigger. Pain at 10/10 preventing her from completing  a warm-up and limiting tolerance for all mobility and exercise. She is extremely ttp over B lumbar paraspinals and R glutes with limited tolerance for manual STM and MFR as well as DN, although able to elicit twitch  response with palpable reduction in muscle tension with DN. Session concluded with estim and moist heat to promote further pain reduction and muscle relaxation with pt noting some benefit from estim and heat, therefore provide information on home TENS unit.    Comorbidities Diabetes, pelvic , OA and ankle fracture    Rehab Potential Good    PT Frequency 2x / week   LBP   PT Duration 8 weeks    PT Treatment/Interventions ADLs/Self Care Home Management;Biofeedback;Cryotherapy;Electrical Stimulation;Iontophoresis 4mg /ml Dexamethasone;Moist Heat;Ultrasound;Gait training;Therapeutic activities;Therapeutic exercise;Manual techniques;Patient/family education;Neuromuscular re-education;Passive range of motion;Dry needling;Spinal Manipulations;Joint Manipulations    PT Next Visit Plan continue STM/DTM to lumbar spine & R anterior/lateral hip; LE flexibility; lumbopelvic strengthening; modalities as indicated    PT Home Exercise Plan PELVIC : [id number].Marland Kitchen BACK: Prone prop, press up and Tr A, sidelying Rt trunk; 10/11 (BACK) - supine KTOS, seated hip flexor stretch    Consulted and Agree with Plan of Care Patient           Patient will benefit from skilled therapeutic intervention in order to improve the following deficits and impairments:  Decreased activity tolerance, Decreased strength, Increased fascial restricitons, Pain, Decreased mobility, Increased muscle spasms, Decreased range of motion, Decreased coordination, Obesity, Postural dysfunction, Impaired flexibility, Impaired sensation, Difficulty walking  Visit Diagnosis: Muscle weakness (generalized)  Pain in right hip  Stiffness of right hip, not elsewhere classified  Cramp and spasm     Problem List Patient Active Problem List   Diagnosis Date Noted  . Impingement syndrome of left shoulder 01/23/2018    Percival Spanish, PT, MPT 03/16/2020, 12:13 PM  [facility] [address]   [place], [place], [address] Phone: [phone]   Fax:  [phone]  Name: Ann Foster MRN: [id number] Date of Birth: 03-22-1966

## 2020-03-16 NOTE — Telephone Encounter (Signed)
Ann Foster is calling from [employer] calling to check on the status of disabilty claim forms. Please advise Cb- [phone]

## 2020-03-19 ENCOUNTER — Ambulatory Visit: Payer: BLUE CROSS/BLUE SHIELD

## 2020-03-19 ENCOUNTER — Other Ambulatory Visit: Payer: Self-pay

## 2020-03-19 DIAGNOSIS — M6281 Muscle weakness (generalized): Secondary | ICD-10-CM

## 2020-03-19 DIAGNOSIS — R252 Cramp and spasm: Secondary | ICD-10-CM

## 2020-03-19 DIAGNOSIS — M25551 Pain in right hip: Secondary | ICD-10-CM

## 2020-03-19 DIAGNOSIS — M25651 Stiffness of right hip, not elsewhere classified: Secondary | ICD-10-CM

## 2020-03-19 NOTE — Telephone Encounter (Signed)
Received. Will fax back once completed by PCP.

## 2020-03-19 NOTE — Therapy (Signed)
[facility] [address]  [place], [place], [address] Phone: [phone]   Fax:  [phone]  Physical Therapy Treatment  Patient Details  Name: Ann Foster MRN: [id number] Date of Birth: [birth year] Referring Provider (PT): Alda Berthold, DO & Trula Slade, DPM (Dr. Posey Pronto - LBP/radiculopathy; Dr. Jacqualyn Posey - foot pain)   Encounter Date: 03/19/2020   PT End of Session - 03/19/20 0806    Visit Number 18   8th visit for back   Date for PT Re-Evaluation 05/05/20    Authorization Type BCBS    Authorization - Visit Number 18    Authorization - Number of Visits 30    PT Start Time 0800    PT Stop Time 0853    PT Time Calculation (min) 53 min    Activity Tolerance Patient limited by pain    Behavior During Therapy [facility] for tasks assessed/performed           Past Medical History:  Diagnosis Date  . Ankle fracture 2016   Right  . Aortic atherosclerosis (HCC)    trace calcific atherosclerosis aortic arch per ct neck done 12-22-17  . Bronchitis   . Diabetes mellitus without complication ([place])   . Hypertension   . OA (osteoarthritis) of shoulder    left shoulder, both knees arthritis    Past Surgical History:  Procedure Laterality Date  . NO PAST SURGERIES      There were no vitals filed for this visit.   Subjective Assessment - 03/19/20 0804    Subjective Pt. noting a few hours relief from pain after last session.    Pertinent History h/o R ankle fracture, plantar fasciitis?, pelvic pain , diabetes, HTN    Diagnostic tests MRI 9/26    Patient Stated Goals reduce pain    Currently in Pain? Yes    Pain Score 10-Worst pain ever    Pain Location Back    Pain Orientation Right;Lower    Pain Descriptors / Indicators Nagging    Pain Type Chronic pain    Pain Radiating Towards into R lateral hip and down R lateral LE to 2 lateral toes    Pain Onset More than a month ago    Pain Frequency Constant     Multiple Pain Sites No                             [facility] Adult PT Treatment/Exercise - 03/19/20 0001      Lumbar Exercises: Stretches   Passive Hamstring Stretch Right;1 rep;30 seconds   very tight    Passive Hamstring Stretch Limitations Manual stretch with therapist     Single Knee to Chest Stretch Right;1 rep;30 seconds    Single Knee to Chest Stretch Limitations Manual with therapist    very tight    Piriformis Stretch Right;30 seconds;3 reps   very tight    Piriformis Stretch Limitations Manual KTOS      Lumbar Exercises: Aerobic   Nustep lvl 1, 4 min    stopped due to increased pain      Lumbar Exercises: Supine   Bent Knee Raise 10 reps;3 seconds    Bent Knee Raise Limitations Brace march     Bridge 10 reps    Bridge Limitations limited ROM       Knee/Hip Exercises: Sidelying   Clams R clam shell x 10 reps from blue bolster  Moist Heat Therapy   Number Minutes Moist Heat 15 Minutes    Moist Heat Location Lumbar Spine;Hip   R buttocks      Electrical Stimulation   Electrical Stimulation Location Low back & R glutes    Electrical Stimulation Action IFC    Electrical Stimulation Parameters 80-150Hz , intensity to pt. tolerance, 15'    Electrical Stimulation Goals Pain;Tone      Manual Therapy   Manual Therapy Soft tissue mobilization;Myofascial release    Manual therapy comments sidelying     Soft tissue mobilization STM/DTM to R glutes/piriformis, VL, lat HS -very ttp with limited tolerance    Myofascial Release manual TPR to R upper glutes; pin & stretch to lumbar paraspinals - limited tolerance                    PT Short Term Goals - 03/10/20 0925      PT SHORT TERM GOAL #1   Title independent with initial HEP    Time 4    Period Weeks    Status Achieved      PT SHORT TERM GOAL #2   Title understand how moisturizers assist in in vaginal health and reduce dryness    Time 4    Period Weeks    Status Achieved    Target Date  12/17/19      PT SHORT TERM GOAL #3   Title understand what lubricants without glycerin are good for vaginal health    Time 4    Period Weeks    Status Achieved             PT Long Term Goals - 03/10/20 4782      PT LONG TERM GOAL #1   Title independent with advanced  HEP    Time 8    Period Weeks    Status Achieved      PT LONG TERM GOAL #2   Title pain with intercourse is </= 1/10 due to improved moisture and using appropriate lubricants    Time 12    Period Weeks    Status Achieved      PT LONG TERM GOAL #3   Title improve right hip mobility and reduction in pain so she is able to get into a comfortable position for intercourse    Baseline working with another physical therapist for this goal    Time 12    Period Weeks    Status Deferred      PT LONG TERM GOAL #4   Title able to have a vaginal exam due to improved elongation of the pelvic floor muscle and minimal to no pain upon palpation    Time 12    Period Weeks    Status Achieved                 Plan - 03/19/20 0807    Clinical Impression Statement Ann Foster reporting she had a few hours of pain reduction in her R hip/LE after last session.  Has tried tennis ball self-massage to R hip on wall however this is to painful thus she stopped.  MT addressing increased tension/significant tightness in global R proximal hip/thigh musculature with limited tolerance for LE manual stretching with therapist however able to initiate gentle lumbopelvic strengthening.  Ended visit with trial of E-stim/moist heat to reduce pain and tone.  Encouraged pt. to continue self-massage and HEP activities as able to at home along with given rationale for need for improved muscular relaxation  at R hip musculature for daily tasks such as sit<>stand and walking.    Comorbidities Diabetes, pelvic , OA and ankle fracture    Rehab Potential Good    PT Treatment/Interventions ADLs/Self Care Home Management;Biofeedback;Cryotherapy;Electrical  Stimulation;Iontophoresis 4mg /ml Dexamethasone;Moist Heat;Ultrasound;Gait training;Therapeutic activities;Therapeutic exercise;Manual techniques;Patient/family education;Neuromuscular re-education;Passive range of motion;Dry needling;Spinal Manipulations;Joint Manipulations    PT Next Visit Plan continue STM/DTM to lumbar spine & R anterior/lateral hip; LE flexibility; lumbopelvic strengthening; modalities as indicated    PT Home Exercise Plan PELVIC : [id number].Marland Kitchen BACK: Prone prop, press up and Tr A, sidelying Rt trunk; 10/11 (BACK) - supine KTOS, seated hip flexor stretch    Consulted and Agree with Plan of Care Patient           Patient will benefit from skilled therapeutic intervention in order to improve the following deficits and impairments:  Decreased activity tolerance, Decreased strength, Increased fascial restricitons, Pain, Decreased mobility, Increased muscle spasms, Decreased range of motion, Decreased coordination, Obesity, Postural dysfunction, Impaired flexibility, Impaired sensation, Difficulty walking  Visit Diagnosis: Muscle weakness (generalized)  Pain in right hip  Stiffness of right hip, not elsewhere classified  Cramp and spasm     Problem List Patient Active Problem List   Diagnosis Date Noted  . Impingement syndrome of left shoulder 01/23/2018    Bess Harvest, PTA 03/19/20 8:49 AM   [facility] [address]  [place], [place], [address] Phone: [phone]   Fax:  [phone]  Name: Kaleia Longhi MRN: [id number] Date of Birth: 02/22/1966

## 2020-03-23 ENCOUNTER — Telehealth: Payer: Self-pay

## 2020-03-23 ENCOUNTER — Ambulatory Visit: Payer: BLUE CROSS/BLUE SHIELD

## 2020-03-23 ENCOUNTER — Other Ambulatory Visit: Payer: Self-pay

## 2020-03-23 DIAGNOSIS — R252 Cramp and spasm: Secondary | ICD-10-CM

## 2020-03-23 DIAGNOSIS — M25551 Pain in right hip: Secondary | ICD-10-CM

## 2020-03-23 DIAGNOSIS — M6281 Muscle weakness (generalized): Secondary | ICD-10-CM

## 2020-03-23 DIAGNOSIS — M25651 Stiffness of right hip, not elsewhere classified: Secondary | ICD-10-CM

## 2020-03-23 DIAGNOSIS — M4306 Spondylolysis, lumbar region: Secondary | ICD-10-CM

## 2020-03-23 DIAGNOSIS — M5416 Radiculopathy, lumbar region: Secondary | ICD-10-CM

## 2020-03-23 NOTE — Therapy (Signed)
[facility] [address]  [place], [place], [address] Phone: [phone]   Fax:  [phone]  Physical Therapy Treatment  Patient Details  Name: Ann Foster MRN: [id number] Date of Birth: 1965-08-28 Referring Provider (PT): Alda Berthold, DO & Trula Slade, DPM (Dr. Posey Pronto - LBP/radiculopathy; Dr. Jacqualyn Posey - foot pain)   Encounter Date: 03/23/2020   PT End of Session - 03/23/20 0808    Visit Number 19   9th visit or back   Date for PT Re-Evaluation 05/05/20    Authorization Type BCBS    Authorization - Visit Number 6    Authorization - Number of Visits 30    PT Start Time 0801    PT Stop Time 8338    PT Time Calculation (min) 54 min    Activity Tolerance Patient limited by pain    Behavior During Therapy [facility] for tasks assessed/performed           Past Medical History:  Diagnosis Date  . Ankle fracture 2016   Right  . Aortic atherosclerosis (HCC)    trace calcific atherosclerosis aortic arch per ct neck done 12-22-17  . Bronchitis   . Diabetes mellitus without complication ([place])   . Hypertension   . OA (osteoarthritis) of shoulder    left shoulder, both knees arthritis    Past Surgical History:  Procedure Laterality Date  . NO PAST SURGERIES      There were no vitals filed for this visit.   Subjective Assessment - 03/23/20 0805    Subjective Having some increased hip pain [place]    Pertinent History h/o R ankle fracture, plantar fasciitis?, pelvic pain , diabetes, HTN    Diagnostic tests MRI 9/26    Patient Stated Goals reduce pain    Currently in Pain? Yes    Pain Score 8     Pain Location Back    Pain Orientation Right;Left;Lower    Pain Descriptors / Indicators Nagging    Pain Type Chronic pain    Pain Onset More than a month ago    Pain Frequency Constant    Multiple Pain Sites Yes    Pain Score 8    Pain Location Hip    Pain  Orientation Right;Anterior;Lateral    Pain Descriptors / Indicators Sharp    Pain Type Acute pain    Pain Onset More than a month ago    Pain Frequency Constant    Aggravating Factors  walking                             [facility] Adult PT Treatment/Exercise - 03/23/20 0001      Self-Care   Other Self-Care Comments  use of tennis ball on wall to R glutes max, med      Lumbar Exercises: Stretches   Hip Flexor Stretch Right;2 reps;30 seconds    Hip Flexor Stretch Limitations sitting on chair     Piriformis Stretch Right;30 seconds;1 rep    Piriformis Stretch Limitations Modified piriformis stretch       Lumbar Exercises: Aerobic   Nustep lvl 2, 5 min       Lumbar Exercises: Seated   Other Seated Lumbar Exercises Alternating march seated in chair x 10      Lumbar Exercises: Supine   Clam 10 reps;3 seconds    Clam Limitations yellow looped  TB     Bent Knee Raise 15 reps;3 seconds   R anterior hip pain and slow performance    Bent Knee Raise Limitations Brace march       Manual Therapy   Manual Therapy Soft tissue mobilization;Myofascial release    Manual therapy comments mod thomas stretch position     Soft tissue mobilization STM to R hip flexors, TFL, quads    Myofascial Release Manual TPR to hip flexors                   PT Education - 03/23/20 1201    Education Details HEP update    Person(s) Educated Patient    Methods Explanation;Demonstration;Verbal cues;Handout    Comprehension Verbalized understanding;Returned demonstration;Verbal cues required            PT Short Term Goals - 03/10/20 0925      PT SHORT TERM GOAL #1   Title independent with initial HEP    Time 4    Period Weeks    Status Achieved      PT SHORT TERM GOAL #2   Title understand how moisturizers assist in in vaginal health and reduce dryness    Time 4    Period Weeks    Status Achieved    Target Date 12/17/19      PT SHORT TERM GOAL #3   Title understand what  lubricants without glycerin are good for vaginal health    Time 4    Period Weeks    Status Achieved             PT Long Term Goals - 03/10/20 6568      PT LONG TERM GOAL #1   Title independent with advanced  HEP    Time 8    Period Weeks    Status Achieved      PT LONG TERM GOAL #2   Title pain with intercourse is </= 1/10 due to improved moisture and using appropriate lubricants    Time 12    Period Weeks    Status Achieved      PT LONG TERM GOAL #3   Title improve right hip mobility and reduction in pain so she is able to get into a comfortable position for intercourse    Baseline working with another physical therapist for this goal    Time 12    Period Weeks    Status Deferred      PT LONG TERM GOAL #4   Title able to have a vaginal exam due to improved elongation of the pelvic floor muscle and minimal to no pain upon palpation    Time 12    Period Weeks    Status Achieved                 Plan - 03/23/20 1275    Clinical Impression Statement Pt. reporting [place] and this increased her pain.  Session with heavy MT focus for improved R hip tissue quality with somewhat improved gait pattern and tolerance for lumbopelvic strengthening noted following.  Progressed HEP with updated handout issued to pt. (see pt. education).  Ann Foster continues to note 8/10 pain with walking and hip flexion type activities.  Reviewed LE stretching with pt. today along with self-ball massage to glutes on wall which continue to be tender for her.  Will monitor response to updated HEP in coming session and adjust per pt. tolerance.  Comorbidities Diabetes, pelvic , OA and ankle fracture    Rehab Potential Good    PT Treatment/Interventions ADLs/Self Care Home Management;Biofeedback;Cryotherapy;Electrical Stimulation;Iontophoresis 4mg /ml Dexamethasone;Moist Heat;Ultrasound;Gait training;Therapeutic activities;Therapeutic exercise;Manual  techniques;Patient/family education;Neuromuscular re-education;Passive range of motion;Dry needling;Spinal Manipulations;Joint Manipulations    PT Next Visit Plan continue STM/DTM to lumbar spine & R anterior/lateral hip; LE flexibility; lumbopelvic strengthening; modalities as indicated    PT Home Exercise Plan PELVIC : [id number].Marland Kitchen BACK: Prone prop, press up and Tr A, sidelying Rt trunk; 10/11 (BACK) - supine KTOS, seated hip flexor stretch; 10/25 - modified piri stretch, bridge, ball massage to glutes on wall, seated march    Consulted and Agree with Plan of Care Patient           Patient will benefit from skilled therapeutic intervention in order to improve the following deficits and impairments:  Decreased activity tolerance, Decreased strength, Increased fascial restricitons, Pain, Decreased mobility, Increased muscle spasms, Decreased range of motion, Decreased coordination, Obesity, Postural dysfunction, Impaired flexibility, Impaired sensation, Difficulty walking  Visit Diagnosis: Muscle weakness (generalized)  Pain in right hip  Stiffness of right hip, not elsewhere classified  Cramp and spasm     Problem List Patient Active Problem List   Diagnosis Date Noted  . Impingement syndrome of left shoulder 01/23/2018    Bess Harvest, PTA 03/23/20 12:02 PM   [facility] [address]  [place], [place], [address] Phone: [phone]   Fax:  [phone]  Name: Ann Foster MRN: [id number] Date of Birth: 1965-07-25

## 2020-03-23 NOTE — Telephone Encounter (Signed)
[facility] called and stated they are out of network for patient and a PT referral needs to be sent to a [facility] Provider.

## 2020-03-24 ENCOUNTER — Encounter: Payer: No Typology Code available for payment source | Admitting: Physical Therapy

## 2020-03-25 NOTE — Telephone Encounter (Signed)
Referral sent to [facility].

## 2020-03-26 ENCOUNTER — Ambulatory Visit: Payer: BLUE CROSS/BLUE SHIELD | Admitting: Physical Therapy

## 2020-03-26 ENCOUNTER — Encounter: Payer: Self-pay | Admitting: Physical Therapy

## 2020-03-26 ENCOUNTER — Other Ambulatory Visit: Payer: Self-pay

## 2020-03-26 DIAGNOSIS — R252 Cramp and spasm: Secondary | ICD-10-CM

## 2020-03-26 DIAGNOSIS — M6281 Muscle weakness (generalized): Secondary | ICD-10-CM | POA: Diagnosis not present

## 2020-03-26 DIAGNOSIS — M25651 Stiffness of right hip, not elsewhere classified: Secondary | ICD-10-CM

## 2020-03-26 DIAGNOSIS — M25551 Pain in right hip: Secondary | ICD-10-CM

## 2020-03-26 NOTE — Therapy (Signed)
[facility] [address]  [address] [place], [place], [address] Phone: [phone]   Fax:  [phone]  Physical Therapy Treatment / Progress Note  Patient Details  Name: Ann Foster MRN: [id number] Date of Birth: Jul 01, 1965 Referring Provider (PT): Glendale Chard, DO & Vivi Barrack, DPM (Dr. Allena Katz - LBP/radiculopathy; Dr. Ardelle Anton - foot pain)   Encounter Date: 03/26/2020   PT End of Session - 03/26/20 0848    Visit Number 20   10th visit for back   Number of Visits 30    Date for PT Re-Evaluation 05/05/20    Authorization Type BCBS    Authorization - Visit Number 20    Authorization - Number of Visits 30    PT Start Time 0848    PT Stop Time 0940    PT Time Calculation (min) 52 min    Activity Tolerance Patient limited by pain    Behavior During Therapy [facility] for tasks assessed/performed           Past Medical History:  Diagnosis Date  . Ankle fracture 2016   Right  . Aortic atherosclerosis (HCC)    trace calcific atherosclerosis aortic arch per ct neck done 12-22-17  . Bronchitis   . Diabetes mellitus without complication (HCC)   . Hypertension   . OA (osteoarthritis) of shoulder    left shoulder, both knees arthritis    Past Surgical History:  Procedure Laterality Date  . NO PAST SURGERIES      There were no vitals filed for this visit.   Subjective Assessment - 03/26/20 0851    Subjective Pt reports using the ball for self-STM and the exercises seem to be helping her pain a little.    Pertinent History h/o R ankle fracture, plantar fasciitis?, pelvic pain , diabetes, HTN    Limitations Sitting;Lifting;Standing;Walking;House hold activities    How long can you sit comfortably? 10 minutes if stting straight but usually still has to lean to L hip    How long can you stand comfortably? 15 minutes    How long can you walk comfortably? has to stop after 5-10 min    Diagnostic tests MRI 9/26     Patient Stated Goals reduce pain    Currently in Pain? Yes    Pain Score 8     Pain Location Buttocks    Pain Orientation Right    Pain Descriptors / Indicators Sharp;Aching    Pain Type Chronic pain    Pain Radiating Towards down R lateral leg to mid calf, sometimes to toes    Pain Frequency Constant              [facility] PT Assessment - 03/26/20 0848      Assessment   Medical Diagnosis Lumbar spondylolysis & stenosis with radiculopathy; plantar fasciitis & chronic foot pain    Referring Provider (PT) Glendale Chard, DO & Vivi Barrack, DPM   Dr. Allena Katz - LBP/radiculopathy; Dr. Ardelle Anton - foot pain   Next MD Visit 06/01/20 - Dr. Allena Katz      Strength   Right Hip Flexion 4-/5    Right Hip Extension 3+/5    Right Hip External Rotation  4-/5    Right Hip Internal Rotation 4-/5    Right Hip ABduction 3+/5    Right Hip ADduction 3-/5    Left Hip Flexion 4+/5    Left Hip Extension 4/5    Left Hip External Rotation 4/5  Left Hip Internal Rotation 4+/5    Left Hip ABduction 4/5    Left Hip ADduction 4/5    Right Knee Flexion 3+/5    Right Knee Extension 3+/5    Left Knee Flexion 4+/5    Left Knee Extension 4+/5    Right Ankle Dorsiflexion 3+/5    Left Ankle Dorsiflexion 5/5                         [facility] Adult PT Treatment/Exercise - 03/26/20 0848      Lumbar Exercises: Stretches   Other Lumbar Stretch Exercise B hip adductor butterfly stretch x 30 sec      Lumbar Exercises: Aerobic   Nustep L3 x 4.5 min      Lumbar Exercises: Prone   Other Prone Lumbar Exercises Provided education in McKenzie extension progression in prone lying to promote centralization of pain      Manual Therapy   Manual Therapy Soft tissue mobilization;Myofascial release;Muscle Energy Technique;Other (comment)    Soft tissue mobilization STM/DTM to R TLF, distal iliopsoas and hip adductors    Myofascial Release manual TPR to R TLF, distal iliopsoas and hip adductors    Other Manual  Therapy Instructed pt in use of rolling pin for self-STM to quads, hip adductors, HS and ITB    Muscle Energy Technique MET to correct R anterior rotation of pelvis on sacrum followed by pelvic shotgun - algnment appears normalized following MET                  PT Education - 03/26/20 0938    Education Details HEP update - McKenzie extension progression; rolling pin self-STM    Person(s) Educated Patient    Methods Explanation;Demonstration;Verbal cues;Handout    Comprehension Verbalized understanding;Verbal cues required;Returned demonstration;Need further instruction            PT Short Term Goals - 03/26/20 0856      PT SHORT TERM GOAL #4   Title Pt will notice centralization of pain in Rt LE with exercises and ADLs    Status Achieved   32/27/38            PT Long Term Goals - 03/26/20 0858      PT LONG TERM GOAL #1   Title independent with advanced  HEP    Status On-going    Target Date 04/08/20      PT LONG TERM GOAL #5   Title Pt will be able to walk without a limp most of the time, pain in back, hip < 3/10    Status On-going    Target Date 04/08/20      PT LONG TERM GOAL #6   Title Pt will be able to increase Rt LE strength to 4/5 for greater stability with gait, standing    Status On-going    Target Date 04/08/20      PT LONG TERM GOAL #7   Title Pt will be able to stand and walk for 20 min with no more than min pain in Rt back, hip.    Status On-going    Target Date 04/08/20                 Plan - 03/26/20 0940    Clinical Impression Statement Allisa reports pain intensity seems to be lessening recently and radicular pain beginning to centralize with pain now more only to calf level although still noting numbness in her toes. She notes greatest  benefit for STM/MFR including self-STM using ball on wall at home and prone lying, therefore provided instruction in further options for self-STM using rolling pin as well as McKenzie extension  progression in prone. Given ongoing increased muscle tension/tightness/ttp, she may benefit from further DN in future visits. She continues to demonstrate significant proximal weakness with MMT somewhat limited by positional tolerance and guarding due to pain. STG for back now met but LTGs remain ongoing. Referral for plantar fasciitis/chronic foot pain not formally addressed at this point due to pt wanting to focus on back pain and lumbar radiculopathy due to severity of pain.    Comorbidities Diabetes, pelvic , OA and ankle fracture    Rehab Potential Good    PT Treatment/Interventions ADLs/Self Care Home Management;Biofeedback;Cryotherapy;Electrical Stimulation;Iontophoresis 4mg /ml Dexamethasone;Moist Heat;Ultrasound;Gait training;Therapeutic activities;Therapeutic exercise;Manual techniques;Patient/family education;Neuromuscular re-education;Passive range of motion;Dry needling;Spinal Manipulations;Joint Manipulations    PT Next Visit Plan continue STM/DTM to lumbar spine & R anterior/lateral hip; LE flexibility; lumbopelvic strengthening; modalities as indicated    PT Home Exercise Plan PELVIC : [id number].Marland Kitchen BACK: Prone prop, press up and Tr A, sidelying Rt trunk; 10/11 (BACK) - supine KTOS, seated hip flexor stretch; 10/25 - modified piri stretch, bridge, ball massage to glutes on wall, seated march; 10/28 - McKenzie extension progression; rolling pin self-STM    Consulted and Agree with Plan of Care Patient           Patient will benefit from skilled therapeutic intervention in order to improve the following deficits and impairments:  Decreased activity tolerance, Decreased strength, Increased fascial restricitons, Pain, Decreased mobility, Increased muscle spasms, Decreased range of motion, Decreased coordination, Obesity, Postural dysfunction, Impaired flexibility, Impaired sensation, Difficulty walking  Visit Diagnosis: Muscle weakness (generalized)  Pain in right hip  Stiffness of right  hip, not elsewhere classified  Cramp and spasm     Problem List Patient Active Problem List   Diagnosis Date Noted  . Impingement syndrome of left shoulder 01/23/2018    Percival Spanish, PT, MPT 03/26/2020, 12:43 PM  [facility] [address]  [place], [place], [address] Phone: [phone]   Fax:  [phone]  Name: Siddalee Vanderheiden MRN: [id number] Date of Birth: 05/13/1966

## 2020-03-26 NOTE — Patient Instructions (Signed)
    Home exercise program created by Fortunato Nordin, PT.  For questions, please contact Pervis Macintyre via phone at [phone] or email at [email or web address]  [facility] [place] [address]  [address] [place], [place], [address] Phone: [phone]   Fax:  [phone]    

## 2020-03-27 ENCOUNTER — Telehealth: Payer: Self-pay | Admitting: Nurse Practitioner

## 2020-03-27 NOTE — Telephone Encounter (Signed)
Incoming fax received today regarding disability.

## 2020-03-30 ENCOUNTER — Other Ambulatory Visit: Payer: Self-pay

## 2020-03-30 ENCOUNTER — Ambulatory Visit: Payer: BLUE CROSS/BLUE SHIELD | Attending: Podiatry

## 2020-03-30 DIAGNOSIS — M25651 Stiffness of right hip, not elsewhere classified: Secondary | ICD-10-CM | POA: Diagnosis present

## 2020-03-30 DIAGNOSIS — M25551 Pain in right hip: Secondary | ICD-10-CM | POA: Diagnosis present

## 2020-03-30 DIAGNOSIS — M6281 Muscle weakness (generalized): Secondary | ICD-10-CM | POA: Diagnosis not present

## 2020-03-30 DIAGNOSIS — R252 Cramp and spasm: Secondary | ICD-10-CM | POA: Diagnosis present

## 2020-03-30 NOTE — Therapy (Signed)
[facility] [place] [address]  [place], [place], [address] Phone: [phone]   Fax:  [phone]  Physical Therapy Treatment  Patient Details  Name: Ann Foster MRN: [id number] Date of Birth: 09/14/1965 Referring Provider (PT): Alda Berthold, DO & Trula Slade, DPM (Dr. Posey Pronto - LBP/radiculopathy; Dr. Jacqualyn Posey - foot pain)   Encounter Date: 03/30/2020   PT End of Session - 03/30/20 0808    Visit Number 21   11th for back   Number of Visits 30    Date for PT Re-Evaluation 05/05/20    Authorization Type BCBS    Authorization - Visit Number 21    Authorization - Number of Visits 30    PT Start Time 2500    PT Stop Time 0841    PT Time Calculation (min) 43 min    Activity Tolerance Patient limited by pain    Behavior During Therapy [facility] for tasks assessed/performed           Past Medical History:  Diagnosis Date  . Ankle fracture 2016   Right  . Aortic atherosclerosis (HCC)    trace calcific atherosclerosis aortic arch per ct neck done 12-22-17  . Bronchitis   . Diabetes mellitus without complication ([place])   . Hypertension   . OA (osteoarthritis) of shoulder    left shoulder, both knees arthritis    Past Surgical History:  Procedure Laterality Date  . NO PAST SURGERIES      There were no vitals filed for this visit.   Subjective Assessment - 03/30/20 0806    Subjective Pt. noting long weekend of driving to see friend who lost their mother.    Pertinent History h/o R ankle fracture, plantar fasciitis?, pelvic pain , diabetes, HTN    Diagnostic tests MRI 9/26    Patient Stated Goals reduce pain    Currently in Pain? Yes    Pain Score 9     Pain Location Back    Pain Orientation Right;Lower    Pain Descriptors / Indicators Sharp;Aching    Pain Type Chronic pain    Pain Radiating Towards Pain radiating toward R lateral hip    Pain Onset More than a month ago    Pain Frequency Constant     Aggravating Factors  sitting prolonged in car    Multiple Pain Sites No                             [facility] Adult PT Treatment/Exercise - 03/30/20 0001      Lumbar Exercises: Stretches   Lower Trunk Rotation Limitations 5" x 10 reps     Piriformis Stretch Right;30 seconds;1 rep    Piriformis Stretch Limitations Manual with therapist KTOS    Figure 4 Stretch 2 reps;30 seconds    Figure 4 Stretch Limitations Manual with therapist     Other Lumbar Stretch Exercise B hip adductor butterfly stretch manual with therapist x 30      Lumbar Exercises: Aerobic   Nustep L3 x 6 min      Manual Therapy   Manual Therapy Soft tissue mobilization;Myofascial release    Manual therapy comments supine     Soft tissue mobilization STM/DTM as tolerated to R TFL, R hip flexors, R prox RF, glute med, glute max, piriformis - very ttp throughout entire group     Myofascial Release Manual TPR to R hip flexors, piri, glute  med, R TFl    Muscle Energy Technique SIJ alignment appearing normal                     PT Short Term Goals - 03/26/20 0856      PT SHORT TERM GOAL #4   Title Pt will notice centralization of pain in Rt LE with exercises and ADLs    Status Achieved   03/26/20            PT Long Term Goals - 03/26/20 0858      PT LONG TERM GOAL #1   Title independent with advanced  HEP    Status On-going    Target Date 04/08/20      PT LONG TERM GOAL #5   Title Pt will be able to walk without a limp most of the time, pain in back, hip < 3/10    Status On-going    Target Date 04/08/20      PT LONG TERM GOAL #6   Title Pt will be able to increase Rt LE strength to 4/5 for greater stability with gait, standing    Status On-going    Target Date 04/08/20      PT LONG TERM GOAL #7   Title Pt will be able to stand and walk for 20 min with no more than min pain in Rt back, hip.    Status On-going    Target Date 04/08/20                 Plan - 03/30/20  0820    Clinical Impression Statement Ann Foster reporting she had to drive a few hours to see a friend over the weekend that had lost her mother.  notes this long car ride increased her pain.  session with continue MT focus to reduce tension and address palpable TPs in R lateral hip/hip flexor musculature.  Duration of session focused on manual LE stretching and gentle mobility activities for lumbar spine.  Pt. noting some relief from pain to end session however continues to be very ttp throughout lumbar back and hip musculature.  Encouraged pt. to continue LE strengthening and stretching activities at home along with self-massage to quads with roller pin.    Comorbidities Diabetes, pelvic , OA and ankle fracture    Rehab Potential Good    PT Frequency 2x / week   LBP   PT Duration 8 weeks    PT Treatment/Interventions ADLs/Self Care Home Management;Biofeedback;Cryotherapy;Electrical Stimulation;Iontophoresis 4mg /ml Dexamethasone;Moist Heat;Ultrasound;Gait training;Therapeutic activities;Therapeutic exercise;Manual techniques;Patient/family education;Neuromuscular re-education;Passive range of motion;Dry needling;Spinal Manipulations;Joint Manipulations    PT Next Visit Plan continue STM/DTM to lumbar spine & R anterior/lateral hip; LE flexibility; lumbopelvic strengthening; modalities as indicated    PT Home Exercise Plan PELVIC : [id number].Marland Kitchen BACK: Prone prop, press up and Tr A, sidelying Rt trunk; 10/11 (BACK) - supine KTOS, seated hip flexor stretch; 10/25 - modified piri stretch, bridge, ball massage to glutes on wall, seated march; 10/28 - McKenzie extension progression; rolling pin self-STM    Consulted and Agree with Plan of Care Patient           Patient will benefit from skilled therapeutic intervention in order to improve the following deficits and impairments:  Decreased activity tolerance, Decreased strength, Increased fascial restricitons, Pain, Decreased mobility, Increased muscle spasms,  Decreased range of motion, Decreased coordination, Obesity, Postural dysfunction, Impaired flexibility, Impaired sensation, Difficulty walking  Visit Diagnosis: Muscle weakness (generalized)  Pain in right hip  Stiffness of  right hip, not elsewhere classified  Cramp and spasm     Problem List Patient Active Problem List   Diagnosis Date Noted  . Impingement syndrome of left shoulder 01/23/2018    Bess Harvest, PTA 03/30/20 12:54 PM   [facility] [place] [address]  [place], [place], [address] Phone: [phone]   Fax:  [phone]  Name: Ann Foster MRN: [id number] Date of Birth: Mar 26, 1966

## 2020-04-02 ENCOUNTER — Ambulatory Visit: Payer: BLUE CROSS/BLUE SHIELD | Admitting: Physical Therapy

## 2020-04-02 ENCOUNTER — Other Ambulatory Visit: Payer: Self-pay

## 2020-04-02 ENCOUNTER — Encounter: Payer: Self-pay | Admitting: Physical Therapy

## 2020-04-02 DIAGNOSIS — R252 Cramp and spasm: Secondary | ICD-10-CM

## 2020-04-02 DIAGNOSIS — M6281 Muscle weakness (generalized): Secondary | ICD-10-CM | POA: Diagnosis not present

## 2020-04-02 DIAGNOSIS — M25551 Pain in right hip: Secondary | ICD-10-CM

## 2020-04-02 DIAGNOSIS — M25651 Stiffness of right hip, not elsewhere classified: Secondary | ICD-10-CM

## 2020-04-02 NOTE — Patient Instructions (Signed)
   Kinesiology tape  What is kinesiology tape?  There are many brands of kinesiology tape. KTape, Rock Tape, Body Sport, Dynamic tape, to name a few.  It is an elasticized tape designed to support the body's natural healing process. This tape provides stability and support to muscles and joints without restricting motion.  It can also help decrease swelling in the area of application.  How does it work?  The tape microscopically lifts and decompresses the skin to allow for drainage of lymph (swelling) to flow away from area, reducing inflammation. The tape has the ability to help re-educate the neuromuscular system by targeting specific receptors in the skin. The presence of the tape increases the body's awareness of posture and body mechanics.  Do not use with:  . Open wounds . Skin lesions . Adhesive allergies  In some rare cases, mild/moderate skin irritation can occur. This can include redness, itchiness, or hives. If this occurs, immediately remove tape and consult your primary care physician if symptoms are severe or do not resolve within 2 days.  Safe removal of the tape:  To remove tape safely, hold nearby skin with one hand and gentle roll tape down with other hand. You can apply oil or conditioner to tape while in shower prior to removal to loosen adhesive. DO NOT swiftly rip tape off like a band-aid, as this could cause skin tears and additional skin irritation.     For questions, please contact your therapist at:  [facility] [place] [address]  [address] [place], [place], [address] Phone: [phone]   Fax:  [phone]     

## 2020-04-02 NOTE — Therapy (Addendum)
[facility] [place] [address]  [place], [place], [address] Phone: [phone]   Fax:  [phone]  Physical Therapy Treatment  Patient Details  Name: Ann Foster MRN: [id number] Date of Birth: 27-Jul-1965 Referring Provider (PT): Alda Berthold, DO & Trula Slade, DPM (Dr. Posey Pronto - LBP/radiculopathy; Dr. Jacqualyn Posey - foot pain)   Encounter Date: 04/02/2020   PT End of Session - 04/02/20 0806    Visit Number 22   12th for back   Number of Visits 30    Date for PT Re-Evaluation 05/05/20    Authorization Type BCBS    Authorization - Visit Number 25    Authorization - Number of Visits 30    PT Start Time 0806    PT Stop Time 0846    PT Time Calculation (min) 40 min    Activity Tolerance Patient limited by pain    Behavior During Therapy [facility] for tasks assessed/performed           Past Medical History:  Diagnosis Date  . Ankle fracture 2016   Right  . Aortic atherosclerosis (HCC)    trace calcific atherosclerosis aortic arch per ct neck done 12-22-17  . Bronchitis   . Diabetes mellitus without complication ([place])   . Hypertension   . OA (osteoarthritis) of shoulder    left shoulder, both knees arthritis    Past Surgical History:  Procedure Laterality Date  . NO PAST SURGERIES      There were no vitals filed for this visit.   Subjective Assessment - 04/02/20 0811    Subjective Pt noting sensitivity to cold weather with more pain noted today. Able to get some relief with HEP stretches.    Pertinent History h/o R ankle fracture, plantar fasciitis?, pelvic pain , diabetes, HTN    Diagnostic tests MRI 9/26    Patient Stated Goals reduce pain    Currently in Pain? Yes    Pain Score 8     Pain Location Back   & R hip/buttock   Pain Orientation Lower;Right    Pain Descriptors / Indicators Sharp;Aching    Pain Type Chronic pain    Pain Radiating Towards pain/tightness down R LE in to calf    Pain Frequency  Constant                             [facility] Adult PT Treatment/Exercise - 04/02/20 0806      Lumbar Exercises: Stretches   Prone on Elbows Stretch 3 reps;30 seconds    Press Ups 2 reps;5 seconds      Lumbar Exercises: Aerobic   Nustep L3 x 6 min      Manual Therapy   Manual Therapy Joint mobilization;Soft tissue mobilization;Myofascial release;Taping    Manual therapy comments skilled palpation and monitoring during DN    Joint Mobilization Lumbar CPAs in prone & POE    Soft tissue mobilization STM/DTM to B lumbar paraspinals & R glutes/piriformis -very ttp with limited tolerance    Myofascial Release manual TPR to R upper glutes; pin & stretch to lumbar paraspinals - poor tolerance    Kinesiotex Inhibit Muscle      Kinesiotix   Inhibit Muscle  B lumbar paraspinals 30-50% from distal sacrum to lower thoracic + 50% perpendicular strip at level of SIJ             Trigger Point Dry Needling - 04/02/20  0806    Consent Given? Yes    Muscles Treated Back/Hip Gluteus minimus;Gluteus medius    Electrical Stimulation Performed with Dry Needling Yes    E-stim with Dry Needling Details R glutes    Gluteus Minimus Response Twitch response elicited;Palpable increased muscle length    Gluteus Medius Response Twitch response elicited;Palpable increased muscle length                PT Education - 04/02/20 0845    Education Details Role of kinesiotape and wearing/removal instructions    Person(s) Educated Patient    Methods Explanation;Demonstration;Handout    Comprehension Verbalized understanding            PT Short Term Goals - 03/26/20 0856      PT SHORT TERM GOAL #4   Title Pt will notice centralization of pain in Rt LE with exercises and ADLs    Status Achieved   03/26/20            PT Long Term Goals - 04/02/20 1728      PT LONG TERM GOAL #1   Title independent with advanced  HEP    Status On-going    Target Date 05/05/20      PT LONG  TERM GOAL #5   Title Pt will be able to walk without a limp most of the time, pain in back, hip < 3/10    Status On-going    Target Date 05/05/20      PT LONG TERM GOAL #6   Title Pt will be able to increase Rt LE strength to 4/5 for greater stability with gait, standing    Status On-going    Target Date 05/05/20      PT LONG TERM GOAL #7   Title Pt will be able to stand and walk for 20 min with no more than min pain in Rt back, hip.    Status On-going    Target Date 05/05/20                 Plan - 04/02/20 0846    Clinical Impression Statement Ann Foster reports pain seems to be worse (more intense) today with colder weather but radicular pain still only to calf rather than foot. She notes the greatest relief at home from self-STM and stretching but remains very tight and ttp t/o R glute and B lumbar paraspinals. Given report of benefit from DN on prior visits, session focusing on MT incorporating DN +/- estim as well as lumbar mobilizations. Limited tolerance for lumbar mobs with increased pain reported but pt noting some benefit with decreased ttp following DN and less limp noted as pt walking out of clinic. Initiated trial of kinesiotaping to B lumbar paraspinals in attempt to allow for improved muscle relaxation and will assess response next visit - if benefit noted may also consider taping for R glutes.    Comorbidities Diabetes, pelvic , OA and ankle fracture    Rehab Potential Good    PT Frequency 2x / week   LBP   PT Duration 8 weeks    PT Treatment/Interventions ADLs/Self Care Home Management;Biofeedback;Cryotherapy;Electrical Stimulation;Iontophoresis 4mg /ml Dexamethasone;Moist Heat;Ultrasound;Gait training;Therapeutic activities;Therapeutic exercise;Manual techniques;Patient/family education;Neuromuscular re-education;Passive range of motion;Dry needling;Spinal Manipulations;Joint Manipulations    PT Next Visit Plan assess response to taping; LE flexibility; lumbopelvic  strengthening; STM/DTM to lumbar spine & R anterior/lateral hip; modalities as indicated    PT Home Exercise Plan PELVIC : [id number].Marland Kitchen BACK: Prone prop, press up and Tr A, sidelying Rt  trunk; 10/11 (BACK) - supine KTOS, seated hip flexor stretch; 10/25 - modified piri stretch, bridge, ball massage to glutes on wall, seated march; 10/28 - McKenzie extension progression; rolling pin self-STM    Consulted and Agree with Plan of Care Patient           Patient will benefit from skilled therapeutic intervention in order to improve the following deficits and impairments:  Decreased activity tolerance, Decreased strength, Increased fascial restricitons, Pain, Decreased mobility, Increased muscle spasms, Decreased range of motion, Decreased coordination, Obesity, Postural dysfunction, Impaired flexibility, Impaired sensation, Difficulty walking  Visit Diagnosis: Muscle weakness (generalized)  Pain in right hip  Stiffness of right hip, not elsewhere classified  Cramp and spasm     Problem List Patient Active Problem List   Diagnosis Date Noted  . Impingement syndrome of left shoulder 01/23/2018    Percival Spanish, PT, MPT 04/02/2020, 6:31 PM  [facility] [address]  [place], [place], [address] Phone: [phone]   Fax:  [phone]  Name: Ann Foster MRN: [id number] Date of Birth: [birth year]

## 2020-04-06 ENCOUNTER — Other Ambulatory Visit: Payer: Self-pay

## 2020-04-06 ENCOUNTER — Ambulatory Visit: Payer: BLUE CROSS/BLUE SHIELD

## 2020-04-06 DIAGNOSIS — M6281 Muscle weakness (generalized): Secondary | ICD-10-CM

## 2020-04-06 DIAGNOSIS — M25551 Pain in right hip: Secondary | ICD-10-CM

## 2020-04-06 DIAGNOSIS — M25651 Stiffness of right hip, not elsewhere classified: Secondary | ICD-10-CM

## 2020-04-06 DIAGNOSIS — R252 Cramp and spasm: Secondary | ICD-10-CM

## 2020-04-06 NOTE — Therapy (Signed)
[facility] [address]  [place], [place], [address] Phone: [phone]   Fax:  [phone]  Physical Therapy Treatment  Patient Details  Name: Ann Foster MRN: [id number] Date of Birth: 12-Dec-1965 Referring Provider (PT): Alda Berthold, DO & Trula Slade, DPM (Dr. Posey Pronto - LBP/radiculopathy; Dr. Jacqualyn Posey - foot pain)   Encounter Date: 04/06/2020   PT End of Session - 04/06/20 0809    Visit Number 23   13th visit for LBP   Number of Visits 30    Date for PT Re-Evaluation 05/05/20    Authorization Type BCBS    Authorization - Visit Number 23    Authorization - Number of Visits 30    PT Start Time 8242    PT Stop Time 0837    PT Time Calculation (min) 38 min    Activity Tolerance Patient limited by pain    Behavior During Therapy [facility] for tasks assessed/performed           Past Medical History:  Diagnosis Date  . Ankle fracture 2016   Right  . Aortic atherosclerosis (HCC)    trace calcific atherosclerosis aortic arch per ct neck done 12-22-17  . Bronchitis   . Diabetes mellitus without complication ([place])   . Hypertension   . OA (osteoarthritis) of shoulder    left shoulder, both knees arthritis    Past Surgical History:  Procedure Laterality Date  . NO PAST SURGERIES      There were no vitals filed for this visit.   Subjective Assessment - 04/06/20 0802    Subjective Pt. noting, "The roller that I use at home is helping to reduce my leg spasms".    Pertinent History h/o R ankle fracture, plantar fasciitis?, pelvic pain , diabetes, HTN    Diagnostic tests MRI 9/26    Patient Stated Goals reduce pain    Currently in Pain? Yes    Pain Score 7     Pain Location Back    Pain Orientation Lower;Right    Pain Radiating Towards pain/tightness down R LE into calf    Pain Onset More than a month ago    Pain Frequency Constant    Multiple Pain Sites No                              [facility] Adult PT Treatment/Exercise - 04/06/20 0001      Lumbar Exercises: Aerobic   Nustep L4 x 6 min (UE/LE)      Lumbar Exercises: Seated   Sit to Stand 10 reps    Sit to Stand Limitations using hands as little as possible       Knee/Hip Exercises: Standing   Lateral Step Up Right;10 reps;Step Height: 4";Hand Hold: 2    Lateral Step Up Limitations cues for R LE effort     Forward Step Up Right;10 reps;Step Height: 4";Hand Hold: 1    Forward Step Up Limitations cues for R LE effort       Knee/Hip Exercises: Seated   Clamshell with TheraBand Yellow   looped TB at knees    Marching Right;Left;10 reps    Marching Limitations march with yellow TB at knees       Manual Therapy   Manual Therapy Taping    Kinesiotex Inhibit Muscle      Kinesiotix   Inhibit Muscle  B lumbar paraspinals 30-50% from distal sacrum  to lower thoracic + 50% perpendicular strip at level of SIJ                     PT Short Term Goals - 03/26/20 0856      PT SHORT TERM GOAL #4   Title Pt will notice centralization of pain in Rt LE with exercises and ADLs    Status Achieved   03/26/20            PT Long Term Goals - 04/02/20 1728      PT LONG TERM GOAL #1   Title independent with advanced  HEP    Status On-going    Target Date 05/05/20      PT LONG TERM GOAL #5   Title Pt will be able to walk without a limp most of the time, pain in back, hip < 3/10    Status On-going    Target Date 05/05/20      PT LONG TERM GOAL #6   Title Pt will be able to increase Rt LE strength to 4/5 for greater stability with gait, standing    Status On-going    Target Date 05/05/20      PT LONG TERM GOAL #7   Title Pt will be able to stand and walk for 20 min with no more than min pain in Rt back, hip.    Status On-going    Target Date 05/05/20                 Plan - 04/06/20 0834    Clinical Impression Statement Amadi noting some pain relief from lumbar  taping applied last session.  Continues to high subjective pain report of 7/10 to start session which seems to remain unchanged throughout session.  Did reapply lumbar K-taping pattern without skin irritation noted.  Progressed LE/hip strengthening for duration of session to pt. tolerance.  Will monitor response to standing therex and continue to progress strengthening activities per tolerance.    Comorbidities Diabetes, pelvic , OA and ankle fracture    Rehab Potential Good    PT Frequency 2x / week    PT Duration 8 weeks    PT Treatment/Interventions ADLs/Self Care Home Management;Biofeedback;Cryotherapy;Electrical Stimulation;Iontophoresis 4mg /ml Dexamethasone;Moist Heat;Ultrasound;Gait training;Therapeutic activities;Therapeutic exercise;Manual techniques;Patient/family education;Neuromuscular re-education;Passive range of motion;Dry needling;Spinal Manipulations;Joint Manipulations    PT Next Visit Plan LE flexibility; lumbopelvic strengthening; STM/DTM to lumbar spine & R anterior/lateral hip; modalities as indicated    PT Home Exercise Plan PELVIC : [id number].Marland Kitchen BACK: Prone prop, press up and Tr A, sidelying Rt trunk; 10/11 (BACK) - supine KTOS, seated hip flexor stretch; 10/25 - modified piri stretch, bridge, ball massage to glutes on wall, seated march; 10/28 - McKenzie extension progression; rolling pin self-STM    Consulted and Agree with Plan of Care Patient           Patient will benefit from skilled therapeutic intervention in order to improve the following deficits and impairments:  Decreased activity tolerance, Decreased strength, Increased fascial restricitons, Pain, Decreased mobility, Increased muscle spasms, Decreased range of motion, Decreased coordination, Obesity, Postural dysfunction, Impaired flexibility, Impaired sensation, Difficulty walking  Visit Diagnosis: Muscle weakness (generalized)  Pain in right hip  Stiffness of right hip, not elsewhere classified  Cramp and  spasm     Problem List Patient Active Problem List   Diagnosis Date Noted  . Impingement syndrome of left shoulder 01/23/2018    Bess Harvest, PTA 04/06/20 12:19 PM   [facility] [address]  [place], [place], [address] Phone: [phone]   Fax:  [phone]  Name: Janique Hoefer MRN: [id number] Date of Birth: 06/30/1965

## 2020-04-06 NOTE — Telephone Encounter (Signed)
Form is received. PCP will review it.

## 2020-04-07 ENCOUNTER — Encounter: Payer: No Typology Code available for payment source | Admitting: Physical Therapy

## 2020-04-09 ENCOUNTER — Ambulatory Visit: Payer: BLUE CROSS/BLUE SHIELD

## 2020-04-09 ENCOUNTER — Other Ambulatory Visit: Payer: Self-pay

## 2020-04-09 DIAGNOSIS — M6281 Muscle weakness (generalized): Secondary | ICD-10-CM | POA: Diagnosis not present

## 2020-04-09 DIAGNOSIS — R252 Cramp and spasm: Secondary | ICD-10-CM

## 2020-04-09 DIAGNOSIS — M25651 Stiffness of right hip, not elsewhere classified: Secondary | ICD-10-CM

## 2020-04-09 DIAGNOSIS — M25551 Pain in right hip: Secondary | ICD-10-CM

## 2020-04-09 NOTE — Telephone Encounter (Signed)
Ann Foster, from [employer], calling to have an update for the pts forms. Please advise.      [phone]

## 2020-04-09 NOTE — Telephone Encounter (Signed)
Please f/u with pt on the form. TKS

## 2020-04-09 NOTE — Telephone Encounter (Signed)
PCP received the form. Will fax once completed.

## 2020-04-09 NOTE — Therapy (Signed)
[facility] [address]  [place], [place], [address] Phone: [phone]   Fax:  [phone]  Physical Therapy Treatment  Patient Details  Name: Ann Foster MRN: [id number] Date of Birth: [birth year] Referring Provider (PT): Alda Berthold, DO & Trula Slade, DPM (Dr. Posey Pronto - LBP/radiculopathy; Dr. Jacqualyn Posey - foot pain)   Encounter Date: 04/09/2020   PT End of Session - 04/09/20 0824    Visit Number 24   14th for LBP   Number of Visits 30    Date for PT Re-Evaluation 05/05/20    Authorization Type BCBS    Authorization - Visit Number 24    Authorization - Number of Visits 30    PT Start Time 0800    PT Stop Time 9518    PT Time Calculation (min) 57 min    Activity Tolerance Patient limited by pain    Behavior During Therapy [facility] for tasks assessed/performed           Past Medical History:  Diagnosis Date  . Ankle fracture 2016   Right  . Aortic atherosclerosis (HCC)    trace calcific atherosclerosis aortic arch per ct neck done 12-22-17  . Bronchitis   . Diabetes mellitus without complication ([place])   . Hypertension   . OA (osteoarthritis) of shoulder    left shoulder, both knees arthritis    Past Surgical History:  Procedure Laterality Date  . NO PAST SURGERIES      There were no vitals filed for this visit.   Subjective Assessment - 04/09/20 0806    Subjective Pt. noting taping has helped pain levels since being applied last session.    Pertinent History h/o R ankle fracture, plantar fasciitis?, pelvic pain , diabetes, HTN    Diagnostic tests MRI 9/26    Patient Stated Goals reduce pain    Currently in Pain? Yes    Pain Score 7     Pain Location Back    Pain Orientation Lower;Right    Pain Descriptors / Indicators Aching                             [facility] Adult PT Treatment/Exercise - 04/09/20 0001      Lumbar Exercises: Stretches   Gastroc Stretch Right;Left;1  rep;20 seconds    Gastroc Stretch Limitations leaning into TM rail       Lumbar Exercises: Aerobic   Nustep L4 x 6 min (UE/LE)      Lumbar Exercises: Standing   Functional Squats 10 reps;3 seconds    Functional Squats Limitations holding TM rail       Knee/Hip Exercises: Standing   Lateral Step Up Right;Step Height: 4";Hand Hold: 2   x 12 reps    Lateral Step Up Limitations cues for R LE effort     Forward Step Up Right;Step Height: 4";Hand Hold: 1   x 12 reps    Forward Step Up Limitations cues for R LE effort     Other Standing Knee Exercises B forward 6" step up x 5 with 1 UE support       Knee/Hip Exercises: Seated   Clamshell with TheraBand Red   x 10 reps     Moist Heat Therapy   Number Minutes Moist Heat 15 Minutes    Moist Heat Location Lumbar Spine;Hip      Electrical Stimulation   Electrical Stimulation Location Low back &  R glutes    Electrical Stimulation Action IFC    Electrical Stimulation Parameters 80-150Hz , intensity to pt. tolerance, 15'    Electrical Stimulation Goals Pain;Tone      Manual Therapy   Manual Therapy Taping    Kinesiotex Inhibit Muscle      Kinesiotix   Inhibit Muscle  B lumbar paraspinals 30-50% from distal sacrum to lower thoracic + 50% perpendicular strip at level of SIJ                   PT Education - 04/09/20 0901    Education Details HEP update    Person(s) Educated Patient    Methods Explanation;Demonstration;Verbal cues;Handout    Comprehension Verbalized understanding;Returned demonstration;Verbal cues required            PT Short Term Goals - 03/26/20 0856      PT SHORT TERM GOAL #4   Title Pt will notice centralization of pain in Rt LE with exercises and ADLs    Status Achieved   03/26/20            PT Long Term Goals - 04/02/20 1728      PT LONG TERM GOAL #1   Title independent with advanced  HEP    Status On-going    Target Date 05/05/20      PT LONG TERM GOAL #5   Title Pt will be able to walk  without a limp most of the time, pain in back, hip < 3/10    Status On-going    Target Date 05/05/20      PT LONG TERM GOAL #6   Title Pt will be able to increase Rt LE strength to 4/5 for greater stability with gait, standing    Status On-going    Target Date 05/05/20      PT LONG TERM GOAL #7   Title Pt will be able to stand and walk for 20 min with no more than min pain in Rt back, hip.    Status On-going    Target Date 05/05/20                 Plan - 04/09/20 0826    Clinical Impression Statement Quinlyn noting increased LBP for 1 hour after last session.  Was able to tolerate progression in repetitions and step-height in step training for LE strengthening without increased pain today.  Re-applied K-taping to lumbar spine for continued support as pt. noting pain relief with this since last session.  Did end session with increased LBP thus applied E-stim/moist heat to R hip/lumbar spine for pain relief with good relief noted.    Comorbidities Diabetes, pelvic , OA and ankle fracture    Rehab Potential Good    PT Frequency 2x / week    PT Duration 8 weeks    PT Treatment/Interventions ADLs/Self Care Home Management;Biofeedback;Cryotherapy;Electrical Stimulation;Iontophoresis 4mg /ml Dexamethasone;Moist Heat;Ultrasound;Gait training;Therapeutic activities;Therapeutic exercise;Manual techniques;Patient/family education;Neuromuscular re-education;Passive range of motion;Dry needling;Spinal Manipulations;Joint Manipulations    PT Next Visit Plan LE flexibility; lumbopelvic strengthening; STM/DTM to lumbar spine & R anterior/lateral hip; modalities as indicated    PT Home Exercise Plan PELVIC : [id number].Marland Kitchen BACK: Prone prop, press up and Tr A, sidelying Rt trunk; 10/11 (BACK) - supine KTOS, seated hip flexor stretch; 10/25 - modified piri stretch, bridge, ball massage to glutes on wall, seated march; 10/28 - McKenzie extension progression; rolling pin self-STM; 04/09/20 - forward step up,  calf stretch    Consulted and Agree with Plan of  Care Patient           Patient will benefit from skilled therapeutic intervention in order to improve the following deficits and impairments:  Decreased activity tolerance, Decreased strength, Increased fascial restricitons, Pain, Decreased mobility, Increased muscle spasms, Decreased range of motion, Decreased coordination, Obesity, Postural dysfunction, Impaired flexibility, Impaired sensation, Difficulty walking  Visit Diagnosis: Muscle weakness (generalized)  Pain in right hip  Stiffness of right hip, not elsewhere classified  Cramp and spasm     Problem List Patient Active Problem List   Diagnosis Date Noted  . Impingement syndrome of left shoulder 01/23/2018    Bess Harvest, PTA 04/09/20 9:01 AM   [facility] [address]  [place], [place], [address] Phone: [phone]   Fax:  [phone]  Name: Raniyah Curenton MRN: [id number] Date of Birth: 08/26/1965

## 2020-04-13 ENCOUNTER — Encounter: Payer: Self-pay | Admitting: Physical Therapy

## 2020-04-13 ENCOUNTER — Ambulatory Visit: Payer: BLUE CROSS/BLUE SHIELD | Admitting: Physical Therapy

## 2020-04-13 ENCOUNTER — Other Ambulatory Visit: Payer: Self-pay

## 2020-04-13 DIAGNOSIS — R252 Cramp and spasm: Secondary | ICD-10-CM

## 2020-04-13 DIAGNOSIS — M25651 Stiffness of right hip, not elsewhere classified: Secondary | ICD-10-CM

## 2020-04-13 DIAGNOSIS — M6281 Muscle weakness (generalized): Secondary | ICD-10-CM | POA: Diagnosis not present

## 2020-04-13 DIAGNOSIS — M25551 Pain in right hip: Secondary | ICD-10-CM

## 2020-04-13 NOTE — Therapy (Signed)
[facility] [address]  [place], [place], [address] Phone: [phone]   Fax:  [phone]  Physical Therapy Treatment  Patient Details  Name: Ann Foster MRN: [id number] Date of Birth: [birth year] Referring Provider (PT): Alda Berthold, DO & Trula Slade, DPM (Dr. Posey Pronto - LBP/radiculopathy; Dr. Jacqualyn Posey - foot pain)   Encounter Date: 04/13/2020   PT End of Session - 04/13/20 0848    Visit Number 25   15th for LBP   Number of Visits 30    Date for PT Re-Evaluation 05/05/20    Authorization Type BCBS    Authorization - Visit Number 25    Authorization - Number of Visits 30    PT Start Time 0848    PT Stop Time 0928    PT Time Calculation (min) 40 min    Activity Tolerance Patient limited by pain;Patient tolerated treatment well    Behavior During Therapy [facility] for tasks assessed/performed           Past Medical History:  Diagnosis Date   Ankle fracture 2016   Right   Aortic atherosclerosis ([place])    trace calcific atherosclerosis aortic arch per ct neck done 12-22-17   Bronchitis    Diabetes mellitus without complication (HCC)    Hypertension    OA (osteoarthritis) of shoulder    left shoulder, both knees arthritis    Past Surgical History:  Procedure Laterality Date   NO PAST SURGERIES      There were no vitals filed for this visit.   Subjective Assessment - 04/13/20 0854    Subjective Pt reports her R leg gave put on her yesterday and she fell while walking into a store - landed on her R knee, but denies any injury.    Pertinent History h/o R ankle fracture, plantar fasciitis?, pelvic pain , diabetes, HTN    Diagnostic tests MRI 9/26    Patient Stated Goals reduce pain    Currently in Pain? Yes    Pain Score 6     Pain Location Back    Pain Orientation Lower;Right    Pain Descriptors / Indicators Aching    Pain Type Chronic pain    Pain Radiating Towards pain/tightness into R  thigh    Pain Frequency Constant    Pain Score 6    Pain Location Leg    Pain Orientation Right;Upper;Posterior;Lateral    Pain Frequency Constant                             [facility] Adult PT Treatment/Exercise - 04/13/20 0848      Lumbar Exercises: Aerobic   Nustep L4 x 6 min (UE/LE)      Manual Therapy   Manual Therapy Soft tissue mobilization;Myofascial release;Taping    Manual therapy comments skilled palpation and monitoring during DN    Soft tissue mobilization STM/DTM to R glutes/piriformis - very ttp with + jump sign & limited tolerance    Myofascial Release manual TPR to R upper glutes & piriformis - decreased ttp noted following DN    Kinesiotex Inhibit Muscle      Kinesiotix   Inhibit Muscle  B lumbar paraspinals 30-50% from distal sacrum to lower thoracic + 50% perpendicular strip at level of SIJ; R glutes - 30% glute medius pattern            Trigger Point Dry Needling - 04/13/20 0848  Consent Given? Yes    Muscles Treated Back/Hip Gluteus minimus;Gluteus medius;Gluteus maximus;Piriformis    Electrical Stimulation Performed with Dry Needling Yes    E-stim with Dry Needling Details R glutes & piriformis    Gluteus Minimus Response Twitch response elicited;Palpable increased muscle length    Gluteus Medius Response Twitch response elicited;Palpable increased muscle length    Gluteus Maximus Response Twitch response elicited;Palpable increased muscle length    Piriformis Response Twitch response elicited;Palpable increased muscle length                  PT Short Term Goals - 03/26/20 0856      PT SHORT TERM GOAL #4   Title Pt will notice centralization of pain in Rt LE with exercises and ADLs    Status Achieved   03/26/20            PT Long Term Goals - 04/02/20 1728      PT LONG TERM GOAL #1   Title independent with advanced  HEP    Status On-going    Target Date 05/05/20      PT LONG TERM GOAL #5   Title Pt will be able  to walk without a limp most of the time, pain in back, hip < 3/10    Status On-going    Target Date 05/05/20      PT LONG TERM GOAL #6   Title Pt will be able to increase Rt LE strength to 4/5 for greater stability with gait, standing    Status On-going    Target Date 05/05/20      PT LONG TERM GOAL #7   Title Pt will be able to stand and walk for 20 min with no more than min pain in Rt back, hip.    Status On-going    Target Date 05/05/20                 Plan - 04/13/20 4196    Clinical Impression Statement Ann Foster reports a fall over the weekend while walking into a store when her R leg gave way w/o warning causing her to go down on her knee before her son caught her - she denies any injury. She reports pain slightly better today with further centralization of pain - radicular pain extending only into mid thigh currently, but still noting increased muscle tension and ttp in R buttock. Addressed increased muscle tension and ttp with MT incorporating DN - muscles treated include R glute minimus/medius/maximus and piriformis. DN produced normal response with good twitches elicited resulting in palpable reduction in pain/ttp and muscle tension. Pt noting benefit from prior kinesiotaping, therefore reapplied to lumbar paraspinals as well as introduced taping for R glutes to promote further reduction in pain and muscle tension following DN. Pt declining estim and moist heat today.    Comorbidities Diabetes, pelvic , OA and ankle fracture    Rehab Potential Good    PT Frequency 2x / week    PT Duration 8 weeks    PT Treatment/Interventions ADLs/Self Care Home Management;Biofeedback;Cryotherapy;Electrical Stimulation;Iontophoresis 4mg /ml Dexamethasone;Moist Heat;Ultrasound;Gait training;Therapeutic activities;Therapeutic exercise;Manual techniques;Patient/family education;Neuromuscular re-education;Passive range of motion;Dry needling;Spinal Manipulations;Joint Manipulations    PT Next Visit  Plan assess response to taping for glutes; LE flexibility; lumbopelvic strengthening; STM/DTM to lumbar spine & R anterior/lateral hip; modalities as indicated; kinesiotaping as continued benefit noted    PT Home Exercise Plan PELVIC : [id number].Marland Kitchen BACK: Prone prop, press up and Tr A, sidelying Rt trunk; 10/11 (BACK) -  supine KTOS, seated hip flexor stretch; 10/25 - modified piri stretch, bridge, ball massage to glutes on wall, seated march; 10/28 - McKenzie extension progression; rolling pin self-STM; 04/09/20 - forward step up, calf stretch    Consulted and Agree with Plan of Care Patient           Patient will benefit from skilled therapeutic intervention in order to improve the following deficits and impairments:  Decreased activity tolerance, Decreased strength, Increased fascial restricitons, Pain, Decreased mobility, Increased muscle spasms, Decreased range of motion, Decreased coordination, Obesity, Postural dysfunction, Impaired flexibility, Impaired sensation, Difficulty walking  Visit Diagnosis: Muscle weakness (generalized)  Pain in right hip  Stiffness of right hip, not elsewhere classified  Cramp and spasm     Problem List Patient Active Problem List   Diagnosis Date Noted   Impingement syndrome of left shoulder 01/23/2018    Percival Spanish, PT, MPT 04/13/2020, 11:00 AM  [facility] [address]  [place], [place], [address] Phone: [phone]   Fax:  [phone]  Name: Ann Foster MRN: [id number] Date of Birth: 07/06/1965

## 2020-04-16 ENCOUNTER — Other Ambulatory Visit: Payer: Self-pay

## 2020-04-16 ENCOUNTER — Ambulatory Visit: Payer: BLUE CROSS/BLUE SHIELD

## 2020-04-16 DIAGNOSIS — M6281 Muscle weakness (generalized): Secondary | ICD-10-CM

## 2020-04-16 DIAGNOSIS — R252 Cramp and spasm: Secondary | ICD-10-CM

## 2020-04-16 DIAGNOSIS — M25551 Pain in right hip: Secondary | ICD-10-CM

## 2020-04-16 DIAGNOSIS — M25651 Stiffness of right hip, not elsewhere classified: Secondary | ICD-10-CM

## 2020-04-16 NOTE — Therapy (Signed)
[facility] [address]  [place], [place], [address] Phone: [phone]   Fax:  [phone]  Physical Therapy Treatment  Patient Details  Name: Ann Foster MRN: [id number] Date of Birth: [birth year] Referring Provider (PT): Alda Berthold, DO & Trula Slade, DPM (Dr. Posey Pronto - LBP/radiculopathy; Dr. Jacqualyn Posey - foot pain)   Encounter Date: 04/16/2020   PT End of Session - 04/16/20 0809    Visit Number 26   16th for LBP   Number of Visits 30    Date for PT Re-Evaluation 05/05/20    Authorization Type BCBS    Authorization - Visit Number 26    Authorization - Number of Visits 30    PT Start Time 0800    PT Stop Time 0388    PT Time Calculation (min) 44 min    Activity Tolerance Patient limited by pain;Patient tolerated treatment well    Behavior During Therapy [facility] for tasks assessed/performed           Past Medical History:  Diagnosis Date  . Ankle fracture 2016   Right  . Aortic atherosclerosis (HCC)    trace calcific atherosclerosis aortic arch per ct neck done 12-22-17  . Bronchitis   . Diabetes mellitus without complication ([place])   . Hypertension   . OA (osteoarthritis) of shoulder    left shoulder, both knees arthritis    Past Surgical History:  Procedure Laterality Date  . NO PAST SURGERIES      There were no vitals filed for this visit.   Subjective Assessment - 04/16/20 0808    Subjective Pt. noting good benefit from taping last visit.    Pertinent History h/o R ankle fracture, plantar fasciitis?, pelvic pain , diabetes, HTN    Diagnostic tests MRI 9/26    Patient Stated Goals reduce pain    Currently in Pain? Yes    Pain Score 5     Pain Location Back    Pain Orientation Lower;Right    Pain Descriptors / Indicators Aching    Pain Type Chronic pain    Pain Radiating Towards pain/tightness into R thigh    Pain Onset More than a month ago    Pain Frequency Constant    Multiple  Pain Sites No                             [facility] Adult PT Treatment/Exercise - 04/16/20 0001      Lumbar Exercises: Stretches   Hip Flexor Stretch Right;2 reps;30 seconds    Hip Flexor Stretch Limitations sitting on chair       Lumbar Exercises: Aerobic   Nustep L4 x 7 min (UE/LE)      Lumbar Exercises: Standing   Functional Squats 10 reps;3 seconds    Functional Squats Limitations holding TM rail       Knee/Hip Exercises: Standing   Knee Flexion Right;10 reps    Knee Flexion Limitations holding TM    Lateral Step Up Right;10 reps;Hand Hold: 2;Step Height: 6"    Lateral Step Up Limitations cues for level hip position     Forward Step Up Right;10 reps;Step Height: 6";Hand Hold: 1    Forward Step Up Limitations intermittent cueing to avoid circumduction      Knee/Hip Exercises: Seated   Clamshell with TheraBand Red   x 15 reps    Marching Right;Left;10 reps    Marching Limitations  seated       Manual Therapy   Manual Therapy Passive ROM    Passive ROM R glute/piri manual stretch seated with therapist support 2 x 30sec                   PT Education - 04/16/20 0901    Education Details HEP update    Person(s) Educated Patient    Methods Explanation;Demonstration;Verbal cues;Handout    Comprehension Verbalized understanding;Returned demonstration;Verbal cues required            PT Short Term Goals - 03/26/20 0856      PT SHORT TERM GOAL #4   Title Pt will notice centralization of pain in Rt LE with exercises and ADLs    Status Achieved   03/26/20            PT Long Term Goals - 04/02/20 1728      PT LONG TERM GOAL #1   Title independent with advanced  HEP    Status On-going    Target Date 05/05/20      PT LONG TERM GOAL #5   Title Pt will be able to walk without a limp most of the time, pain in back, hip < 3/10    Status On-going    Target Date 05/05/20      PT LONG TERM GOAL #6   Title Pt will be able to increase Rt LE  strength to 4/5 for greater stability with gait, standing    Status On-going    Target Date 05/05/20      PT LONG TERM GOAL #7   Title Pt will be able to stand and walk for 20 min with no more than min pain in Rt back, hip.    Status On-going    Target Date 05/05/20                 Plan - 04/16/20 0810    Clinical Impression Statement Ann Foster able to report prolonged pain relief in R hip musculature after last session's DN and taping carrying over into today.  Ambulating into session with visibly improved gait mechanics and less hesitant hip flexion during swing phase of gait.  Majority of K-taping still intact from last session thus focused session on progression of LE/hip strengthening activities to tolerance.  HEP updated (see pt. education section) with activities well tolerated in session today.  Ann Foster noting initial 5/10 R-sided hip/back pain which did not significantly increased during standing therex today which was better tolerance for her than past visits.  We were able to progress forward/lateral step-up height and general intensity of strengthening activities.    Comorbidities Diabetes, pelvic , OA and ankle fracture    Rehab Potential Good    PT Frequency 2x / week    PT Duration 8 weeks    PT Treatment/Interventions ADLs/Self Care Home Management;Biofeedback;Cryotherapy;Electrical Stimulation;Iontophoresis 4mg /ml Dexamethasone;Moist Heat;Ultrasound;Gait training;Therapeutic activities;Therapeutic exercise;Manual techniques;Patient/family education;Neuromuscular re-education;Passive range of motion;Dry needling;Spinal Manipulations;Joint Manipulations    PT Next Visit Plan LE flexibility; lumbopelvic strengthening; STM/DTM to lumbar spine & R anterior/lateral hip; modalities as indicated; kinesiotaping as continued benefit noted    PT Home Exercise Plan PELVIC : [id number].Marland Kitchen BACK: Prone prop, press up and Tr A, sidelying Rt trunk; 10/11 (BACK) - supine KTOS, seated hip flexor  stretch; 10/25 - modified piri stretch, bridge, ball massage to glutes on wall, seated march; 10/28 - McKenzie extension progression; rolling pin self-STM; 04/09/20 - forward step up, calf stretch; 11/18 - counter squat,  seated TB clam shell (pt. already using green), standing HS curl    Consulted and Agree with Plan of Care Patient           Patient will benefit from skilled therapeutic intervention in order to improve the following deficits and impairments:  Decreased activity tolerance, Decreased strength, Increased fascial restricitons, Pain, Decreased mobility, Increased muscle spasms, Decreased range of motion, Decreased coordination, Obesity, Postural dysfunction, Impaired flexibility, Impaired sensation, Difficulty walking  Visit Diagnosis: Muscle weakness (generalized)  Pain in right hip  Stiffness of right hip, not elsewhere classified  Cramp and spasm     Problem List Patient Active Problem List   Diagnosis Date Noted  . Impingement syndrome of left shoulder 01/23/2018    Ann Foster, PTA 04/16/20 10:52 AM   [facility] [address]  [place], [place], [address] Phone: [phone]   Fax:  [phone]  Name: Ann Foster MRN: [id number] Date of Birth: April 15, 1966

## 2020-04-16 NOTE — Telephone Encounter (Signed)
Citizen disability called about status of paperwork / please fax to [phone]

## 2020-04-20 ENCOUNTER — Ambulatory Visit: Payer: BLUE CROSS/BLUE SHIELD | Admitting: Physical Therapy

## 2020-04-20 ENCOUNTER — Other Ambulatory Visit: Payer: Self-pay

## 2020-04-20 ENCOUNTER — Encounter: Payer: Self-pay | Admitting: Physical Therapy

## 2020-04-20 DIAGNOSIS — M25651 Stiffness of right hip, not elsewhere classified: Secondary | ICD-10-CM

## 2020-04-20 DIAGNOSIS — M25551 Pain in right hip: Secondary | ICD-10-CM

## 2020-04-20 DIAGNOSIS — M6281 Muscle weakness (generalized): Secondary | ICD-10-CM

## 2020-04-20 DIAGNOSIS — R252 Cramp and spasm: Secondary | ICD-10-CM

## 2020-04-20 NOTE — Therapy (Signed)
[facility] [address]  [place], [place], [address] Phone: [phone]   Fax:  [phone]  Physical Therapy Treatment  Patient Details  Name: Ann Foster MRN: [id number] Date of Birth: [birth year] Referring Provider (PT): Alda Berthold, DO & Trula Slade, DPM (Dr. Posey Pronto - LBP/radiculopathy; Dr. Jacqualyn Posey - foot pain)   Encounter Date: 04/20/2020   PT End of Session - 04/20/20 0805    Visit Number 27   17th for LBP   Number of Visits 30    Date for PT Re-Evaluation 05/05/20    Authorization Type BCBS    Authorization - Visit Number 27    Authorization - Number of Visits 30    PT Start Time 0805    PT Stop Time 0908    PT Time Calculation (min) 63 min    Activity Tolerance Patient limited by pain;Patient tolerated treatment well    Behavior During Therapy [facility] for tasks assessed/performed           Past Medical History:  Diagnosis Date  . Ankle fracture 2016   Right  . Aortic atherosclerosis (HCC)    trace calcific atherosclerosis aortic arch per ct neck done 12-22-17  . Bronchitis   . Diabetes mellitus without complication ([place])   . Hypertension   . OA (osteoarthritis) of shoulder    left shoulder, both knees arthritis    Past Surgical History:  Procedure Laterality Date  . NO PAST SURGERIES      There were no vitals filed for this visit.   Subjective Assessment - 04/20/20 0810    Subjective Pt reports increased pain today and over the weekend. Pain too intense for her to tolerate self-STM with ball on wall. Pt thinks pain seemed to get worse once Ktape removed.    Pertinent History h/o R ankle fracture, plantar fasciitis?, pelvic pain , diabetes, HTN    Diagnostic tests MRI 9/26    Patient Stated Goals reduce pain    Currently in Pain? Yes    Pain Score 8     Pain Location Buttocks    Pain Orientation Right    Pain Descriptors / Indicators Nagging;Aching;Burning    Pain Type Chronic  pain    Pain Radiating Towards pain into R calf    Pain Frequency Constant                             [facility] Adult PT Treatment/Exercise - 04/20/20 0805      Exercises   Exercises Lumbar      Lumbar Exercises: Aerobic   Nustep L4 x 4 min (UE/LE)   limited tolerance d/t pain     Modalities   Modalities Electrical Stimulation;Moist Heat      Moist Heat Therapy   Number Minutes Moist Heat 15 Minutes    Moist Heat Location Lumbar Spine;Hip   R buttocks     Electrical Stimulation   Electrical Stimulation Location Lumbar paraspinals & R glutes    Electrical Stimulation Action IFC    Electrical Stimulation Parameters 80-150Hz , intensity to pt tolerance x 15'    Electrical Stimulation Goals Pain;Tone      Manual Therapy   Manual Therapy Soft tissue mobilization;Myofascial release;Taping    Manual therapy comments skilled palpation and monitoring during DN    Soft tissue mobilization STM/DTM to R glutes/piriformis, medial & lateral R HS - very ttp with + jump sign &  limited tolerance    Myofascial Release manual TPR to R upper glutes & piriformis; pin & stretch to R HS - decreased ttp noted following DN    Kinesiotex Inhibit Muscle      Kinesiotix   Inhibit Muscle  B lumbar paraspinals 30-50% from distal sacrum to lower thoracic + 50% perpendicular strip at level of SIJ; R glutes - 30% glute medius pattern; R HS - 2 strips (medial & lateral HS) 30%-50%            Trigger Point Dry Needling - 04/20/20 0805    Consent Given? Yes    Muscles Treated Lower Quadrant Hamstring   Rt   Muscles Treated Back/Hip Gluteus minimus;Gluteus medius;Piriformis   Rt   Electrical Stimulation Performed with Dry Needling Yes    E-stim with Dry Needling Details R medial & lateral HS    Hamstring Response Twitch response elicited;Palpable increased muscle length    Gluteus Minimus Response Twitch response elicited;Palpable increased muscle length    Gluteus Medius Response Twitch  response elicited;Palpable increased muscle length    Piriformis Response Twitch response elicited;Palpable increased muscle length                  PT Short Term Goals - 03/26/20 0856      PT SHORT TERM GOAL #4   Title Pt will notice centralization of pain in Rt LE with exercises and ADLs    Status Achieved   03/26/20            PT Long Term Goals - 04/02/20 1728      PT LONG TERM GOAL #1   Title independent with advanced  HEP    Status On-going    Target Date 05/05/20      PT LONG TERM GOAL #5   Title Pt will be able to walk without a limp most of the time, pain in back, hip < 3/10    Status On-going    Target Date 05/05/20      PT LONG TERM GOAL #6   Title Pt will be able to increase Rt LE strength to 4/5 for greater stability with gait, standing    Status On-going    Target Date 05/05/20      PT LONG TERM GOAL #7   Title Pt will be able to stand and walk for 20 min with no more than min pain in Rt back, hip.    Status On-going    Target Date 05/05/20                 Plan - 04/20/20 0815    Clinical Impression Statement [name] reports good, prolonged relief from last DN session and kinesiotaping however pain increased over the weekend w/o identified trigger - pt reports no unusual activity and probably more sedentary than usual. Informed pt that excessive inactivity can lead to increased stiffness and associated pain. Addressed areas of increased pain with manual STM/DTM, TPR and DN with reduction in muscle tension and ttp noted. Encouraged pt to keep up with HEP and remain at least moderately active to help reduce likelihood of return of stiffness/pain. Session concluded with estim and moist heat upon pt request to promote further reduction in pain and stiffness. Pt noted less pain and able to walk with less of a limp upon leaving session.    Comorbidities Diabetes, pelvic , OA and ankle fracture    Rehab Potential Good    PT Frequency 2x / week  PT  Duration 8 weeks    PT Treatment/Interventions ADLs/Self Care Home Management;Biofeedback;Cryotherapy;Electrical Stimulation;Iontophoresis 4mg /ml Dexamethasone;Moist Heat;Ultrasound;Gait training;Therapeutic activities;Therapeutic exercise;Manual techniques;Patient/family education;Neuromuscular re-education;Passive range of motion;Dry needling;Spinal Manipulations;Joint Manipulations    PT Next Visit Plan HEP review update for LE flexibility & lumbopelvic strengthening as pt nearing end of POC; determine need for recert vs pt readiness to transiton to HEP; STM/DTM to lumbar spine & R anterior/lateral hip; modalities as indicated; kinesiotaping as continued benefit noted    PT Home Exercise Plan PELVIC : [id number].Marland Kitchen BACK: Prone prop, press up and Tr A, sidelying Rt trunk; 10/11 (BACK) - supine KTOS, seated hip flexor stretch; 10/25 - modified piri stretch, bridge, ball massage to glutes on wall, seated march; 10/28 - McKenzie extension progression; rolling pin self-STM; 04/09/20 - forward step up, calf stretch; 11/18 - counter squat, seated TB clam shell (pt. already using green), standing HS curl    Consulted and Agree with Plan of Care Patient           Patient will benefit from skilled therapeutic intervention in order to improve the following deficits and impairments:  Decreased activity tolerance, Decreased strength, Increased fascial restricitons, Pain, Decreased mobility, Increased muscle spasms, Decreased range of motion, Decreased coordination, Obesity, Postural dysfunction, Impaired flexibility, Impaired sensation, Difficulty walking  Visit Diagnosis: Muscle weakness (generalized)  Pain in right hip  Stiffness of right hip, not elsewhere classified  Cramp and spasm     Problem List Patient Active Problem List   Diagnosis Date Noted  . Impingement syndrome of left shoulder 01/23/2018    Percival Spanish, PT, MPT 04/20/2020, 12:01 PM  [facility] [address]  [place], [place], [address] Phone: [phone]   Fax:  [phone]  Name: Ann Foster MRN: [id number] Date of Birth: November 30, 1965

## 2020-04-21 ENCOUNTER — Encounter: Payer: No Typology Code available for payment source | Admitting: Physical Therapy

## 2020-04-21 ENCOUNTER — Telehealth: Payer: Self-pay | Admitting: Nurse Practitioner

## 2020-04-21 NOTE — Telephone Encounter (Signed)
Incoming fax received for patient's disability paperwork to be filled out. Placed paperwork in PCP box.

## 2020-04-22 ENCOUNTER — Encounter: Payer: Self-pay | Admitting: Obstetrics & Gynecology

## 2020-04-22 ENCOUNTER — Ambulatory Visit (INDEPENDENT_AMBULATORY_CARE_PROVIDER_SITE_OTHER): Payer: BLUE CROSS/BLUE SHIELD | Admitting: Obstetrics & Gynecology

## 2020-04-22 ENCOUNTER — Other Ambulatory Visit: Payer: Self-pay

## 2020-04-22 ENCOUNTER — Other Ambulatory Visit: Payer: Self-pay | Admitting: Obstetrics & Gynecology

## 2020-04-22 VITALS — BP 132/82 | HR 76 | Wt 195.7 lb

## 2020-04-22 DIAGNOSIS — N951 Menopausal and female climacteric states: Secondary | ICD-10-CM

## 2020-04-22 DIAGNOSIS — N952 Postmenopausal atrophic vaginitis: Secondary | ICD-10-CM | POA: Diagnosis not present

## 2020-04-22 MED ORDER — PROGESTERONE 200 MG PO CAPS: 200.0000 mg | ORAL_CAPSULE | Freq: Every day | ORAL | 3 refills | Status: DC

## 2020-04-22 MED ORDER — ESTRADIOL 0.0375 MG/24HR TD PTTW: 1.0000 | MEDICATED_PATCH | TRANSDERMAL | 12 refills | Status: DC

## 2020-04-22 MED FILL — ESTRADIOL PATCH 0.0375: 0.0375 | 28 days supply | Qty: 8 | Fill #0

## 2020-04-22 MED FILL — PROGESTERONE MICRONIZED 200: 200 | 30 days supply | Qty: 30 | Fill #0

## 2020-04-24 DIAGNOSIS — N952 Postmenopausal atrophic vaginitis: Secondary | ICD-10-CM | POA: Insufficient documentation

## 2020-04-24 DIAGNOSIS — N951 Menopausal and female climacteric states: Secondary | ICD-10-CM | POA: Insufficient documentation

## 2020-04-24 NOTE — Progress Notes (Signed)
   Subjective:    Patient ID: Ann Foster, female    DOB: [birth year], 54 y.o.   MRN: [id number]  HPI Pt here for f/u of painful intercourse and vaginal dryness.  Pt has done will withe pelvic PT.  She can't afford the intrarosa any longer.  Pt does have compliants of night sweats that disturb her sleep.  She is wanting medications.  She has tried sleep hygiene which has not helped.   Review of Systems  Constitutional:       Night sweats secondary to menopause  Respiratory: Negative.   Cardiovascular: Negative.   Gastrointestinal: Negative.   Genitourinary:       Vaginal dryness-much improved       Objective:   Physical Exam Vitals reviewed.  Constitutional:      General: She is not in acute distress.    Appearance: She is well-developed.  HENT:     Head: Normocephalic and atraumatic.  Eyes:     Conjunctiva/sclera: Conjunctivae normal.  Cardiovascular:     Rate and Rhythm: Normal rate.  Pulmonary:     Effort: Pulmonary effort is normal.  Skin:    General: Skin is warm and dry.  Neurological:     Mental Status: She is alert and oriented to person, place, and time.    Vitals:   04/22/20 1107  BP: 132/82  Pulse: 76  Weight: 195 lb 11.2 oz (88.8 kg)      Assessment & Plan:  54 yo female with menopausal night sweats and vaginal dryness.    Patient with bothersome menopausal vasomotor symptoms. Discussed lifestyle interventions such as wearing light clothing, remaining in cool environments, having fan/air conditioner in the room, avoiding hot beverages etc.  Discussed using hormone therapy and concerns about increased risk of heart disease, cerebrovascular disease, thromboembolic disease,  and breast cancer.  Also discussed other medical options such as Paxil, Effexor or Neurontin.  Given that she has vasomotor symptoms and vaginal dryness, she has elected for HRT.  RTC 3 months with video visit.   33 minutes spent with review or records, pt interveiw and exam,  counseling, and documentation.

## 2020-04-27 ENCOUNTER — Other Ambulatory Visit: Payer: Self-pay

## 2020-04-27 ENCOUNTER — Ambulatory Visit: Payer: BLUE CROSS/BLUE SHIELD

## 2020-04-27 DIAGNOSIS — M25651 Stiffness of right hip, not elsewhere classified: Secondary | ICD-10-CM

## 2020-04-27 DIAGNOSIS — R252 Cramp and spasm: Secondary | ICD-10-CM

## 2020-04-27 DIAGNOSIS — M6281 Muscle weakness (generalized): Secondary | ICD-10-CM

## 2020-04-27 DIAGNOSIS — M25551 Pain in right hip: Secondary | ICD-10-CM

## 2020-04-27 NOTE — Therapy (Signed)
[facility] [address]  [place], [place], [address] Phone: [phone]   Fax:  [phone]  Physical Therapy Treatment  Patient Details  Name: Ann Foster MRN: [id number] Date of Birth: [birth year] Referring Provider (PT): Alda Berthold, DO & Trula Slade, DPM (Dr. Posey Pronto - LBP/radiculopathy; Dr. Jacqualyn Posey - foot pain)   Encounter Date: 04/27/2020   PT End of Session - 04/27/20 0819    Visit Number 28   18th visit for LNP   Number of Visits 30    Date for PT Re-Evaluation 05/05/20    Authorization Type BCBS    Authorization - Visit Number 28    Authorization - Number of Visits 30    PT Start Time 0802    PT Stop Time 0900    PT Time Calculation (min) 58 min    Activity Tolerance Patient limited by pain;Patient tolerated treatment well    Behavior During Therapy [facility] for tasks assessed/performed           Past Medical History:  Diagnosis Date  . Ankle fracture 2016   Right  . Aortic atherosclerosis (HCC)    trace calcific atherosclerosis aortic arch per ct neck done 12-22-17  . Bronchitis   . Diabetes mellitus without complication ([place])   . Hypertension   . OA (osteoarthritis) of shoulder    left shoulder, both knees arthritis    Past Surgical History:  Procedure Laterality Date  . NO PAST SURGERIES      There were no vitals filed for this visit.   Subjective Assessment - 04/27/20 0816    Subjective Pt. noting increased R anterior hip pain after prolonged standing during Thanksgiving.    Pertinent History h/o R ankle fracture, plantar fasciitis?, pelvic pain , diabetes, HTN    Diagnostic tests MRI 9/26    Patient Stated Goals reduce pain    Currently in Pain? Yes    Pain Score 7     Pain Location Hip    Pain Orientation Right;Lateral;Anterior    Pain Descriptors / Indicators Burning;Aching;Nagging    Pain Type Chronic pain    Pain Radiating Towards pain into R calf    Pain Onset More  than a month ago    Pain Frequency Constant    Aggravating Factors  prolonged standing/cooking    Multiple Pain Sites No                             [facility] Adult PT Treatment/Exercise - 04/27/20 0001      Self-Care   Other Self-Care Comments  Review of comprehensive HEP to check for understanding; re-issued red looped TB per pt. request; discussed pt. nearing end of her current POC and pt. noting she feels she will be ready to transition to a home program and continue HEP on her own      Lumbar Exercises: Stretches   Hip Flexor Stretch Right;2 reps;30 seconds    Hip Flexor Stretch Limitations sitting on chair       Lumbar Exercises: Aerobic   Nustep L4 x 6 min (UE/LE)      Lumbar Exercises: Supine   Bridge 15 reps;3 seconds      Knee/Hip Exercises: Standing   Knee Flexion Right;10 reps    Knee Flexion Limitations holding counter     Functional Squat 10 reps;3 seconds    Functional Squat Limitations counter  Knee/Hip Exercises: Seated   Clamshell with TheraBand Green   x 10 reps      Moist Heat Therapy   Number Minutes Moist Heat 15 Minutes    Moist Heat Location Lumbar Spine;Hip   R buttocks      Electrical Stimulation   Electrical Stimulation Location R glutes    Electrical Stimulation Action IFC    Electrical Stimulation Parameters 80-150Hz , intensity to pt. tolerance, 15'    Electrical Stimulation Goals Pain;Tone                    PT Short Term Goals - 03/26/20 0856      PT SHORT TERM GOAL #4   Title Pt will notice centralization of pain in Rt LE with exercises and ADLs    Status Achieved   03/26/20            PT Long Term Goals - 04/02/20 1728      PT LONG TERM GOAL #1   Title independent with advanced  HEP    Status On-going    Target Date 05/05/20      PT LONG TERM GOAL #5   Title Pt will be able to walk without a limp most of the time, pain in back, hip < 3/10    Status On-going    Target Date 05/05/20      PT  LONG TERM GOAL #6   Title Pt will be able to increase Rt LE strength to 4/5 for greater stability with gait, standing    Status On-going    Target Date 05/05/20      PT LONG TERM GOAL #7   Title Pt will be able to stand and walk for 20 min with no more than min pain in Rt back, hip.    Status On-going    Target Date 05/05/20                 Plan - 04/27/20 0818    Clinical Impression Statement Pt. reporting she feels she will be ready to transition to home program after next two visits.  Reviewed comprehensive HEP to check for understanding and need for update.  Pt. requesting new red looped TB for HEP thus issued this.  reviewed latest HEP update for LE strengthening as pt. has not yet attempted this over the holiday weekend.  Pt. coming into session complaining of increased R lateral/anterior hip pain after prolonged.  Pt. noting pain relief after use of requested E-stim/moist heat to R hip/glutes.  Will consider further HEP update in coming session per pt. compliance with latest HEP update.    Comorbidities Diabetes, pelvic , OA and ankle fracture    Rehab Potential Good    PT Frequency 2x / week    PT Duration 8 weeks    PT Treatment/Interventions ADLs/Self Care Home Management;Biofeedback;Cryotherapy;Electrical Stimulation;Iontophoresis 4mg /ml Dexamethasone;Moist Heat;Ultrasound;Gait training;Therapeutic activities;Therapeutic exercise;Manual techniques;Patient/family education;Neuromuscular re-education;Passive range of motion;Dry needling;Spinal Manipulations;Joint Manipulations    PT Next Visit Plan Continue with HEP update in preparation for end of POC; STM/DTM to lumbar spine & R anterior/lateral hip; modalities as indicated; kinesiotaping as continued benefit noted    PT Home Exercise Plan PELVIC : [id number].Marland Kitchen BACK: Prone prop, press up and Tr A, sidelying Rt trunk; 10/11 (BACK) - supine KTOS, seated hip flexor stretch; 10/25 - modified piri stretch, bridge, ball massage to  glutes on wall, seated march; 10/28 - McKenzie extension progression; rolling pin self-STM; 04/09/20 - forward step up, calf stretch; 11/18 -  counter squat, seated TB clam shell (pt. already using green), standing HS curl    Consulted and Agree with Plan of Care Patient           Patient will benefit from skilled therapeutic intervention in order to improve the following deficits and impairments:  Decreased activity tolerance, Decreased strength, Increased fascial restricitons, Pain, Decreased mobility, Increased muscle spasms, Decreased range of motion, Decreased coordination, Obesity, Postural dysfunction, Impaired flexibility, Impaired sensation, Difficulty walking  Visit Diagnosis: Muscle weakness (generalized)  Pain in right hip  Stiffness of right hip, not elsewhere classified  Cramp and spasm     Problem List Patient Active Problem List   Diagnosis Date Noted  . Vasomotor symptoms due to menopause 04/24/2020  . Atrophic vaginitis 04/24/2020  . Impingement syndrome of left shoulder 01/23/2018    Bess Harvest, PTA 04/27/20 12:11 PM   [facility] [address]  [place], [place], [address] Phone: [phone]   Fax:  [phone]  Name: Jaylon Grode MRN: [id number] Date of Birth: 29-Apr-1966

## 2020-04-29 NOTE — Telephone Encounter (Signed)
Disability form were received.

## 2020-04-30 ENCOUNTER — Ambulatory Visit: Payer: BLUE CROSS/BLUE SHIELD | Attending: Podiatry

## 2020-04-30 ENCOUNTER — Other Ambulatory Visit: Payer: Self-pay

## 2020-04-30 DIAGNOSIS — M25651 Stiffness of right hip, not elsewhere classified: Secondary | ICD-10-CM | POA: Diagnosis present

## 2020-04-30 DIAGNOSIS — M6281 Muscle weakness (generalized): Secondary | ICD-10-CM | POA: Insufficient documentation

## 2020-04-30 DIAGNOSIS — R252 Cramp and spasm: Secondary | ICD-10-CM | POA: Diagnosis present

## 2020-04-30 DIAGNOSIS — M25551 Pain in right hip: Secondary | ICD-10-CM | POA: Insufficient documentation

## 2020-04-30 NOTE — Therapy (Signed)
[facility] [address]  [place], [place], [address] Phone: [phone]   Fax:  [phone]  Physical Therapy Treatment  Patient Details  Name: Ann Foster MRN: [id number] Date of Birth: 27-Oct-1965 Referring Provider (PT): Alda Berthold, DO & Trula Slade, DPM (Dr. Posey Pronto - LBP/radiculopathy; Dr. Jacqualyn Posey - foot pain)   Encounter Date: 04/30/2020   PT End of Session - 04/30/20 0845    Visit Number 29    Number of Visits 30    Date for PT Re-Evaluation 05/05/20    Authorization Type BCBS    Authorization - Visit Number 29    Authorization - Number of Visits 30    PT Start Time 0840    PT Stop Time 0925    PT Time Calculation (min) 45 min    Activity Tolerance Patient limited by pain;Patient tolerated treatment well    Behavior During Therapy [facility] for tasks assessed/performed           Past Medical History:  Diagnosis Date  . Ankle fracture 2016   Right  . Aortic atherosclerosis (HCC)    trace calcific atherosclerosis aortic arch per ct neck done 12-22-17  . Bronchitis   . Diabetes mellitus without complication ([place])   . Hypertension   . OA (osteoarthritis) of shoulder    left shoulder, both knees arthritis    Past Surgical History:  Procedure Laterality Date  . NO PAST SURGERIES      There were no vitals filed for this visit.   Subjective Assessment - 04/30/20 0847    Subjective Pt. noting increased pain yesterday without known trigger.    Pertinent History h/o R ankle fracture, plantar fasciitis?, pelvic pain , diabetes, HTN    Diagnostic tests MRI 9/26    Patient Stated Goals reduce pain    Currently in Pain? Yes    Pain Score 7     Pain Location Hip    Pain Orientation Right;Lateral    Pain Descriptors / Indicators Burning;Aching    Pain Type Chronic pain    Pain Onset More than a month ago    Pain Frequency Constant    Multiple Pain Sites No                              [facility] Adult PT Treatment/Exercise - 04/30/20 0001      Self-Care   Self-Care Other Self-Care Comments    Posture reviewed full posture and body mechanics handout with pt. as she noted she lost her old copy - re-issued handout     Other Self-Care Comments  reviewed comprehensive HEP;  instructed pt. to continue with all HEP activities and updated HEP with bridge/abd band       Lumbar Exercises: Stretches   Hip Flexor Stretch Right;30 seconds;3 reps    Hip Flexor Stretch Limitations sitting on chair       Lumbar Exercises: Seated   Other Seated Lumbar Exercises Alternating march x 10      Knee/Hip Exercises: Supine   Bridges with Clamshell Both;10 reps;Strengthening   B hip abd/ER into red TB      Moist Heat Therapy   Number Minutes Moist Heat 15 Minutes    Moist Heat Location Lumbar Spine;Hip   lumbar spine and R anterior hip seated in chair  PT Education - 04/30/20 1212    Education Details HEP update    Person(s) Educated Patient    Methods Explanation;Demonstration;Handout;Verbal cues    Comprehension Verbalized understanding;Returned demonstration;Verbal cues required            PT Short Term Goals - 03/26/20 0856      PT SHORT TERM GOAL #4   Title Pt will notice centralization of pain in Rt LE with exercises and ADLs    Status Achieved   03/26/20            PT Long Term Goals - 04/02/20 1728      PT LONG TERM GOAL #1   Title independent with advanced  HEP    Status On-going    Target Date 05/05/20      PT LONG TERM GOAL #5   Title Pt will be able to walk without a limp most of the time, pain in back, hip < 3/10    Status On-going    Target Date 05/05/20      PT LONG TERM GOAL #6   Title Pt will be able to increase Rt LE strength to 4/5 for greater stability with gait, standing    Status On-going    Target Date 05/05/20      PT LONG TERM GOAL #7   Title Pt will be able to stand and walk  for 20 min with no more than min pain in Rt back, hip.    Status On-going    Target Date 05/05/20                 Plan - 04/30/20 0850    Clinical Impression Statement Ann Foster reports R-sided hip pain today.  Does feel that she would like to transition to the home program after next session. Focused session on LE stretching to address R hip along with review and updated of HEP to prepare for probable transition to home program at next session.  Pt. tolerated bridge/abduction with red TB well with improved tolerance for increased ROM thus updated HEP accordingly.  Will plan for final goal testing and transition to home program at upcoming visit.    Comorbidities Diabetes, pelvic , OA and ankle fracture    Rehab Potential Good    PT Frequency 2x / week    PT Duration 8 weeks    PT Treatment/Interventions ADLs/Self Care Home Management;Biofeedback;Cryotherapy;Electrical Stimulation;Iontophoresis 4mg /ml Dexamethasone;Moist Heat;Ultrasound;Gait training;Therapeutic activities;Therapeutic exercise;Manual techniques;Patient/family education;Neuromuscular re-education;Passive range of motion;Dry needling;Spinal Manipulations;Joint Manipulations    PT Next Visit Plan anticipated 30-day hold    PT Home Exercise Plan PELVIC : [id number].Marland Kitchen BACK: Prone prop, press up and Tr A, sidelying Rt trunk; 10/11 (BACK) - supine KTOS, seated hip flexor stretch; 10/25 - modified piri stretch, bridge, ball massage to glutes on wall, seated march; 10/28 - McKenzie extension progression; rolling pin self-STM; 04/09/20 - forward step up, calf stretch; 11/18 - counter squat, seated TB clam shell (pt. already using green), standing HS curl    Consulted and Agree with Plan of Care Patient           Patient will benefit from skilled therapeutic intervention in order to improve the following deficits and impairments:  Decreased activity tolerance, Decreased strength, Increased fascial restricitons, Pain, Decreased mobility,  Increased muscle spasms, Decreased range of motion, Decreased coordination, Obesity, Postural dysfunction, Impaired flexibility, Impaired sensation, Difficulty walking  Visit Diagnosis: Muscle weakness (generalized)  Pain in right hip  Stiffness of right hip, not elsewhere classified  Cramp  and spasm     Problem List Patient Active Problem List   Diagnosis Date Noted  . Vasomotor symptoms due to menopause 04/24/2020  . Atrophic vaginitis 04/24/2020  . Impingement syndrome of left shoulder 01/23/2018   Bess Harvest, PTA 04/30/20 2:36 PM   [facility] [address]  [place], [place], [address] Phone: [phone]   Fax:  [phone]  Name: Ann Foster MRN: [id number] Date of Birth: 03-Dec-1965

## 2020-05-04 ENCOUNTER — Encounter: Payer: Self-pay | Admitting: Physical Therapy

## 2020-05-04 ENCOUNTER — Other Ambulatory Visit: Payer: Self-pay

## 2020-05-04 ENCOUNTER — Ambulatory Visit: Payer: BLUE CROSS/BLUE SHIELD | Admitting: Physical Therapy

## 2020-05-04 DIAGNOSIS — M25651 Stiffness of right hip, not elsewhere classified: Secondary | ICD-10-CM

## 2020-05-04 DIAGNOSIS — R252 Cramp and spasm: Secondary | ICD-10-CM

## 2020-05-04 DIAGNOSIS — M25551 Pain in right hip: Secondary | ICD-10-CM

## 2020-05-04 DIAGNOSIS — M6281 Muscle weakness (generalized): Secondary | ICD-10-CM

## 2020-05-04 NOTE — Therapy (Addendum)
[facility] [address]  [place], [place], [address] Phone: [phone]   Fax:  [phone]  Physical Therapy Treatment / Progress Note / Discharge Summary  Patient Details  Name: Ann Foster MRN: [id number] Date of Birth: 1966/05/10 Referring Provider (PT): Alda Berthold, DO & Trula Slade, DPM (Dr. Posey Pronto - LBP/radiculopathy; Dr. Jacqualyn Posey - foot pain)   Encounter Date: 05/04/2020   PT End of Session - 05/04/20 0845    Visit Number 30   20th visit for LBP   Number of Visits 30    Date for PT Re-Evaluation 05/05/20    Authorization Type BCBS    Authorization - Visit Number 30    Authorization - Number of Visits 30    PT Start Time 0845    PT Stop Time 0929    PT Time Calculation (min) 44 min    Activity Tolerance Patient limited by pain;Patient tolerated treatment well    Behavior During Therapy [facility] for tasks assessed/performed           Past Medical History:  Diagnosis Date  . Ankle fracture 2016   Right  . Aortic atherosclerosis (HCC)    trace calcific atherosclerosis aortic arch per ct neck done 12-22-17  . Bronchitis   . Diabetes mellitus without complication ([place])   . Hypertension   . OA (osteoarthritis) of shoulder    left shoulder, both knees arthritis    Past Surgical History:  Procedure Laterality Date  . NO PAST SURGERIES      There were no vitals filed for this visit.   Subjective Assessment - 05/04/20 0850    Subjective Pt expressing continued interest in attempting transition to HEP but would like to remain on 30-day hold. Pt states "the rolling pin has become my best friend".    Pertinent History h/o R ankle fracture, plantar fasciitis?, pelvic pain , diabetes, HTN    How long can you sit comfortably? 25-30 minutes    How long can you walk comfortably? 20-30 walking in a store using shopping cart    Diagnostic tests MRI 9/26    Patient Stated Goals reduce pain    Currently  in Pain? Yes    Pain Score 7     Pain Location Buttocks    Pain Orientation Right;Posterior;Lateral    Pain Descriptors / Indicators --   "hurting", "tense"   Pain Type Chronic pain    Pain Frequency Constant    Pain Score 6    Pain Location Leg    Pain Orientation Right;Lateral    Pain Descriptors / Indicators Other (Comment)   "tense"   Pain Type Chronic pain    Pain Frequency Constant              [facility] PT Assessment - 05/04/20 0845      Assessment   Medical Diagnosis Lumbar spondylolysis & stenosis with radiculopathy; plantar fasciitis & chronic foot pain    Referring Provider (PT) Alda Berthold, DO & Trula Slade, DPM   Dr. Posey Pronto - LBP/radiculopathy; Dr. Jacqualyn Posey - foot pain   Next MD Visit 06/01/20 - Dr. Posey Pronto      Strength   Right Hip Flexion 4-/5    Right Hip Extension 4-/5    Right Hip External Rotation  4/5    Right Hip Internal Rotation 4/5    Right Hip ABduction 3+/5    Right Hip ADduction 4-/5    Left Hip Flexion  5/5    Left Hip Extension 4+/5    Left Hip External Rotation 4/5    Left Hip Internal Rotation 4+/5    Left Hip ABduction 4/5    Left Hip ADduction 4+/5    Right Knee Flexion 4-/5    Right Knee Extension 4/5    Left Knee Flexion 4+/5    Left Knee Extension 4+/5    Right Ankle Dorsiflexion 4-/5    Left Ankle Dorsiflexion 5/5                         [facility] Adult PT Treatment/Exercise - 05/04/20 0845      Self-Care   Other Self-Care Comments  Provided instruction in kinesiotape application instruction for continued use with HEP.      Exercises   Exercises Lumbar      Lumbar Exercises: Aerobic   Recumbent Bike L2 x 6 min      Kinesiotix   Inhibit Muscle  B lumbar paraspinals 30-50% from distal sacrum to lower thoracic + 50% perpendicular strip at level of SIJ                  PT Education - 05/04/20 0920    Education Details Kinesiotape application instruction    Person(s) Educated Patient    Methods  Explanation;Demonstration    Comprehension Verbalized understanding            PT Short Term Goals - 03/26/20 0856      PT SHORT TERM GOAL #4   Title Pt will notice centralization of pain in Rt LE with exercises and ADLs    Status Achieved   03/26/20            PT Long Term Goals - 05/04/20 0856      PT LONG TERM GOAL #1   Title independent with advanced  HEP    Status Achieved   05/04/20     PT LONG TERM GOAL #5   Title Pt will be able to walk without a limp most of the time, pain in back, hip < 3/10    Status Not Met      PT LONG TERM GOAL #6   Title Pt will be able to increase Rt LE strength to 4/5 for greater stability with gait, standing    Status Partially Met      PT LONG TERM GOAL #7   Title Pt will be able to stand and walk for 20 min with no more than min pain in Rt back, hip.    Status Partially Met   05/04/20 - able to walk 20-30 minutes in a store using the shopping cart but pain can sometimes get up to a 9/10                Plan - 05/04/20 0929    Clinical Impression Statement Yena reports 40-50% improvement in pain and positional/mobility tolerance but still experiences pain at ~7/10 in low back, L buttock and L LE. She has noted some centralization of her pain throughout the course of PT but not full resolution of her radicular pain. She notes improvement in positional tolerance for sitting as well as walking but still has intermittent limp on R due to pain. Her B LE strength has improved but continued weakness noted R vs L. LTGs only partially met with pain level goal not met. Rosaleigh expressing desire to try continuing on her own with her HEP for now but  would like to remain on hold for 30-days in the event that she feel the need to return to PT or MD indicated further PT as of 06/01/20 follow-up appointment.    Comorbidities Diabetes, pelvic , OA and ankle fracture    Rehab Potential Good    PT Treatment/Interventions ADLs/Self Care Home  Management;Biofeedback;Cryotherapy;Electrical Stimulation;Iontophoresis 4mg /ml Dexamethasone;Moist Heat;Ultrasound;Gait training;Therapeutic activities;Therapeutic exercise;Manual techniques;Patient/family education;Neuromuscular re-education;Passive range of motion;Dry needling;Spinal Manipulations;Joint Manipulations    PT Next Visit Plan 30-day hold    PT Home Exercise Plan PELVIC : [id number].Marland Kitchen BACK: Prone prop, press up and Tr A, sidelying Rt trunk; 10/11 (BACK) - supine KTOS, seated hip flexor stretch; 10/25 - modified piri stretch, bridge, ball massage to glutes on wall, seated march; 10/28 - McKenzie extension progression; rolling pin self-STM; 04/09/20 - forward step up, calf stretch; 11/18 - counter squat, seated TB clam shell (pt. already using green), standing HS curl    Consulted and Agree with Plan of Care Patient           Patient will benefit from skilled therapeutic intervention in order to improve the following deficits and impairments:  Decreased activity tolerance, Decreased strength, Increased fascial restricitons, Pain, Decreased mobility, Increased muscle spasms, Decreased range of motion, Decreased coordination, Obesity, Postural dysfunction, Impaired flexibility, Impaired sensation, Difficulty walking  Visit Diagnosis: Muscle weakness (generalized)  Pain in right hip  Stiffness of right hip, not elsewhere classified  Cramp and spasm     Problem List Patient Active Problem List   Diagnosis Date Noted  . Vasomotor symptoms due to menopause 04/24/2020  . Atrophic vaginitis 04/24/2020  . Impingement syndrome of left shoulder 01/23/2018    Percival Spanish, PT, MPT 05/04/2020, 12:38 PM  [facility] [address]  [place], [place], [address] Phone: [phone]   Fax:  [phone]  Name: Gwendolen Hewlett MRN: [id number] Date of Birth: 06-Jul-1965   PHYSICAL THERAPY DISCHARGE SUMMARY  Visits from  Start of Care: 30 (1st 10 visits for pelvic floor, 20 visits for LBP/radiculopathy)  Current functional level related to goals / functional outcomes:   Refer to above clinical impression for status as of last visit on 05/04/2020. Patient was placed on hold for 30 days and has not needed to return to PT, therefore will proceed with discharge from PT for this episode.   Remaining deficits:   As above.    Education / Equipment:   HEP; Neurosurgeon.  Plan: Patient agrees to discharge.  Patient goals were partially met. Patient is being discharged due to being pleased with the current functional level.  ?????     Percival Spanish, PT, MPT 06/04/20, 3:35 PM  [facility] [address]  [place], [place], [address] Phone: [phone]   Fax:  [phone]

## 2020-06-01 ENCOUNTER — Encounter: Payer: Self-pay | Admitting: Neurology

## 2020-06-01 ENCOUNTER — Other Ambulatory Visit: Payer: Self-pay

## 2020-06-01 ENCOUNTER — Other Ambulatory Visit: Payer: Self-pay | Admitting: Neurology

## 2020-06-01 ENCOUNTER — Ambulatory Visit (INDEPENDENT_AMBULATORY_CARE_PROVIDER_SITE_OTHER): Payer: Self-pay | Admitting: Neurology

## 2020-06-01 VITALS — BP 126/89 | HR 80 | Ht 62.0 in | Wt 194.0 lb

## 2020-06-01 DIAGNOSIS — M5416 Radiculopathy, lumbar region: Secondary | ICD-10-CM

## 2020-06-01 MED ORDER — NORTRIPTYLINE HCL 10 MG PO CAPS: ORAL_CAPSULE | ORAL | 5 refills | Status: DC

## 2020-06-01 MED FILL — NORTRIPTYLINE HCL 10 MG CAP: 10 | 37 days supply | Qty: 60 | Fill #0

## 2020-06-01 NOTE — Patient Instructions (Signed)
Reduce Lyrica to 150mg  daily  Start nortriptyline 10mg  at bedtime for 2 week, then increase to 2 tablet at bedtime  Return to clinic in 6 months

## 2020-06-01 NOTE — Progress Notes (Signed)
Follow-up Visit   Date: 06/01/20   Ann Foster MRN: [id number] DOB: 06-15-1965   Interim History: Ann Foster is a 55 y.o. right-handed female with diabetes mellitus, tobacco use, hypertension, and hyperlipidemia  returning to the clinic for follow-up of lumbosacral radiculopathy.  The patient was accompanied to the clinic by self.  She has been compliant with going to PT which helps her pain when she is doing her exercises, but it quickly returns.  She had some benefit with Lyrica 139m BID however reduced it to 1547mdaily due to GI side effects.  Pain is no worse than before and continues to involve her right leg.  No new weakness, numbness, or tingling.    Medications:  Current Outpatient Medications on File Prior to Visit  Medication Sig Dispense Refill  . Blood Glucose Monitoring Suppl (CONTOUR BLOOD GLUCOSE SYSTEM) w/Device KIT E11.65 Use as instructed. Check blood glucose level by fingerstick twice per day. 1 kit 0  . cyclobenzaprine (FLEXERIL) 5 MG tablet Take 1 tablet (5 mg total) by mouth at bedtime as needed for muscle spasms. 30 tablet 1  . diazepam (VALIUM) 5 MG tablet Take 30-min prior to MRI.  Do not drive. 2 tablet 0  . estradiol (VIVELLE-DOT) 0.0375 MG/24HR Place 1 patch onto the skin 2 (two) times a week. 8 patch 12  . glucose blood (CONTOUR NEXT TEST) test strip Use as instructed. Check blood glucose level by fingerstick twice per day.  E11.65 100 each 12  . ibuprofen (ADVIL,MOTRIN) 200 MG tablet Take 400-600 mg by mouth every 6 (six) hours as needed for headache (pain).    . Prasterone (INTRAROSA) 6.5 MG INST Insert one capsule vaginally 28 each 3  . pregabalin (LYRICA) 150 MG capsule Take 1 capsule (150 mg total) by mouth 2 (two) times daily. 60 capsule 2  . progesterone (PROMETRIUM) 200 MG capsule Take 1 capsule (200 mg total) by mouth daily. 30 capsule 3  . TRUEplus Lancets 28G MISC 1 each by Does not apply route 2 (two) times daily. E11.65 200  each 6  . atorvastatin (LIPITOR) 20 MG tablet Take 1 tablet (20 mg total) by mouth daily. 90 tablet 0  . metFORMIN (GLUCOPHAGE) 500 MG tablet Take 2 tablets (1,000 mg total) by mouth 2 (two) times daily with a meal. 360 tablet 0  . ramipril (ALTACE) 2.5 MG capsule Take 1 capsule (2.5 mg total) by mouth daily. 90 capsule 0  . [DISCONTINUED] lisinopril (ZESTRIL) 2.5 MG tablet Take 1 tablet (2.5 mg total) by mouth 2 (two) times daily. 60 tablet 1   No current facility-administered medications on file prior to visit.    Allergies: No Known Allergies  Vital Signs:  BP 126/89   Pulse 80   Ht _0  (1.575 m)   Wt 194 lb (88 kg)   SpO2 99%   BMI 35.48 kg/m    Neurological Exam: MENTAL STATUS including orientation to time, place, person, recent and remote memory, attention span and concentration, language, and fund of knowledge is normal.  Speech is not dysarthric.  CRANIAL NERVES: Normal conjugate, extra-ocular eye movements in all directions of gaze.  No ptosis   MOTOR:  Motor strength is 5/5 in all extremities.  No atrophy, fasciculations or abnormal movements.  No pronator drift.  Tone is normal.    MSRs:  Reflexes are 2+/4 throughout.  SENSORY:  Intact to vibration throughout .  COORDINATION/GAIT:    Gait is antalgic, unassisted, stable.  Data: MRI  lumbar spine wo contrast 02/24/2020: 1. Small right foraminal disc protrusion at L3-4, closely approximating and potentially irritating the exiting right L3 nerve root. 2. Small biforaminal disc protrusions at L4-5, potentially affecting either of the exiting L4 nerve roots. 3. Left extraforaminal disc protrusion at L5-S1, contacting the exiting left L5 nerve root. 4. Moderate bilateral facet hypertrophy at L3-4 through L5-S1. 5. Transitional lumbosacral anatomy. 6. Right kidney positioned within the mid pelvis. 7. Cholelithiasis.   IMPRESSION/PLAN: Lumbosacral radiculopathy, stable. MRI lumbar spine was personally viewed and  shows multilevel biforaminal disc protrusion which could potentially impinge the right L4, bilateral L4, and left L5 nerve roots.   She reports having some improvement with PT, but experiencing side effects to Lyrica.  Alternative management options discussed as noted below  - Continue home exercises  - Start nortriptyline 59m at bedtime for 2 week, then increase to 2 tablet at bedtime  - Reduce Lyrica to 1528mdaily due to side effects  - Continue flexeril 33m71mt bedtime  - Consider ESI going forward  Return to clinic in 6 months.    Thank you for allowing me to participate in patient's care.  If I can answer any additional questions, I would be pleased to do so.    Sincerely,    Pattye Meda K. PatPosey ProntoO

## 2020-06-05 MED FILL — METFORMIN HCL 500 MG TABS: 500 | 30 days supply | Qty: 120 | Fill #0

## 2020-06-11 MED FILL — NORTRIPTYLINE HCL 10 MG CAP: 10 | 37 days supply | Qty: 60 | Fill #0

## 2020-06-11 MED FILL — METFORMIN HCL 500 MG TABS: 500 | 30 days supply | Qty: 120 | Fill #0

## 2020-07-16 ENCOUNTER — Telehealth: Payer: Self-pay | Admitting: Nurse Practitioner

## 2020-07-16 NOTE — Telephone Encounter (Signed)
Copied from [place] [id number]. Topic: General - Other >> Jul 16, 2020  9:05 AM Leward Quan A wrote: Reason for CRM: Patient called in to request an Rx from [facility] for a UTI say that she have pain during urination Ph# [phone]

## 2020-07-17 NOTE — Telephone Encounter (Signed)
Schedule a urine drop off lab appt. Patient is aware of date and time.

## 2020-07-20 ENCOUNTER — Ambulatory Visit: Payer: BLUE CROSS/BLUE SHIELD | Attending: Nurse Practitioner

## 2020-07-20 ENCOUNTER — Other Ambulatory Visit: Payer: Self-pay | Admitting: Nurse Practitioner

## 2020-07-20 ENCOUNTER — Other Ambulatory Visit: Payer: Self-pay

## 2020-07-20 DIAGNOSIS — E785 Hyperlipidemia, unspecified: Secondary | ICD-10-CM

## 2020-07-20 DIAGNOSIS — I10 Essential (primary) hypertension: Secondary | ICD-10-CM

## 2020-07-20 DIAGNOSIS — E1165 Type 2 diabetes mellitus with hyperglycemia: Secondary | ICD-10-CM

## 2020-07-20 DIAGNOSIS — R7989 Other specified abnormal findings of blood chemistry: Secondary | ICD-10-CM

## 2020-07-21 ENCOUNTER — Other Ambulatory Visit: Payer: Self-pay | Admitting: Nurse Practitioner

## 2020-07-21 DIAGNOSIS — E785 Hyperlipidemia, unspecified: Secondary | ICD-10-CM

## 2020-07-21 DIAGNOSIS — E1165 Type 2 diabetes mellitus with hyperglycemia: Secondary | ICD-10-CM

## 2020-07-21 LAB — CBC
Hematocrit: 40.2 % (ref 34.0–46.6)
Hemoglobin: 13.1 g/dL (ref 11.1–15.9)
MCH: 31.1 pg (ref 26.6–33.0)
MCHC: 32.6 g/dL (ref 31.5–35.7)
MCV: 96 fL (ref 79–97)
Platelets: 333 10*3/uL (ref 150–450)
RBC: 4.21 x10E6/uL (ref 3.77–5.28)
RDW: 11.7 % (ref 11.7–15.4)
WBC: 8.9 10*3/uL (ref 3.4–10.8)

## 2020-07-21 LAB — HEMOGLOBIN A1C
Est. average glucose Bld gHb Est-mCnc: 266 mg/dL
Hgb A1c MFr Bld: 10.9 % — ABNORMAL HIGH (ref 4.8–5.6)

## 2020-07-21 LAB — CMP14+EGFR
ALT: 13 IU/L (ref 0–32)
AST: 10 IU/L (ref 0–40)
Albumin/Globulin Ratio: 1.3 (ref 1.2–2.2)
Albumin: 4.3 g/dL (ref 3.8–4.9)
Alkaline Phosphatase: 81 IU/L (ref 44–121)
BUN/Creatinine Ratio: 13 (ref 9–23)
BUN: 11 mg/dL (ref 6–24)
Bilirubin Total: 0.5 mg/dL (ref 0.0–1.2)
CO2: 23 mmol/L (ref 20–29)
Calcium: 10 mg/dL (ref 8.7–10.2)
Chloride: 97 mmol/L (ref 96–106)
Creatinine, Ser: 0.83 mg/dL (ref 0.57–1.00)
GFR calc Af Amer: 91 mL/min/{1.73_m2} (ref >59)
GFR calc non Af Amer: 79 mL/min/{1.73_m2} (ref >59)
Globulin, Total: 3.2 g/dL (ref 1.5–4.5)
Glucose: 319 mg/dL — ABNORMAL HIGH (ref 65–99)
Potassium: 4.6 mmol/L (ref 3.5–5.2)
Sodium: 136 mmol/L (ref 134–144)
Total Protein: 7.5 g/dL (ref 6.0–8.5)

## 2020-07-21 LAB — LIPID PANEL
Chol/HDL Ratio: 7.3 ratio — ABNORMAL HIGH (ref 0.0–4.4)
Cholesterol, Total: 290 mg/dL — ABNORMAL HIGH (ref 100–199)
HDL: 40 mg/dL (ref >39)
LDL Chol Calc (NIH): 204 mg/dL — ABNORMAL HIGH (ref 0–99)
Triglycerides: 233 mg/dL — ABNORMAL HIGH (ref 0–149)
VLDL Cholesterol Cal: 46 mg/dL — ABNORMAL HIGH (ref 5–40)

## 2020-07-21 MED ORDER — TRUE METRIX BLOOD GLUCOSE TEST VI STRP: ORAL_STRIP | 12 refills | Status: DC

## 2020-07-21 MED ORDER — RAMIPRIL 2.5 MG PO CAPS: 2.5000 mg | ORAL_CAPSULE | Freq: Every day | ORAL | 0 refills | Status: DC

## 2020-07-21 MED ORDER — TRUEPLUS LANCETS 28G MISC
6 refills | Status: DC
Start: 2020-07-21 — End: 2020-07-29

## 2020-07-21 MED ORDER — METFORMIN HCL 500 MG PO TABS: 1000.0000 mg | ORAL_TABLET | Freq: Two times a day (BID) | ORAL | 0 refills | Status: DC

## 2020-07-21 MED ORDER — ATORVASTATIN CALCIUM 40 MG PO TABS: 40.0000 mg | ORAL_TABLET | Freq: Every day | ORAL | 1 refills | Status: DC

## 2020-07-21 MED ORDER — EMPAGLIFLOZIN 25 MG PO TABS: 25.0000 mg | ORAL_TABLET | Freq: Every day | ORAL | 1 refills | Status: DC

## 2020-07-22 ENCOUNTER — Telehealth: Payer: Self-pay | Admitting: Nurse Practitioner

## 2020-07-22 ENCOUNTER — Other Ambulatory Visit: Payer: Self-pay | Admitting: Nurse Practitioner

## 2020-07-22 DIAGNOSIS — E785 Hyperlipidemia, unspecified: Secondary | ICD-10-CM

## 2020-07-22 MED ORDER — ATORVASTATIN CALCIUM 40 MG PO TABS: 40.0000 mg | ORAL_TABLET | Freq: Every day | ORAL | 1 refills | Status: DC

## 2020-07-22 MED FILL — ATORVASTATIN CALCIUM 40 MG: 40 | 90 days supply | Qty: 90 | Fill #0

## 2020-07-22 MED FILL — METFORMIN HCL 500 MG TABS: 500 | 90 days supply | Qty: 360 | Fill #0

## 2020-07-22 MED FILL — RAMIPRIL 2.5 MG CAPSULE: 2.5 | 90 days supply | Qty: 90 | Fill #0

## 2020-07-22 NOTE — Telephone Encounter (Signed)
Pt called in for assistance pt says that her Rx for atorvastatin (LIPITOR) 40 MG tablet is to expensive. Pt says with discount it cost 300.00.    Pharmacy: [facility] [address]  [place], [place] [address]  Phone:  [phone] Fax:  [phone]    [phone]

## 2020-07-22 NOTE — Telephone Encounter (Signed)
She can [facility] it up from the [facility]. Is there any reason she is using the OP pharmacy with no insurance???

## 2020-07-24 MED ORDER — ATORVASTATIN CALCIUM 40 MG PO TABS: 40.0000 mg | ORAL_TABLET | Freq: Every day | ORAL | 1 refills | Status: DC

## 2020-07-24 NOTE — Telephone Encounter (Signed)
CMA will resend her cholesterol RX to [place] [address].

## 2020-07-29 ENCOUNTER — Other Ambulatory Visit: Payer: Self-pay

## 2020-07-29 ENCOUNTER — Encounter: Payer: Self-pay | Admitting: Nurse Practitioner

## 2020-07-29 ENCOUNTER — Ambulatory Visit: Payer: BLUE CROSS/BLUE SHIELD | Attending: Nurse Practitioner | Admitting: Nurse Practitioner

## 2020-07-29 ENCOUNTER — Other Ambulatory Visit: Payer: Self-pay | Admitting: Nurse Practitioner

## 2020-07-29 VITALS — BP 128/87 | HR 84 | Resp 16 | Ht 64.5 in | Wt 197.4 lb

## 2020-07-29 DIAGNOSIS — E1165 Type 2 diabetes mellitus with hyperglycemia: Secondary | ICD-10-CM

## 2020-07-29 DIAGNOSIS — Z1231 Encounter for screening mammogram for malignant neoplasm of breast: Secondary | ICD-10-CM

## 2020-07-29 DIAGNOSIS — Z0000 Encounter for general adult medical examination without abnormal findings: Secondary | ICD-10-CM | POA: Diagnosis not present

## 2020-07-29 DIAGNOSIS — Z2821 Immunization not carried out because of patient refusal: Secondary | ICD-10-CM | POA: Diagnosis not present

## 2020-07-29 LAB — GLUCOSE, POCT (MANUAL RESULT ENTRY): POC Glucose: 209 mg/dl — AB (ref 70–99)

## 2020-07-29 MED ORDER — GLIMEPIRIDE 4 MG PO TABS: 8.0000 mg | ORAL_TABLET | Freq: Every day | ORAL | 0 refills | Status: DC

## 2020-07-29 MED ORDER — ACCU-CHEK GUIDE ME W/DEVICE KIT: 1.0000 | PACK | Freq: Once | 0 refills | Status: AC

## 2020-07-29 MED ORDER — GLIMEPIRIDE 4 MG PO TABS: 4.0000 mg | ORAL_TABLET | Freq: Every day | ORAL | 1 refills | Status: DC

## 2020-07-29 MED ORDER — ACCU-CHEK GUIDE VI STRP: ORAL_STRIP | 12 refills | Status: DC

## 2020-07-29 MED ORDER — ACCU-CHEK SOFTCLIX LANCETS MISC
3 refills | Status: DC
Start: 2020-07-29 — End: 2020-07-29

## 2020-07-29 MED ORDER — ACCU-CHEK AVIVA PLUS W/DEVICE KIT: PACK | 0 refills | Status: DC

## 2020-07-29 MED FILL — GLIMEPIRIDE 4 MG TABLET: 4 | 90 days supply | Qty: 90 | Fill #0

## 2020-07-29 NOTE — Progress Notes (Signed)
Assessment & Plan:  Ann Foster was seen today for annual exam and diabetes.  Diagnoses and all orders for this visit:  Type 2 diabetes mellitus with hyperglycemia, unspecified whether long term insulin use (HCC) -     POCT glucose (manual entry) -     Microalbumin / creatinine urine ratio Continue blood sugar control as discussed in office today, low carbohydrate diet, and regular physical exercise as tolerated, 150 minutes per week (30 min each day, 5 days per week, or 50 min 3 days per week). Keep blood sugar logs with fasting goal of 90-130 mg/dl, post prandial (after you eat) less than 180.  For Hypoglycemia: BS <60 and Hyperglycemia BS >400; contact the clinic ASAP. Annual eye exams and foot exams are recommended.   Influenza vaccine refused  COVID-19 vaccine dose declined  Breast cancer screening by mammogram -     MM 3D SCREEN BREAST BILATERAL; Future    Patient has been counseled on age-appropriate routine health concerns for screening and prevention. These are reviewed and up-to-date. Referrals have been placed accordingly. Immunizations are up-to-date or declined.    Subjective:   Chief Complaint  Patient presents with  . Annual Exam  . Diabetes   HPI Ann Foster 55 y.o. female presents to office today for annual physical. Patient has been counseled on age-appropriate routine health concerns for screening and prevention. These are reviewed and up-to-date. Referrals have been placed accordingly. Immunizations are up-to-date or declined.     DM2 She stopped taking her metformin 1000 mg BID. States she felt fine and wanted to see how she could do without the medication. Taking 1000 mg of metformin does cause GI upset. Will decrease to 500 mg BID and start on glimepiride. She has not been monitoring her blood glucose levels at home. Insurance would not pay for jardiance which I tried to fill for her last month.  Lab Results  Component Value Date   HGBA1C 10.9 (H)  07/20/2020      Review of Systems  Constitutional: Negative for fever, malaise/fatigue and weight loss.  HENT: Negative.  Negative for nosebleeds.   Eyes: Negative.  Negative for blurred vision, double vision and photophobia.  Respiratory: Negative.  Negative for cough and shortness of breath.   Cardiovascular: Negative.  Negative for chest pain, palpitations and leg swelling.  Gastrointestinal: Negative.  Negative for heartburn, nausea and vomiting.  Genitourinary: Negative.   Musculoskeletal: Positive for back pain and myalgias.  Skin: Negative.   Neurological: Negative.  Negative for dizziness, focal weakness, seizures and headaches.  Endo/Heme/Allergies: Negative.   Psychiatric/Behavioral: Negative.  Negative for suicidal ideas.    Past Medical History:  Diagnosis Date  . Ankle fracture 2016   Right  . Aortic atherosclerosis (HCC)    trace calcific atherosclerosis aortic arch per ct neck done 12-22-17  . Bronchitis   . Diabetes mellitus without complication ([place])   . Hypertension   . OA (osteoarthritis) of shoulder    left shoulder, both knees arthritis    Past Surgical History:  Procedure Laterality Date  . NO PAST SURGERIES      Family History  Problem Relation Age of Onset  . Cancer Mother   . Breast cancer Mother   . Cancer Father   . Colon cancer Father   . Diabetes Son     Social History Reviewed with no changes to be made today.   Outpatient Medications Prior to Visit  Medication Sig Dispense Refill  . atorvastatin (LIPITOR) 40  MG tablet Take 1 tablet (40 mg total) by mouth daily. 90 tablet 1  . cyclobenzaprine (FLEXERIL) 5 MG tablet Take 1 tablet (5 mg total) by mouth at bedtime as needed for muscle spasms. 30 tablet 1  . diazepam (VALIUM) 5 MG tablet Take 30-min prior to MRI.  Do not drive. 2 tablet 0  . estradiol (VIVELLE-DOT) 0.0375 MG/24HR Place 1 patch onto the skin 2 (two) times a week. 8 patch 12  . ibuprofen (ADVIL,MOTRIN) 200 MG tablet Take  400-600 mg by mouth every 6 (six) hours as needed for headache (pain).    . metFORMIN (GLUCOPHAGE) 500 MG tablet Take 2 tablets (1,000 mg total) by mouth 2 (two) times daily with a meal. (Patient taking differently: Take 500 mg by mouth 2 (two) times daily with a meal.) 360 tablet 0  . nortriptyline (PAMELOR) 10 MG capsule Start $RemoveBefor'10mg'pEpjlakxvsOe$  at bedtime for 2 week, then increase to 2 tablet at bedtime 60 capsule 5  . Prasterone (INTRAROSA) 6.5 MG INST Insert one capsule vaginally 28 each 3  . pregabalin (LYRICA) 150 MG capsule Take 1 capsule (150 mg total) by mouth 2 (two) times daily. 60 capsule 2  . progesterone (PROMETRIUM) 200 MG capsule Take 1 capsule (200 mg total) by mouth daily. 30 capsule 3  . ramipril (ALTACE) 2.5 MG capsule Take 1 capsule (2.5 mg total) by mouth daily. 90 capsule 0  . Blood Glucose Monitoring Suppl (CONTOUR BLOOD GLUCOSE SYSTEM) w/Device KIT E11.65 Use as instructed. Check blood glucose level by fingerstick twice per day. 1 kit 0  . empagliflozin (JARDIANCE) 25 MG TABS tablet Take 1 tablet (25 mg total) by mouth daily before breakfast. 30 tablet 1  . glucose blood (TRUE METRIX BLOOD GLUCOSE TEST) test strip Use as instructed 100 each 12  . TRUEplus Lancets 28G MISC Use as instructed. Check blood glucose level by fingerstick twice per day in the am and after dinner 200 each 6   No facility-administered medications prior to visit.    No Known Allergies     Objective:    BP 128/87   Pulse 84   Resp 16   Ht 5' 4.5" (1.638 m)   Wt 197 lb 6.4 oz (89.5 kg)   SpO2 97%   BMI 33.36 kg/m  Wt Readings from Last 3 Encounters:  07/29/20 197 lb 6.4 oz (89.5 kg)  06/01/20 194 lb (88 kg)  04/22/20 195 lb 11.2 oz (88.8 kg)    Physical Exam Constitutional:      Appearance: She is well-developed and well-nourished.  HENT:     Head: Normocephalic and atraumatic.     Right Ear: External ear normal.     Left Ear: External ear normal.     Nose: Nose normal.     Mouth/Throat:      Lips: Pink.     Mouth: Oropharynx is clear and moist. Mucous membranes are moist.     Dentition: Abnormal dentition. No dental tenderness, gingival swelling, dental caries, dental abscesses or gum lesions.     Tongue: No lesions.     Pharynx: No oropharyngeal exudate.  Eyes:     General: No scleral icterus.       Right eye: No discharge.     Extraocular Movements: Extraocular movements intact and EOM normal.     Conjunctiva/sclera: Conjunctivae normal.     Pupils: Pupils are equal, round, and reactive to light.  Neck:     Thyroid: No thyromegaly.     Trachea: No tracheal  deviation.  Cardiovascular:     Rate and Rhythm: Normal rate and regular rhythm.     Pulses: Intact distal pulses.     Heart sounds: Normal heart sounds. No murmur heard. No friction rub.  Pulmonary:     Effort: Pulmonary effort is normal. No accessory muscle usage or respiratory distress.     Breath sounds: Normal breath sounds. No decreased breath sounds, wheezing, rhonchi or rales.  Abdominal:     General: Bowel sounds are normal. There is no distension.     Palpations: Abdomen is soft. There is no mass.     Tenderness: There is no abdominal tenderness. There is no guarding or rebound.  Musculoskeletal:        General: No tenderness, deformity or edema.     Cervical back: Normal range of motion and neck supple.     Lumbar back: Positive right straight leg raise test.  Lymphadenopathy:     Cervical: No cervical adenopathy.  Skin:    General: Skin is warm and dry.     Findings: No erythema.  Neurological:     Mental Status: She is alert and oriented to person, place, and time.     Cranial Nerves: No cranial nerve deficit.     Coordination: Coordination normal.     Deep Tendon Reflexes:     Reflex Scores:      Patellar reflexes are 1+ on the right side and 1+ on the left side. Psychiatric:        Attention and Perception: Attention and perception normal.        Mood and Affect: Mood and affect, mood and  affect normal.        Speech: Speech normal.        Behavior: Behavior normal.        Thought Content: Thought content normal.        Judgment: Judgment normal.          Patient has been counseled extensively about nutrition and exercise as well as the importance of adherence with medications and regular follow-up. The patient was given clear instructions to go to ER or return to medical center if symptoms don't improve, worsen or new problems develop. The patient verbalized understanding.   Follow-up: Return in about 4 weeks (around 08/26/2020) for meter check with luke. See me in 3 months.   Gildardo Pounds, FNP-BC [facility] and [facility] [place], [place]   07/29/2020, 10:12 AM

## 2020-10-14 ENCOUNTER — Other Ambulatory Visit (HOSPITAL_BASED_OUTPATIENT_CLINIC_OR_DEPARTMENT_OTHER): Payer: Self-pay

## 2020-10-14 MED ORDER — IBUPROFEN 800 MG PO TABS
ORAL_TABLET | ORAL | 0 refills | Status: DC
  Filled 2020-10-14: qty 20, 5d supply, fill #0

## 2020-10-14 MED ORDER — CHLORHEXIDINE GLUCONATE 0.12 % MT SOLN
OROMUCOSAL | 0 refills | Status: DC
  Filled 2020-10-14: qty 473, 14d supply, fill #0

## 2020-10-14 MED ORDER — PENICILLIN V POTASSIUM 500 MG PO TABS
ORAL_TABLET | ORAL | 0 refills | Status: DC
  Filled 2020-10-14: qty 28, 7d supply, fill #0

## 2020-10-28 ENCOUNTER — Other Ambulatory Visit: Payer: Self-pay | Admitting: Nurse Practitioner

## 2020-10-28 ENCOUNTER — Other Ambulatory Visit (HOSPITAL_BASED_OUTPATIENT_CLINIC_OR_DEPARTMENT_OTHER): Payer: Self-pay

## 2020-10-28 DIAGNOSIS — E785 Hyperlipidemia, unspecified: Secondary | ICD-10-CM

## 2020-10-28 MED FILL — Glimepiride Tab 4 MG: ORAL | 90 days supply | Qty: 180 | Fill #0 | Status: CN

## 2020-11-06 ENCOUNTER — Other Ambulatory Visit: Payer: Self-pay

## 2020-11-06 ENCOUNTER — Other Ambulatory Visit (HOSPITAL_BASED_OUTPATIENT_CLINIC_OR_DEPARTMENT_OTHER): Payer: Self-pay

## 2020-11-06 ENCOUNTER — Ambulatory Visit
Admission: RE | Admit: 2020-11-06 | Discharge: 2020-11-06 | Disposition: A | Discharge status: Home / Self Care | Payer: Medicaid Other | Source: Ambulatory Visit | Attending: Nurse Practitioner | Admitting: Nurse Practitioner

## 2020-11-06 DIAGNOSIS — Z1231 Encounter for screening mammogram for malignant neoplasm of breast: Secondary | ICD-10-CM

## 2020-11-16 ENCOUNTER — Other Ambulatory Visit (HOSPITAL_BASED_OUTPATIENT_CLINIC_OR_DEPARTMENT_OTHER): Payer: Self-pay

## 2020-11-16 MED FILL — Glimepiride Tab 4 MG: ORAL | 90 days supply | Qty: 180 | Fill #0 | Status: AC

## 2021-01-22 ENCOUNTER — Other Ambulatory Visit (HOSPITAL_BASED_OUTPATIENT_CLINIC_OR_DEPARTMENT_OTHER): Payer: Self-pay

## 2021-01-22 ENCOUNTER — Other Ambulatory Visit: Payer: Self-pay | Admitting: Nurse Practitioner

## 2021-01-22 DIAGNOSIS — E1165 Type 2 diabetes mellitus with hyperglycemia: Secondary | ICD-10-CM

## 2021-01-23 MED ORDER — RAMIPRIL 2.5 MG PO CAPS
ORAL_CAPSULE | ORAL | 0 refills | Status: DC
Start: 2021-01-23 — End: 2021-05-07
  Filled 2021-01-23 – 2021-02-09 (×2): qty 90, 90d supply, fill #0

## 2021-01-25 ENCOUNTER — Other Ambulatory Visit (HOSPITAL_BASED_OUTPATIENT_CLINIC_OR_DEPARTMENT_OTHER): Payer: Self-pay

## 2021-01-26 ENCOUNTER — Other Ambulatory Visit (HOSPITAL_BASED_OUTPATIENT_CLINIC_OR_DEPARTMENT_OTHER): Payer: Self-pay

## 2021-02-05 ENCOUNTER — Other Ambulatory Visit (HOSPITAL_BASED_OUTPATIENT_CLINIC_OR_DEPARTMENT_OTHER): Payer: Self-pay

## 2021-02-09 ENCOUNTER — Other Ambulatory Visit (HOSPITAL_BASED_OUTPATIENT_CLINIC_OR_DEPARTMENT_OTHER): Payer: Self-pay

## 2021-02-10 ENCOUNTER — Other Ambulatory Visit (HOSPITAL_BASED_OUTPATIENT_CLINIC_OR_DEPARTMENT_OTHER): Payer: Self-pay

## 2021-03-09 ENCOUNTER — Encounter: Payer: Self-pay | Admitting: Nurse Practitioner

## 2021-03-10 ENCOUNTER — Ambulatory Visit: Payer: Medicaid Other | Attending: Nurse Practitioner | Admitting: Nurse Practitioner

## 2021-03-10 ENCOUNTER — Other Ambulatory Visit: Payer: Self-pay

## 2021-03-10 ENCOUNTER — Encounter: Payer: Self-pay | Admitting: Nurse Practitioner

## 2021-03-10 VITALS — BP 133/84 | HR 82 | Ht 64.5 in | Wt 191.0 lb

## 2021-03-10 DIAGNOSIS — K219 Gastro-esophageal reflux disease without esophagitis: Secondary | ICD-10-CM | POA: Diagnosis not present

## 2021-03-10 DIAGNOSIS — Z79899 Other long term (current) drug therapy: Secondary | ICD-10-CM | POA: Diagnosis not present

## 2021-03-10 DIAGNOSIS — Z833 Family history of diabetes mellitus: Secondary | ICD-10-CM | POA: Insufficient documentation

## 2021-03-10 DIAGNOSIS — R053 Chronic cough: Secondary | ICD-10-CM | POA: Insufficient documentation

## 2021-03-10 DIAGNOSIS — E785 Hyperlipidemia, unspecified: Secondary | ICD-10-CM | POA: Insufficient documentation

## 2021-03-10 DIAGNOSIS — E1165 Type 2 diabetes mellitus with hyperglycemia: Secondary | ICD-10-CM | POA: Diagnosis present

## 2021-03-10 DIAGNOSIS — Z1211 Encounter for screening for malignant neoplasm of colon: Secondary | ICD-10-CM | POA: Diagnosis not present

## 2021-03-10 DIAGNOSIS — I10 Essential (primary) hypertension: Secondary | ICD-10-CM | POA: Diagnosis not present

## 2021-03-10 DIAGNOSIS — Z7984 Long term (current) use of oral hypoglycemic drugs: Secondary | ICD-10-CM | POA: Insufficient documentation

## 2021-03-10 DIAGNOSIS — J338 Other polyp of sinus: Secondary | ICD-10-CM

## 2021-03-10 DIAGNOSIS — F17210 Nicotine dependence, cigarettes, uncomplicated: Secondary | ICD-10-CM | POA: Insufficient documentation

## 2021-03-10 LAB — GLUCOSE, POCT (MANUAL RESULT ENTRY): POC Glucose: 248 mg/dl — AB (ref 70–99)

## 2021-03-10 LAB — POCT GLYCOSYLATED HEMOGLOBIN (HGB A1C): Hemoglobin A1C: 10.1 % — AB (ref 4.0–5.6)

## 2021-03-10 MED ORDER — BD PEN NEEDLE MINI U/F 31G X 5 MM MISC: 1 refills | Status: DC

## 2021-03-10 MED ORDER — TRULICITY 0.75 MG/0.5ML ~~LOC~~ SOAJ: 0.7500 mg | SUBCUTANEOUS | 1 refills | Status: DC

## 2021-03-10 MED ORDER — OMEPRAZOLE 20 MG PO CPDR: 20.0000 mg | DELAYED_RELEASE_CAPSULE | Freq: Every day | ORAL | 3 refills | Status: DC

## 2021-03-10 MED ORDER — MONTELUKAST SODIUM 10 MG PO TABS: 10.0000 mg | ORAL_TABLET | Freq: Every day | ORAL | 3 refills | Status: DC

## 2021-03-10 MED ORDER — ATORVASTATIN CALCIUM 40 MG PO TABS: 40.0000 mg | ORAL_TABLET | Freq: Every day | ORAL | 1 refills | Status: DC

## 2021-03-10 NOTE — Progress Notes (Signed)
Assessment & Plan:  Ann Foster was seen today for diabetes.  Diagnoses and all orders for this visit:  Type 2 diabetes mellitus with hyperglycemia, unspecified whether long term insulin use (HCC) -     POCT glucose (manual entry) -     POCT glycosylated hemoglobin (Hb A1C) -     Dulaglutide (TRULICITY) 0.25 [place]/7.0WC SOPN; Inject 0.75 mg into the skin once a week. -     Insulin Pen Needle (B-D UF III MINI PEN NEEDLES) 31G X 5 MM MISC; Use as instructed. Inject into the skin once a week Continue blood sugar control as discussed in office today, low carbohydrate diet, and regular physical exercise as tolerated, 150 minutes per week (30 min each day, 5 days per week, or 50 min 3 days per week). Keep blood sugar logs with fasting goal of 90-130 mg/dl, post prandial (after you eat) less than 180.  For Hypoglycemia: BS <60 and Hyperglycemia BS >400; contact the clinic ASAP. Annual eye exams and foot exams are recommended.   Colon cancer screening -     Cancel: Ambulatory referral to Gastroenterology -     Ambulatory referral to Gastroenterology  Dyslipidemia, goal LDL below 70 -     atorvastatin (LIPITOR) 40 MG tablet; Take 1 tablet (40 mg total) by mouth daily. -     Lipid panel INSTRUCTIONS: Work on a low fat, heart healthy diet and participate in regular aerobic exercise program by working out at least 150 minutes per week; 5 days a week-30 minutes per day. Avoid red meat/beef/steak,  fried foods. junk foods, sodas, sugary drinks, unhealthy snacking, alcohol and smoking.  Drink at least 80 oz of water per day and monitor your carbohydrate intake daily.    Polypoid middle turbinate -    NOTED on physical exam at [facility] on 01-30-2021 -     Ambulatory referral to ENT  Chronic cough -     montelukast (SINGULAIR) 10 MG tablet; Take 1 tablet (10 mg total) by mouth at bedtime.  GERD without esophagitis -     omeprazole (PRILOSEC) 20 MG capsule; Take 1 capsule (20 mg total) by mouth  daily. INSTRUCTIONS: Avoid GERD Triggers: acidic, spicy or fried foods, caffeine, coffee, sodas,  alcohol and chocolate.     Patient has been counseled on age-appropriate routine health concerns for screening and prevention. These are reviewed and up-to-date. Referrals have been placed accordingly. Immunizations are up-to-date or declined.    Subjective:   Chief Complaint  Patient presents with   Diabetes   Diabetes Pertinent negatives for hypoglycemia include no dizziness, headaches or seizures. Pertinent negatives for diabetes include no blurred vision, no chest pain and no weight loss.  Ann Foster 55 y.o. female presents to office today for follow up to DM and HTN.  She has a past medical history of Ankle fracture (2016), Aortic atherosclerosis, Bronchitis, DM2, Hypertension, and OA of shoulder.   Recently diagnosed with Uveitis. Had follow up with ophthalmologist on 03-09-2021 with Marcial Pacas. Continuing eye drops for now.   DM 2 Home readings 190-200s. Today's glucose reading >200. Sometimes takes metformin 1000 mg in the pm and 500 mg in the am for glucose control. She is currently prescribed metformin 500 mg BID and glimepiride. TRULICITY 3.76 mg weekly started today.  She is taking ACE and Statin. LDL not at goal with atorvastatin 40 mg daily.  Lab Results  Component Value Date   HGBA1C 10.1 (A) 03/10/2021    Lab Results  Component Value Date   HGBA1C 10.9 (H) 07/20/2020    Lab Results  Component Value Date   LDLCALC 204 (H) 07/20/2020     HTN Blood pressure is well controlled. She is currently taking ramipril 2.5 mg daily as prescribed. Denies chest pain, shortness of breath, palpitations, lightheadedness, dizziness, headaches or BLE edema.   BP Readings from Last 3 Encounters:  03/10/21 133/84  07/29/20 128/87  06/01/20 126/89     Cough Chronic dry cough. She does continue to smoke a few cigarettes today.     Review of Systems  Constitutional:   Negative for fever, malaise/fatigue and weight loss.  HENT: Negative.  Negative for nosebleeds.   Eyes: Negative.  Negative for blurred vision, double vision and photophobia.  Respiratory:  Positive for cough. Negative for shortness of breath.   Cardiovascular: Negative.  Negative for chest pain, palpitations and leg swelling.  Gastrointestinal:  Positive for heartburn. Negative for abdominal pain, blood in stool, constipation, diarrhea, melena, nausea and vomiting.  Musculoskeletal: Negative.  Negative for myalgias.  Neurological: Negative.  Negative for dizziness, focal weakness, seizures and headaches.  Psychiatric/Behavioral: Negative.  Negative for suicidal ideas.    Past Medical History:  Diagnosis Date   Ankle fracture 2016   Right   Aortic atherosclerosis (HCC)    trace calcific atherosclerosis aortic arch per ct neck done 12-22-17   Bronchitis    Diabetes mellitus without complication (HCC)    Hypertension    OA (osteoarthritis) of shoulder    left shoulder, both knees arthritis    Past Surgical History:  Procedure Laterality Date   NO PAST SURGERIES      Family History  Problem Relation Age of Onset   Cancer Mother    Breast cancer Mother    Cancer Father    Colon cancer Father    Diabetes Son     Social History Reviewed with no changes to be made today.   Outpatient Medications Prior to Visit  Medication Sig Dispense Refill   Accu-Chek Softclix Lancets lancets USE AS INSTRUCTED. INJECT INTO THE SKIN TWICE DAILY E11.65 100 each 3   Blood Glucose Monitoring Suppl (ACCU-CHEK AVIVA PLUS) w/Device KIT Use as instructed. Check blood glucose level by fingerstick twice per day. E11.65 1 kit 0   chlorhexidine (PERIDEX) 0.12 % solution RINSE MOUTH WITH 15ML (1 CAPFUL) FOR 30 SECONDS AM AND PM AFTER TOOTHBRUSHING. EXPECTORATE AFTER RINSING, DO NOT SWALLOW 473 mL 0   cyclobenzaprine (FLEXERIL) 5 MG tablet Take 1 tablet (5 mg total) by mouth at bedtime as needed for muscle  spasms. 30 tablet 1   diazepam (VALIUM) 5 MG tablet Take 30-min prior to MRI.  Do not drive. 2 tablet 0   estradiol (VIVELLE-DOT) 0.0375 MG/24HR PLACE 1 PATCH ONTO THE SKIN 2 (TWO) TIMES A WEEK. 8 patch 12   glimepiride (AMARYL) 4 MG tablet TAKE 2 TABLETS (8 MG TOTAL) BY MOUTH DAILY WITH BREAKFAST. 180 tablet 0   glucose blood test strip USE AS INSTRUCTED. CHECK BLOOD GLUCOSE BY FINGERSTICK TWICE PER DAY. E11.65 100 strip 12   metFORMIN (GLUCOPHAGE) 500 MG tablet TAKE 2 TABLETS (1,000 MG TOTAL) BY MOUTH 2 (TWO) TIMES DAILY WITH A MEAL. (Patient taking differently: Take 500 mg by mouth 2 (two) times daily with a meal.) 360 tablet 0   nortriptyline (PAMELOR) 10 MG capsule TAKE 1 CAPSULE BY MOUTH EVERY NIGHT AT BEDTIME FOR 2 WEEKS THEN INCREASE TO 2 CAPSULES EVERY NIGHT AT BEDTIME 60 capsule 5  Prasterone (INTRAROSA) 6.5 MG INST Insert one capsule vaginally 28 each 3   pregabalin (LYRICA) 150 MG capsule Take 1 capsule (150 mg total) by mouth 2 (two) times daily. 60 capsule 2   progesterone (PROMETRIUM) 200 MG capsule TAKE 1 CAPSULE (200 MG TOTAL) BY MOUTH DAILY. 30 capsule 3   ramipril (ALTACE) 2.5 MG capsule TAKE 1 CAPSULE (2.5 MG TOTAL) BY MOUTH DAILY. **NEED OFFICE VISIT FOR FURTHER REFILLS** 90 capsule 0   Blood Glucose Monitoring Suppl (CONTOUR NEXT MONITOR) w/Device KIT USE AS DIRECTED 1 kit 0   penicillin v potassium (VEETID) 500 MG tablet TAKE 1 TABLET 4 TIMES DAILY UNTIL GONE. 28 tablet 0   atorvastatin (LIPITOR) 40 MG tablet Take 1 tablet (40 mg total) by mouth daily. 90 tablet 1   ibuprofen (ADVIL) 800 MG tablet TAKE 1 TABLET EVERY 6 TO 8 HOURS AS NEEDED. (Patient not taking: Reported on 03/10/2021) 20 tablet 0   ibuprofen (ADVIL,MOTRIN) 200 MG tablet Take 400-600 mg by mouth every 6 (six) hours as needed for headache (pain). (Patient not taking: Reported on 03/10/2021)     No facility-administered medications prior to visit.    No Known Allergies     Objective:    BP 133/84    Pulse 82   Ht 5' 4.5" (1.638 m)   Wt 191 lb (86.6 kg)   SpO2 100%   BMI 32.28 kg/m  Wt Readings from Last 3 Encounters:  03/10/21 191 lb (86.6 kg)  07/29/20 197 lb 6.4 oz (89.5 kg)  06/01/20 194 lb (88 kg)    Physical Exam Vitals and nursing note reviewed.  Constitutional:      Appearance: She is well-developed.  HENT:     Head: Normocephalic and atraumatic.  Cardiovascular:     Rate and Rhythm: Normal rate and regular rhythm.     Heart sounds: Normal heart sounds. No murmur heard.   No friction rub. No gallop.  Pulmonary:     Effort: Pulmonary effort is normal. No tachypnea or respiratory distress.     Breath sounds: Normal breath sounds. No decreased breath sounds, wheezing, rhonchi or rales.  Chest:     Chest wall: No tenderness.  Abdominal:     General: Bowel sounds are normal.     Palpations: Abdomen is soft.  Musculoskeletal:        General: Normal range of motion.     Cervical back: Normal range of motion.  Skin:    General: Skin is warm and dry.  Neurological:     Mental Status: She is alert and oriented to person, place, and time.     Coordination: Coordination normal.  Psychiatric:        Behavior: Behavior normal. Behavior is cooperative.        Thought Content: Thought content normal.        Judgment: Judgment normal.         Patient has been counseled extensively about nutrition and exercise as well as the importance of adherence with medications and regular follow-up. The patient was given clear instructions to go to ER or return to medical center if symptoms don't improve, worsen or new problems develop. The patient verbalized understanding.   Follow-up: Return for 4 weeks [place]. See me in 3 months.   Gildardo Pounds, FNP-BC [facility] and [place], [place]   03/10/2021, 10:30 PM

## 2021-03-11 ENCOUNTER — Telehealth: Payer: Self-pay | Admitting: Nurse Practitioner

## 2021-03-11 ENCOUNTER — Other Ambulatory Visit: Payer: Self-pay

## 2021-03-11 LAB — LIPID PANEL
Chol/HDL Ratio: 7.6 ratio — ABNORMAL HIGH (ref 0.0–4.4)
Cholesterol, Total: 298 mg/dL — ABNORMAL HIGH (ref 100–199)
HDL: 39 mg/dL — ABNORMAL LOW (ref >39)
LDL Chol Calc (NIH): 214 mg/dL — ABNORMAL HIGH (ref 0–99)
Triglycerides: 225 mg/dL — ABNORMAL HIGH (ref 0–149)
VLDL Cholesterol Cal: 45 mg/dL — ABNORMAL HIGH (ref 5–40)

## 2021-03-11 NOTE — Telephone Encounter (Signed)
Pt stated the pharmacy had an issue with giving her the RX for Dulaglutide (TRULICITY) 9.75 OI/3.2PQ SOPN / she is not sure why but thinks her insurance will not cover it/ please advise  Pt also asked if she is to continue taking her Metformin /please advise

## 2021-03-11 NOTE — Telephone Encounter (Signed)
PA for Trulicity approved until 03/11/22.  Pharmacy notified.

## 2021-03-11 NOTE — Telephone Encounter (Signed)
Continue metformin.

## 2021-03-12 NOTE — Telephone Encounter (Signed)
Pt informed by mychart to continue Metformin.

## 2021-03-15 ENCOUNTER — Encounter: Payer: Self-pay | Admitting: *Deleted

## 2021-03-15 ENCOUNTER — Ambulatory Visit: Payer: Medicaid Other

## 2021-03-15 ENCOUNTER — Ambulatory Visit: Payer: Self-pay | Admitting: *Deleted

## 2021-03-15 VITALS — Ht 64.5 in | Wt 191.0 lb

## 2021-03-15 DIAGNOSIS — Z1211 Encounter for screening for malignant neoplasm of colon: Secondary | ICD-10-CM

## 2021-03-15 DIAGNOSIS — Z800 Family history of malignant neoplasm of digestive organs: Secondary | ICD-10-CM

## 2021-03-15 MED ORDER — SUPREP BOWEL PREP KIT 17.5-3.13-1.6 GM/177ML PO SOLN: 1.0000 | ORAL | 0 refills | Status: DC

## 2021-03-15 NOTE — Telephone Encounter (Signed)
Patient would like to know if she can use the Insulin Pen Needle (B-D UF III MINI PEN NEEDLES) 31G X 5 MM MISC for her Dulaglutide (TRULICITY) 5.46 TK/3.5WS SOPN   Called patient to review questions regarding use of mini pen needles and taking trulicity. Reviewed with patient truliciltiy pen should have its own needle and once pen is used dispose of pen per PCP request. Recommended patient contact pharmacy to see what mini pen needles 31GX 5MM is needed for due to no other insulin ordered. Patient also would like clarification if she is to continue taking metformin 500 mg BID and glimepiride 4 mg . Metformin not on current med list. Last OV notes 03/10/21, do not address stopping / discontinuing metformin. Started trulicity today  at 568 am . Please advise. Care advise given. Patient verbalized understanding of care advise and to call back or go to [facility] or ED if symptoms arise .

## 2021-03-15 NOTE — Telephone Encounter (Signed)
Patient would like to know if she can use the Insulin Pen Needle (B-D UF III MINI PEN NEEDLES) 31G X 5 MM MISC for her Dulaglutide (TRULICITY) 1.93 XT/0.2IO SOPN  Reason for Disposition  [1] Caller has URGENT medicine question about med that PCP or specialist prescribed AND [2] triager unable to answer question  Answer Assessment - Initial Assessment Questions 1. NAME of MEDICATION: "What medicine are you calling about?"     Metformin, glimerpiride, trulicity, insulin mini pen needles  2. QUESTION: "What is your question?" (e.g., double dose of medicine, side effect)     Do I continue to take metformin and glimerpiride with trulicilty? 3. PRESCRIBING HCP: "Who prescribed it?" Reason: if prescribed by specialist, call should be referred to that group.     PCP 4. SYMPTOMS: "Do you have any symptoms?"     none 5. SEVERITY: If symptoms are present, ask "Are they mild, moderate or severe?"     na 6. PREGNANCY:  "Is there any chance that you are pregnant?" "When was your last menstrual period?"     na  Protocols used: Medication Question Call-A-AH

## 2021-03-15 NOTE — Progress Notes (Signed)
No egg or soy allergy known to patient  No issues with past sedation with any surgeries or procedures Patient denies ever being told they had issues or difficulty with intubation  No FH of Malignant Hyperthermia No diet pills per patient No home 02 use per patient  No blood thinners per patient  Pt denies issues with constipation  No A fib or A flutter  COVID 19 guidelines implemented in [facility] today with Pt and RN     Due to the COVID-19 pandemic we are asking patients to follow certain guidelines.  Pt aware of COVID protocols and [facility] guidelines

## 2021-03-16 ENCOUNTER — Encounter: Payer: Self-pay | Admitting: Nurse Practitioner

## 2021-03-16 NOTE — Telephone Encounter (Signed)
Please refer to mychart message sent to patient regarding trulicity and her other medications.

## 2021-03-16 NOTE — Telephone Encounter (Signed)
Spoke to pt and informed her of the information of her medication. She stated she had no further questions.

## 2021-03-17 ENCOUNTER — Encounter: Payer: Self-pay | Admitting: *Deleted

## 2021-03-17 ENCOUNTER — Telehealth: Payer: Self-pay | Admitting: Gastroenterology

## 2021-03-17 NOTE — Telephone Encounter (Signed)
Inbound call from patient. States suprep needs prior authorization.

## 2021-03-17 NOTE — Telephone Encounter (Signed)
I have discussed with patient that we will change her over to Plenvu since insurance will not approve Suprep. She will come pick up sample at our office. I also advised of new instructions and will also send via mychart/USPS. She verbalizes understanding.

## 2021-03-31 ENCOUNTER — Ambulatory Visit (AMBULATORY_SURGERY_CENTER): Payer: Medicaid Other | Admitting: Gastroenterology

## 2021-03-31 ENCOUNTER — Encounter: Payer: Self-pay | Admitting: Gastroenterology

## 2021-03-31 ENCOUNTER — Other Ambulatory Visit: Payer: Self-pay

## 2021-03-31 VITALS — BP 126/80 | HR 73 | Temp 98.1°F | Resp 10 | Ht 64.5 in | Wt 191.0 lb

## 2021-03-31 DIAGNOSIS — D120 Benign neoplasm of cecum: Secondary | ICD-10-CM

## 2021-03-31 DIAGNOSIS — Z800 Family history of malignant neoplasm of digestive organs: Secondary | ICD-10-CM

## 2021-03-31 DIAGNOSIS — Z1211 Encounter for screening for malignant neoplasm of colon: Secondary | ICD-10-CM

## 2021-03-31 DIAGNOSIS — D123 Benign neoplasm of transverse colon: Secondary | ICD-10-CM

## 2021-03-31 MED ORDER — SODIUM CHLORIDE 0.9 % IV SOLN: 500.0000 mL | Freq: Once | INTRAVENOUS | Status: DC

## 2021-03-31 NOTE — Progress Notes (Signed)
Sedate, gd SR, tolerated procedure well, VSS, report to RN 

## 2021-03-31 NOTE — Progress Notes (Signed)
[facility] History and Physical   Primary Care Physician:  Gildardo Pounds, NP   Reason for Procedure:  Family history of colon cancer  Plan:    Screening colonoscopy with possible interventions as needed     HPI: Ann Foster is a very pleasant 55 y.o. female here for screening colonoscopy. Denies any nausea, vomiting, abdominal pain, melena or bright red blood per rectum  The risks and benefits as well as alternatives of endoscopic procedure(s) have been discussed and reviewed. All questions answered. The patient agrees to proceed.    Past Medical History:  Diagnosis Date   Ankle fracture 2016   Right   Aortic atherosclerosis (HCC)    trace calcific atherosclerosis aortic arch per ct neck done 12-22-17   Bronchitis    Diabetes mellitus without complication (HCC)    Hypertension    OA (osteoarthritis) of shoulder    left shoulder, both knees arthritis   Osteoarthritis, knee     Past Surgical History:  Procedure Laterality Date   COLONOSCOPY     NO PAST SURGERIES      Prior to Admission medications   Medication Sig Start Date End Date Taking? Authorizing Provider  Accu-Chek Softclix Lancets lancets USE AS INSTRUCTED. INJECT INTO THE SKIN TWICE DAILY E11.65 07/29/20 07/29/21 Yes Gildardo Pounds, NP  atorvastatin (LIPITOR) 40 MG tablet Take 1 tablet (40 mg total) by mouth daily. 03/10/21 06/08/21 Yes Gildardo Pounds, NP  Blood Glucose Monitoring Suppl (ACCU-CHEK AVIVA PLUS) w/Device KIT Use as instructed. Check blood glucose level by fingerstick twice per day. E11.65 07/29/20  Yes Gildardo Pounds, NP  budesonide (PULMICORT) 0.25 MG/2ML nebulizer solution SMARTSIG:2 Milliliter(s) Via Nebulizer Daily 03/24/21  Yes [provider]  Dulaglutide (TRULICITY) 9.92 EQ/6.8TM SOPN Inject 0.75 mg into the skin once a week. 03/10/21 04/09/21 Yes Gildardo Pounds, NP  fluticasone (FLONASE) 50 MCG/ACT nasal spray Place 1 spray into both nostrils 2 (two) times  daily. 03/22/21  Yes [provider]  glimepiride (AMARYL) 4 MG tablet TAKE 2 TABLETS (8 MG TOTAL) BY MOUTH DAILY WITH BREAKFAST. 07/29/20 07/29/21 Yes Gildardo Pounds, NP  glucose blood test strip USE AS INSTRUCTED. CHECK BLOOD GLUCOSE BY FINGERSTICK TWICE PER DAY. E11.65 07/29/20 07/29/21 Yes Gildardo Pounds, NP  Insulin Pen Needle (B-D UF III MINI PEN NEEDLES) 31G X 5 MM MISC Use as instructed. Inject into the skin once a week 03/10/21  Yes Gildardo Pounds, NP  montelukast (SINGULAIR) 10 MG tablet Take 1 tablet (10 mg total) by mouth at bedtime. 03/10/21  Yes Gildardo Pounds, NP  nortriptyline (PAMELOR) 10 MG capsule TAKE 1 CAPSULE BY MOUTH EVERY NIGHT AT BEDTIME FOR 2 WEEKS THEN INCREASE TO 2 CAPSULES EVERY NIGHT AT BEDTIME Patient taking differently: Take 10 mg by mouth at bedtime. 06/01/20 06/01/21 Yes Patel, Donika K, DO  omeprazole (PRILOSEC) 20 MG capsule  03/10/21  Yes [provider]  pregabalin (LYRICA) 150 MG capsule Take 1 capsule (150 mg total) by mouth 2 (two) times daily. 01/23/20  Yes Trula Slade, DPM  ramipril (ALTACE) 2.5 MG capsule TAKE 1 CAPSULE (2.5 MG TOTAL) BY MOUTH DAILY. **NEED OFFICE VISIT FOR FURTHER REFILLS** 01/23/21 01/23/22 Yes Gildardo Pounds, NP  cyclobenzaprine (FLEXERIL) 5 MG tablet Take 1 tablet (5 mg total) by mouth at bedtime as needed for muscle spasms. 01/31/20   Narda Amber K, DO  cyclopentolate (CYCLODRYL,CYCLOGYL) 1 % ophthalmic solution SMARTSIG:1 Drop(s) Left Eye PRN 01/30/21   [provider]  estradiol (VIVELLE-DOT) 0.0375 MG/24HR PLACE 1 PATCH ONTO THE SKIN 2 (TWO) TIMES A WEEK. 04/22/20 04/22/21  Guss Bunde, MD  Prasterone [place]) 6.5 MG INST Insert one capsule vaginally 10/24/19   Guss Bunde, MD  PRED MILD 0.12 % ophthalmic suspension Place 1 drop into the left eye 4 (four) times daily. 02/10/21   [provider]  prednisoLONE acetate (PRED FORTE) 1 % ophthalmic suspension Place 1 drop into the left eye 4  (four) times daily. 01/30/21   [provider]  progesterone (PROMETRIUM) 200 MG capsule TAKE 1 CAPSULE (200 MG TOTAL) BY MOUTH DAILY. 04/22/20 04/22/21  Guss Bunde, MD  lisinopril (ZESTRIL) 2.5 MG tablet Take 1 tablet (2.5 mg total) by mouth 2 (two) times daily. 09/12/19 04/22/20  Charlott Rakes, MD    Current Outpatient Medications  Medication Sig Dispense Refill   Accu-Chek Softclix Lancets lancets USE AS INSTRUCTED. INJECT INTO THE SKIN TWICE DAILY E11.65 100 each 3   atorvastatin (LIPITOR) 40 MG tablet Take 1 tablet (40 mg total) by mouth daily. 90 tablet 1   Blood Glucose Monitoring Suppl (ACCU-CHEK AVIVA PLUS) w/Device KIT Use as instructed. Check blood glucose level by fingerstick twice per day. E11.65 1 kit 0   budesonide (PULMICORT) 0.25 MG/2ML nebulizer solution SMARTSIG:2 Milliliter(s) Via Nebulizer Daily     Dulaglutide (TRULICITY) 6.37 CH/8.8FO SOPN Inject 0.75 mg into the skin once a week. 2 mL 1   fluticasone (FLONASE) 50 MCG/ACT nasal spray Place 1 spray into both nostrils 2 (two) times daily.     glimepiride (AMARYL) 4 MG tablet TAKE 2 TABLETS (8 MG TOTAL) BY MOUTH DAILY WITH BREAKFAST. 180 tablet 0   glucose blood test strip USE AS INSTRUCTED. CHECK BLOOD GLUCOSE BY FINGERSTICK TWICE PER DAY. E11.65 100 strip 12   Insulin Pen Needle (B-D UF III MINI PEN NEEDLES) 31G X 5 MM MISC Use as instructed. Inject into the skin once a week 100 each 1   montelukast (SINGULAIR) 10 MG tablet Take 1 tablet (10 mg total) by mouth at bedtime. 30 tablet 3   nortriptyline (PAMELOR) 10 MG capsule TAKE 1 CAPSULE BY MOUTH EVERY NIGHT AT BEDTIME FOR 2 WEEKS THEN INCREASE TO 2 CAPSULES EVERY NIGHT AT BEDTIME (Patient taking differently: Take 10 mg by mouth at bedtime.) 60 capsule 5   omeprazole (PRILOSEC) 20 MG capsule      pregabalin (LYRICA) 150 MG capsule Take 1 capsule (150 mg total) by mouth 2 (two) times daily. 60 capsule 2   ramipril (ALTACE) 2.5 MG capsule TAKE 1 CAPSULE (2.5 MG  TOTAL) BY MOUTH DAILY. **NEED OFFICE VISIT FOR FURTHER REFILLS** 90 capsule 0   cyclobenzaprine (FLEXERIL) 5 MG tablet Take 1 tablet (5 mg total) by mouth at bedtime as needed for muscle spasms. 30 tablet 1   cyclopentolate (CYCLODRYL,CYCLOGYL) 1 % ophthalmic solution SMARTSIG:1 Drop(s) Left Eye PRN     estradiol (VIVELLE-DOT) 0.0375 MG/24HR PLACE 1 PATCH ONTO THE SKIN 2 (TWO) TIMES A WEEK. 8 patch 12   Prasterone (INTRAROSA) 6.5 MG INST Insert one capsule vaginally 28 each 3   PRED MILD 0.12 % ophthalmic suspension Place 1 drop into the left eye 4 (four) times daily.     prednisoLONE acetate (PRED FORTE) 1 % ophthalmic suspension Place 1 drop into the left eye 4 (four) times daily.     progesterone (PROMETRIUM) 200 MG capsule TAKE 1 CAPSULE (200 MG TOTAL) BY MOUTH DAILY. 30 capsule 3   Current Facility-Administered  Medications  Medication Dose Route Frequency Provider Last Rate Last Admin   0.9 %  sodium chloride infusion  500 mL Intravenous Once Mauri Pole, MD        Allergies as of 03/31/2021   (No Known Allergies)    Family History  Problem Relation Age of Onset   Breast cancer Mother    Colon cancer Father 28   Diabetes Son    Esophageal cancer Neg Hx    Stomach cancer Neg Hx    Liver disease Neg Hx    Pancreatic cancer Neg Hx    Rectal cancer Neg Hx     Social History   Socioeconomic History   Marital status: Single    Spouse name: Not on file   Number of children: 3   Years of education: Not on file   Highest education level: High school graduate  Occupational History   Not on file  Tobacco Use   Smoking status: Former    Packs/day: 0.30    Types: Cigarettes    Quit date: 11/28/2019    Years since quitting: 1.3   Smokeless tobacco: Never   Tobacco comments:    smokes 1-2 cigarettes day now  Vaping Use   Vaping Use: Never used  Substance and Sexual Activity   Alcohol use: Yes    Comment: Occasional   Drug use: No   Sexual activity: Not Currently     Birth control/protection: Condom  Other Topics Concern   Not on file  Social History Narrative   Left Handed   One story home    Social Determinants of Health   Financial Resource Strain: Not on file  Food Insecurity: Not on file  Transportation Needs: Not on file  Physical Activity: Not on file  Stress: Not on file  Social Connections: Not on file  Intimate Partner Violence: Not on file    Review of Systems:  All other review of systems negative except as mentioned in the HPI.  Physical Exam: Vital signs in last 24 hours: BP (!) 110/52   Pulse 81   Temp 98.1 F (36.7 C) (Temporal)   Ht 5' 4.5" (1.638 m)   Wt 191 lb (86.6 kg)   SpO2 100%   BMI 32.28 kg/m     General:   Alert, NAD Lungs:  Clear .   Heart:  Regular rate and rhythm Abdomen:  Soft, nontender and nondistended. Neuro/Psych:  Alert and cooperative. Normal mood and affect. A and O x 3  Reviewed labs, radiology imaging, old records and pertinent past GI work up  Patient is appropriate for planned procedure(s) and anesthesia in an ambulatory setting   K. Denzil Magnuson , MD [phone]

## 2021-03-31 NOTE — Op Note (Signed)
[place] Patient Name: Ann Foster Procedure Date: 03/31/2021 2:59 PM MRN: [id number] Endoscopist: Mauri Pole , MD Age: 55 Referring MD:  Date of Birth: 04/04/1966 Gender: Female Account #: [id number] Procedure:                Colonoscopy Indications:              Screening for colorectal malignant neoplasm Medicines:                Monitored Anesthesia Care Procedure:                Pre-Anesthesia Assessment:                           - Prior to the procedure, a History and Physical                            was performed, and patient medications and                            allergies were reviewed. The patient's tolerance of                            previous anesthesia was also reviewed. The risks                            and benefits of the procedure and the sedation                            options and risks were discussed with the patient.                            All questions were answered, and informed consent                            was obtained. Prior Anticoagulants: The patient has                            taken no previous anticoagulant or antiplatelet                            agents. ASA Grade Assessment: II - A patient with                            mild systemic disease. After reviewing the risks                            and benefits, the patient was deemed in                            satisfactory condition to undergo the procedure.                           After obtaining informed consent, the colonoscope  was passed under direct vision. Throughout the                            procedure, the patient's blood pressure, pulse, and                            oxygen saturations were monitored continuously. The                            Olympus PCF-H190DL ([id number]) Colonoscope was                            introduced through the anus and advanced to the the                            cecum,  identified by appendiceal orifice and                            ileocecal valve. The colonoscopy was performed                            without difficulty. The patient tolerated the                            procedure well. The quality of the bowel                            preparation was good. The ileocecal valve,                            appendiceal orifice, and rectum were photographed. Scope In: 3:15:58 PM Scope Out: 3:35:26 PM Scope Withdrawal Time: 0 hours 11 minutes 55 seconds  Total Procedure Duration: 0 hours 19 minutes 28 seconds  Findings:                 The perianal and digital rectal examinations were                            normal.                           A 4 mm polyp was found in the transverse colon. The                            polyp was sessile. The polyp was removed with a                            cold snare. Resection and retrieval were complete.                           Two sessile polyps were found in the cecum. The                            polyps were 1 to 2 mm in size. These  polyps were                            removed with a cold biopsy forceps. Resection and                            retrieval were complete.                           Non-bleeding external and internal hemorrhoids were                            found during retroflexion. The hemorrhoids were                            small. Complications:            No immediate complications. Estimated Blood Loss:     Estimated blood loss was minimal. Impression:               - One 4 mm polyp in the transverse colon, removed                            with a cold snare. Resected and retrieved.                           - Two 1 to 2 mm polyps in the cecum, removed with a                            cold biopsy forceps. Resected and retrieved.                           - Non-bleeding external and internal hemorrhoids. Recommendation:           - Patient has a contact number available for                             emergencies. The signs and symptoms of potential                            delayed complications were discussed with the                            patient. Return to normal activities tomorrow.                            Written discharge instructions were provided to the                            patient.                           - Resume previous diet.                           - Continue present medications.                           -  Await pathology results.                           - Repeat colonoscopy in 5-10 years for surveillance                            based on pathology results. Mauri Pole, MD 03/31/2021 3:40:15 PM This report has been signed electronically.

## 2021-03-31 NOTE — Progress Notes (Signed)
VS-CW  Pt's states no medical or surgical changes since previsit or office visit.  

## 2021-03-31 NOTE — Progress Notes (Signed)
Called to room to assist during endoscopic procedure.  Patient ID and intended procedure confirmed with present staff. Received instructions for my participation in the procedure from the performing physician.  

## 2021-03-31 NOTE — Patient Instructions (Signed)
YOU HAD AN ENDOSCOPIC PROCEDURE TODAY: Refer to the procedure report and other information in the discharge instructions given to you for any specific questions about what was found during the examination. If this information does not answer your questions, please call [place] office at [phone] to clarify.  ° °YOU SHOULD EXPECT: Some feelings of bloating in the abdomen. Passage of more gas than usual. Walking can help get rid of the air that was put into your GI tract during the procedure and reduce the bloating. If you had a lower endoscopy (such as a colonoscopy or flexible sigmoidoscopy) you may notice spotting of blood in your stool or on the toilet paper. Some abdominal soreness may be present for a day or two, also. ° °DIET: Your first meal following the procedure should be a light meal and then it is ok to progress to your normal diet. A half-sandwich or bowl of soup is an example of a good first meal. Heavy or fried foods are harder to digest and may make you feel nauseous or bloated. Drink plenty of fluids but you should avoid alcoholic beverages for 24 hours. If you had a esophageal dilation, please see attached instructions for diet.   ° °ACTIVITY: Your care partner should take you home directly after the procedure. You should plan to take it easy, moving slowly for the rest of the day. You can resume normal activity the day after the procedure however YOU SHOULD NOT DRIVE, use power tools, machinery or perform tasks that involve climbing or major physical exertion for 24 hours (because of the sedation medicines used during the test).  ° °SYMPTOMS TO REPORT IMMEDIATELY: °A gastroenterologist can be reached at any hour. Please call [phone]  for any of the following symptoms:  °Following lower endoscopy (colonoscopy, flexible sigmoidoscopy) °Excessive amounts of blood in the stool  °Significant tenderness, worsening of abdominal pains  °Swelling of the abdomen that is new, acute  °Fever of 100° or  higher  °Following upper endoscopy (EGD, EUS, ERCP, esophageal dilation) °Vomiting of blood or coffee ground material  °New, significant abdominal pain  °New, significant chest pain or pain under the shoulder blades  °Painful or persistently difficult swallowing  °New shortness of breath  °Black, tarry-looking or red, bloody stools ° °FOLLOW UP:  °If any biopsies were taken you will be contacted by phone or by letter within the next 1-3 weeks. Call [phone]  if you have not heard about the biopsies in 3 weeks.  °Please also call with any specific questions about appointments or follow up tests. ° °

## 2021-04-02 ENCOUNTER — Telehealth: Payer: Self-pay

## 2021-04-02 NOTE — Telephone Encounter (Signed)
  Follow up Call-  Call back number 03/31/2021  Post procedure Call Back phone  # [phone]  Permission to leave phone message Yes  Some recent data might be hidden     Patient questions:  Do you have a fever, pain , or abdominal swelling? No. Pain Score  0 *  Have you tolerated food without any problems? Yes.    Have you been able to return to your normal activities? Yes.    Do you have any questions about your discharge instructions: Diet   No. Medications  No. Follow up visit  No.  Do you have questions or concerns about your Care? No.  Actions: * If pain score is 4 or above: No action needed, pain <4.

## 2021-04-02 NOTE — Telephone Encounter (Signed)
Called [phone] and left a message we tried to reach pt for a follow up call. maw

## 2021-04-06 ENCOUNTER — Encounter: Payer: Self-pay | Admitting: Gastroenterology

## 2021-04-06 NOTE — Progress Notes (Signed)
PCP: [name]  S:     Patient arrives in good spirits.  Presents for diabetes evaluation, education, and management. Patient was referred and last seen by Primary Care Provider on 03/10/2021.   Family/Social History:  -Current smoker: reports "cutting back" with intention of quitting. Informed patient of different means to quit smoking and encoiuraged her for her efforts.  Insurance coverage/medication affordability: [place] [employer]. Patient reported recently changed insurance plan.  Medication adherence reported, however patient is taking double doses of atorvastatin .    Current diabetes medications include:  -Trulicity 8.93 mg weekly -Glimepiride 8 mg daily -Metformin 500 mg QHS  Previous medications include Jardiance (stopped due to coverage)  Current hypertension medications include:  -Ramipril 2.5 mg daily  Current hyperlipidemia medications include:  Atorvastatin 40 mg daily  Patient reports hypoglycemic events.After describing symptoms of hypoglycemia, patient reports symptoms of shakiness, nervousness, and fatigue this morning with blood glucose of 71. Patient was educated on what to do if symptoms occur again and able to teach back hypoglycemic protocol.   Patient reported dietary habits: Eats 2-3 meals/day Endorses a reduction in overall appetite after starting Trulicity. Patient reports some unhealthy dietary habits with fairly high intake of carbs and sweets (popsicles, jello, etc), but also reports eating salads and salmon. Patient was educated on My Plate model for diet.  Patient-reported exercise habits: occasional. Patient was educated on aiming for 150 minutes of moderate intensity exercise per week..    Patient denies nocturia (nighttime urination).  Patient denies neuropathy (nerve pain). Patient denies visual changes. Patient denies self foot exams.     O:   Lab Results  Component Value Date   HGBA1C 10.1 (A) 03/10/2021   There were no vitals  filed for this visit.  Lipid Panel     Component Value Date/Time   CHOL 298 (H) 03/10/2021 1443   TRIG 225 (H) 03/10/2021 1443   HDL 39 (L) 03/10/2021 1443   CHOLHDL 7.6 (H) 03/10/2021 1443   LDLCALC 214 (H) 03/10/2021 1443    Home fasting blood sugars: Last week: 71, 102, 131, 94, 109, 97, 160  Patient reported distress with checking blood glucose twice daily due to sore fingers. Patient was informed with increased control, BG can be checked once daily alternating fasting and 2-hour post meal check and when symptoms of hypoglycemia are detected.  Clinical Atherosclerotic Cardiovascular Disease (ASCVD): No  The 10-year ASCVD risk score (Arnett DK, et al., 2019) is: 39.9%   Values used to calculate the score:     Age: 55 years     Sex: Female     Is Non-Hispanic African American: Yes     Diabetic: Yes     Tobacco smoker: Yes     Systolic Blood Pressure: 734 mmHg     Is BP treated: Yes     HDL Cholesterol: 39 mg/dL     Total Cholesterol: 298 mg/dL   A/P: Diabetes longstanding currently moderately controlled with much improvement after starting Trulicity. Patient is able to verbalize appropriate hypoglycemia management plan after education. Medication adherence appears adequate.  -Continue Metformin 500 mg at bedtime -Increase Trulicity to 1.5 mg weekly -Stop glimepiride for increase in hypoglycemia. -Extensively discussed pathophysiology of diabetes, recommended lifestyle interventions, dietary effects on blood sugar control -Counseled on s/sx of and management of hypoglycemia -Next A1C anticipated 06/10/20.  -Check Microalbumin/creatinine ratio at next visit  ASCVD risk - primary prevention in patient with diabetes. Last LDL is not controlled. Patient was out of atorvastatin  for 1 month prior to lab draw. ASCVD risk score is >20%  - high intensity statin indicated.  -Patient taking two tablets daily of atorvastatin 40 mg. Continue atorvastatin 80 mg daily, check lipid panel in  one month and consider adding Zetia if LDL remains uncontrolled.  Written patient instructions provided.  Total time in face to face counseling 32 minutes.   Follow up Pharmacist in one month. Will check lipid panel and microalbumin/creatinine ratio.   Next follow up with PCP is in January.  Thank you for allowing pharmacy to participate in this patient's care.  Reatha Harps, PharmD PGY1 Pharmacy Resident 04/07/2021 11:11 AM

## 2021-04-07 ENCOUNTER — Ambulatory Visit: Payer: Medicaid Other | Attending: Nurse Practitioner | Admitting: Pharmacist

## 2021-04-07 ENCOUNTER — Other Ambulatory Visit: Payer: Self-pay

## 2021-04-07 DIAGNOSIS — E1165 Type 2 diabetes mellitus with hyperglycemia: Secondary | ICD-10-CM

## 2021-04-07 MED ORDER — TRULICITY 1.5 MG/0.5ML ~~LOC~~ SOAJ: 1.5000 mg | SUBCUTANEOUS | 2 refills | Status: DC

## 2021-04-07 MED ORDER — TRULICITY 1.5 MG/0.5ML ~~LOC~~ SOAJ
1.5000 mg | SUBCUTANEOUS | 2 refills | Status: DC
  Filled 2021-04-07 – 2021-05-07 (×3): qty 2, 28d supply, fill #0

## 2021-04-07 MED ORDER — ATORVASTATIN CALCIUM 80 MG PO TABS
80.0000 mg | ORAL_TABLET | Freq: Every day | ORAL | 1 refills | Status: DC
  Filled 2021-04-07: qty 90, 90d supply, fill #0

## 2021-04-07 MED ORDER — METFORMIN HCL 500 MG PO TABS
ORAL_TABLET | ORAL | 1 refills | Status: DC
  Filled 2021-04-07: qty 90, 90d supply, fill #0

## 2021-04-07 MED ORDER — ATORVASTATIN CALCIUM 80 MG PO TABS: 80.0000 mg | ORAL_TABLET | Freq: Every day | ORAL | 1 refills | Status: DC

## 2021-05-07 ENCOUNTER — Other Ambulatory Visit: Payer: Self-pay

## 2021-05-07 ENCOUNTER — Ambulatory Visit: Payer: Medicaid Other | Attending: Nurse Practitioner | Admitting: Pharmacist

## 2021-05-07 DIAGNOSIS — R053 Chronic cough: Secondary | ICD-10-CM

## 2021-05-07 DIAGNOSIS — E1165 Type 2 diabetes mellitus with hyperglycemia: Secondary | ICD-10-CM

## 2021-05-07 MED ORDER — ACCU-CHEK GUIDE VI STRP
ORAL_STRIP | 2 refills | Status: DC
  Filled 2021-05-07: qty 100, 90d supply, fill #0

## 2021-05-07 MED ORDER — ACCU-CHEK GUIDE W/DEVICE KIT
PACK | 0 refills | Status: AC
  Filled 2021-05-07: qty 1, 1d supply, fill #0

## 2021-05-07 MED ORDER — MONTELUKAST SODIUM 10 MG PO TABS
10.0000 mg | ORAL_TABLET | Freq: Every day | ORAL | 3 refills | Status: DC
  Filled 2021-05-07: qty 90, 90d supply, fill #0

## 2021-05-07 MED ORDER — RAMIPRIL 2.5 MG PO CAPS
2.5000 mg | ORAL_CAPSULE | Freq: Every day | ORAL | 0 refills | Status: DC
  Filled 2021-05-07: qty 90, 90d supply, fill #0

## 2021-05-07 MED ORDER — ACCU-CHEK SOFTCLIX LANCETS MISC
2 refills | Status: DC
  Filled 2021-05-07: qty 100, 90d supply, fill #0

## 2021-05-07 MED ORDER — ACCU-CHEK GUIDE ME W/DEVICE KIT
PACK | 0 refills | Status: DC
  Filled 2021-05-07: qty 1, fill #0

## 2021-05-07 NOTE — Progress Notes (Signed)
   PCP: [name]  S:     Patient arrives in good spirits.  Presents for diabetes evaluation, education, and management. Patient was referred and last seen by Primary Care Provider on 04/07/2021. Trulicity dose was increased. Glimepiride was stopped dt hypoglycemia.  Family/Social History:  -Current smoker: reports "cutting back" with intention of quitting. Informed patient of different means to quit smoking and encoiuraged her for her efforts.  Insurance coverage/medication affordability: [place] Medicaid. Patient reported recently changed insurance plan.  Medication adherence reported. Pt reports that she can tell a definite decrease in appetite since increasing the dose of Trulicity.   Current diabetes medications include:  -Trulicity 1.5 mg weekly -Metformin 500 mg at bedtime  Current hypertension medications include:  -Ramipril 2.5 mg daily  Current hyperlipidemia medications include:  -Atorvastatin 40 mg daily  Patient denies hypoglycemic events.   Patient reported dietary habits: Eats 2-3 meals/day Endorses a reduction in overall appetite after starting Trulicity. Patient reports some unhealthy dietary habits with fairly high intake of carbs and sweets (popsicles, jello, etc), but also reports eating salads and salmon. Patient was educated on My Plate model for diet.  Patient-reported exercise habits: occasional. Patient was educated on aiming for 150 minutes of moderate intensity exercise per week..    Patient denies nocturia (nighttime urination).  Patient denies neuropathy (nerve pain). Patient denies visual changes. Patient denies self foot exams.     O:   Lab Results  Component Value Date   HGBA1C 10.1 (A) 03/10/2021   There were no vitals filed for this visit.  Lipid Panel     Component Value Date/Time   CHOL 298 (H) 03/10/2021 1443   TRIG 225 (H) 03/10/2021 1443   HDL 39 (L) 03/10/2021 1443   CHOLHDL 7.6 (H) 03/10/2021 1443   LDLCALC 214 (H) 03/10/2021  1443    Home fasting blood sugars: most values are between 90 - 140. No readings >180.   Clinical Atherosclerotic Cardiovascular Disease (ASCVD): No  The 10-year ASCVD risk score (Arnett DK, et al., 2019) is: 39.9%   Values used to calculate the score:     Age: 55 years     Sex: Female     Is Non-Hispanic African American: Yes     Diabetic: Yes     Tobacco smoker: Yes     Systolic Blood Pressure: 419 mmHg     Is BP treated: Yes     HDL Cholesterol: 39 mg/dL     Total Cholesterol: 298 mg/dL   A/P: Diabetes longstanding currently controlled based on reported home CBGs. Patient is able to verbalize appropriate hypoglycemia management plan after education. Medication adherence appears adequate.  -Continue Metformin 500 mg at bedtime -Continue Trulicity 1.5 mg weekly. -Extensively discussed pathophysiology of diabetes, recommended lifestyle interventions, dietary effects on blood sugar control -Counseled on s/sx of and management of hypoglycemia -Next A1C anticipated 06/10/20.  -Check Microalbumin/creatinine ratio at next PCP visit  ASCVD risk - primary prevention in patient with diabetes. Last LDL is not controlled. ASCVD risk score is >20%  - high intensity statin indicated.  -Continue atorvastatin 80 mg daily -Check lipid panel at PCP visit and consider adding Zetia if LDL remains uncontrolled.  Written patient instructions provided.  Total time in face to face counseling 32 minutes.  Follow up PCP in Jan.   Thank you for allowing pharmacy to participate in this patient's care.  Benard Halsted, PharmD, [facility] [place], [place] [phone]

## 2021-06-11 ENCOUNTER — Ambulatory Visit: Payer: Medicaid Other | Attending: Nurse Practitioner | Admitting: Nurse Practitioner

## 2021-06-11 ENCOUNTER — Encounter: Payer: Self-pay | Admitting: Nurse Practitioner

## 2021-06-11 ENCOUNTER — Other Ambulatory Visit: Payer: Self-pay

## 2021-06-11 DIAGNOSIS — D649 Anemia, unspecified: Secondary | ICD-10-CM

## 2021-06-11 DIAGNOSIS — E785 Hyperlipidemia, unspecified: Secondary | ICD-10-CM | POA: Diagnosis not present

## 2021-06-11 DIAGNOSIS — E1165 Type 2 diabetes mellitus with hyperglycemia: Secondary | ICD-10-CM | POA: Diagnosis not present

## 2021-06-11 MED ORDER — METFORMIN HCL 500 MG PO TABS: ORAL_TABLET | ORAL | 1 refills | Status: DC

## 2021-06-11 MED ORDER — RAMIPRIL 2.5 MG PO CAPS: 2.5000 mg | ORAL_CAPSULE | Freq: Every day | ORAL | 1 refills | Status: DC

## 2021-06-11 NOTE — Progress Notes (Signed)
Virtual Visit via Telephone Note Due to national recommendations of social distancing due to [place] 19, telehealth visit is felt to be most appropriate for this patient at this time.  I discussed the limitations, risks, security and privacy concerns of performing an evaluation and management service by telephone and the availability of in person appointments. I also discussed with the patient that there may be a patient responsible charge related to this service. The patient expressed understanding and agreed to proceed.    I connected with Ann Foster on 06/11/21  at   9:10 AM EST  EDT by telephone and verified that I am speaking with the correct person using two identifiers.  Location of Patient: Private Residence   Location of Provider: [place] and [employer] Office    Persons participating in Telemedicine visit: Ann Rankins FNP-BC Ann Foster    History of Present Illness: Telemedicine visit for: HTN She has a past medical history of Ankle fracture (2016), Aortic atherosclerosis, Bronchitis, Diabetes mellitus without complication, Hypertension, OA of shoulder, and Osteoarthritis, knee.   HTN Blood pressure is well controlled. Her home reading today is  BP 127/74. She is taking ramipril 2.5 mg daily as prescribed. BP Readings from Last 3 Encounters:  03/31/21 126/80  03/10/21 133/84  07/29/20 128/87     DM 2 Blood glucose reading 102 today. Previously diabetes has not been controlled. She endorses adherence taking metformin 353 mg daily, trulicity 1.5 mg weekly. LIPIDS not at goal. She is currently prescribed lipitor 80 mg daily.  Lab Results  Component Value Date   HGBA1C 10.1 (A) 03/10/2021    Lab Results  Component Value Date   LDLCALC 214 (H) 03/10/2021    Past Medical History:  Diagnosis Date   Ankle fracture 2016   Right   Aortic atherosclerosis (HCC)    trace calcific atherosclerosis aortic arch per ct neck done 12-22-17   Bronchitis     Diabetes mellitus without complication (HCC)    Hypertension    OA (osteoarthritis) of shoulder    left shoulder, both knees arthritis   Osteoarthritis, knee     Past Surgical History:  Procedure Laterality Date   COLONOSCOPY     NO PAST SURGERIES      Family History  Problem Relation Age of Onset   Breast cancer Mother    Colon cancer Father 33   Diabetes Son    Esophageal cancer Neg Hx    Stomach cancer Neg Hx    Liver disease Neg Hx    Pancreatic cancer Neg Hx    Rectal cancer Neg Hx     Social History   Socioeconomic History   Marital status: Single    Spouse name: Not on file   Number of children: 3   Years of education: Not on file   Highest education level: High school graduate  Occupational History   Not on file  Tobacco Use   Smoking status: Former    Packs/day: 0.30    Types: Cigarettes    Quit date: 11/28/2019    Years since quitting: 1.5   Smokeless tobacco: Never   Tobacco comments:    smokes 1-2 cigarettes day now  Vaping Use   Vaping Use: Never used  Substance and Sexual Activity   Alcohol use: Yes    Comment: Occasional   Drug use: No   Sexual activity: Not Currently    Birth control/protection: Condom  Other Topics Concern   Not on file  Social History  Narrative   Left Handed   One story home    Social Determinants of Health   Financial Resource Strain: Not on file  Food Insecurity: Not on file  Transportation Needs: Not on file  Physical Activity: Not on file  Stress: Not on file  Social Connections: Not on file     Observations/Objective: Awake, alert and oriented x 3   Review of Systems  Constitutional:  Negative for fever, malaise/fatigue and weight loss.  HENT: Negative.  Negative for nosebleeds.   Eyes:  Positive for blurred vision. Negative for double vision (tele) and photophobia.  Respiratory: Negative.  Negative for cough and shortness of breath.   Cardiovascular: Negative.  Negative for chest pain, palpitations and  leg swelling.  Gastrointestinal: Negative.  Negative for heartburn, nausea and vomiting.  Musculoskeletal: Negative.  Negative for myalgias.  Neurological: Negative.  Negative for dizziness, focal weakness, seizures and headaches.  Psychiatric/Behavioral: Negative.  Negative for suicidal ideas.    Assessment and Plan: Diagnoses and all orders for this visit:  Type 2 diabetes mellitus with hyperglycemia, unspecified whether long term insulin use (HCC) -     metFORMIN (GLUCOPHAGE) 500 MG tablet; Take 1 tablet (500 mg) by mouth at bedtime every day. -     ramipril (ALTACE) 2.5 MG capsule; Take 1 capsule (2.5 mg total) by mouth daily. -     Hemoglobin A1c; Future -     CMP14+EGFR; Future Continue blood sugar control as discussed in office today, low carbohydrate diet, and regular physical exercise as tolerated, 150 minutes per week (30 min each day, 5 days per week, or 50 min 3 days per week). Keep blood sugar logs with fasting goal of 90-130 mg/dl, post prandial (after you eat) less than 180.  For Hypoglycemia: BS <60 and Hyperglycemia BS >400; contact the clinic ASAP. Annual eye exams and foot exams are recommended.   Dyslipidemia, goal LDL below 70 -     Lipid panel; Future  Anemia, unspecified type -     CBC; Future     Follow Up Instructions Return in about 3 months (around 09/09/2021).     I discussed the assessment and treatment plan with the patient. The patient was provided an opportunity to ask questions and all were answered. The patient agreed with the plan and demonstrated an understanding of the instructions.   The patient was advised to call back or seek an in-person evaluation if the symptoms worsen or if the condition fails to improve as anticipated.  I provided 15 minutes of non-face-to-face time during this encounter including median intraservice time, reviewing previous notes, labs, imaging, medications and explaining diagnosis and management.  Gildardo Pounds,  FNP-BC

## 2021-07-19 ENCOUNTER — Other Ambulatory Visit: Payer: Self-pay | Admitting: Family Medicine

## 2021-07-20 NOTE — Telephone Encounter (Signed)
Requested Prescriptions  Pending Prescriptions Disp Refills   TRULICITY 1.5 FF/6.3WG SOPN [Pharmacy Med Name: TRULICITY 1.5MG /0.5ML SDP 0.5ML] 2 mL 2    Sig: ADMINISTER 1.5 MG UNDER THE SKIN 1 TIME A WEEK     Endocrinology:  Diabetes - GLP-1 Receptor Agonists Failed - 07/19/2021 10:08 AM      Failed - HBA1C is between 0 and 7.9 and within 180 days    Hemoglobin A1C  Date Value Ref Range Status  03/10/2021 10.1 (A) 4.0 - 5.6 % Final   Hgb A1c MFr Bld  Date Value Ref Range Status  07/20/2020 10.9 (H) 4.8 - 5.6 % Final    Comment:             Prediabetes: 5.7 - 6.4          Diabetes: >6.4          Glycemic control for adults with diabetes: <7.0          Passed - Valid encounter within last 6 months    Recent Outpatient Visits          1 month ago Type 2 diabetes mellitus with hyperglycemia, unspecified whether long term insulin use ([place])   [place], [place] W, NP   2 months ago Chronic cough   [place], Annie Main L, RPH-CPP   3 months ago Type 2 diabetes mellitus with hyperglycemia, unspecified whether long term insulin use [facility])   [place], Annie Main L, RPH-CPP   4 months ago Type 2 diabetes mellitus with hyperglycemia, unspecified whether long term insulin use [facility])   [place], Vernia Buff, NP   11 months ago Encounter for annual physical exam   [place], Vernia Buff, NP      Future Appointments            In 3 weeks Gildardo Pounds, NP [place]

## 2021-07-27 DIAGNOSIS — H18512 Endothelial corneal dystrophy, left eye: Secondary | ICD-10-CM | POA: Insufficient documentation

## 2021-07-27 DIAGNOSIS — H53001 Unspecified amblyopia, right eye: Secondary | ICD-10-CM | POA: Insufficient documentation

## 2021-08-05 ENCOUNTER — Other Ambulatory Visit: Payer: Self-pay | Admitting: Nurse Practitioner

## 2021-08-16 ENCOUNTER — Other Ambulatory Visit: Payer: Self-pay

## 2021-08-16 ENCOUNTER — Encounter: Payer: Self-pay | Admitting: Nurse Practitioner

## 2021-08-16 ENCOUNTER — Ambulatory Visit: Payer: Medicaid Other | Attending: Nurse Practitioner | Admitting: Nurse Practitioner

## 2021-08-16 VITALS — BP 121/75 | HR 76 | Resp 18 | Ht 62.0 in | Wt 190.0 lb

## 2021-08-16 DIAGNOSIS — I10 Essential (primary) hypertension: Secondary | ICD-10-CM | POA: Diagnosis not present

## 2021-08-16 DIAGNOSIS — E785 Hyperlipidemia, unspecified: Secondary | ICD-10-CM | POA: Diagnosis not present

## 2021-08-16 DIAGNOSIS — E1165 Type 2 diabetes mellitus with hyperglycemia: Secondary | ICD-10-CM | POA: Diagnosis not present

## 2021-08-16 DIAGNOSIS — F17210 Nicotine dependence, cigarettes, uncomplicated: Secondary | ICD-10-CM | POA: Diagnosis not present

## 2021-08-16 DIAGNOSIS — D649 Anemia, unspecified: Secondary | ICD-10-CM

## 2021-08-16 DIAGNOSIS — M25551 Pain in right hip: Secondary | ICD-10-CM

## 2021-08-16 DIAGNOSIS — R053 Chronic cough: Secondary | ICD-10-CM | POA: Diagnosis not present

## 2021-08-16 DIAGNOSIS — F17200 Nicotine dependence, unspecified, uncomplicated: Secondary | ICD-10-CM

## 2021-08-16 DIAGNOSIS — G8929 Other chronic pain: Secondary | ICD-10-CM

## 2021-08-16 LAB — POCT GLYCOSYLATED HEMOGLOBIN (HGB A1C): Hemoglobin A1C: 6 % — AB (ref 4.0–5.6)

## 2021-08-16 LAB — GLUCOSE, POCT (MANUAL RESULT ENTRY): POC Glucose: 106 mg/dl — AB (ref 70–99)

## 2021-08-16 MED ORDER — FREESTYLE LIBRE 2 READER DEVI: 0 refills | Status: DC

## 2021-08-16 MED ORDER — NICOTINE 14 MG/24HR TD PT24: 14.0000 mg | MEDICATED_PATCH | Freq: Every day | TRANSDERMAL | 0 refills | Status: AC

## 2021-08-16 MED ORDER — MONTELUKAST SODIUM 10 MG PO TABS
10.0000 mg | ORAL_TABLET | Freq: Every day | ORAL | 3 refills | Status: DC
Start: 2021-08-16 — End: 2023-02-16
  Filled 2021-08-16: qty 30, 30d supply, fill #0

## 2021-08-16 MED ORDER — ATORVASTATIN CALCIUM 80 MG PO TABS
80.0000 mg | ORAL_TABLET | Freq: Every day | ORAL | 1 refills | Status: DC
  Filled 2021-08-16: qty 90, 90d supply, fill #0

## 2021-08-16 MED ORDER — CYCLOBENZAPRINE HCL 5 MG PO TABS
5.0000 mg | ORAL_TABLET | Freq: Every evening | ORAL | 1 refills | Status: DC | PRN
  Filled 2021-08-16: qty 30, 30d supply, fill #0

## 2021-08-16 MED ORDER — TRULICITY 1.5 MG/0.5ML ~~LOC~~ SOAJ
SUBCUTANEOUS | 6 refills | Status: DC
  Filled 2021-08-16: qty 2, 30d supply, fill #0

## 2021-08-16 MED ORDER — FREESTYLE LIBRE 2 SENSOR MISC: 6 refills | Status: DC

## 2021-08-16 NOTE — Progress Notes (Signed)
? ?Assessment & Plan:  ?Ann Foster was seen today for diabetes and hypertension. ? ?Diagnoses and all orders for this visit: ? ?Type 2 diabetes mellitus with hyperglycemia, unspecified whether long term insulin use (HCC) ?-     POCT glycosylated hemoglobin (Hb A1C) ?-     POCT glucose (manual entry) ?-     Dulaglutide (TRULICITY) 1.5 TF/5.7DU SOPN; ADMINISTER 1.5 MG UNDER THE SKIN 1 TIME A WEEK ?-     CMP14+EGFR ?-     Continuous Blood Gluc Sensor (FREESTYLE LIBRE 2 SENSOR) MISC; Use as instructed. Check blood glucose level by fingerstick twice per day. E11.65 ?-     Continuous Blood Gluc Receiver (FREESTYLE LIBRE 2 READER) DEVI; Use as instructed. Check blood glucose level by fingerstick twice per day. E11.65 ? ?Dyslipidemia, goal LDL below 70 ?-     atorvastatin (LIPITOR) 80 MG tablet; Take 1 tablet (80 mg total) by mouth daily. ?-     Lipid panel ? ?Primary hypertension ?-     CMP14+EGFR ? ?Chronic cough ?-     montelukast (SINGULAIR) 10 MG tablet; Take 1 tablet (10 mg total) by mouth at bedtime. ? ?Anemia, unspecified type ?-     CBC ? ?Chronic right hip pain ?-     cyclobenzaprine (FLEXERIL) 5 MG tablet; Take 1 tablet (5 mg total) by mouth at bedtime as needed for muscle spasms. ? ?Tobacco dependence ?-     nicotine (NICODERM CQ - DOSED IN MG/24 HOURS) 14 mg/24hr patch; Place 1 patch (14 mg total) onto the skin daily. ?Glide continues to smoke 7-8 cigarettes per day. ?2. Aashritha was counseled on the dangers of tobacco use, and was advised to quit. We reviewed specific strategies to maximize success, including removing cigarettes and smoking materials from environment, stress management and support of family/friends as well as pharmacological alternatives. ?3. A total of 3 minutes was spent on counseling for smoking cessation and Thelia ?is ready to quit and has chosen NICOTINE PATCHES to start today.  ?4. Patrici was offered Wellbutrin, Chantix, Nicotine patch, Nicotine gum or lozenges.  Due to out of pocket  costs Lezette was also given smoking cessation support and advised to contact: the Smoking Cessation hotline: 1-800-QUIT-NOW.  Gavina was also informed of our Smoking cessation classes which are also available through [facility] by calling [phone] or visit our website at [email or web address]  ?5. Will follow up at next scheduled office visit.   ? ? ?Patient has been counseled on age-appropriate routine health concerns for screening and prevention. These are reviewed and up-to-date. Referrals have been placed accordingly. Immunizations are up-to-date or declined.    ?Subjective:  ? ?Chief Complaint  ?Patient presents with  ? Diabetes  ? Hypertension  ? ?HPI ?Ann Foster 56 y.o. female presents to office today for follow up to HTN and DM ?She has a past medical history of Ankle fracture (2016), Aortic atherosclerosis ([place]), Bronchitis, DM 2, Right Eye cataract,  Hypertension, OA of shoulder, and Osteoarthritis, knee and hip.  ? ? ?DM  ?She stopped taking metformin over a month ago due to GI intolerance when taken with Trulicity. Diabetes is currently well controlled. Notes post prandial readings averaging 120-130s. ?Lab Results  ?Component Value Date  ? HGBA1C 6.0 (A) 08/16/2021  ?  ? ?Blood pressure well controlled with renal dose ACE.  ?BP Readings from Last 3 Encounters:  ?08/16/21 121/75  ?03/31/21 126/80  ?03/10/21 133/84  ?  ?Review of Systems  ?Constitutional:  Negative for fever, malaise/fatigue and weight loss.  ?HENT: Negative.  Negative for nosebleeds.   ?Eyes:  Positive for blurred vision (seeing eye specialist for cataracts). Negative for double vision and photophobia.  ?Respiratory:  Positive for cough (chronic cough). Negative for shortness of breath.   ?Cardiovascular: Negative.  Negative for chest pain, palpitations and leg swelling.  ?Gastrointestinal: Negative.  Negative for heartburn, nausea and vomiting.  ?Musculoskeletal:  Positive for joint pain and myalgias.   ?Neurological: Negative.  Negative for dizziness, focal weakness, seizures and headaches.  ?Psychiatric/Behavioral: Negative.  Negative for suicidal ideas.   ? ?Past Medical History:  ?Diagnosis Date  ? Ankle fracture 2016  ? Right  ? Aortic atherosclerosis ([place])   ? trace calcific atherosclerosis aortic arch per ct neck done 12-22-17  ? Bronchitis   ? Diabetes mellitus without complication ([place])   ? Hypertension   ? OA (osteoarthritis) of shoulder   ? left shoulder, both knees arthritis  ? Osteoarthritis, knee   ? ? ?Past Surgical History:  ?Procedure Laterality Date  ? COLONOSCOPY    ? NO PAST SURGERIES    ? ? ?Family History  ?Problem Relation Age of Onset  ? Breast cancer Mother   ? Colon cancer Father 102  ? Diabetes Son   ? Esophageal cancer Neg Hx   ? Stomach cancer Neg Hx   ? Liver disease Neg Hx   ? Pancreatic cancer Neg Hx   ? Rectal cancer Neg Hx   ? ? ?Social History Reviewed with no changes to be made today.  ? ?Outpatient Medications Prior to Visit  ?Medication Sig Dispense Refill  ? Accu-Chek Softclix Lancets lancets Check blood glucose level by fingerstick once per day. E11.65 100 each 2  ? Blood Glucose Monitoring Suppl (ACCU-CHEK GUIDE) w/Device KIT Check blood glucose level by fingerstick once per day. E11.65 1 kit 0  ? budesonide (PULMICORT) 0.25 MG/2ML nebulizer solution SMARTSIG:2 Milliliter(s) Via Nebulizer Daily    ? cyclopentolate (CYCLODRYL,CYCLOGYL) 1 % ophthalmic solution SMARTSIG:1 Drop(s) Left Eye PRN    ? fluticasone (FLONASE) 50 MCG/ACT nasal spray Place 1 spray into both nostrils 2 (two) times daily.    ? glucose blood (ACCU-CHEK GUIDE) test strip Check blood glucose level by fingerstick once per day. E11.65 100 each 2  ? Insulin Pen Needle (B-D UF III MINI PEN NEEDLES) 31G X 5 MM MISC Use as instructed. Inject into the skin once a week 100 each 1  ? metFORMIN (GLUCOPHAGE) 500 MG tablet Take 1 tablet (500 mg) by mouth at bedtime every day. 90 tablet 1  ? omeprazole (PRILOSEC) 20 MG  capsule TAKE 1 CAPSULE(20 MG) BY MOUTH DAILY 30 capsule 3  ? Prasterone (INTRAROSA) 6.5 MG INST Insert one capsule vaginally 28 each 3  ? PRED MILD 0.12 % ophthalmic suspension Place 1 drop into the left eye 4 (four) times daily.    ? prednisoLONE acetate (PRED FORTE) 1 % ophthalmic suspension Place 1 drop into the left eye 4 (four) times daily.    ? pregabalin (LYRICA) 150 MG capsule Take 1 capsule (150 mg total) by mouth 2 (two) times daily. 60 capsule 2  ? ramipril (ALTACE) 2.5 MG capsule Take 1 capsule (2.5 mg total) by mouth daily. 90 capsule 1  ? atorvastatin (LIPITOR) 80 MG tablet Take 1 tablet (80 mg total) by mouth daily. 90 tablet 1  ? cyclobenzaprine (FLEXERIL) 5 MG tablet Take 1 tablet (5 mg total) by mouth at bedtime as needed for muscle spasms.  30 tablet 1  ? montelukast (SINGULAIR) 10 MG tablet Take 1 tablet (10 mg total) by mouth at bedtime. 30 tablet 3  ? TRULICITY 1.5 YQ/0.3KV SOPN ADMINISTER 1.5 MG UNDER THE SKIN 1 TIME A WEEK 2 mL 2  ? estradiol (VIVELLE-DOT) 0.0375 MG/24HR PLACE 1 PATCH ONTO THE SKIN 2 (TWO) TIMES A WEEK. 8 patch 12  ? nortriptyline (PAMELOR) 10 MG capsule TAKE 1 CAPSULE BY MOUTH EVERY NIGHT AT BEDTIME FOR 2 WEEKS THEN INCREASE TO 2 CAPSULES EVERY NIGHT AT BEDTIME (Patient taking differently: Take 10 mg by mouth at bedtime.) 60 capsule 5  ? progesterone (PROMETRIUM) 200 MG capsule TAKE 1 CAPSULE (200 MG TOTAL) BY MOUTH DAILY. 30 capsule 3  ? ?No facility-administered medications prior to visit.  ? ? ?No Known Allergies ? ?   ?Objective:  ?  ?BP 121/75 (BP Location: Right Arm, Patient Position: Sitting, Cuff Size: Normal)   Pulse 76   Resp 18   Ht $R'5\' 2"'DH$  (1.575 m)   Wt 190 lb (86.2 kg)   SpO2 98%   BMI 34.75 kg/m?  ?Wt Readings from Last 3 Encounters:  ?08/16/21 190 lb (86.2 kg)  ?03/31/21 191 lb (86.6 kg)  ?03/15/21 191 lb (86.6 kg)  ? ? ?Physical Exam ?Vitals and nursing note reviewed.  ?Constitutional:   ?   Appearance: She is well-developed.  ?HENT:  ?   Head:  Normocephalic and atraumatic.  ?Cardiovascular:  ?   Rate and Rhythm: Normal rate and regular rhythm.  ?   Heart sounds: Normal heart sounds. No murmur heard. ?  No friction rub. No gallop.  ?Pulmonary:  ?   Effort: Pulm

## 2021-08-17 ENCOUNTER — Other Ambulatory Visit: Payer: Self-pay

## 2021-08-17 LAB — CMP14+EGFR
ALT: 22 IU/L (ref 0–32)
AST: 15 IU/L (ref 0–40)
Albumin/Globulin Ratio: 1.6 (ref 1.2–2.2)
Albumin: 4.7 g/dL (ref 3.8–4.9)
Alkaline Phosphatase: 77 IU/L (ref 44–121)
BUN/Creatinine Ratio: 11 (ref 9–23)
BUN: 10 mg/dL (ref 6–24)
Bilirubin Total: 0.6 mg/dL (ref 0.0–1.2)
CO2: 26 mmol/L (ref 20–29)
Calcium: 10.3 mg/dL — ABNORMAL HIGH (ref 8.7–10.2)
Chloride: 104 mmol/L (ref 96–106)
Creatinine, Ser: 0.93 mg/dL (ref 0.57–1.00)
Globulin, Total: 2.9 g/dL (ref 1.5–4.5)
Glucose: 84 mg/dL (ref 70–99)
Potassium: 4.3 mmol/L (ref 3.5–5.2)
Sodium: 144 mmol/L (ref 134–144)
Total Protein: 7.6 g/dL (ref 6.0–8.5)
eGFR: 72 mL/min/{1.73_m2} (ref >59)

## 2021-08-17 LAB — LIPID PANEL
Chol/HDL Ratio: 4.2 ratio (ref 0.0–4.4)
Cholesterol, Total: 186 mg/dL (ref 100–199)
HDL: 44 mg/dL (ref >39)
LDL Chol Calc (NIH): 124 mg/dL — ABNORMAL HIGH (ref 0–99)
Triglycerides: 100 mg/dL (ref 0–149)
VLDL Cholesterol Cal: 18 mg/dL (ref 5–40)

## 2021-08-17 LAB — CBC
Hematocrit: 37.5 % (ref 34.0–46.6)
Hemoglobin: 12.7 g/dL (ref 11.1–15.9)
MCH: 31.4 pg (ref 26.6–33.0)
MCHC: 33.9 g/dL (ref 31.5–35.7)
MCV: 93 fL (ref 79–97)
Platelets: 331 10*3/uL (ref 150–450)
RBC: 4.04 x10E6/uL (ref 3.77–5.28)
RDW: 12.6 % (ref 11.7–15.4)
WBC: 7.2 10*3/uL (ref 3.4–10.8)

## 2021-08-20 ENCOUNTER — Telehealth: Payer: Self-pay

## 2021-08-20 NOTE — Telephone Encounter (Signed)
PA for [employer] 2 products approved until 02/16/22 ?

## 2021-09-27 ENCOUNTER — Ambulatory Visit: Payer: Self-pay

## 2021-09-27 ENCOUNTER — Other Ambulatory Visit: Payer: Self-pay | Admitting: Nurse Practitioner

## 2021-09-27 MED ORDER — TRULICITY 0.75 MG/0.5ML ~~LOC~~ SOAJ: 0.7500 mg | SUBCUTANEOUS | 2 refills | Status: DC

## 2021-09-27 NOTE — Telephone Encounter (Signed)
Trulicity can change to lower dose d/t ?nausea and vomiting, diarrhea sx start the next nausea and diarrhea sx began Pepto Bismol 1.5 mg weekly.  ?Reason for Disposition ? [1] Caller has URGENT medicine question about med that PCP or specialist prescribed AND [2] triager unable to answer question ? ?Answer Assessment - Initial Assessment Questions ?1. NAME of MEDICATION: "What medicine are you calling about?" ?    Trulicity ?2. QUESTION: "What is your question?" (e.g., double dose of medicine, side effect) ?    Can the dose get decreased due to side effects ?3. PRESCRIBING HCP: "Who prescribed it?" Reason: if prescribed by specialist, call should be referred to that group. ?   [name] NP ?4. SYMPTOMS: "Do you have any symptoms?" ?    Next day after taking the trulicity nause and vomitng and diarrhea. ?5. SEVERITY: If symptoms are present, ask "Are they mild, moderate or severe?" ?    mild ?6. PREGNANCY:  "Is there any chance that you are pregnant?" "When was your last menstrual period?" ?    *No Answer* ? ?Protocols used: Medication Question Call-A-AH ? ?

## 2021-09-27 NOTE — Telephone Encounter (Signed)
New trulicity prescription has been sent.  ?

## 2021-09-28 NOTE — Telephone Encounter (Signed)
Left message on voicemail that new Rx was at pharmacy.  ?

## 2021-11-17 ENCOUNTER — Ambulatory Visit: Payer: Medicaid Other | Admitting: Nurse Practitioner

## 2021-11-26 ENCOUNTER — Ambulatory Visit: Payer: Medicaid Other | Admitting: Nurse Practitioner

## 2021-11-29 ENCOUNTER — Ambulatory Visit: Payer: Medicaid Other | Attending: Nurse Practitioner | Admitting: Nurse Practitioner

## 2021-11-29 ENCOUNTER — Encounter: Payer: Self-pay | Admitting: Nurse Practitioner

## 2021-11-29 VITALS — BP 129/82 | HR 75 | Temp 98.2°F | Resp 16 | Wt 195.2 lb

## 2021-11-29 DIAGNOSIS — E1165 Type 2 diabetes mellitus with hyperglycemia: Secondary | ICD-10-CM | POA: Diagnosis not present

## 2021-11-29 DIAGNOSIS — K219 Gastro-esophageal reflux disease without esophagitis: Secondary | ICD-10-CM

## 2021-11-29 DIAGNOSIS — Z1231 Encounter for screening mammogram for malignant neoplasm of breast: Secondary | ICD-10-CM | POA: Diagnosis not present

## 2021-11-29 DIAGNOSIS — Z114 Encounter for screening for human immunodeficiency virus [HIV]: Secondary | ICD-10-CM | POA: Diagnosis not present

## 2021-11-29 DIAGNOSIS — E785 Hyperlipidemia, unspecified: Secondary | ICD-10-CM

## 2021-11-29 MED ORDER — OMEPRAZOLE 40 MG PO CPDR
40.0000 mg | DELAYED_RELEASE_CAPSULE | Freq: Every day | ORAL | 3 refills | Status: AC
Start: 2021-11-29 — End: ?

## 2021-11-29 MED ORDER — ATORVASTATIN CALCIUM 80 MG PO TABS: 80.0000 mg | ORAL_TABLET | Freq: Every day | ORAL | 1 refills | Status: DC

## 2021-11-29 MED ORDER — TRULICITY 0.75 MG/0.5ML ~~LOC~~ SOAJ: 0.7500 mg | SUBCUTANEOUS | 1 refills | Status: DC

## 2021-11-29 MED ORDER — FREESTYLE LIBRE 2 SENSOR MISC: 3 refills | Status: DC

## 2021-11-29 NOTE — Progress Notes (Signed)
Ann Foster is here for 3 month f/u . Patient has no concerns today

## 2021-11-29 NOTE — Patient Instructions (Signed)
[facility] Address: [address], [place], [place] [address] Phone: [phone]

## 2021-11-29 NOTE — Progress Notes (Signed)
Assessment & Plan:  [name] was seen today for diabetes.  Diagnoses and all orders for this visit:  Type 2 diabetes mellitus with hyperglycemia, unspecified whether long term insulin use (HCC) -     Hemoglobin A1c -     Continuous Blood Gluc Sensor (FREESTYLE LIBRE 2 SENSOR) MISC; Use as instructed. Check blood glucose level by fingerstick twice per day. E11.65. Please fill as a 90 day supply -     Dulaglutide (TRULICITY) 7.62 GB/1.5VV SOPN; Inject 0.75 mg into the skin once a week. -     atorvastatin (LIPITOR) 80 MG tablet; Take 1 tablet (80 mg total) by mouth daily. -     Ambulatory referral to Ophthalmology  Encounter for screening for HIV -     Cancel: HIV antibody (with reflex)  Dyslipidemia, goal LDL below 70 -     atorvastatin (LIPITOR) 80 MG tablet; Take 1 tablet (80 mg total) by mouth daily. -     Lipid panel  Breast cancer screening by mammogram -     MM 3D SCREEN BREAST BILATERAL; Future  GERD without esophagitis -     omeprazole (PRILOSEC) 40 MG capsule; Take 1 capsule (40 mg total) by mouth daily.    Patient has been counseled on age-appropriate routine health concerns for screening and prevention. These are reviewed and up-to-date. Referrals have been placed accordingly. Immunizations are up-to-date or declined.    Subjective:   Chief Complaint  Patient presents with   Diabetes   HPI Ann Foster 56 y.o. female presents to office today for follow up to DM.  She has a past medical history of Ankle fracture (2016), Aortic atherosclerosis, Bronchitis, DM2, Hypertension, OA of shoulder, and Osteoarthritis, knee.   DM 2 Well controlled with Trulicity 6.16 mg weekly. She is on renal dose ACE. She is not taking metformin and could not tolerate the increased dose of Trulicity 0.7PX weekly due to GI upset. Has started going to the gym and cut back on carbs.  Lab Results  Component Value Date   HGBA1C 6.0 (A) 08/16/2021   LDL not at goal with high intensity  statin.  Lab Results  Component Value Date   LDLCALC 124 (H) 08/16/2021     Review of Systems  Constitutional:  Negative for fever, malaise/fatigue and weight loss.  HENT: Negative.  Negative for nosebleeds.   Eyes: Negative.  Negative for blurred vision, double vision and photophobia.  Respiratory: Negative.  Negative for cough and shortness of breath.   Cardiovascular: Negative.  Negative for chest pain, palpitations and leg swelling.  Gastrointestinal: Negative.  Negative for heartburn, nausea and vomiting.  Musculoskeletal: Negative.  Negative for myalgias.  Neurological:  Positive for sensory change (notes pain and tingling in right foot and leg). Negative for dizziness, focal weakness, seizures and headaches.  Psychiatric/Behavioral: Negative.  Negative for suicidal ideas.     Past Medical History:  Diagnosis Date   Ankle fracture 2016   Right   Aortic atherosclerosis (HCC)    trace calcific atherosclerosis aortic arch per ct neck done 12-22-17   Bronchitis    Diabetes mellitus without complication (HCC)    Hypertension    OA (osteoarthritis) of shoulder    left shoulder, both knees arthritis   Osteoarthritis, knee     Past Surgical History:  Procedure Laterality Date   COLONOSCOPY     NO PAST SURGERIES      Family History  Problem Relation Age of Onset   Breast cancer Mother  Colon cancer Father 10   Diabetes Son    Esophageal cancer Neg Hx    Stomach cancer Neg Hx    Liver disease Neg Hx    Pancreatic cancer Neg Hx    Rectal cancer Neg Hx     Social History Reviewed with no changes to be made today.   Outpatient Medications Prior to Visit  Medication Sig Dispense Refill   Accu-Chek Softclix Lancets lancets Check blood glucose level by fingerstick once per day. E11.65 100 each 2   Blood Glucose Monitoring Suppl (ACCU-CHEK GUIDE) w/Device KIT Check blood glucose level by fingerstick once per day. E11.65 1 kit 0   Continuous Blood Gluc Receiver (FREESTYLE  LIBRE 2 READER) DEVI Use as instructed. Check blood glucose level by fingerstick twice per day. E11.65 1 each 0   cyclobenzaprine (FLEXERIL) 5 MG tablet Take 1 tablet (5 mg total) by mouth at bedtime as needed for muscle spasms. 30 tablet 1   cyclopentolate (CYCLODRYL,CYCLOGYL) 1 % ophthalmic solution SMARTSIG:1 Drop(s) Left Eye PRN     estradiol (VIVELLE-DOT) 0.0375 MG/24HR PLACE 1 PATCH ONTO THE SKIN 2 (TWO) TIMES A WEEK. 8 patch 12   fluticasone (FLONASE) 50 MCG/ACT nasal spray Place 1 spray into both nostrils 2 (two) times daily.     glucose blood (ACCU-CHEK GUIDE) test strip Check blood glucose level by fingerstick once per day. E11.65 100 each 2   montelukast (SINGULAIR) 10 MG tablet Take 1 tablet (10 mg total) by mouth at bedtime. 30 tablet 3   nortriptyline (PAMELOR) 10 MG capsule TAKE 1 CAPSULE BY MOUTH EVERY NIGHT AT BEDTIME FOR 2 WEEKS THEN INCREASE TO 2 CAPSULES EVERY NIGHT AT BEDTIME (Patient taking differently: Take 10 mg by mouth at bedtime.) 60 capsule 5   Prasterone (INTRAROSA) 6.5 MG INST Insert one capsule vaginally 28 each 3   PRED MILD 0.12 % ophthalmic suspension Place 1 drop into the left eye 4 (four) times daily.     prednisoLONE acetate (PRED FORTE) 1 % ophthalmic suspension Place 1 drop into the left eye 4 (four) times daily.     progesterone (PROMETRIUM) 200 MG capsule TAKE 1 CAPSULE (200 MG TOTAL) BY MOUTH DAILY. 30 capsule 3   ramipril (ALTACE) 2.5 MG capsule Take 1 capsule (2.5 mg total) by mouth daily. 90 capsule 1   atorvastatin (LIPITOR) 80 MG tablet Take 1 tablet (80 mg total) by mouth daily. 90 tablet 1   budesonide (PULMICORT) 0.25 MG/2ML nebulizer solution SMARTSIG:2 Milliliter(s) Via Nebulizer Daily     Continuous Blood Gluc Sensor (FREESTYLE LIBRE 2 SENSOR) MISC Use as instructed. Check blood glucose level by fingerstick twice per day. E11.65 1 each 6   Dulaglutide (TRULICITY) 5.09 TO/6.7TI SOPN Inject 0.75 mg into the skin once a week. 2 mL 2   Insulin Pen  Needle (B-D UF III MINI PEN NEEDLES) 31G X 5 MM MISC Use as instructed. Inject into the skin once a week 100 each 1   metFORMIN (GLUCOPHAGE) 500 MG tablet Take 1 tablet (500 mg) by mouth at bedtime every day. 90 tablet 1   omeprazole (PRILOSEC) 20 MG capsule TAKE 1 CAPSULE(20 MG) BY MOUTH DAILY 30 capsule 3   pregabalin (LYRICA) 150 MG capsule Take 1 capsule (150 mg total) by mouth 2 (two) times daily. 60 capsule 2   No facility-administered medications prior to visit.    No Known Allergies     Objective:    BP 129/82   Pulse 75   Temp 98.2 F (  36.8 C) (Oral)   Resp 16   Wt 195 lb 3.2 oz (88.5 kg)   BMI 35.70 kg/m  Wt Readings from Last 3 Encounters:  11/29/21 195 lb 3.2 oz (88.5 kg)  08/16/21 190 lb (86.2 kg)  03/31/21 191 lb (86.6 kg)    Physical Exam Vitals and nursing note reviewed.  Constitutional:      Appearance: She is well-developed.  HENT:     Head: Normocephalic and atraumatic.  Cardiovascular:     Rate and Rhythm: Normal rate and regular rhythm.     Heart sounds: Normal heart sounds. No murmur heard.    No friction rub. No gallop.  Pulmonary:     Effort: Pulmonary effort is normal. No tachypnea or respiratory distress.     Breath sounds: Normal breath sounds. No decreased breath sounds, wheezing, rhonchi or rales.  Chest:     Chest wall: No tenderness.  Abdominal:     General: Bowel sounds are normal.     Palpations: Abdomen is soft.  Musculoskeletal:        General: Normal range of motion.     Cervical back: Normal range of motion.  Skin:    General: Skin is warm and dry.  Neurological:     Mental Status: She is alert and oriented to person, place, and time.     Coordination: Coordination normal.  Psychiatric:        Behavior: Behavior normal. Behavior is cooperative.        Thought Content: Thought content normal.        Judgment: Judgment normal.          Patient has been counseled extensively about nutrition and exercise as well as the  importance of adherence with medications and regular follow-up. The patient was given clear instructions to go to ER or return to medical center if symptoms don't improve, worsen or new problems develop. The patient verbalized understanding.   Follow-up: Return in 14 weeks (on 03/07/2022).   Gildardo Pounds, FNP-BC [facility] and [facility], [place]   11/29/2021, 10:26 PM

## 2021-11-30 LAB — HEMOGLOBIN A1C
Est. average glucose Bld gHb Est-mCnc: 143 mg/dL
Hgb A1c MFr Bld: 6.6 % — ABNORMAL HIGH (ref 4.8–5.6)

## 2021-11-30 LAB — LIPID PANEL
Chol/HDL Ratio: 3.7 ratio (ref 0.0–4.4)
Cholesterol, Total: 154 mg/dL (ref 100–199)
HDL: 42 mg/dL (ref >39)
LDL Chol Calc (NIH): 93 mg/dL (ref 0–99)
Triglycerides: 105 mg/dL (ref 0–149)
VLDL Cholesterol Cal: 19 mg/dL (ref 5–40)

## 2021-12-08 ENCOUNTER — Ambulatory Visit
Admission: RE | Admit: 2021-12-08 | Discharge: 2021-12-08 | Disposition: A | Discharge status: Home / Self Care | Payer: Medicaid Other | Source: Ambulatory Visit | Attending: Nurse Practitioner | Admitting: Nurse Practitioner

## 2021-12-08 DIAGNOSIS — Z1231 Encounter for screening mammogram for malignant neoplasm of breast: Secondary | ICD-10-CM

## 2021-12-14 ENCOUNTER — Other Ambulatory Visit: Payer: Self-pay | Admitting: Nurse Practitioner

## 2021-12-14 DIAGNOSIS — E1165 Type 2 diabetes mellitus with hyperglycemia: Secondary | ICD-10-CM

## 2021-12-15 NOTE — Telephone Encounter (Signed)
called pharmacy, refills remain. last RF 11/29/21 6 each with 3 RF  Requested Prescriptions  Refused Prescriptions Disp Refills  . Continuous Blood Gluc Sensor (FREESTYLE LIBRE 2 SENSOR) MISC [Pharmacy Med Name: FREESTYLE LIBRE 2 SENSOR] 1 each     Sig: USE AS DIRECTED. CHECK BLOOD GLUCOSE LEVEL BY FINGERSTICK TWICE DAILY     Endocrinology: Diabetes - Testing Supplies Passed - 12/14/2021  8:02 AM      Passed - Valid encounter within last 12 months    Recent Outpatient Visits          2 weeks ago Type 2 diabetes mellitus with hyperglycemia, unspecified whether long term insulin use ([place])   [place], [place] W, NP   4 months ago Type 2 diabetes mellitus with hyperglycemia, unspecified whether long term insulin use [facility])   [place], [place] W, NP   6 months ago Type 2 diabetes mellitus with hyperglycemia, unspecified whether long term insulin use [facility])   [place], [place] W, NP   7 months ago Chronic cough   [place], Annie [place] L, RPH-CPP   8 months ago Type 2 diabetes mellitus with hyperglycemia, unspecified whether long term insulin use [facility])   [place], RPH-CPP

## 2021-12-16 ENCOUNTER — Telehealth: Payer: Self-pay | Admitting: Nurse Practitioner

## 2021-12-16 DIAGNOSIS — E1165 Type 2 diabetes mellitus with hyperglycemia: Secondary | ICD-10-CM

## 2021-12-16 NOTE — Telephone Encounter (Signed)
[facility] called and spoke to [place], [place] about the refill(s) Freestyle Libre 2 Reader sent on 08/16/21 and Jamal Maes 2 Sensor sent on 11/29/21 #6/3 refills. She says they have them and her insurance will only cover 1 sensor at a time that lasts 14 days, so they have plenty of refills on file from that prescription. She says she will refill for the patient.

## 2021-12-16 NOTE — Telephone Encounter (Signed)
Copied from [place] [phone]. Topic: General - Other >> Dec 16, 2021  2:12 PM Everette C wrote: Reason for CRM: Medication Refill - Medication: Continuous Blood Gluc Receiver (FREESTYLE LIBRE 2 READER) DEVI [[id number]]   Continuous Blood Gluc Sensor (FREESTYLE LIBRE 2 SENSOR) MISC [[id number]]   Has the patient contacted their pharmacy? Yes.  The patient has been directed to contact their PCP (Agent: If no, request that the patient contact the pharmacy for the refill. If patient does not wish to contact the pharmacy document the reason why and proceed with request.) (Agent: If yes, when and what did the pharmacy advise?)  Preferred Pharmacy (with phone number or street name): [id number] - [place], [place] - [address] AT [address] [address] Phone: [phone] Fax: [phone] Hours: Open 24 hours   Has the patient been seen for an appointment in the last year OR does the patient have an upcoming appointment? Yes.    Agent: Please be advised that RX refills may take up to 3 business days. We ask that you follow-up with your pharmacy.

## 2021-12-17 ENCOUNTER — Other Ambulatory Visit: Payer: Self-pay | Admitting: Nurse Practitioner

## 2021-12-17 DIAGNOSIS — E1165 Type 2 diabetes mellitus with hyperglycemia: Secondary | ICD-10-CM

## 2022-02-04 ENCOUNTER — Other Ambulatory Visit: Payer: Self-pay

## 2022-02-24 ENCOUNTER — Other Ambulatory Visit: Payer: Self-pay | Admitting: Nurse Practitioner

## 2022-02-24 DIAGNOSIS — E1165 Type 2 diabetes mellitus with hyperglycemia: Secondary | ICD-10-CM

## 2022-03-02 ENCOUNTER — Encounter: Payer: Self-pay | Admitting: Nurse Practitioner

## 2022-03-02 ENCOUNTER — Ambulatory Visit: Payer: Medicaid Other | Attending: Nurse Practitioner | Admitting: Nurse Practitioner

## 2022-03-02 VITALS — BP 125/81 | HR 82 | Temp 97.8°F | Ht 62.0 in | Wt 197.4 lb

## 2022-03-02 DIAGNOSIS — M65331 Trigger finger, right middle finger: Secondary | ICD-10-CM

## 2022-03-02 DIAGNOSIS — M25551 Pain in right hip: Secondary | ICD-10-CM

## 2022-03-02 DIAGNOSIS — E1165 Type 2 diabetes mellitus with hyperglycemia: Secondary | ICD-10-CM

## 2022-03-02 DIAGNOSIS — J302 Other seasonal allergic rhinitis: Secondary | ICD-10-CM | POA: Diagnosis not present

## 2022-03-02 DIAGNOSIS — G8929 Other chronic pain: Secondary | ICD-10-CM

## 2022-03-02 LAB — POCT GLYCOSYLATED HEMOGLOBIN (HGB A1C): HbA1c, POC (controlled diabetic range): 6.5 % (ref 0.0–7.0)

## 2022-03-02 LAB — GLUCOSE, POCT (MANUAL RESULT ENTRY): POC Glucose: 117 mg/dl — AB (ref 70–99)

## 2022-03-02 MED ORDER — NORTRIPTYLINE HCL 10 MG PO CAPS: ORAL_CAPSULE | ORAL | 5 refills | Status: DC

## 2022-03-02 MED ORDER — FLUTICASONE PROPIONATE 50 MCG/ACT NA SUSP: 1.0000 | Freq: Two times a day (BID) | NASAL | 1 refills | Status: DC

## 2022-03-02 MED ORDER — CYCLOBENZAPRINE HCL 5 MG PO TABS: 5.0000 mg | ORAL_TABLET | Freq: Every evening | ORAL | 1 refills | Status: DC | PRN

## 2022-03-02 NOTE — Progress Notes (Signed)
Assessment & Plan:  Ann Foster was seen today for diabetes.  Diagnoses and all orders for this visit:  Type 2 diabetes mellitus with hyperglycemia, unspecified whether long term insulin use  Well-controlled with GLP-1 -     Microalbumin / creatinine urine ratio -     CMP14+EGFR -     POCT glycosylated hemoglobin (Hb A1C) -     POCT glucose (manual entry) Continue blood sugar control as discussed in office today, low carbohydrate diet, and regular physical exercise as tolerated, 150 minutes per week (30 min each day, 5 days per week, or 50 min 3 days per week). Keep blood sugar logs with fasting goal of 90-130 mg/dl, post prandial (after you eat) less than 180.  For Hypoglycemia: BS <60 and Hyperglycemia BS >400; contact the clinic ASAP. Annual eye exams and foot exams are recommended.   Trigger middle finger of right hand -     Ambulatory referral to Hand Surgery  Chronic right hip pain -     nortriptyline (PAMELOR) 10 MG capsule; TAKE 1 CAPSULE BY MOUTH EVERY NIGHT AT BEDTIME FOR 2 WEEKS THEN INCREASE TO 2 CAPSULES EVERY NIGHT AT BEDTIME -     cyclobenzaprine (FLEXERIL) 5 MG tablet; Take 1 tablet (5 mg total) by mouth at bedtime as needed for muscle spasms. Work on losing weight to help reduce joint pain. May alternate with heat and ice application for pain relief. May also alternate with acetaminophen and Ibuprofen as prescribed pain relief. Other alternatives include massage, acupuncture and water aerobics.   Seasonal allergies -     fluticasone (FLONASE) 50 MCG/ACT nasal spray; Place 1 spray into both nostrils 2 (two) times daily.    Patient has been counseled on age-appropriate routine health concerns for screening and prevention. These are reviewed and up-to-date. Referrals have been placed accordingly. Immunizations are up-to-date or declined.    Subjective:   Chief Complaint  Patient presents with   Diabetes   HPI Ann Foster 56 y.o. female presents to office today  for follow up to DM 2.  She is being followed by the retina specialist for bilateral retinal dystrophy, cystoid macular edema of right eye, secondary iritis of left eye   DM 2 Currently well controlled with Trulicity 0.75 mg weekly.  Unfortunately her insurance would not cover the freestyle libre due to the frequency of blood glucose monitoring not being met.  She also does not have a history of peripheral neuropathy. Lab Results  Component Value Date   HGBA1C 6.5 03/02/2022    Notes right middle trigger finger mostly occurring at night and she has to manually adjust her middle finger during the day to release it from the locked position.     Review of Systems  Constitutional:  Negative for fever, malaise/fatigue and weight loss.  HENT:  Positive for congestion. Negative for nosebleeds.   Eyes:  Positive for blurred vision. Negative for double vision and photophobia.  Respiratory: Negative.  Negative for cough and shortness of breath.   Cardiovascular: Negative.  Negative for chest pain, palpitations and leg swelling.  Gastrointestinal: Negative.  Negative for heartburn, nausea and vomiting.  Musculoskeletal:  Positive for joint pain. Negative for myalgias.       See HPI  Neurological: Negative.  Negative for dizziness, focal weakness, seizures and headaches.  Endo/Heme/Allergies:  Positive for environmental allergies.  Psychiatric/Behavioral: Negative.  Negative for suicidal ideas.     Past Medical History:  Diagnosis Date   Ankle fracture 2016  Right   Aortic atherosclerosis (HCC)    trace calcific atherosclerosis aortic arch per ct neck done 12-22-17   Bronchitis    Diabetes mellitus without complication (HCC)    Hypertension    OA (osteoarthritis) of shoulder    left shoulder, both knees arthritis   Osteoarthritis, knee     Past Surgical History:  Procedure Laterality Date   COLONOSCOPY     NO PAST SURGERIES      Family History  Problem Relation Age of Onset    Breast cancer Mother    Colon cancer Father 92   Diabetes Son    Esophageal cancer Neg Hx    Stomach cancer Neg Hx    Liver disease Neg Hx    Pancreatic cancer Neg Hx    Rectal cancer Neg Hx     Social History Reviewed with no changes to be made today.   Outpatient Medications Prior to Visit  Medication Sig Dispense Refill   Accu-Chek Softclix Lancets lancets Check blood glucose level by fingerstick once per day. E11.65 100 each 2   atorvastatin (LIPITOR) 80 MG tablet Take 1 tablet (80 mg total) by mouth daily. 90 tablet 1   Blood Glucose Monitoring Suppl (ACCU-CHEK GUIDE) w/Device KIT Check blood glucose level by fingerstick once per day. E11.65 1 kit 0   cyclopentolate (CYCLODRYL,CYCLOGYL) 1 % ophthalmic solution SMARTSIG:1 Drop(s) Left Eye PRN     Dulaglutide (TRULICITY) 0.03 BC/4.8GQ SOPN Inject 0.75 mg into the skin once a week. 6 mL 1   glucose blood (ACCU-CHEK GUIDE) test strip Check blood glucose level by fingerstick once per day. E11.65 100 each 2   montelukast (SINGULAIR) 10 MG tablet Take 1 tablet (10 mg total) by mouth at bedtime. 30 tablet 3   omeprazole (PRILOSEC) 40 MG capsule Take 1 capsule (40 mg total) by mouth daily. 90 capsule 3   Prasterone (INTRAROSA) 6.5 MG INST Insert one capsule vaginally 28 each 3   PRED MILD 0.12 % ophthalmic suspension Place 1 drop into the left eye 4 (four) times daily.     prednisoLONE acetate (PRED FORTE) 1 % ophthalmic suspension Place 1 drop into the left eye 4 (four) times daily.     ramipril (ALTACE) 2.5 MG capsule TAKE 1 CAPSULE(2.5 MG) BY MOUTH DAILY 90 capsule 1   cyclobenzaprine (FLEXERIL) 5 MG tablet Take 1 tablet (5 mg total) by mouth at bedtime as needed for muscle spasms. 30 tablet 1   fluticasone (FLONASE) 50 MCG/ACT nasal spray Place 1 spray into both nostrils 2 (two) times daily.     Continuous Blood Gluc Receiver (FREESTYLE LIBRE 2 READER) DEVI Use as instructed. Check blood glucose level by fingerstick twice per day. E11.65  (Patient not taking: Reported on 03/02/2022) 1 each 0   Continuous Blood Gluc Sensor (FREESTYLE LIBRE 2 SENSOR) MISC Use as instructed. Check blood glucose level by fingerstick twice per day. E11.65. Please fill as a 90 day supply (Patient not taking: Reported on 03/02/2022) 6 each 3   estradiol (VIVELLE-DOT) 0.0375 MG/24HR PLACE 1 PATCH ONTO THE SKIN 2 (TWO) TIMES A WEEK. 8 patch 12   progesterone (PROMETRIUM) 200 MG capsule TAKE 1 CAPSULE (200 MG TOTAL) BY MOUTH DAILY. 30 capsule 3   nortriptyline (PAMELOR) 10 MG capsule TAKE 1 CAPSULE BY MOUTH EVERY NIGHT AT BEDTIME FOR 2 WEEKS THEN INCREASE TO 2 CAPSULES EVERY NIGHT AT BEDTIME (Patient taking differently: Take 10 mg by mouth at bedtime.) 60 capsule 5   No facility-administered medications prior  to visit.    No Known Allergies     Objective:    BP 125/81   Pulse 82   Temp 97.8 F (36.6 C) (Oral)   Ht $R'5\' 2"'DG$  (1.575 m)   Wt 197 lb 6.4 oz (89.5 kg)   SpO2 95%   BMI 36.10 kg/m  Wt Readings from Last 3 Encounters:  03/02/22 197 lb 6.4 oz (89.5 kg)  11/29/21 195 lb 3.2 oz (88.5 kg)  08/16/21 190 lb (86.2 kg)    Physical Exam Vitals and nursing note reviewed.  Constitutional:      Appearance: She is well-developed.  HENT:     Head: Normocephalic and atraumatic.  Cardiovascular:     Rate and Rhythm: Normal rate and regular rhythm.     Heart sounds: Normal heart sounds. No murmur heard.    No friction rub. No gallop.  Pulmonary:     Effort: Pulmonary effort is normal. No tachypnea or respiratory distress.     Breath sounds: Normal breath sounds. No decreased breath sounds, wheezing, rhonchi or rales.  Chest:     Chest wall: No tenderness.  Abdominal:     General: Bowel sounds are normal.     Palpations: Abdomen is soft.  Musculoskeletal:        General: Normal range of motion.     Right hand: Deformity present.     Cervical back: Normal range of motion.  Skin:    General: Skin is warm and dry.  Neurological:     Mental  Status: She is alert and oriented to person, place, and time.     Coordination: Coordination normal.  Psychiatric:        Behavior: Behavior normal. Behavior is cooperative.        Thought Content: Thought content normal.        Judgment: Judgment normal.          Patient has been counseled extensively about nutrition and exercise as well as the importance of adherence with medications and regular follow-up. The patient was given clear instructions to go to ER or return to medical center if symptoms don't improve, worsen or new problems develop. The patient verbalized understanding.   Follow-up: Return in about 3 months (around 06/02/2022).   Gildardo Pounds, FNP-BC [facility] and [facility], [place]   03/02/2022, 1:34 PM

## 2022-03-03 LAB — CMP14+EGFR
ALT: 15 IU/L (ref 0–32)
AST: 12 IU/L (ref 0–40)
Albumin/Globulin Ratio: 1.5 (ref 1.2–2.2)
Albumin: 4.7 g/dL (ref 3.8–4.9)
Alkaline Phosphatase: 74 IU/L (ref 44–121)
BUN/Creatinine Ratio: 12 (ref 9–23)
BUN: 11 mg/dL (ref 6–24)
Bilirubin Total: 0.4 mg/dL (ref 0.0–1.2)
CO2: 23 mmol/L (ref 20–29)
Calcium: 10.2 mg/dL (ref 8.7–10.2)
Chloride: 103 mmol/L (ref 96–106)
Creatinine, Ser: 0.89 mg/dL (ref 0.57–1.00)
Globulin, Total: 3.2 g/dL (ref 1.5–4.5)
Glucose: 96 mg/dL (ref 70–99)
Potassium: 4.7 mmol/L (ref 3.5–5.2)
Sodium: 141 mmol/L (ref 134–144)
Total Protein: 7.9 g/dL (ref 6.0–8.5)
eGFR: 75 mL/min/{1.73_m2} (ref >59)

## 2022-03-03 LAB — MICROALBUMIN / CREATININE URINE RATIO
Creatinine, Urine: 233.2 mg/dL
Microalb/Creat Ratio: 26 mg/g creat (ref 0–29)
Microalbumin, Urine: 60.8 ug/mL

## 2022-03-07 ENCOUNTER — Other Ambulatory Visit: Payer: Self-pay

## 2022-04-04 DIAGNOSIS — M65331 Trigger finger, right middle finger: Secondary | ICD-10-CM | POA: Diagnosis not present

## 2022-04-25 DIAGNOSIS — H3589 Other specified retinal disorders: Secondary | ICD-10-CM | POA: Diagnosis not present

## 2022-04-25 DIAGNOSIS — H353221 Exudative age-related macular degeneration, left eye, with active choroidal neovascularization: Secondary | ICD-10-CM | POA: Diagnosis not present

## 2022-04-25 DIAGNOSIS — H35041 Retinal micro-aneurysms, unspecified, right eye: Secondary | ICD-10-CM | POA: Diagnosis not present

## 2022-04-25 DIAGNOSIS — E113293 Type 2 diabetes mellitus with mild nonproliferative diabetic retinopathy without macular edema, bilateral: Secondary | ICD-10-CM | POA: Diagnosis not present

## 2022-04-25 DIAGNOSIS — Z7984 Long term (current) use of oral hypoglycemic drugs: Secondary | ICD-10-CM | POA: Diagnosis not present

## 2022-04-25 DIAGNOSIS — Z794 Long term (current) use of insulin: Secondary | ICD-10-CM | POA: Insufficient documentation

## 2022-04-25 DIAGNOSIS — H35361 Drusen (degenerative) of macula, right eye: Secondary | ICD-10-CM | POA: Diagnosis not present

## 2022-04-25 DIAGNOSIS — Z961 Presence of intraocular lens: Secondary | ICD-10-CM | POA: Diagnosis not present

## 2022-04-25 DIAGNOSIS — H31103 Choroidal degeneration, unspecified, bilateral: Secondary | ICD-10-CM | POA: Diagnosis not present

## 2022-04-25 DIAGNOSIS — H53001 Unspecified amblyopia, right eye: Secondary | ICD-10-CM | POA: Diagnosis not present

## 2022-05-16 ENCOUNTER — Other Ambulatory Visit: Payer: Self-pay | Admitting: Nurse Practitioner

## 2022-05-16 DIAGNOSIS — E1165 Type 2 diabetes mellitus with hyperglycemia: Secondary | ICD-10-CM

## 2022-05-17 DIAGNOSIS — E113293 Type 2 diabetes mellitus with mild nonproliferative diabetic retinopathy without macular edema, bilateral: Secondary | ICD-10-CM | POA: Diagnosis not present

## 2022-05-17 DIAGNOSIS — H353221 Exudative age-related macular degeneration, left eye, with active choroidal neovascularization: Secondary | ICD-10-CM | POA: Diagnosis not present

## 2022-05-17 DIAGNOSIS — H3589 Other specified retinal disorders: Secondary | ICD-10-CM | POA: Diagnosis not present

## 2022-06-03 ENCOUNTER — Ambulatory Visit: Payer: Medicare Other | Attending: Nurse Practitioner | Admitting: Nurse Practitioner

## 2022-06-03 ENCOUNTER — Encounter: Payer: Self-pay | Admitting: Nurse Practitioner

## 2022-06-03 VITALS — BP 134/74 | HR 76 | Temp 98.5°F | Ht 62.0 in | Wt 202.0 lb

## 2022-06-03 DIAGNOSIS — Z794 Long term (current) use of insulin: Secondary | ICD-10-CM

## 2022-06-03 DIAGNOSIS — J069 Acute upper respiratory infection, unspecified: Secondary | ICD-10-CM

## 2022-06-03 DIAGNOSIS — Z1211 Encounter for screening for malignant neoplasm of colon: Secondary | ICD-10-CM

## 2022-06-03 DIAGNOSIS — E113212 Type 2 diabetes mellitus with mild nonproliferative diabetic retinopathy with macular edema, left eye: Secondary | ICD-10-CM

## 2022-06-03 MED ORDER — PREDNISONE 20 MG PO TABS: 20.0000 mg | ORAL_TABLET | Freq: Every day | ORAL | 0 refills | Status: AC

## 2022-06-03 MED ORDER — PROMETHAZINE-DM 6.25-15 MG/5ML PO SYRP: 5.0000 mL | ORAL_SOLUTION | Freq: Four times a day (QID) | ORAL | 0 refills | Status: DC | PRN

## 2022-06-03 NOTE — Progress Notes (Signed)
Patient has chronic cough and runny nose.

## 2022-06-03 NOTE — Progress Notes (Signed)
Assessment & Plan:  Ann Foster was seen today for diabetes.  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 -     predniSONE (DELTASONE) 20 MG tablet; Take 1 tablet (20 mg total) by mouth daily with breakfast for 5 days.    Patient has been counseled on age-appropriate routine health concerns for screening and prevention. These are reviewed and up-to-date. Referrals have been placed accordingly. Immunizations are up-to-date or declined.    Subjective:   Chief Complaint  Patient presents with   Diabetes   HPI Ann Foster 57 y.o. female presents to office today for for follow-up to DM 2 and with symptoms of URI.   Endorses productive cough. Started a few days ago. Was tested for COVID, RSV and flu. Now with runny nose. Last cigarette she smoked was before christmas.   Cough: Patient complains of nasal congestion, productive cough with sputum described as mucoid, and sneezing.  Symptoms began several days ago.  The cough is without wheezing, dyspnea or hemoptysis, productive of clear sputum, with shortness of breath during the cough, chest is painful during coughing, harsh and is aggravated by nothing Associated symptoms include: none . Patient does not have new pets. Patient does not have a history of asthma. Patient does not have a history of environmental allergens. Patient did not have recent travel. Patient does have a history of smoking but has not smoked since December 2023. Patient  does not have recent Chest X-ray.   DM2 Well-controlled. We will recheck A1c today.  She is on Trulicity 8.31  mg weekly, high intensity statin and renal dose ACE. Lab Results  Component Value Date   HGBA1C 6.5 03/02/2022    Blood pressure is well-controlled today BP Readings from Last 3 Encounters:  06/03/22 134/74  03/02/22 125/81  11/29/21 129/82    Review of Systems  Constitutional:  Negative for fever, malaise/fatigue and weight loss.  HENT:  Positive for congestion and sore throat. Negative for nosebleeds.   Eyes: Negative.  Negative for blurred vision, double vision and photophobia.  Respiratory:  Positive for cough, sputum production and shortness of breath. Negative for wheezing.   Cardiovascular: Negative.  Negative for chest pain, palpitations and leg swelling.  Gastrointestinal: Negative.  Negative for heartburn, nausea and vomiting.  Musculoskeletal: Negative.  Negative for myalgias.  Neurological: Negative.  Negative for dizziness, focal weakness, seizures and headaches.  Psychiatric/Behavioral: Negative.  Negative for suicidal ideas.     Past Medical History:  Diagnosis Date   Ankle fracture 2016   Right   Aortic atherosclerosis (HCC)    trace calcific atherosclerosis aortic arch per ct neck done 12-22-17   Bronchitis    Diabetes mellitus without complication (HCC)    Hypertension    OA (osteoarthritis) of shoulder    left shoulder, both knees arthritis   Osteoarthritis, knee     Past Surgical History:  Procedure Laterality Date   COLONOSCOPY     NO PAST SURGERIES      Family History  Problem Relation Age of Onset   Breast cancer Mother    Colon  cancer Father 63   Diabetes Son    Esophageal cancer Neg Hx    Stomach cancer Neg Hx    Liver disease Neg Hx    Pancreatic cancer Neg Hx    Rectal cancer Neg Hx     Social History Reviewed with no changes to be made today.   Outpatient Medications Prior to Visit  Medication Sig Dispense Refill   Accu-Chek Softclix Lancets lancets Check blood glucose level by fingerstick once per day. E11.65 100 each 2    atorvastatin (LIPITOR) 80 MG tablet Take 1 tablet (80 mg total) by mouth daily. 90 tablet 1   Blood Glucose Monitoring Suppl (ACCU-CHEK GUIDE) w/Device KIT Check blood glucose level by fingerstick once per day. E11.65 1 kit 0   cyclobenzaprine (FLEXERIL) 5 MG tablet Take 1 tablet (5 mg total) by mouth at bedtime as needed for muscle spasms. 30 tablet 1   cyclopentolate (CYCLODRYL,CYCLOGYL) 1 % ophthalmic solution SMARTSIG:1 Drop(s) Left Eye PRN     fluticasone (FLONASE) 50 MCG/ACT nasal spray Place 1 spray into both nostrils 2 (two) times daily. 16 g 1   glucose blood (ACCU-CHEK GUIDE) test strip Check blood glucose level by fingerstick once per day. E11.65 100 each 2   montelukast (SINGULAIR) 10 MG tablet Take 1 tablet (10 mg total) by mouth at bedtime. 30 tablet 3   nortriptyline (PAMELOR) 10 MG capsule TAKE 1 CAPSULE BY MOUTH EVERY NIGHT AT BEDTIME FOR 2 WEEKS THEN INCREASE TO 2 CAPSULES EVERY NIGHT AT BEDTIME 60 capsule 5   omeprazole (PRILOSEC) 40 MG capsule Take 1 capsule (40 mg total) by mouth daily. 90 capsule 3   Prasterone (INTRAROSA) 6.5 MG INST Insert one capsule vaginally 28 each 3   PRED MILD 0.12 % ophthalmic suspension Place 1 drop into the left eye 4 (four) times daily.     prednisoLONE acetate (PRED FORTE) 1 % ophthalmic suspension Place 1 drop into the left eye 4 (four) times daily.     ramipril (ALTACE) 2.5 MG capsule TAKE 1 CAPSULE(2.5 MG) BY MOUTH DAILY 90 capsule 1   TRULICITY 2.97 LG/9.2JJ SOPN ADMINISTER 0.75 MG UNDER THE SKIN 1 TIME A WEEK 6 mL 1   estradiol (VIVELLE-DOT) 0.0375 MG/24HR PLACE 1 PATCH ONTO THE SKIN 2 (TWO) TIMES A WEEK. 8 patch 12   progesterone (PROMETRIUM) 200 MG capsule TAKE 1 CAPSULE (200 MG TOTAL) BY MOUTH DAILY. 30 capsule 3   No facility-administered medications prior to visit.    No Known Allergies     Objective:    BP 134/74   Pulse 76   Temp 98.5 F (36.9 C) (Oral)   Ht '5\' 2"'$  (1.575 m)   Wt 202 lb (91.6 kg)   SpO2 95%   BMI 36.95  kg/m  Wt Readings from Last 3 Encounters:  06/03/22 202 lb (91.6 kg)  03/02/22 197 lb 6.4 oz (89.5 kg)  11/29/21 195 lb 3.2 oz (88.5 kg)    Physical Exam Vitals and nursing note reviewed.  Constitutional:      Appearance: She is well-developed.  HENT:     Head: Normocephalic and atraumatic.     Right Ear: Hearing, tympanic membrane, ear canal and external ear normal.     Left Ear: Hearing, tympanic membrane, ear canal and external ear normal.     Nose:     Right Turbinates: Enlarged, swollen and pale.     Left Turbinates: Enlarged, swollen and pale.  Cardiovascular:     Rate and Rhythm: Normal  rate and regular rhythm.     Heart sounds: Normal heart sounds. No murmur heard.    No friction rub. No gallop.  Pulmonary:     Effort: Pulmonary effort is normal. No tachypnea or respiratory distress.     Breath sounds: Wheezing and rhonchi present. No decreased breath sounds or rales.  Chest:     Chest wall: No tenderness.  Abdominal:     General: Bowel sounds are normal.     Palpations: Abdomen is soft.  Musculoskeletal:        General: Normal range of motion.     Cervical back: Normal range of motion.  Skin:    General: Skin is warm and dry.  Neurological:     Mental Status: She is alert and oriented to person, place, and time.     Coordination: Coordination normal.  Psychiatric:        Behavior: Behavior normal. Behavior is cooperative.        Thought Content: Thought content normal.        Judgment: Judgment normal.          Patient has been counseled extensively about nutrition and exercise as well as the importance of adherence with medications and regular follow-up. The patient was given clear instructions to go to ER or return to medical center if symptoms don't improve, worsen or new problems develop. The patient verbalized understanding.   Follow-up: Return in about 4 months (around 10/02/2022) for PHYSICAL.   Gildardo Pounds, FNP-BC [facility]  and [place], [place]   06/03/2022, 2:06 PM

## 2022-06-04 LAB — HEMOGLOBIN A1C
Est. average glucose Bld gHb Est-mCnc: 171 mg/dL
Hgb A1c MFr Bld: 7.6 % — ABNORMAL HIGH (ref 4.8–5.6)

## 2022-06-07 ENCOUNTER — Other Ambulatory Visit: Payer: Self-pay | Admitting: Nurse Practitioner

## 2022-06-07 DIAGNOSIS — E1165 Type 2 diabetes mellitus with hyperglycemia: Secondary | ICD-10-CM

## 2022-06-07 MED ORDER — TRULICITY 0.75 MG/0.5ML ~~LOC~~ SOAJ: 0.7500 mg | SUBCUTANEOUS | 1 refills | Status: DC

## 2022-06-07 MED ORDER — DAPAGLIFLOZIN PROPANEDIOL 5 MG PO TABS: 5.0000 mg | ORAL_TABLET | Freq: Every day | ORAL | 6 refills | Status: DC

## 2022-06-07 NOTE — Progress Notes (Signed)
I

## 2022-07-19 DIAGNOSIS — E113293 Type 2 diabetes mellitus with mild nonproliferative diabetic retinopathy without macular edema, bilateral: Secondary | ICD-10-CM | POA: Diagnosis not present

## 2022-07-19 DIAGNOSIS — Z7984 Long term (current) use of oral hypoglycemic drugs: Secondary | ICD-10-CM | POA: Diagnosis not present

## 2022-07-19 DIAGNOSIS — H31103 Choroidal degeneration, unspecified, bilateral: Secondary | ICD-10-CM | POA: Diagnosis not present

## 2022-07-19 DIAGNOSIS — H353232 Exudative age-related macular degeneration, bilateral, with inactive choroidal neovascularization: Secondary | ICD-10-CM | POA: Diagnosis not present

## 2022-07-19 DIAGNOSIS — H35361 Drusen (degenerative) of macula, right eye: Secondary | ICD-10-CM | POA: Diagnosis not present

## 2022-07-19 DIAGNOSIS — Z961 Presence of intraocular lens: Secondary | ICD-10-CM | POA: Diagnosis not present

## 2022-09-05 ENCOUNTER — Telehealth: Payer: Self-pay | Admitting: Nurse Practitioner

## 2022-09-05 NOTE — Telephone Encounter (Signed)
Contacted Layne Bench to schedule their annual wellness visit. Welcome to Medicare visit Due by 02/28/2023.  Rudell Cobb AWV direct phone # [phone]    WTM before 02/28/23 per [facility]

## 2022-09-22 ENCOUNTER — Ambulatory Visit: Payer: Self-pay

## 2022-09-22 NOTE — Telephone Encounter (Signed)
     Chief Complaint: Both thighs hurting from varicose veins. Symptoms: Above Frequency: 3 weeks ago Pertinent Negatives: Patient denies any other symptoms. Disposition: ED /[] Urgent Care (no appt availability in office) / Appointment(In office/virtual)/  [facility]/ Home Care/ Refused Recommended Disposition /[] [facility]/  Follow-up with PCP Additional Notes: Declines Mobile Unit. Requests to be worked in next week. Please advise pt.  Answer Assessment - Initial Assessment Questions 1. ONSET: "When did the pain start?"      3 weeks 2. LOCATION: "Where is the pain located?"      Both thighs 3. PAIN: "How bad is the pain?"    (Scale 1-10; or mild, moderate, severe)   -  MILD (1-3): doesn't interfere with normal activities    -  MODERATE (4-7): interferes with normal activities (e.g., work or school) or awakens from sleep, limping    -  SEVERE (8-10): excruciating pain, unable to do any normal activities, unable to walk     8 4. WORK OR EXERCISE: "Has there been any recent work or exercise that involved this part of the body?"      No 5. CAUSE: "What do you think is causing the leg pain?"     Varicose veins 6. OTHER SYMPTOMS: "Do you have any other symptoms?" (e.g., chest pain, back pain, breathing difficulty, swelling, rash, fever, numbness, weakness)     Pain 7. PREGNANCY: "Is there any chance you are pregnant?" "When was your last menstrual period?"     No  Protocols used: Leg Pain-A-AH

## 2022-09-22 NOTE — Telephone Encounter (Signed)
She will need an office visit. This is not urgent and does not require being worked in.

## 2022-09-23 NOTE — Telephone Encounter (Signed)
Thank you will left patient know

## 2022-09-23 NOTE — Telephone Encounter (Deleted)
Thank you :)

## 2022-10-09 ENCOUNTER — Other Ambulatory Visit: Payer: Self-pay | Admitting: Family Medicine

## 2022-10-09 DIAGNOSIS — E1165 Type 2 diabetes mellitus with hyperglycemia: Secondary | ICD-10-CM

## 2022-10-10 NOTE — Telephone Encounter (Signed)
Rx 06/07/22 6ml 1RF- too soon Requested Prescriptions  Pending Prescriptions Disp Refills   TRULICITY 0.75 MG/0.5ML SOPN [Pharmacy Med Name: TRULICITY 0.75MG /0.5ML SDP 0.5ML] 6 mL 1    Sig: ADMINISTER 0.75 MG UNDER THE SKIN 1 TIME A WEEK     Endocrinology:  Diabetes - GLP-1 Receptor Agonists Passed - 10/09/2022  1:09 PM      Passed - HBA1C is between 0 and 7.9 and within 180 days    HbA1c, POC (controlled diabetic range)  Date Value Ref Range Status  03/02/2022 6.5 0.0 - 7.0 % Final   Hgb A1c MFr Bld  Date Value Ref Range Status  06/03/2022 7.6 (H) 4.8 - 5.6 % Final    Comment:             Prediabetes: 5.7 - 6.4          Diabetes: >6.4          Glycemic control for adults with diabetes: <7.0          Passed - Valid encounter within last 6 months    Recent Outpatient Visits           4 months ago 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, with long-term current use of insulin [facility])   [place] [facility] [place], [place] W, NP   7 months ago Type 2 diabetes mellitus with hyperglycemia, unspecified whether long term insulin use [facility])   [place] [facility] [place], [place] W, NP   10 months ago Type 2 diabetes mellitus with hyperglycemia, unspecified whether long term insulin use [facility])   [place] [facility] [facility] [place], [place] W, NP   1 year ago Type 2 diabetes mellitus with hyperglycemia, unspecified whether long term insulin use [facility])   [place] [facility] [place], [place] W, NP   1 year ago Type 2 diabetes mellitus with hyperglycemia, unspecified whether long term insulin use [facility])   [place] [facility] [place], Shea Stakes, NP

## 2022-11-02 ENCOUNTER — Ambulatory Visit: Payer: 59 | Attending: Physician Assistant | Admitting: Physician Assistant

## 2022-11-02 ENCOUNTER — Encounter: Payer: Self-pay | Admitting: Physician Assistant

## 2022-11-02 VITALS — BP 129/77 | HR 82 | Ht 62.0 in | Wt 199.2 lb

## 2022-11-02 DIAGNOSIS — E1165 Type 2 diabetes mellitus with hyperglycemia: Secondary | ICD-10-CM | POA: Diagnosis not present

## 2022-11-02 DIAGNOSIS — E785 Hyperlipidemia, unspecified: Secondary | ICD-10-CM

## 2022-11-02 DIAGNOSIS — I10 Essential (primary) hypertension: Secondary | ICD-10-CM | POA: Diagnosis not present

## 2022-11-02 DIAGNOSIS — U099 Post covid-19 condition, unspecified: Secondary | ICD-10-CM

## 2022-11-02 DIAGNOSIS — Z79890 Hormone replacement therapy (postmenopausal): Secondary | ICD-10-CM

## 2022-11-02 DIAGNOSIS — Z7985 Long-term (current) use of injectable non-insulin antidiabetic drugs: Secondary | ICD-10-CM

## 2022-11-02 LAB — POCT GLYCOSYLATED HEMOGLOBIN (HGB A1C): Hemoglobin A1C: 7.8 % — AB (ref 4.0–5.6)

## 2022-11-02 LAB — GLUCOSE, POCT (MANUAL RESULT ENTRY): POC Glucose: 145 mg/dl — AB (ref 70–99)

## 2022-11-02 MED ORDER — ALBUTEROL SULFATE HFA 108 (90 BASE) MCG/ACT IN AERS
2.0000 | INHALATION_SPRAY | Freq: Four times a day (QID) | RESPIRATORY_TRACT | 2 refills | Status: DC | PRN
Start: 2022-11-02 — End: 2023-09-18

## 2022-11-02 MED ORDER — TRULICITY 1.5 MG/0.5ML ~~LOC~~ SOAJ
1.5000 mg | SUBCUTANEOUS | 3 refills | Status: DC
Start: 2022-11-02 — End: 2023-02-01

## 2022-11-02 NOTE — Progress Notes (Signed)
Previous covid dx.

## 2022-11-02 NOTE — Progress Notes (Signed)
Patient ID: Ann Foster, female   DOB: 08/08/1965, 57 y.o.   MRN: [id number]    Ann Foster, is a 57 y.o. female  [id number]  [id number]  DOB - Apr 12, 1966  Chief Complaint  Patient presents with   Hypertension   Diabetes       Subjective:   Ann Foster is a 57 y.o. female here today for a follow up visit s/p covid infection about mid MAy 2024.  She is feeling much better.  Still using inhaler some but has been walking for exercise.  Energy levels back to normal.  Needs to see gyn for HRT and pap and needs referral.  Not checking blood sugars.  Compliant with med.  No CP.    Some wheezing s/p covid, but overall feeling good.    Seen at ED 10/12/2022 and tested positive for Covid: Medical Decision Making Medical Decision Making Patient is a 57 year old female who presented to the ED for evaluation of cough onset today. Patient reports recent COVID exposure. On exam, patient is afebrile, nontoxic-appearing, with stable vital signs. Lungs with just faint wheezing. Heart regular rate and rhythm. Patient has tested positive for COVID-19 which is suspect is likely cause of her symptoms. I have independently reviewed her chest x-ray which shows no concern for pneumonia, pneumothorax, or pulmonary edema. Patient given albuterol inhaler and Tessalon Perles here in the ED. Will discharge patient with prescription for Jerilynn Som and outpatient follow-up with PCP. We discussed quarantine instructions in detail as well as numerous over-the-counter medications to assist with her symptoms. Ambulatory pulse ox 97% on room air here in the ED and I do believe she is stable for discharge home. Patient ready for d/c. Patient discharged home in stable condition. Parent/patient agrees with treatment plan and disposition. All questions answered. Strict ED return precautions dicussed with parent/patient. Reviewed discharge instructions in detail. Parent/patient verbalizes understanding and agrees with  the plan and has no further questions or concerns.  Problems Addressed: Acute cough: complicated acute illness or injury COVID-19: complicated acute illness or injury  Amount and/or Complexity of Data Reviewed Labs: ordered. Radiology: ordered and independent interpretation performed.  Risk Prescription drug management.   Additional Information: I reviewed the patient's past medical history, including notes from our facility and what is available in CareEverywhere.  External records were reviewed: Reviewed recent PCP visit from 06/03/2022 when patient was seen for routine follow-up regarding history of diabetes  Differentials considered: DDX: URI, pneumonia, COVID-19, allergies, influenza, medication, bronchitis  Risk of complications and/or morbidity or mortality of patient management: Patient's health illiteracy increases the complexity of managing their presentation and will complicate their treatment plans. I have addressed this by provided education about medical condition and treatment needed.  Clinical Complexity  Patient's presentation is most consistent with acute presentation with potential threat to life or bodily function.  Plan:  Clinical picture was discussed with patient along with risks and benefits of management options. We will discharge the patient home with close outpatient follow-up for continued management. Based on the above findings, I believe patient is hemodynamically stable for discharge.  The following prescriptions were given for continued management of symptoms or pathologies:tessalon perles  Patient/and family educated about specific return precautions for given chief complaint and symptoms. Patient/and family educated about follow-up with PCP. Patient/and family expressed understanding of return precautions and need for follow-up. Patient discharged. Diagnosis, treatment, plan discussed with patient. All questions were answered to the patient's  satisfaction. If patient was given medication(s), adverse effects,  black box warnings, and drug interactions were discussed with patient. Patient instructed to follow-up here in the emergency department for new or worsening symptoms.  1. COVID-19 2. Acute cough   Medication List   START taking these medications  benzonatate 100 mg capsule Commonly known as: TESSALON Take 1 capsule (100 mg total) by mouth 3 (three) times a day as needed for cough for up to 7 days.   No problems updated.  ALLERGIES: No Known Allergies  PAST MEDICAL HISTORY: Past Medical History:  Diagnosis Date   Ankle fracture 2016   Right   Aortic atherosclerosis (HCC)    trace calcific atherosclerosis aortic arch per ct neck done 12-22-17   Bronchitis    Diabetes mellitus without complication (HCC)    Hypertension    OA (osteoarthritis) of shoulder    left shoulder, both knees arthritis   Osteoarthritis, knee     MEDICATIONS AT HOME: Prior to Admission medications   Medication Sig Start Date End Date Taking? Authorizing Provider  Accu-Chek Softclix Lancets lancets Check blood glucose level by fingerstick once per day. E11.65 05/07/21  Yes Hoy Register, MD  albuterol (VENTOLIN HFA) 108 (90 Base) MCG/ACT inhaler Inhale 2 puffs into the lungs every 6 (six) hours as needed for wheezing or shortness of breath. 11/02/22  Yes Anders Simmonds, PA-C  atorvastatin (LIPITOR) 80 MG tablet Take 1 tablet (80 mg total) by mouth daily. 11/29/21  Yes Claiborne Rigg, NP  Blood Glucose Monitoring Suppl (ACCU-CHEK GUIDE) w/Device KIT Check blood glucose level by fingerstick once per day. E11.65 05/07/21  Yes Hoy Register, MD  cyclobenzaprine (FLEXERIL) 5 MG tablet Take 1 tablet (5 mg total) by mouth at bedtime as needed for muscle spasms. 03/02/22  Yes Claiborne Rigg, NP  dapagliflozin propanediol (FARXIGA) 5 MG TABS tablet Take 1 tablet (5 mg total) by mouth daily before breakfast. 06/07/22  Yes Claiborne Rigg, NP   Dulaglutide (TRULICITY) 1.5 MG/0.5ML SOPN Inject 1.5 mg into the skin once a week. 11/02/22  Yes Raushanah Osmundson M, PA-C  fluticasone (FLONASE) 50 MCG/ACT nasal spray Place 1 spray into both nostrils 2 (two) times daily. 03/02/22  Yes Claiborne Rigg, NP  glucose blood (ACCU-CHEK GUIDE) test strip Check blood glucose level by fingerstick once per day. E11.65 05/07/21  Yes Newlin, Odette Horns, MD  montelukast (SINGULAIR) 10 MG tablet Take 1 tablet (10 mg total) by mouth at bedtime. 08/16/21  Yes Claiborne Rigg, NP  nortriptyline (PAMELOR) 10 MG capsule TAKE 1 CAPSULE BY MOUTH EVERY NIGHT AT BEDTIME FOR 2 WEEKS THEN INCREASE TO 2 CAPSULES EVERY NIGHT AT BEDTIME 03/02/22 03/02/23 Yes Claiborne Rigg, NP  omeprazole (PRILOSEC) 40 MG capsule Take 1 capsule (40 mg total) by mouth daily. 11/29/21  Yes Claiborne Rigg, NP  Prasterone Andreas Newport) 6.5 MG INST Insert one capsule vaginally 10/24/19  Yes Lesly Dukes, MD  PRED MILD 0.12 % ophthalmic suspension Place 1 drop into the left eye 4 (four) times daily. 02/10/21  Yes [provider]  prednisoLONE acetate (PRED FORTE) 1 % ophthalmic suspension Place 1 drop into the left eye 4 (four) times daily. 01/30/21  Yes [provider]  promethazine-dextromethorphan (PROMETHAZINE-DM) 6.25-15 MG/5ML syrup Take 5 mLs by mouth 4 (four) times daily as needed for cough. 06/03/22  Yes Claiborne Rigg, NP  ramipril (ALTACE) 2.5 MG capsule TAKE 1 CAPSULE(2.5 MG) BY MOUTH DAILY 12/17/21  Yes Newlin, Enobong, MD  cyclopentolate (CYCLODRYL,CYCLOGYL) 1 % ophthalmic solution SMARTSIG:1 Drop(s) Left  Eye PRN Patient not taking: Reported on 11/02/2022 01/30/21   [provider]  estradiol (VIVELLE-DOT) 0.0375 MG/24HR PLACE 1 PATCH ONTO THE SKIN 2 (TWO) TIMES A WEEK. 04/22/20 04/22/21  Lesly Dukes, MD  progesterone (PROMETRIUM) 200 MG capsule TAKE 1 CAPSULE (200 MG TOTAL) BY MOUTH DAILY. 04/22/20 04/22/21  Lesly Dukes, MD  lisinopril (ZESTRIL) 2.5 MG tablet  Take 1 tablet (2.5 mg total) by mouth 2 (two) times daily. 09/12/19 04/22/20  Hoy Register, MD    ROS: Neg cardiac Neg GI Neg GU Neg MS Neg psych Neg neuro  Objective:   Vitals:   11/02/22 0949  BP: (!) 147/83  Pulse: 82  SpO2: 100%  Weight: 199 lb 3.2 oz (90.4 kg)  Height: 5\' 2"  (1.575 m)   Exam General appearance : Awake, alert, not in any distress. Speech Clear. Not toxic looking HEENT: Atraumatic and Normocephalic Neck: Supple, no JVD. No cervical lymphadenopathy.  Chest: Good air entry bilaterally, CTAB.  No rales/rhonchi/wheezing CVS: S1 S2 regular, no murmurs.  Extremities: B/L Lower Ext shows no edema, both legs are warm to touch Neurology: Awake alert, and oriented X 3, CN II-XII intact, Non focal Skin: No Rash  Data Review Lab Results  Component Value Date   HGBA1C 7.8 (A) 11/02/2022   HGBA1C 7.6 (H) 06/03/2022   HGBA1C 6.5 03/02/2022    Assessment & Plan   1. Type 2 diabetes mellitus with hyperglycemia, unspecified whether long term insulin use (HCC) Will increase dose trulicity.  She did not tolerate trulicity 1.5 well when she was taking it before but she was also on metformin at the time which upsets her stomach.  She is willing to try the 1.5 on trulicity (without metformin) to see how she does.  She will also work on diabetic diet - Glucose (CBG) - HgB A1c - Dulaglutide (TRULICITY) 1.5 MG/0.5ML SOPN; Inject 1.5 mg into the skin once a week.  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   - albuterol (VENTOLIN HFA) 108 (90 Base) MCG/ACT inhaler; Inhale 2 puffs into the lungs every 6 (six) hours as needed for wheezing or shortness of breath.  Dispense: 18 g; Refill: 2  3. Dyslipidemia, goal LDL below 70 - Lipid panel  4. Primary hypertension 129/77 on recheck.  Continue current regimen - Comprehensive metabolic panel  5. Hormone replacement therapy And  due for pap - Ambulatory referral to Gynecology  Return in about 3 months (around 02/02/2023) for PCP for chronic conditions.  The patient was given clear instructions to go to ER or return to medical center if symptoms don't improve, worsen or new problems develop. The patient verbalized understanding. The patient was told to call to get lab results if they haven't heard anything in the next week.      Georgian Co, PA-C [facility] and [facility] [place], [place] [phone]   11/02/2022, 10:16 AM

## 2022-11-03 LAB — COMPREHENSIVE METABOLIC PANEL
ALT: 19 IU/L (ref 0–32)
AST: 13 IU/L (ref 0–40)
Albumin/Globulin Ratio: 1.5 (ref 1.2–2.2)
Albumin: 4.5 g/dL (ref 3.8–4.9)
Alkaline Phosphatase: 76 IU/L (ref 44–121)
BUN/Creatinine Ratio: 11 (ref 9–23)
BUN: 9 mg/dL (ref 6–24)
Bilirubin Total: 0.5 mg/dL (ref 0.0–1.2)
CO2: 25 mmol/L (ref 20–29)
Calcium: 9.9 mg/dL (ref 8.7–10.2)
Chloride: 103 mmol/L (ref 96–106)
Creatinine, Ser: 0.81 mg/dL (ref 0.57–1.00)
Globulin, Total: 3.1 g/dL (ref 1.5–4.5)
Glucose: 131 mg/dL — ABNORMAL HIGH (ref 70–99)
Potassium: 4.1 mmol/L (ref 3.5–5.2)
Sodium: 142 mmol/L (ref 134–144)
Total Protein: 7.6 g/dL (ref 6.0–8.5)
eGFR: 84 mL/min/{1.73_m2} (ref >59)

## 2022-11-03 LAB — LIPID PANEL
Chol/HDL Ratio: 3.7 ratio (ref 0.0–4.4)
Cholesterol, Total: 165 mg/dL (ref 100–199)
HDL: 45 mg/dL (ref >39)
LDL Chol Calc (NIH): 104 mg/dL — ABNORMAL HIGH (ref 0–99)
Triglycerides: 84 mg/dL (ref 0–149)
VLDL Cholesterol Cal: 16 mg/dL (ref 5–40)

## 2022-12-23 ENCOUNTER — Encounter: Payer: 59 | Admitting: Obstetrics & Gynecology

## 2022-12-28 ENCOUNTER — Other Ambulatory Visit: Payer: Self-pay | Admitting: Nurse Practitioner

## 2023-01-16 ENCOUNTER — Other Ambulatory Visit: Payer: Self-pay | Admitting: Nurse Practitioner

## 2023-01-16 ENCOUNTER — Other Ambulatory Visit: Payer: Self-pay | Admitting: Family Medicine

## 2023-01-16 DIAGNOSIS — E1165 Type 2 diabetes mellitus with hyperglycemia: Secondary | ICD-10-CM

## 2023-01-16 NOTE — Telephone Encounter (Signed)
Medication Refill - Medication: ramipril (ALTACE) 2.5 MG capsule     ramipril (ALTACE) 2.5 MG capsule  Has the patient contacted their pharmacy? Yes.     Preferred Pharmacy (with phone number or street name):   [id number] - [place], [place] - [address] AT [facility] [address]  [address], [place] [address]  Phone:  [phone]  Fax:  [phone] Has the patient been seen for an appointment in the last year OR does the patient have an upcoming appointment? Yes.    Agent: Please be advised that RX refills may take up to 3 business days. We ask that you follow-up with your pharmacy.

## 2023-01-18 NOTE — Telephone Encounter (Signed)
Request was refilled 01/17/23, duplicate request.E-Prescribing Status: Receipt confirmed by pharmacy (01/17/2023  4:49 PM EDT).  Requested Prescriptions  Pending Prescriptions Disp Refills   ramipril (ALTACE) 2.5 MG capsule 90 capsule 1     Cardiovascular:  ACE Inhibitors Passed - 01/16/2023  2:43 PM      Passed - Cr in normal range and within 180 days    Creatinine, Ser  Date Value Ref Range Status  11/02/2022 0.81 0.57 - 1.00 mg/dL Final         Passed - K in normal range and within 180 days    Potassium  Date Value Ref Range Status  11/02/2022 4.1 3.5 - 5.2 mmol/L Final         Passed - Patient is not pregnant      Passed - Last BP in normal range    BP Readings from Last 1 Encounters:  11/02/22 129/77         Passed - Valid encounter within last 6 months    Recent Outpatient Visits           2 months ago Type 2 diabetes mellitus with hyperglycemia, unspecified whether long term insulin use [facility])   [place] [facility] [place], [place], [place]   7 months ago 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 [place], [place] W, NP   1 year ago Type 2 diabetes mellitus with hyperglycemia, unspecified whether long term insulin use [facility])   [place] [facility] [place], [place] W, NP   1 year ago Type 2 diabetes mellitus with hyperglycemia, unspecified whether long term insulin use [facility])   [place] [facility] [place], Shea Stakes, NP       Future Appointments             In 2 weeks Claiborne Rigg, NP [facility]

## 2023-02-01 ENCOUNTER — Other Ambulatory Visit: Payer: Self-pay | Admitting: Nurse Practitioner

## 2023-02-01 DIAGNOSIS — E1165 Type 2 diabetes mellitus with hyperglycemia: Secondary | ICD-10-CM

## 2023-02-01 NOTE — Telephone Encounter (Unsigned)
Copied from CRM [id number]. Topic: General - Other >> Feb 01, 2023  9:34 AM Everette C wrote: Reason for CRM: Medication Refill - Medication: Dulaglutide (TRULICITY) 1.5 MG/0.5ML SOPN [[id number]]  Has the patient contacted their pharmacy? Yes.   (Agent: If no, request that the patient contact the pharmacy for the refill. If patient does not wish to contact the pharmacy document the reason why and proceed with request.) (Agent: If yes, when and what did the pharmacy advise?)  Preferred Pharmacy (with phone number or street name): [id number] - [place], [place] - [address] [facility] [address] [place] [address] Phone: [phone] Fax: [phone] Hours: Open 24 hours   Has the patient been seen for an appointment in the last year OR does the patient have an upcoming appointment? Yes.    Agent: Please be advised that RX refills may take up to 3 business days. We ask that you follow-up with your pharmacy.

## 2023-02-02 MED ORDER — TRULICITY 1.5 MG/0.5ML ~~LOC~~ SOAJ
1.5000 mg | SUBCUTANEOUS | 3 refills | Status: DC
Start: 2023-02-02 — End: 2023-02-09

## 2023-02-02 NOTE — Telephone Encounter (Signed)
Requested Prescriptions  Pending Prescriptions Disp Refills   Dulaglutide (TRULICITY) 1.5 MG/0.5ML SOPN 1.5 mL 3    Sig: Inject 1.5 mg into the skin once a week.     Endocrinology:  Diabetes - GLP-1 Receptor Agonists Passed - 02/01/2023 10:17 AM      Passed - HBA1C is between 0 and 7.9 and within 180 days    Hemoglobin A1C  Date Value Ref Range Status  11/02/2022 7.8 (A) 4.0 - 5.6 % Final   HbA1c, POC (controlled diabetic range)  Date Value Ref Range Status  03/02/2022 6.5 0.0 - 7.0 % Final   Hgb A1c MFr Bld  Date Value Ref Range Status  06/03/2022 7.6 (H) 4.8 - 5.6 % Final    Comment:             Prediabetes: 5.7 - 6.4          Diabetes: >6.4          Glycemic control for adults with diabetes: <7.0          Passed - Valid encounter within last 6 months    Recent Outpatient Visits           3 months ago Type 2 diabetes mellitus with hyperglycemia, unspecified whether long term insulin use [facility])   [place] [facility] [place], [place], [place]   8 months ago 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 [place], [place] W, NP   1 year ago Type 2 diabetes mellitus with hyperglycemia, unspecified whether long term insulin use [facility])    [facility] [place], Shea Stakes, NP       Future Appointments             In 4 days Claiborne Rigg, NP [facility]

## 2023-02-06 ENCOUNTER — Ambulatory Visit: Payer: 59 | Admitting: Nurse Practitioner

## 2023-02-09 ENCOUNTER — Other Ambulatory Visit: Payer: Self-pay

## 2023-02-09 DIAGNOSIS — E1165 Type 2 diabetes mellitus with hyperglycemia: Secondary | ICD-10-CM

## 2023-02-09 MED ORDER — TRULICITY 1.5 MG/0.5ML ~~LOC~~ SOAJ
1.5000 mg | SUBCUTANEOUS | 3 refills | Status: DC
Start: 2023-02-09 — End: 2023-02-16

## 2023-02-09 NOTE — Telephone Encounter (Signed)
Fax received from [employer] for refill

## 2023-02-16 ENCOUNTER — Ambulatory Visit: Payer: 59 | Attending: Nurse Practitioner | Admitting: Physician Assistant

## 2023-02-16 ENCOUNTER — Encounter: Payer: Self-pay | Admitting: Physician Assistant

## 2023-02-16 VITALS — BP 143/84 | HR 75 | Wt 197.0 lb

## 2023-02-16 DIAGNOSIS — R053 Chronic cough: Secondary | ICD-10-CM | POA: Diagnosis not present

## 2023-02-16 DIAGNOSIS — K1370 Unspecified lesions of oral mucosa: Secondary | ICD-10-CM

## 2023-02-16 DIAGNOSIS — E1165 Type 2 diabetes mellitus with hyperglycemia: Secondary | ICD-10-CM

## 2023-02-16 DIAGNOSIS — M25562 Pain in left knee: Secondary | ICD-10-CM

## 2023-02-16 DIAGNOSIS — M25561 Pain in right knee: Secondary | ICD-10-CM | POA: Diagnosis not present

## 2023-02-16 DIAGNOSIS — E785 Hyperlipidemia, unspecified: Secondary | ICD-10-CM | POA: Diagnosis not present

## 2023-02-16 DIAGNOSIS — G8929 Other chronic pain: Secondary | ICD-10-CM

## 2023-02-16 DIAGNOSIS — J351 Hypertrophy of tonsils: Secondary | ICD-10-CM

## 2023-02-16 DIAGNOSIS — Z7984 Long term (current) use of oral hypoglycemic drugs: Secondary | ICD-10-CM

## 2023-02-16 DIAGNOSIS — R0683 Snoring: Secondary | ICD-10-CM

## 2023-02-16 LAB — POCT GLYCOSYLATED HEMOGLOBIN (HGB A1C): HbA1c, POC (controlled diabetic range): 7.5 % — AB (ref 0.0–7.0)

## 2023-02-16 LAB — GLUCOSE, POCT (MANUAL RESULT ENTRY): POC Glucose: 140 mg/dl — AB (ref 70–99)

## 2023-02-16 MED ORDER — MONTELUKAST SODIUM 10 MG PO TABS: 10.0000 mg | ORAL_TABLET | Freq: Every day | ORAL | 3 refills | Status: AC

## 2023-02-16 MED ORDER — ATORVASTATIN CALCIUM 80 MG PO TABS
80.0000 mg | ORAL_TABLET | Freq: Every day | ORAL | 1 refills | Status: DC
Start: 2023-02-16 — End: 2024-02-26

## 2023-02-16 MED ORDER — TRULICITY 1.5 MG/0.5ML ~~LOC~~ SOAJ
1.5000 mg | SUBCUTANEOUS | 3 refills | Status: DC
Start: 2023-02-16 — End: 2023-03-10

## 2023-02-16 MED ORDER — ACCU-CHEK GUIDE VI STRP
ORAL_STRIP | 2 refills | Status: AC
Start: 2023-02-16 — End: ?

## 2023-02-16 MED ORDER — NORTRIPTYLINE HCL 10 MG PO CAPS: ORAL_CAPSULE | ORAL | 5 refills | Status: DC

## 2023-02-16 NOTE — Progress Notes (Signed)
Patient ID: Ann Foster, female   DOB: [birth year], 57 y.o.   MRN: [id number]   Zisel Lovelace, is a 57 y.o. female  [id number]  [id number]  DOB - 1965/08/03  Chief Complaint  Patient presents with   Medical Management of Chronic Issues       Subjective:   Ann Foster is a 57 y.o. female here today for diabetes follow up.  Also she has only been taking .75 of trulicity weekly.  Feels decreased appetite for 2 days after trulicitly and had diarrhea with trulicity at 1.5mg  but only took it once.  Not taking metformin.  I did offer staying on lower dose trulicity and resuming metformin but she does not want to do that.  She is in the process of getting dental implants and they don't want to do it unless she gets her tonsils removed.  She does c/o severe snoring.  A lesion on the floor of her mouth was also found that needs to be assessed.    Her knee pain has been worsening and she is requesting referral for that.  This is longstanding. No problems updated.  ALLERGIES: No Known Allergies  PAST MEDICAL HISTORY: Past Medical History:  Diagnosis Date   Ankle fracture 2016   Right   Aortic atherosclerosis (HCC)    trace calcific atherosclerosis aortic arch per ct neck done 12-22-17   Bronchitis    Diabetes mellitus without complication (HCC)    Hypertension    OA (osteoarthritis) of shoulder    left shoulder, both knees arthritis   Osteoarthritis, knee     MEDICATIONS AT HOME: Prior to Admission medications   Medication Sig Start Date End Date Taking? Authorizing Provider  Accu-Chek Softclix Lancets lancets Check blood glucose level by fingerstick once per day. E11.65 05/07/21  Yes Hoy Register, MD  albuterol (VENTOLIN HFA) 108 (90 Base) MCG/ACT inhaler Inhale 2 puffs into the lungs every 6 (six) hours as needed for wheezing or shortness of breath. 11/02/22  Yes Georgian Co M, PA-C  Blood Glucose Monitoring Suppl (ACCU-CHEK GUIDE) w/Device KIT Check blood glucose level  by fingerstick once per day. E11.65 05/07/21  Yes Hoy Register, MD  cyclobenzaprine (FLEXERIL) 5 MG tablet Take 1 tablet (5 mg total) by mouth at bedtime as needed for muscle spasms. 03/02/22  Yes Claiborne Rigg, NP  cyclopentolate (CYCLODRYL,CYCLOGYL) 1 % ophthalmic solution  01/30/21  Yes [provider]  dapagliflozin propanediol (FARXIGA) 5 MG TABS tablet TAKE 1 TABLET BY MOUTH ONCE  DAILY BEFORE BREAKFAST 12/28/22  Yes Newlin, Enobong, MD  fluticasone (FLONASE) 50 MCG/ACT nasal spray Place 1 spray into both nostrils 2 (two) times daily. 03/02/22  Yes Claiborne Rigg, NP  omeprazole (PRILOSEC) 40 MG capsule Take 1 capsule (40 mg total) by mouth daily. 11/29/21  Yes Claiborne Rigg, NP  Prasterone [place]) 6.5 MG INST Insert one capsule vaginally 10/24/19  Yes Lesly Dukes, MD  PRED MILD 0.12 % ophthalmic suspension Place 1 drop into the left eye 4 (four) times daily. 02/10/21  Yes [provider]  prednisoLONE acetate (PRED FORTE) 1 % ophthalmic suspension Place 1 drop into the left eye 4 (four) times daily. 01/30/21  Yes [provider]  promethazine-dextromethorphan (PROMETHAZINE-DM) 6.25-15 MG/5ML syrup Take 5 mLs by mouth 4 (four) times daily as needed for cough. 06/03/22  Yes Claiborne Rigg, NP  ramipril (ALTACE) 2.5 MG capsule TAKE 1 CAPSULE(2.5 MG) BY MOUTH DAILY 01/17/23  Yes Hoy Register, MD  atorvastatin (LIPITOR) 80  MG tablet Take 1 tablet (80 mg total) by mouth daily. 02/16/23   Anders Simmonds, PA-C  Dulaglutide (TRULICITY) 1.5 MG/0.5ML SOPN Inject 1.5 mg into the skin once a week. 02/16/23   Anders Simmonds, PA-C  estradiol (VIVELLE-DOT) 0.0375 MG/24HR PLACE 1 PATCH ONTO THE SKIN 2 (TWO) TIMES A WEEK. 04/22/20 04/22/21  Lesly Dukes, MD  glucose blood (ACCU-CHEK GUIDE) test strip Check blood glucose level by fingerstick once per day. E11.65 02/16/23   Anders Simmonds, PA-C  montelukast (SINGULAIR) 10 MG tablet Take 1 tablet (10 mg total) by mouth  at bedtime. 02/16/23   Anders Simmonds, PA-C  nortriptyline (PAMELOR) 10 MG capsule TAKE 1 CAPSULE BY MOUTH EVERY NIGHT AT BEDTIME FOR 2 WEEKS THEN INCREASE TO 2 CAPSULES EVERY NIGHT AT BEDTIME 02/16/23 02/16/24  Anders Simmonds, PA-C  progesterone (PROMETRIUM) 200 MG capsule TAKE 1 CAPSULE (200 MG TOTAL) BY MOUTH DAILY. 04/22/20 04/22/21  Lesly Dukes, MD  lisinopril (ZESTRIL) 2.5 MG tablet Take 1 tablet (2.5 mg total) by mouth 2 (two) times daily. 09/12/19 04/22/20  Hoy Register, MD    ROS: Neg resp Neg cardiac Neg GI Neg GU Neg psych Neg neuro  Objective:   Vitals:   02/16/23 1401  BP: (!) 143/84  Pulse: 75  SpO2: 98%  Weight: 197 lb (89.4 kg)   Exam General appearance : Awake, alert, not in any distress. Speech Clear. Not toxic looking HEENT: Atraumatic and Normocephalic Neck: Supple, no JVD. No cervical lymphadenopathy.  Chest: Good air entry bilaterally, CTAB.  No rales/rhonchi/wheezing CVS: S1 S2 regular, no murmurs.  Extremities: B/L Lower Ext shows no edema, both legs are warm to touch B knes with *80% normal ROM.  No ballotment or erythema.  Crepitus with extension and flexion.  No point tenderness and ligaments are stable.  Neurology: Awake alert, and oriented X 3, CN II-XII intact, Non focal Skin: No Rash  Data Review Lab Results  Component Value Date   HGBA1C 7.5 (A) 02/16/2023   HGBA1C 7.8 (A) 11/02/2022   HGBA1C 7.6 (H) 06/03/2022    Assessment & Plan   1. Type 2 diabetes mellitus with hyperglycemia, unspecified whether long term insulin use (HCC) Improved but not at goal.   - Glucose (CBG) - HgB A1c - Dulaglutide (TRULICITY) 1.5 MG/0.5ML SOPN; Inject 1.5 mg into the skin once a week.  Dispense: 1.5 mL; Refill: 3 - atorvastatin (LIPITOR) 80 MG tablet; Take 1 tablet (80 mg total) by mouth daily.  Dispense: 90 tablet; Refill: 1 - glucose blood (ACCU-CHEK GUIDE) test strip; Check blood glucose level by fingerstick once per day. E11.65  Dispense:  100 each; Refill: 2 If SE to trulicity will decrease dose and either add glipizide or metformin)  2. Dyslipidemia, goal LDL below 70 - atorvastatin (LIPITOR) 80 MG tablet; Take 1 tablet (80 mg total) by mouth daily.  Dispense: 90 tablet; Refill: 1  3. Chronic pain of both knees On nortriptyline for chronic pain - Ambulatory referral to Orthopedic Surgery  4. Chronic cough - montelukast (SINGULAIR) 10 MG tablet; Take 1 tablet (10 mg total) by mouth at bedtime.  Dispense: 30 tablet; Refill: 3  5. Large tonsils - Ambulatory referral to ENT  6. Snoring - Ambulatory referral to ENT  7. Mouth lesion - Ambulatory referral to ENT    Return in about 4 months (around 06/18/2023) for PCP for chronic conditions([place]).  The patient was given clear instructions to go to ER or return to medical  center if symptoms don't improve, worsen or new problems develop. The patient verbalized understanding. The patient was told to call to get lab results if they haven't heard anything in the next week.      Georgian Co, PA-C [facility] [place], [place] [phone]   02/16/2023, 2:31 PM

## 2023-02-17 ENCOUNTER — Encounter: Payer: Self-pay | Admitting: Obstetrics and Gynecology

## 2023-02-17 ENCOUNTER — Ambulatory Visit (INDEPENDENT_AMBULATORY_CARE_PROVIDER_SITE_OTHER): Payer: 59 | Admitting: Obstetrics and Gynecology

## 2023-02-17 VITALS — BP 133/69 | HR 80 | Ht 63.0 in | Wt 194.0 lb

## 2023-02-17 DIAGNOSIS — N952 Postmenopausal atrophic vaginitis: Secondary | ICD-10-CM | POA: Diagnosis not present

## 2023-02-17 DIAGNOSIS — R6882 Decreased libido: Secondary | ICD-10-CM

## 2023-02-17 MED ORDER — ESTRADIOL 0.1 MG/GM VA CREA
TOPICAL_CREAM | VAGINAL | 12 refills | Status: AC
Start: 2023-02-17 — End: ?

## 2023-02-17 MED ORDER — BUPROPION HCL ER (SR) 150 MG PO TB12
150.0000 mg | ORAL_TABLET | Freq: Two times a day (BID) | ORAL | 2 refills | Status: DC
Start: 2023-02-17 — End: 2023-06-12

## 2023-02-17 NOTE — Progress Notes (Unsigned)
NEW GYNECOLOGY PATIENT Patient name: Ann Foster MRN [id number]  Date of birth: 1965-06-07 Chief Complaint:   No chief complaint on file.     History:  Ameila Filosa is a 57 y.o. G0P0000 being seen today for discussion of HRT.    Noticed some bumps on the labia. Just wondering what it is. Has been there for a while - one big one. Nothing has drained from it. Not painful. No abnormal discharge. Currently sexually active, female partner, long term.   HRT: was preivously on and recently ran out about 3 months. Since having been on off it. Hot flahses have eezed down a bit but mostly noticing the vaginal dryness. Has some desire for intercourse but not as much as before. Primarily bothered by the night sweats. Has used vaginal suppositories previously for vaginal dryness. Some nights does not have them as frequently. Has changed diet.       Gynecologic History No LMP recorded. Patient is postmenopausal. Contraception: post menopausal status Last Pap:     Component Value Date/Time   DIAGPAP  07/29/2019 1051    - Negative for Intraepithelial Lesions or Malignancy (NILM)   DIAGPAP - Benign reactive/reparative changes 07/29/2019 1051   HPVHIGH Negative 07/29/2019 1051   ADEQPAP  07/29/2019 1051    Satisfactory for evaluation; transformation zone component PRESENT.    High Risk HPV: Positive  Adequacy:  Satisfactory for evaluation, transformation zone component PRESENT  Diagnosis:  Atypical squamous cells of undetermined significance (ASC-US)  Last Mammogram: 11/2021 BIRADS  1 Last Colonoscopy: 2022  Obstetric History OB History  Gravida Para Term Preterm AB Living  0 0 0 0 0 0  SAB IAB Ectopic Multiple Live Births  0 0 0 0 0    Past Medical History:  Diagnosis Date   Ankle fracture 2016   Right   Aortic atherosclerosis (HCC)    trace calcific atherosclerosis aortic arch per ct neck done 12-22-17   Bronchitis    Diabetes mellitus without complication (HCC)     Hypertension    OA (osteoarthritis) of shoulder    left shoulder, both knees arthritis   Osteoarthritis, knee     Past Surgical History:  Procedure Laterality Date   COLONOSCOPY     NO PAST SURGERIES      Current Outpatient Medications on File Prior to Visit  Medication Sig Dispense Refill   Accu-Chek Softclix Lancets lancets Check blood glucose level by fingerstick once per day. E11.65 100 each 2   albuterol (VENTOLIN HFA) 108 (90 Base) MCG/ACT inhaler Inhale 2 puffs into the lungs every 6 (six) hours as needed for wheezing or shortness of breath. 18 g 2   atorvastatin (LIPITOR) 80 MG tablet Take 1 tablet (80 mg total) by mouth daily. 90 tablet 1   Blood Glucose Monitoring Suppl (ACCU-CHEK GUIDE) w/Device KIT Check blood glucose level by fingerstick once per day. E11.65 1 kit 0   cyclobenzaprine (FLEXERIL) 5 MG tablet Take 1 tablet (5 mg total) by mouth at bedtime as needed for muscle spasms. 30 tablet 1   cyclopentolate (CYCLODRYL,CYCLOGYL) 1 % ophthalmic solution      dapagliflozin propanediol (FARXIGA) 5 MG TABS tablet TAKE 1 TABLET BY MOUTH ONCE  DAILY BEFORE BREAKFAST 90 tablet 1   Dulaglutide (TRULICITY) 1.5 MG/0.5ML SOPN Inject 1.5 mg into the skin once a week. 1.5 mL 3   fluticasone (FLONASE) 50 MCG/ACT nasal spray Place 1 spray into both nostrils 2 (two) times daily. 16 g 1  glucose blood (ACCU-CHEK GUIDE) test strip Check blood glucose level by fingerstick once per day. E11.65 100 each 2   montelukast (SINGULAIR) 10 MG tablet Take 1 tablet (10 mg total) by mouth at bedtime. 30 tablet 3   nortriptyline (PAMELOR) 10 MG capsule TAKE 1 CAPSULE BY MOUTH EVERY NIGHT AT BEDTIME FOR 2 WEEKS THEN INCREASE TO 2 CAPSULES EVERY NIGHT AT BEDTIME 60 capsule 5   omeprazole (PRILOSEC) 40 MG capsule Take 1 capsule (40 mg total) by mouth daily. 90 capsule 3   Prasterone (INTRAROSA) 6.5 MG INST Insert one capsule vaginally 28 each 3   PRED MILD 0.12 % ophthalmic suspension Place 1 drop into the  left eye 4 (four) times daily.     prednisoLONE acetate (PRED FORTE) 1 % ophthalmic suspension Place 1 drop into the left eye 4 (four) times daily.     promethazine-dextromethorphan (PROMETHAZINE-DM) 6.25-15 MG/5ML syrup Take 5 mLs by mouth 4 (four) times daily as needed for cough. 240 mL 0   ramipril (ALTACE) 2.5 MG capsule TAKE 1 CAPSULE(2.5 MG) BY MOUTH DAILY 90 capsule 0   estradiol (VIVELLE-DOT) 0.0375 MG/24HR PLACE 1 PATCH ONTO THE SKIN 2 (TWO) TIMES A WEEK. 8 patch 12   progesterone (PROMETRIUM) 200 MG capsule TAKE 1 CAPSULE (200 MG TOTAL) BY MOUTH DAILY. 30 capsule 3   [DISCONTINUED] lisinopril (ZESTRIL) 2.5 MG tablet Take 1 tablet (2.5 mg total) by mouth 2 (two) times daily. 60 tablet 1   No current facility-administered medications on file prior to visit.    No Known Allergies  Social History:  reports that she quit smoking about 3 years ago. Her smoking use included cigarettes. She has never used smokeless tobacco. She reports that she does not currently use alcohol. She reports that she does not use drugs.  Family History  Problem Relation Age of Onset   Breast cancer Mother    Colon cancer Father 50   Diabetes Son    Esophageal cancer Neg Hx    Stomach cancer Neg Hx    Liver disease Neg Hx    Pancreatic cancer Neg Hx    Rectal cancer Neg Hx     The following portions of the patient's history were reviewed and updated as appropriate: allergies, current medications, past family history, past medical history, past social history, past surgical history and problem list.  Review of Systems Pertinent items noted in HPI and remainder of comprehensive ROS otherwise negative.  Physical Exam:  BP 133/69   Pulse 80   Ht 5\' 3"  (1.6 m)   Wt 194 lb (88 kg)   BMI 34.37 kg/m  Physical Exam Vitals and nursing note reviewed. Exam conducted with a chaperone present.  Constitutional:      Appearance: Normal appearance.  Cardiovascular:     Rate and Rhythm: Normal rate.   Pulmonary:     Effort: Pulmonary effort is normal.     Breath sounds: Normal breath sounds.  Genitourinary:    Comments: Multiple inclusion cysts noted on labia Neurological:     General: No focal deficit present.     Mental Status: She is alert and oriented to person, place, and time.  Psychiatric:        Mood and Affect: Mood normal.        Behavior: Behavior normal.        Thought Content: Thought content normal.        Judgment: Judgment normal.        Assessment and Plan:  1. Vaginal atrophy Discussed that there are therapies that are targeted directly at managing vaginal dryness/GSM. Will start with vaginal estradiol. Noted that may take up to 2 months to see improvement and that dryness can return after stopping use.  - estradiol (ESTRACE) 0.1 MG/GM vaginal cream; Apply 1 gram per vagina every night for 2 weeks, then apply three times a week  Dispense: 42.5 g; Refill: 12  2. Decreased libido Discussed that decreased libido is common in the postmenopausal state. Can trial wellbutrin for decreased desire. Can see if hot flashes also respond to hot flashes or not. Advised to watch for any disturbance to sleep. Patient not a candidate for HRT to due to elevated CVD risk.  - buPROPion (WELLBUTRIN SR) 150 MG 12 hr tablet; Take 1 tablet (150 mg total) by mouth 2 (two) times daily.  Dispense: 60 tablet; Refill: 2   Routine preventative health maintenance measures emphasized. Please refer to After Visit Summary for other counseling recommendations.   Follow-up: Return for GYN Follow Up. 4-6 weeks      Lorriane Shire, MD Obstetrician & Gynecologist, Faculty Practice [facility]imally In[facility] Gynecologic [facility], [address] [facility]

## 2023-02-17 NOTE — Patient Instructions (Signed)
Appears to be inclusion cysts on the labia - these are benign

## 2023-02-21 ENCOUNTER — Ambulatory Visit (INDEPENDENT_AMBULATORY_CARE_PROVIDER_SITE_OTHER): Payer: 59 | Admitting: Orthopaedic Surgery

## 2023-02-21 ENCOUNTER — Other Ambulatory Visit (INDEPENDENT_AMBULATORY_CARE_PROVIDER_SITE_OTHER): Payer: 59

## 2023-02-21 DIAGNOSIS — M1712 Unilateral primary osteoarthritis, left knee: Secondary | ICD-10-CM

## 2023-02-21 DIAGNOSIS — M1711 Unilateral primary osteoarthritis, right knee: Secondary | ICD-10-CM | POA: Diagnosis not present

## 2023-02-21 NOTE — Progress Notes (Signed)
Office Visit Note   Patient: Ann Foster           Date of Birth: Sep 24, 1965           MRN: [id number] Visit Date: 02/21/2023              Requested by: Anders Simmonds, PA-C [address] [place],  [place] [address] PCP: Claiborne Rigg, NP   Assessment & Plan: Visit Diagnoses:  1. Unilateral primary osteoarthritis, left knee   2. Unilateral primary osteoarthritis, right knee     Plan:  Impression is severe right knee degenerative joint disease secondary to Osteoarthritis.  Bone on bone joint space narrowing is seen on radiographs with neutral alignment.  At this point, conservative treatments fail to provide any significant relief and the pain is severely affecting ADLs and quality of life.  Based on treatment options, the patient has elected to move forward with a knee replacement.  We have discussed the surgical risks that include but are not limited to infection, DVT, leg length discrepancy, stiffness, numbness, tingling, incomplete relief of pain.  Recovery and prognosis were also reviewed.    Anticoagulants: No antithrombotic Postop anticoagulation: xarelto due to smoking history Diabetic: Yes A1C from 02/16/23 7.5 Nickel allergy: No Prior DVT/PE: No Tobacco use: Yes.  Previously smoked 1 pack every 2-3 days.  Has recently purchased nicotine patches Clearances needed for surgery: pcp Anticipated discharge dispo: Home with daughter   Follow-Up Instructions: Return for post-op.   Orders:  Orders Placed This Encounter  Procedures   XR KNEE 3 VIEW RIGHT   XR KNEE 3 VIEW LEFT   No orders of the defined types were placed in this encounter.     Procedures: No procedures performed   Clinical Data: No additional findings.   Subjective: Chief Complaint  Patient presents with   Left Knee - Pain   Right Knee - Pain    HPI patient is a pleasant 57 year old female who comes in today with bilateral knee pain right greater than left for the past  several years.  She denies any injury.  She does note her symptoms have progressively worsened.  Pain is to the entire aspect of both knees and is constant.  Symptoms are worse with standing as well as going from a seated to standing position and stair climbing.  She has associated swelling, clicking and grinding.  She has been taking Tylenol without relief.  She is undergone cortisone injections to both knees over a year ago which did not provide relief.  No viscosupplementation injection to either knee.  Review of Systems as detailed in HPI.  All others reviewed and are negative.   Objective: Vital Signs: There were no vitals taken for this visit.  Physical Exam well-developed well-nourished female no acute distress.  Alert and oriented x 3.  Ortho Exam bilateral knee exam shows moderate effusion on the right and a small effusion on the left.  Range of motion 0 to 90 degrees with pain and crepitus.  Medial and lateral joint line tenderness.  Moderate patellofemoral crepitus.  She is stable to valgus varus stress.  She is neurovascularly intact distally.  Specialty Comments:  No specialty comments available.  Imaging: XR KNEE 3 VIEW LEFT  Result Date: 02/21/2023 X-rays of the left knee show advanced tricompartmental degenerative joint disease.  Bone-on-bone joint space narrowing.  Varus deformity.  XR KNEE 3 VIEW RIGHT  Result Date: 02/21/2023 X-rays demonstrate severe osteoarthritis.  Bone-on-bone joint  space narrowing. Varus deformity.    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, with long-term current use of insulin (HCC) 04/25/2022   Amblyopia of eye, right 07/27/2021   Corneal guttata of left eye 07/27/2021   Influenza vaccine refused 07/29/2020   COVID-19 vaccine dose declined 07/29/2020   Vasomotor symptoms due to menopause 04/24/2020   Atrophic  vaginitis 04/24/2020   Impingement syndrome of left shoulder 01/23/2018   Past Medical History:  Diagnosis Date   Ankle fracture 2016   Right   Aortic atherosclerosis (HCC)    trace calcific atherosclerosis aortic arch per ct neck done 12-22-17   Bronchitis    Diabetes mellitus without complication (HCC)    Hypertension    OA (osteoarthritis) of shoulder    left shoulder, both knees arthritis   Osteoarthritis, knee     Family History  Problem Relation Age of Onset   Breast cancer Mother    Colon cancer Father 42   Diabetes Son    Esophageal cancer Neg Hx    Stomach cancer Neg Hx    Liver disease Neg Hx    Pancreatic cancer Neg Hx    Rectal cancer Neg Hx     Past Surgical History:  Procedure Laterality Date   COLONOSCOPY     NO PAST SURGERIES     Social History   Occupational History   Not on file  Tobacco Use   Smoking status: Former    Current packs/day: 0.00    Types: Cigarettes    Quit date: 11/28/2019    Years since quitting: 3.2   Smokeless tobacco: Never   Tobacco comments:    smokes 1-2 cigarettes day now  Vaping Use   Vaping status: Never Used  Substance and Sexual Activity   Alcohol use: Not Currently    Comment: Occasional   Drug use: No   Sexual activity: Not Currently    Birth control/protection: Condom

## 2023-03-01 ENCOUNTER — Telehealth: Payer: Self-pay

## 2023-03-01 ENCOUNTER — Ambulatory Visit: Payer: 59 | Attending: Nurse Practitioner

## 2023-03-01 VITALS — Ht 63.0 in | Wt 194.0 lb

## 2023-03-01 DIAGNOSIS — Z0000 Encounter for general adult medical examination without abnormal findings: Secondary | ICD-10-CM | POA: Diagnosis not present

## 2023-03-01 NOTE — Telephone Encounter (Signed)
Patient is asking for orders for a cane to be placed and is also asking for status of referral to ENT.

## 2023-03-01 NOTE — Patient Instructions (Signed)
Ann Foster , Thank you for taking time to come for your Medicare Wellness Visit. I appreciate your ongoing commitment to your health goals. Please review the following plan we discussed and let me know if I can assist you in the future.   Referrals/Orders/Follow-Ups/Clinician Recommendations: Aim for 30 minutes of exercise or brisk walking, 6-8 glasses of water, and 5 servings of fruits and vegetables each day.  This is a list of the screening recommended for you and due dates:  Health Maintenance  Topic Date Due   Complete foot exam   Never done   Eye exam for diabetics  Never done   HIV Screening  Never done   Zoster (Shingles) Vaccine (1 of 2) Never done   Stool Blood Test  03/31/2022   COVID-19 Vaccine (1 - 2023-24 season) Never done   Yearly kidney health urinalysis for diabetes  03/03/2023   Hemoglobin A1C  08/16/2023   Yearly kidney function blood test for diabetes  11/02/2023   Mammogram  12/09/2023   Medicare Annual Wellness Visit  02/29/2024   Colon Cancer Screening  03/31/2024   Pap with HPV screening  07/28/2024   DTaP/Tdap/Td vaccine (2 - Td or Tdap) 05/21/2029   Hepatitis C Screening  Completed   HPV Vaccine  Aged Out   Flu Shot  Discontinued    Advanced directives: (ACP Link)Information on Advanced Care Planning can be found at [facility] Advance Health Care Directives Advance Health Care Directives ([email or web address]   Next Medicare Annual Wellness Visit scheduled for next year: Yes

## 2023-03-01 NOTE — Progress Notes (Signed)
Subjective:   Ann Foster is a 57 y.o. female who presents for an Initial Medicare Annual Wellness Visit.  Visit Complete: Virtual  I connected with  Ann Foster on 03/01/23 by a audio enabled telemedicine application and verified that I am speaking with the correct person using two identifiers.  Patient Location: Home  Provider Location: Home Office  I discussed the limitations of evaluation and management by telemedicine. The patient expressed understanding and agreed to proceed.  Because this visit was a virtual/telehealth visit, some criteria may be missing or patient reported. Any vitals not documented were not able to be obtained and vitals that have been documented are patient reported.   Cardiac Risk Factors include: advanced age (>69men, >69 women);diabetes mellitus;dyslipidemia;hypertension     Objective:    Today's Vitals   03/01/23 1545  Weight: 194 lb (88 kg)  Height: 5\' 3"  (1.6 m)   Body mass index is 34.37 kg/m.     03/01/2023    3:54 PM 06/01/2020   10:32 AM 01/31/2020   12:41 PM 11/19/2019   10:26 AM 10/29/2019    2:09 PM 06/22/2019    9:43 PM 12/22/2017    5:34 PM  Advanced Directives  Does Patient Have a Medical Advance Directive? No No No No No No No  Would patient like information on creating a medical advance directive? Yes (MAU/Ambulatory/Procedural Areas - Information given)   No - Patient declined No - Patient declined  No - Patient declined    Current Medications (verified) Outpatient Encounter Medications as of 03/01/2023  Medication Sig   Accu-Chek Softclix Lancets lancets Check blood glucose level by fingerstick once per day. E11.65   albuterol (VENTOLIN HFA) 108 (90 Base) MCG/ACT inhaler Inhale 2 puffs into the lungs every 6 (six) hours as needed for wheezing or shortness of breath.   atorvastatin (LIPITOR) 80 MG tablet Take 1 tablet (80 mg total) by mouth daily.   Blood Glucose Monitoring Suppl (ACCU-CHEK GUIDE) w/Device KIT Check  blood glucose level by fingerstick once per day. E11.65   buPROPion (WELLBUTRIN SR) 150 MG 12 hr tablet Take 1 tablet (150 mg total) by mouth 2 (two) times daily.   cyclobenzaprine (FLEXERIL) 5 MG tablet Take 1 tablet (5 mg total) by mouth at bedtime as needed for muscle spasms.   cyclopentolate (CYCLODRYL,CYCLOGYL) 1 % ophthalmic solution    dapagliflozin propanediol (FARXIGA) 5 MG TABS tablet TAKE 1 TABLET BY MOUTH ONCE  DAILY BEFORE BREAKFAST   Dulaglutide (TRULICITY) 1.5 MG/0.5ML SOPN Inject 1.5 mg into the skin once a week.   estradiol (ESTRACE) 0.1 MG/GM vaginal cream Apply 1 gram per vagina every night for 2 weeks, then apply three times a week   fluticasone (FLONASE) 50 MCG/ACT nasal spray Place 1 spray into both nostrils 2 (two) times daily.   glucose blood (ACCU-CHEK GUIDE) test strip Check blood glucose level by fingerstick once per day. E11.65   montelukast (SINGULAIR) 10 MG tablet Take 1 tablet (10 mg total) by mouth at bedtime.   nortriptyline (PAMELOR) 10 MG capsule TAKE 1 CAPSULE BY MOUTH EVERY NIGHT AT BEDTIME FOR 2 WEEKS THEN INCREASE TO 2 CAPSULES EVERY NIGHT AT BEDTIME   omeprazole (PRILOSEC) 40 MG capsule Take 1 capsule (40 mg total) by mouth daily.   Prasterone (INTRAROSA) 6.5 MG INST Insert one capsule vaginally   PRED MILD 0.12 % ophthalmic suspension Place 1 drop into the left eye 4 (four) times daily.   prednisoLONE acetate (PRED FORTE) 1 % ophthalmic  suspension Place 1 drop into the left eye 4 (four) times daily.   promethazine-dextromethorphan (PROMETHAZINE-DM) 6.25-15 MG/5ML syrup Take 5 mLs by mouth 4 (four) times daily as needed for cough.   ramipril (ALTACE) 2.5 MG capsule TAKE 1 CAPSULE(2.5 MG) BY MOUTH DAILY   estradiol (VIVELLE-DOT) 0.0375 MG/24HR PLACE 1 PATCH ONTO THE SKIN 2 (TWO) TIMES A WEEK.   progesterone (PROMETRIUM) 200 MG capsule TAKE 1 CAPSULE (200 MG TOTAL) BY MOUTH DAILY.   [DISCONTINUED] lisinopril (ZESTRIL) 2.5 MG tablet Take 1 tablet (2.5 mg total)  by mouth 2 (two) times daily.   No facility-administered encounter medications on file as of 03/01/2023.    Allergies (verified) Patient has no known allergies.   History: Past Medical History:  Diagnosis Date   Ankle fracture 2016   Right   Aortic atherosclerosis (HCC)    trace calcific atherosclerosis aortic arch per ct neck done 12-22-17   Bronchitis    Diabetes mellitus without complication (HCC)    Hypertension    OA (osteoarthritis) of shoulder    left shoulder, both knees arthritis   Osteoarthritis, knee    Past Surgical History:  Procedure Laterality Date   COLONOSCOPY     NO PAST SURGERIES     Family History  Problem Relation Age of Onset   Breast cancer Mother    Colon cancer Father 47   Diabetes Son    Esophageal cancer Neg Hx    Stomach cancer Neg Hx    Liver disease Neg Hx    Pancreatic cancer Neg Hx    Rectal cancer Neg Hx    Social History   Socioeconomic History   Marital status: Single    Spouse name: Not on file   Number of children: 3   Years of education: Not on file   Highest education level: High school graduate  Occupational History   Not on file  Tobacco Use   Smoking status: Former    Current packs/day: 0.00    Types: Cigarettes    Quit date: 11/28/2019    Years since quitting: 3.2   Smokeless tobacco: Never   Tobacco comments:    smokes 1-2 cigarettes day now  Vaping Use   Vaping status: Never Used  Substance and Sexual Activity   Alcohol use: Not Currently    Comment: Occasional   Drug use: No   Sexual activity: Not Currently    Birth control/protection: Condom  Other Topics Concern   Not on file  Social History Narrative   Left Handed   One story home    Social Determinants of Health   Financial Resource Strain: Low Risk  (03/01/2023)   Overall Financial Resource Strain (CARDIA)    Difficulty of Paying Living Expenses: Not hard at all  Food Insecurity: No Food Insecurity (03/01/2023)   Hunger Vital Sign    Worried  About Running Out of Food in the Last Year: Never true    Ran Out of Food in the Last Year: Never true  Transportation Needs: No Transportation Needs (03/01/2023)   PRAPARE - Administrator, Civil Service (Medical): No    Lack of Transportation (Non-Medical): No  Physical Activity: Inactive (03/01/2023)   Exercise Vital Sign    Days of Exercise per Week: 0 days    Minutes of Exercise per Session: 0 min  Stress: No Stress Concern Present (03/01/2023)   [employer] of Occupational Health - Occupational Stress Questionnaire    Feeling of Stress : Not at all  Social Connections: Moderately Isolated (03/01/2023)   Social Connection and Isolation Panel [NHANES]    Frequency of Communication with Friends and Family: More than three times a week    Frequency of Social Gatherings with Friends and Family: Three times a week    Attends Religious Services: 1 to 4 times per year    Active Member of Clubs or Organizations: No    Attends Banker Meetings: Never    Marital Status: Never married    Tobacco Counseling Counseling given: Not Answered Tobacco comments: smokes 1-2 cigarettes day now   Clinical Intake:  Pre-visit preparation completed: Yes  Pain : No/denies pain     Diabetes: No  How often do you need to have someone help you when you read instructions, pamphlets, or other written materials from your doctor or pharmacy?: 1 - Never  Interpreter Needed?: No  Information entered by :: Kandis Fantasia LPN   Activities of Daily Living    03/01/2023    3:53 PM  In your present state of health, do you have any difficulty performing the following activities:  Hearing? 0  Vision? 0  Difficulty concentrating or making decisions? 0  Walking or climbing stairs? 1  Dressing or bathing? 0  Doing errands, shopping? 0  Preparing Food and eating ? N  Using the Toilet? N  In the past six months, have you accidently leaked urine? N  Do you have problems with  loss of bowel control? N  Managing your Medications? N  Managing your Finances? N  Housekeeping or managing your Housekeeping? N    Patient Care Team: Claiborne Rigg, NP as PCP - General (Nurse Practitioner) Glendale Chard, DO as Consulting Physician (Neurology)  Indicate any recent Medical Services you may have received from other than [facility] providers in the past year (date may be approximate).     Assessment:   This is a routine wellness examination for Vylet.  Hearing/Vision screen Hearing Screening - Comments:: Denies hearing difficulties   Vision Screening - Comments:: Wears rx glasses - up to date with routine eye exams with Dr. Arnetha Gula     Goals Addressed             This Visit's Progress    Remain active and independent        Depression Screen    03/01/2023    3:51 PM 11/02/2022    9:54 AM 06/03/2022   10:26 AM 03/02/2022   11:01 AM 11/29/2021    2:50 PM 08/16/2021   11:04 AM 03/10/2021    4:27 PM  PHQ 2/9 Scores  PHQ - 2 Score 0 0 0 0 0 0 0  PHQ- 9 Score  0 0 0 0 0 0    Fall Risk    03/01/2023    3:53 PM 02/16/2023    2:03 PM 11/02/2022    9:46 AM 06/03/2022   10:22 AM 03/02/2022   11:01 AM  Fall Risk   Falls in the past year? 0 0 0 0 1  Number falls in past yr: 0 0 0 0 0  Injury with Fall? 0 0 0 0 0  Risk for fall due to : Impaired mobility No Fall Risks No Fall Risks No Fall Risks   Follow up Falls prevention discussed;Education provided;Falls evaluation completed  Falls evaluation completed Falls evaluation completed     MEDICARE RISK AT HOME: Medicare Risk at Home Any stairs in or around the home?: No If so, are there any  without handrails?: No Home free of loose throw rugs in walkways, pet beds, electrical cords, etc?: Yes Adequate lighting in your home to reduce risk of falls?: Yes Life alert?: No Grab bars in the bathroom?: Yes Shower chair or bench in shower?: No Elevated toilet seat or a handicapped toilet?: No  TIMED UP AND  GO:  Was the test performed? No    Cognitive Function:        03/01/2023    3:53 PM  6CIT Screen  What Year? 0 points  What month? 0 points  What time? 0 points  Count back from 20 0 points  Months in reverse 0 points  Repeat phrase 0 points  Total Score 0 points    Immunizations Immunization History  Administered Date(s) Administered   Pneumococcal Polysaccharide-23 05/22/2019   Tdap 05/22/2019    TDAP status: Up to date  Flu Vaccine status: Declined, Education has been provided regarding the importance of this vaccine but patient still declined. Advised may receive this vaccine at local pharmacy or Health Dept. Aware to provide a copy of the vaccination record if obtained from local pharmacy or Health Dept. Verbalized acceptance and understanding.  Pneumococcal vaccine status: Up to date  Covid-19 vaccine status: Information provided on how to obtain vaccines.   Qualifies for Shingles Vaccine? Yes   Zostavax completed No   Shingrix Completed?: No.    Education has been provided regarding the importance of this vaccine. Patient has been advised to call insurance company to determine out of pocket expense if they have not yet received this vaccine. Advised may also receive vaccine at local pharmacy or Health Dept. Verbalized acceptance and understanding.  Screening Tests Health Maintenance  Topic Date Due   FOOT EXAM  Never done   OPHTHALMOLOGY EXAM  Never done   HIV Screening  Never done   Zoster Vaccines- Shingrix (1 of 2) Never done   COLON CANCER SCREENING ANNUAL FOBT  03/31/2022   COVID-19 Vaccine (1 - 2023-24 season) Never done   Diabetic kidney evaluation - Urine ACR  03/03/2023   HEMOGLOBIN A1C  08/16/2023   Diabetic kidney evaluation - eGFR measurement  11/02/2023   MAMMOGRAM  12/09/2023   Medicare Annual Wellness (AWV)  02/29/2024   Colonoscopy  03/31/2024   Cervical Cancer Screening (HPV/Pap Cotest)  07/28/2024   DTaP/Tdap/Td (2 - Td or Tdap)  05/21/2029   Hepatitis C Screening  Completed   HPV VACCINES  Aged Out   INFLUENZA VACCINE  Discontinued    Health Maintenance  Health Maintenance Due  Topic Date Due   FOOT EXAM  Never done   OPHTHALMOLOGY EXAM  Never done   HIV Screening  Never done   Zoster Vaccines- Shingrix (1 of 2) Never done   COLON CANCER SCREENING ANNUAL FOBT  03/31/2022   COVID-19 Vaccine (1 - 2023-24 season) Never done   Diabetic kidney evaluation - Urine ACR  03/03/2023    Colorectal cancer screening: Type of screening: Colonoscopy. Completed 03/31/21. Repeat every 3 years  Mammogram status: Completed 12/08/21. Repeat every year  Lung Cancer Screening: (Low Dose CT Chest recommended if Age 44-80 years, 20 pack-year currently smoking OR have quit w/in 15years.) does not qualify.   Lung Cancer Screening Referral: n/a  Additional Screening:  Hepatitis C Screening: does qualify; Completed 07/29/19  Vision Screening: Recommended annual ophthalmology exams for early detection of glaucoma and other disorders of the eye. Is the patient up to date with their annual eye exam?  Yes  Who is the provider or what is the name of the office in which the patient attends annual eye exams? Dr. Arnetha Gula  If pt is not established with a provider, would they like to be referred to a provider to establish care? No .   Dental Screening: Recommended annual dental exams for proper oral hygiene  Diabetic Foot Exam: Diabetic Foot Exam: Overdue, Pt has been advised about the importance in completing this exam. Pt is scheduled for diabetic foot exam on at next office .  Community Resource Referral / Chronic Care Management: CRR required this visit?  No   CCM required this visit?  No     Plan:     I have personally reviewed and noted the following in the patient's chart:   Medical and social history Use of alcohol, tobacco or illicit drugs  Current medications and supplements including opioid prescriptions. Patient  is not currently taking opioid prescriptions. Functional ability and status Nutritional status Physical activity Advanced directives List of other physicians Hospitalizations, surgeries, and ER visits in previous 12 months Vitals Screenings to include cognitive, depression, and falls Referrals and appointments  In addition, I have reviewed and discussed with patient certain preventive protocols, quality metrics, and best practice recommendations. A written personalized care plan for preventive services as well as general preventive health recommendations were provided to patient.     Kandis Fantasia [place], [place]   [birth year]   After Visit Summary: (MyChart) Due to this being a telephonic visit, the after visit summary with patients personalized plan was offered to patient via MyChart   Nurse Notes: Patient is asking for orders for a cane to be placed and is also asking for status of referral to ENT.

## 2023-03-01 NOTE — Telephone Encounter (Signed)
Can call to schedule. Sent Referral to [facility] ph. # [phone] Fax# [phone] address [address]. They will contact the patient to schedule an appointmen   Will need video visit for cane request

## 2023-03-07 ENCOUNTER — Telehealth: Payer: Self-pay | Admitting: Orthopaedic Surgery

## 2023-03-07 MED ORDER — TRAMADOL HCL 50 MG PO TABS: 50.0000 mg | ORAL_TABLET | Freq: Every day | ORAL | 0 refills | Status: DC | PRN

## 2023-03-07 NOTE — Telephone Encounter (Signed)
Pt called regarding her pain in bil knees she's requesting pain medication.  Ann Foster [phone]

## 2023-03-07 NOTE — Telephone Encounter (Signed)
I sent tramadol

## 2023-03-07 NOTE — Addendum Note (Signed)
Addended by: Mayra Reel on: 03/07/2023 11:21 AM   Modules accepted: Orders

## 2023-03-08 NOTE — Telephone Encounter (Signed)
Patient identified by name and date of birth.  Patient given contact information for ENT and scheduled a video visit for request.

## 2023-03-10 ENCOUNTER — Telehealth: Payer: 59 | Admitting: Nurse Practitioner

## 2023-03-10 ENCOUNTER — Encounter: Payer: Self-pay | Admitting: Nurse Practitioner

## 2023-03-10 DIAGNOSIS — E1165 Type 2 diabetes mellitus with hyperglycemia: Secondary | ICD-10-CM

## 2023-03-10 DIAGNOSIS — G8929 Other chronic pain: Secondary | ICD-10-CM

## 2023-03-10 DIAGNOSIS — Z7985 Long-term (current) use of injectable non-insulin antidiabetic drugs: Secondary | ICD-10-CM

## 2023-03-10 DIAGNOSIS — M25561 Pain in right knee: Secondary | ICD-10-CM | POA: Diagnosis not present

## 2023-03-10 DIAGNOSIS — M25562 Pain in left knee: Secondary | ICD-10-CM

## 2023-03-10 MED ORDER — TRULICITY 1.5 MG/0.5ML ~~LOC~~ SOAJ: 1.5000 mg | SUBCUTANEOUS | 1 refills | Status: DC

## 2023-03-10 NOTE — Progress Notes (Signed)
Virtual Visit Note  I discussed the limitations, risks, security and privacy concerns of performing an evaluation and management service by video and the availability of in person appointments. I also discussed with the patient that there may be a patient responsible charge related to this service. The patient expressed understanding and agreed to proceed.    I connected with Madie Reno on 03/10/23  at  11:10 AM EDT  EDT by VIDEO and verified that I am speaking with the correct person using two identifiers.   Location of Patient: Private Residence   Location of Provider: [facility]    Persons participating in VIRTUAL visit: Bertram Denver FNP-BC Irais Mottram    History of Present Illness: VIRTUAL visit for: B/L KNEE OA  Ms. Avera has severe right knee degenerative joint disease secondary to osteoarthritis with bone-on-bone joint space narrowing.  She is currently being followed by orthopedics.  Due to the severity of her DJD she requires a cane to assist with mobility.  She also has osteoarthritis of the left knee however this is not as severe as the right. She is requesting a straight cane today.  Knee surgery is pending   Past Medical History:  Diagnosis Date   Ankle fracture 2016   Right   Aortic atherosclerosis (HCC)    trace calcific atherosclerosis aortic arch per ct neck done 12-22-17   Bronchitis    Diabetes mellitus without complication (HCC)    Hypertension    OA (osteoarthritis) of shoulder    left shoulder, both knees arthritis   Osteoarthritis, knee     Past Surgical History:  Procedure Laterality Date   COLONOSCOPY     NO PAST SURGERIES      Family History  Problem Relation Age of Onset   Breast cancer Mother    Colon cancer Father 53   Diabetes Son    Esophageal cancer Neg Hx    Stomach cancer Neg Hx    Liver disease Neg Hx    Pancreatic cancer Neg Hx    Rectal cancer Neg Hx     Social History    Socioeconomic History   Marital status: Single    Spouse name: Not on file   Number of children: 3   Years of education: Not on file   Highest education level: High school graduate  Occupational History   Not on file  Tobacco Use   Smoking status: Former    Current packs/day: 0.00    Types: Cigarettes    Quit date: 11/28/2019    Years since quitting: 3.2   Smokeless tobacco: Never   Tobacco comments:    smokes 1-2 cigarettes day now  Vaping Use   Vaping status: Never Used  Substance and Sexual Activity   Alcohol use: Not Currently    Comment: Occasional   Drug use: No   Sexual activity: Not Currently    Birth control/protection: Condom  Other Topics Concern   Not on file  Social History Narrative   Left Handed   One story home    Social Determinants of Health   Financial Resource Strain: Low Risk  (03/01/2023)   Overall Financial Resource Strain (CARDIA)    Difficulty of Paying Living Expenses: Not hard at all  Food Insecurity: No Food Insecurity (03/01/2023)   Hunger Vital Sign    Worried About Running Out of Food in the Last Year: Never true    Ran Out of Food in the Last Year: Never true  Transportation Needs: No Transportation Needs (03/01/2023)   PRAPARE - Administrator, Civil Service (Medical): No    Lack of Transportation (Non-Medical): No  Physical Activity: Inactive (03/01/2023)   Exercise Vital Sign    Days of Exercise per Week: 0 days    Minutes of Exercise per Session: 0 min  Stress: No Stress Concern Present (03/01/2023)   [employer] of Occupational Health - Occupational Stress Questionnaire    Feeling of Stress : Not at all  Social Connections: Moderately Isolated (03/01/2023)   Social Connection and Isolation Panel [NHANES]    Frequency of Communication with Friends and Family: More than three times a week    Frequency of Social Gatherings with Friends and Family: Three times a week    Attends Religious Services: 1 to 4 times per  year    Active Member of Clubs or Organizations: No    Attends Banker Meetings: Never    Marital Status: Never married     Observations/Objective: Awake, alert and oriented x 3   Review of Systems  Constitutional:  Negative for fever, malaise/fatigue and weight loss.  HENT: Negative.  Negative for nosebleeds.   Eyes: Negative.  Negative for blurred vision, double vision and photophobia.  Respiratory: Negative.  Negative for cough and shortness of breath.   Cardiovascular: Negative.  Negative for chest pain, palpitations and leg swelling.  Gastrointestinal: Negative.  Negative for heartburn, nausea and vomiting.  Musculoskeletal:  Positive for joint pain. Negative for myalgias.  Neurological: Negative.  Negative for dizziness, focal weakness, seizures and headaches.  Psychiatric/Behavioral: Negative.  Negative for suicidal ideas.     Assessment and Plan: Diagnoses and all orders for this visit:  Chronic pain of both knees -     For home use only DME Other see comment  Type 2 diabetes mellitus with hyperglycemia, unspecified whether long term insulin use (HCC) -     Dulaglutide (TRULICITY) 1.5 MG/0.5ML SOPN; Inject 1.5 mg into the skin once a week.     Follow Up Instructions Return if symptoms worsen or fail to improve.     I discussed the assessment and treatment plan with the patient. The patient was provided an opportunity to ask questions and all were answered. The patient agreed with the plan and demonstrated an understanding of the instructions.   The patient was advised to call back or seek an in-person evaluation if the symptoms worsen or if the condition fails to improve as anticipated.  I provided 12 minutes of face-to-face time during this encounter including median intraservice time, reviewing previous notes, labs, imaging, medications and explaining diagnosis and management.  Claiborne Rigg, FNP-BC

## 2023-03-17 ENCOUNTER — Telehealth: Payer: Self-pay | Admitting: Orthopaedic Surgery

## 2023-03-17 NOTE — Telephone Encounter (Signed)
Pt came in to drop off SS disability paperwork per Toniann Fail keep it and send it over to [employer] so we may send over records, pt is filling out her portion of paperwork to the best of her ability about her work history so we can send in medical, pt was seeing Dr. Roda Shutters for her knees, pt signed Medical records release also pt was advised being she was not seeing [place] in [id number] we only have 1 OV in our system for her and she may need to obtain records from her Prev Orth[facility] to help her case

## 2023-03-21 NOTE — Telephone Encounter (Signed)
It is not disability paperwork. It is a questionnaire for the patient to complete (not the provider). IC, advised patient of this. That is only for her to complete. I printed the ov note and placed with the form she originally dropped off, ready for patient to pick up. Patient expressed understanding.

## 2023-03-22 ENCOUNTER — Other Ambulatory Visit: Payer: Self-pay | Admitting: Family Medicine

## 2023-03-23 NOTE — Telephone Encounter (Signed)
Request is too soon for refill, last refill 12/28/22 for 90 and 1 refill.  Requested Prescriptions  Pending Prescriptions Disp Refills   FARXIGA 5 MG TABS tablet [Pharmacy Med Name: Farxiga 5 MG Oral Tablet] 100 tablet 2    Sig: TAKE 1 TABLET BY MOUTH ONCE  DAILY BEFORE BREAKFAST     Endocrinology:  Diabetes - SGLT2 Inhibitors Passed - 03/22/2023  6:00 AM      Passed - Cr in normal range and within 360 days    Creatinine, Ser  Date Value Ref Range Status  11/02/2022 0.81 0.57 - 1.00 mg/dL Final         Passed - HBA1C is between 0 and 7.9 and within 180 days    HbA1c, POC (controlled diabetic range)  Date Value Ref Range Status  02/16/2023 7.5 (A) 0.0 - 7.0 % Final         Passed - eGFR in normal range and within 360 days    GFR calc Af Amer  Date Value Ref Range Status  07/20/2020 91 >59 mL/min/1.73 Final    Comment:    **In accordance with recommendations from the NKF-ASN Task force,**   [facility] is in the process of updating its eGFR calculation to the   2021 CKD-EPI creatinine equation that estimates kidney function   without a race variable.    GFR calc non Af Amer  Date Value Ref Range Status  07/20/2020 79 >59 mL/min/1.73 Final   eGFR  Date Value Ref Range Status  11/02/2022 84 >59 mL/min/1.73 Final         Passed - Valid encounter within last 6 months    Recent Outpatient Visits           1 week ago Chronic pain of both knees   [place] [facility] [place], [place] W, NP   1 month ago Type 2 diabetes mellitus with hyperglycemia, unspecified whether long term insulin use [facility])   [place] [facility], [place], [place]   4 months ago Type 2 diabetes mellitus with hyperglycemia, unspecified whether long term insulin use [facility])   [place] [facility] [place], [place], [place]   9 months ago 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

## 2023-03-25 ENCOUNTER — Other Ambulatory Visit: Payer: Self-pay | Admitting: Family Medicine

## 2023-04-03 ENCOUNTER — Ambulatory Visit: Payer: Self-pay

## 2023-04-03 NOTE — Telephone Encounter (Signed)
      Chief Complaint: Abdominal pain Symptoms: Comes and goes. Worse with eating and movement Frequency: 2 days ago Pertinent Negatives: Patient denies  Disposition: [] ED /[] Urgent Care (no appt availability in office) / [x] Appointment(In office/virtual)/ []  [place] [facility]/ [] Home Care/ [] Refused Recommended Disposition /[] [facility]/ []  Follow-up with PCP Additional NoteAgrees with appointment, has to arrange transportation. Reason for Disposition  [1] MILD pain (e.g., does not interfere with normal activities) AND [2] pain comes and goes (cramps) AND [3] present > 48 hours  (Exception: This same abdominal pain is a chronic symptom recurrent or ongoing AND present > 4 weeks.)  Answer Assessment - Initial Assessment Questions 1. LOCATION: "Where does it hurt?"      Ribs 2. RADIATION: "Does the pain shoot anywhere else?" (e.g., chest, back)     Back 3. ONSET: "When did the pain begin?" (e.g., minutes, hours or days ago)      2 days ago 4. SUDDEN: "Gradual or sudden onset?"     gradual 5. PATTERN "Does the pain come and go, or is it constant?"    - If it comes and goes: "How long does it last?" "Do you have pain now?"     (Note: Comes and goes means the pain is intermittent. It goes away completely between bouts.)    - If constant: "Is it getting better, staying the same, or getting worse?"      (Note: Constant means the pain never goes away completely; most serious pain is constant and gets worse.)      Comes and goes 6. SEVERITY: "How bad is the pain?"  (e.g., Scale 1-10; mild, moderate, or severe)    - MILD (1-3): Doesn't interfere with normal activities, abdomen soft and not tender to touch.     - MODERATE (4-7): Interferes with normal activities or awakens from sleep, abdomen tender to touch.     - SEVERE (8-10): Excruciating pain, doubled over, unable to do any normal activities.       With eating and movements 7. RECURRENT SYMPTOM: "Have you ever had this  type of stomach pain before?" If Yes, ask: "When was the last time?" and "What happened that time?"      No 8. CAUSE: "What do you think is causing the stomach pain?"     Unsure 9. RELIEVING/AGGRAVATING FACTORS: "What makes it better or worse?" (e.g., antacids, bending or twisting motion, bowel movement)     Above 10. OTHER SYMPTOMS: "Do you have any other symptoms?" (e.g., back pain, diarrhea, fever, urination pain, vomiting)       Gas 11. PREGNANCY: "Is there any chance you are pregnant?" "When was your last menstrual period?"       No  Protocols used: Abdominal Pain - [facility]

## 2023-04-05 ENCOUNTER — Other Ambulatory Visit: Payer: Self-pay

## 2023-04-05 ENCOUNTER — Encounter: Payer: Self-pay | Admitting: Physician Assistant

## 2023-04-05 ENCOUNTER — Ambulatory Visit: Payer: 59 | Attending: Physician Assistant | Admitting: Physician Assistant

## 2023-04-05 ENCOUNTER — Emergency Department (HOSPITAL_BASED_OUTPATIENT_CLINIC_OR_DEPARTMENT_OTHER)
Admission: EM | Admit: 2023-04-05 | Discharge: 2023-04-06 | Disposition: A | Discharge status: Home / Self Care | Payer: 59 | Attending: Emergency Medicine | Admitting: Emergency Medicine

## 2023-04-05 ENCOUNTER — Encounter (HOSPITAL_BASED_OUTPATIENT_CLINIC_OR_DEPARTMENT_OTHER): Payer: Self-pay

## 2023-04-05 VITALS — BP 142/82 | HR 80 | Wt 195.4 lb

## 2023-04-05 DIAGNOSIS — E113212 Type 2 diabetes mellitus with mild nonproliferative diabetic retinopathy with macular edema, left eye: Secondary | ICD-10-CM

## 2023-04-05 DIAGNOSIS — Z7984 Long term (current) use of oral hypoglycemic drugs: Secondary | ICD-10-CM | POA: Insufficient documentation

## 2023-04-05 DIAGNOSIS — I10 Essential (primary) hypertension: Secondary | ICD-10-CM | POA: Insufficient documentation

## 2023-04-05 DIAGNOSIS — E119 Type 2 diabetes mellitus without complications: Secondary | ICD-10-CM | POA: Insufficient documentation

## 2023-04-05 DIAGNOSIS — Z794 Long term (current) use of insulin: Secondary | ICD-10-CM | POA: Insufficient documentation

## 2023-04-05 DIAGNOSIS — Z79899 Other long term (current) drug therapy: Secondary | ICD-10-CM | POA: Diagnosis not present

## 2023-04-05 DIAGNOSIS — R1010 Upper abdominal pain, unspecified: Secondary | ICD-10-CM

## 2023-04-05 DIAGNOSIS — K8020 Calculus of gallbladder without cholecystitis without obstruction: Secondary | ICD-10-CM | POA: Diagnosis not present

## 2023-04-05 DIAGNOSIS — R109 Unspecified abdominal pain: Secondary | ICD-10-CM | POA: Diagnosis present

## 2023-04-05 LAB — POCT URINALYSIS DIP (CLINITEK)
Bilirubin, UA: NEGATIVE
Glucose, UA: 500 mg/dL — AB
Ketones, POC UA: NEGATIVE mg/dL
Leukocytes, UA: NEGATIVE
Nitrite, UA: NEGATIVE
POC PROTEIN,UA: NEGATIVE
Spec Grav, UA: 1.015 (ref 1.010–1.025)
Urobilinogen, UA: 1 U/dL
pH, UA: 5.5 (ref 5.0–8.0)

## 2023-04-05 LAB — GLUCOSE, POCT (MANUAL RESULT ENTRY): POC Glucose: 195 mg/dL — AB (ref 70–99)

## 2023-04-05 NOTE — ED Triage Notes (Signed)
Pt reports upper abdominal pain x 2 days associated with belching and nausea. Denies chest pain or shortness of breath. Denies diarrhea.

## 2023-04-05 NOTE — ED Notes (Signed)
Pt reports she has been at [facility] in [place] all day in their waiting room but ended up leaving. She had blood work done there and would like to defer blood work here at this time.

## 2023-04-05 NOTE — Progress Notes (Signed)
Patient ID: Ann Foster, female   DOB: 10-11-1965, 57 y.o.   MRN: [id number]   Ann Foster, is a 57 y.o. female  [id number]  [id number]  DOB - 12/18/1965  Chief Complaint  Patient presents with   Abdominal Pain    Patient state that the pain is in the upper abdomin and radiates to the back and shoulders. The pain is worse when eating or drinking          Subjective:   Ann Foster is a 57 y.o. female here today for upper abdominal pain that has been worsening since Monday.  Some nausea.  Pain worsens with food or water.  Cramping pain that is 9/10 when it occurs.  Lasts about 15 mins.  Occurring a few times an hour.  BMs have decreased from 2 to 1 a day and seem smaller than usual.  No urinary or vaginal symptoms.  Blood sugars run about 200 on average.  She has not had pain like this.  No diarrhea/melena/hematochezia.  She does still have her gallbladder.  Almost went to ED yesterday but waited for appt today.  No SOB or radiating pain.    No problems updated.  ALLERGIES: No Known Allergies  PAST MEDICAL HISTORY: Past Medical History:  Diagnosis Date   Ankle fracture 2016   Right   Aortic atherosclerosis (HCC)    trace calcific atherosclerosis aortic arch per ct neck done 12-22-17   Bronchitis    Diabetes mellitus without complication (HCC)    Hypertension    OA (osteoarthritis) of shoulder    left shoulder, both knees arthritis   Osteoarthritis, knee     MEDICATIONS AT HOME: Prior to Admission medications   Medication Sig Start Date End Date Taking? Authorizing Provider  Accu-Chek Softclix Lancets lancets Check blood glucose level by fingerstick once per day. E11.65 05/07/21  Yes Hoy Register, MD  albuterol (VENTOLIN HFA) 108 (90 Base) MCG/ACT inhaler Inhale 2 puffs into the lungs every 6 (six) hours as needed for wheezing or shortness of breath. 11/02/22  Yes Anders Simmonds, PA-C  atorvastatin (LIPITOR) 80 MG tablet Take 1 tablet (80 mg total) by mouth  daily. 02/16/23  Yes Georgian Co M, PA-C  Blood Glucose Monitoring Suppl (ACCU-CHEK GUIDE) w/Device KIT Check blood glucose level by fingerstick once per day. E11.65 05/07/21  Yes Hoy Register, MD  buPROPion (WELLBUTRIN SR) 150 MG 12 hr tablet Take 1 tablet (150 mg total) by mouth 2 (two) times daily. 02/17/23  Yes Lorriane Shire, MD  cyclobenzaprine (FLEXERIL) 5 MG tablet Take 1 tablet (5 mg total) by mouth at bedtime as needed for muscle spasms. 03/02/22  Yes Claiborne Rigg, NP  cyclopentolate (CYCLODRYL,CYCLOGYL) 1 % ophthalmic solution  01/30/21  Yes [provider]  dapagliflozin propanediol (FARXIGA) 5 MG TABS tablet TAKE 1 TABLET BY MOUTH ONCE  DAILY BEFORE BREAKFAST 03/27/23  Yes Newlin, Enobong, MD  Dulaglutide (TRULICITY) 1.5 MG/0.5ML SOPN Inject 1.5 mg into the skin once a week. 03/10/23  Yes Claiborne Rigg, NP  estradiol (ESTRACE) 0.1 MG/GM vaginal cream Apply 1 gram per vagina every night for 2 weeks, then apply three times a week 02/17/23  Yes Ajewole, Christana, MD  fluticasone (FLONASE) 50 MCG/ACT nasal spray Place 1 spray into both nostrils 2 (two) times daily. 03/02/22  Yes Claiborne Rigg, NP  glucose blood (ACCU-CHEK GUIDE) test strip Check blood glucose level by fingerstick once per day. E11.65 02/16/23  Yes Naven Giambalvo, Marylene Land M, PA-C  montelukast (SINGULAIR) 10  MG tablet Take 1 tablet (10 mg total) by mouth at bedtime. 02/16/23  Yes Yehonatan Grandison, Marylene Land M, PA-C  nortriptyline (PAMELOR) 10 MG capsule TAKE 1 CAPSULE BY MOUTH EVERY NIGHT AT BEDTIME FOR 2 WEEKS THEN INCREASE TO 2 CAPSULES EVERY NIGHT AT BEDTIME 02/16/23 02/16/24 Yes Kearia Yin M, PA-C  omeprazole (PRILOSEC) 40 MG capsule Take 1 capsule (40 mg total) by mouth daily. 11/29/21  Yes Claiborne Rigg, NP  Prasterone Andreas [place]) 6.5 MG INST Insert one capsule vaginally 10/24/19  Yes Lesly Dukes, MD  PRED MILD 0.12 % ophthalmic suspension Place 1 drop into the left eye 4 (four) times daily. 02/10/21  Yes [provider]  prednisoLONE acetate (PRED FORTE) 1 % ophthalmic suspension Place 1 drop into the left eye 4 (four) times daily. 01/30/21  Yes [provider]  promethazine-dextromethorphan (PROMETHAZINE-DM) 6.25-15 MG/5ML syrup Take 5 mLs by mouth 4 (four) times daily as needed for cough. 06/03/22  Yes Claiborne Rigg, NP  ramipril (ALTACE) 2.5 MG capsule TAKE 1 CAPSULE(2.5 MG) BY MOUTH DAILY 01/17/23  Yes Newlin, Enobong, MD  traMADol (ULTRAM) 50 MG tablet Take 1-2 tablets (50-100 mg total) by mouth daily as needed. 03/07/23  Yes Tarry Kos, MD  estradiol (VIVELLE-DOT) 0.0375 MG/24HR PLACE 1 PATCH ONTO THE SKIN 2 (TWO) TIMES A WEEK. 04/22/20 04/22/21  Lesly Dukes, MD  progesterone (PROMETRIUM) 200 MG capsule TAKE 1 CAPSULE (200 MG TOTAL) BY MOUTH DAILY. 04/22/20 04/22/21  Lesly Dukes, MD  lisinopril (ZESTRIL) 2.5 MG tablet Take 1 tablet (2.5 mg total) by mouth 2 (two) times daily. 09/12/19 04/22/20  Hoy Register, MD    ROS: Neg HEENT Neg resp Neg cardiac Neg GU Neg MS Neg psych Neg neuro  Objective:   Vitals:   04/05/23 1515  BP: (!) 142/82  Pulse: 80  SpO2: 96%  Weight: 195 lb 6.4 oz (88.6 kg)   Exam General appearance : Awake, alert, not in any distress. Speech Clear. Not toxic looking.   HEENT: Atraumatic and Normocephalic Neck: Supple, no JVD. No cervical lymphadenopathy.  Chest: Good air entry bilaterally, CTAB.  No rales/rhonchi/wheezing CVS: S1 S2 regular, no murmurs.  Abdomen: Bowel sounds ABSENT, very tender in upper abdomen B L>R tender and mildly distended with + gaurding, no rigidity or no rebound. No lower abdominal tenderness Extremities: B/L Lower Ext shows no edema, both legs are warm to touch Neurology: Awake alert, and oriented X 3, CN II-XII intact, Non focal Skin: No Rash  Data Review Lab Results  Component Value Date   HGBA1C 7.5 (A) 02/16/2023   HGBA1C 7.8 (A) 11/02/2022   HGBA1C 7.6 (H) 06/03/2022    Assessment & Plan   1.  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   - Glucose (CBG)  2. Pain of upper abdomen Concern for Ddx including but not limited to-SBO, gastroparesis, cholecystitis, pancreatitis To ED now.  Patient verbalizes understanding.  Her daughter is going to meet her there.   - POCT URINALYSIS DIP (CLINITEK) -currently patient not toxic.  Concern she could worsen quickly.    To ED now.   Return if symptoms worsen or fail to improve.  The patient was given clear instructions to go to ER or return to medical center if symptoms don't improve, worsen or new problems develop. The patient verbalized understanding. The patient was told to call to get lab results  if they haven't heard anything in the next week.      Georgian Co, PA-C [facility] [place], [place] [phone]   04/05/2023, 3:50 PM

## 2023-04-05 NOTE — Patient Instructions (Signed)
Please go to the nearest emergency department with your abdominal pain!!!!

## 2023-04-06 ENCOUNTER — Emergency Department (HOSPITAL_BASED_OUTPATIENT_CLINIC_OR_DEPARTMENT_OTHER): Payer: 59

## 2023-04-06 DIAGNOSIS — K8020 Calculus of gallbladder without cholecystitis without obstruction: Secondary | ICD-10-CM | POA: Diagnosis not present

## 2023-04-06 MED ORDER — ONDANSETRON HCL 4 MG/2ML IJ SOLN
4.0000 mg | Freq: Once | INTRAMUSCULAR | Status: AC
  Administered 2023-04-06: 4 mg via INTRAVENOUS
  Filled 2023-04-06: qty 2

## 2023-04-06 MED ORDER — HYDROCODONE-ACETAMINOPHEN 5-325 MG PO TABS: 1.0000 | ORAL_TABLET | Freq: Four times a day (QID) | ORAL | 0 refills | Status: DC | PRN

## 2023-04-06 MED ORDER — FENTANYL CITRATE PF 50 MCG/ML IJ SOSY
50.0000 ug | PREFILLED_SYRINGE | Freq: Once | INTRAMUSCULAR | Status: AC
  Administered 2023-04-06: 50 ug via INTRAVENOUS
  Filled 2023-04-06: qty 1

## 2023-04-06 MED ORDER — ONDANSETRON 4 MG PO TBDP: 4.0000 mg | ORAL_TABLET | Freq: Three times a day (TID) | ORAL | 0 refills | Status: DC | PRN

## 2023-04-06 MED ORDER — IOHEXOL 300 MG/ML  SOLN
100.0000 mL | Freq: Once | INTRAMUSCULAR | Status: AC | PRN
  Administered 2023-04-06: 100 mL via INTRAVENOUS

## 2023-04-06 MED ORDER — PANTOPRAZOLE SODIUM 40 MG IV SOLR
40.0000 mg | Freq: Once | INTRAVENOUS | Status: AC
  Administered 2023-04-06: 40 mg via INTRAVENOUS
  Filled 2023-04-06: qty 10

## 2023-04-06 NOTE — ED Provider Notes (Signed)
[facility] Provider Note   CSN: [id number] Arrival date & time: 04/05/23  2200     History  Chief Complaint  Patient presents with   Abdominal Pain    Ann Foster is a 57 y.o. female.  The history is provided by the patient.  Patient history of diabetes and hypertension presents for abdominal pain.  She reports she has had upper abdominal pain for at least 2 days.  She reports cramping abdominal pain and frequent belching.  She has had nausea but no vomiting.  No diarrhea, reports mild constipation.  No chest pain or shortness of breath.  Denies any abdominal surgeries   No excessive NSAID use.  No excessive alcohol use  She was seen by her PCP, then went to an outside ER but due to wait times came here. Past Medical History:  Diagnosis Date   Ankle fracture 2016   Right   Aortic atherosclerosis (HCC)    trace calcific atherosclerosis aortic arch per ct neck done 12-22-17   Bronchitis    Diabetes mellitus without complication (HCC)    Hypertension    OA (osteoarthritis) of shoulder    left shoulder, both knees arthritis   Osteoarthritis, knee     Home Medications Prior to Admission medications   Medication Sig Start Date End Date Taking? Authorizing Provider  HYDROcodone-acetaminophen (NORCO/VICODIN) 5-325 MG tablet Take 1 tablet by mouth every 6 (six) hours as needed. 04/06/23  Yes Zadie Rhine, MD  ondansetron (ZOFRAN-ODT) 4 MG disintegrating tablet Take 1 tablet (4 mg total) by mouth every 8 (eight) hours as needed for vomiting or nausea. 04/06/23  Yes Zadie Rhine, MD  Accu-Chek Softclix Lancets lancets Check blood glucose level by fingerstick once per day. E11.65 05/07/21   Hoy Register, MD  albuterol (VENTOLIN HFA) 108 (90 Base) MCG/ACT inhaler Inhale 2 puffs into the lungs every 6 (six) hours as needed for wheezing or shortness of breath. 11/02/22   Anders Simmonds, PA-C  atorvastatin (LIPITOR) 80 MG tablet  Take 1 tablet (80 mg total) by mouth daily. 02/16/23   Anders Simmonds, PA-C  Blood Glucose Monitoring Suppl (ACCU-CHEK GUIDE) w/Device KIT Check blood glucose level by fingerstick once per day. E11.65 05/07/21   Hoy Register, MD  buPROPion (WELLBUTRIN SR) 150 MG 12 hr tablet Take 1 tablet (150 mg total) by mouth 2 (two) times daily. 02/17/23   Lorriane Shire, MD  cyclobenzaprine (FLEXERIL) 5 MG tablet Take 1 tablet (5 mg total) by mouth at bedtime as needed for muscle spasms. 03/02/22   Claiborne Rigg, NP  cyclopentolate (CYCLODRYL,CYCLOGYL) 1 % ophthalmic solution  01/30/21   [provider]  dapagliflozin propanediol (FARXIGA) 5 MG TABS tablet TAKE 1 TABLET BY MOUTH ONCE  DAILY BEFORE BREAKFAST 03/27/23   Hoy Register, MD  Dulaglutide (TRULICITY) 1.5 MG/0.5ML SOPN Inject 1.5 mg into the skin once a week. 03/10/23   Claiborne Rigg, NP  estradiol (ESTRACE) 0.1 MG/GM vaginal cream Apply 1 gram per vagina every night for 2 weeks, then apply three times a week 02/17/23   Lorriane Shire, MD  estradiol (VIVELLE-DOT) 0.0375 MG/24HR PLACE 1 PATCH ONTO THE SKIN 2 (TWO) TIMES A WEEK. 04/22/20 04/22/21  Lesly Dukes, MD  fluticasone (FLONASE) 50 MCG/ACT nasal spray Place 1 spray into both nostrils 2 (two) times daily. 03/02/22   Claiborne Rigg, NP  glucose blood (ACCU-CHEK GUIDE) test strip Check blood glucose level by fingerstick once per day. E11.65  02/16/23   Anders Simmonds, PA-C  montelukast (SINGULAIR) 10 MG tablet Take 1 tablet (10 mg total) by mouth at bedtime. 02/16/23   Anders Simmonds, PA-C  nortriptyline (PAMELOR) 10 MG capsule TAKE 1 CAPSULE BY MOUTH EVERY NIGHT AT BEDTIME FOR 2 WEEKS THEN INCREASE TO 2 CAPSULES EVERY NIGHT AT BEDTIME 02/16/23 02/16/24  Anders Simmonds, PA-C  omeprazole (PRILOSEC) 40 MG capsule Take 1 capsule (40 mg total) by mouth daily. 11/29/21   Claiborne Rigg, NP  Prasterone [place]) 6.5 MG INST Insert one capsule vaginally 10/24/19   Lesly Dukes, MD  PRED MILD 0.12 % ophthalmic suspension Place 1 drop into the left eye 4 (four) times daily. 02/10/21   [provider]  prednisoLONE acetate (PRED FORTE) 1 % ophthalmic suspension Place 1 drop into the left eye 4 (four) times daily. 01/30/21   [provider]  progesterone (PROMETRIUM) 200 MG capsule TAKE 1 CAPSULE (200 MG TOTAL) BY MOUTH DAILY. 04/22/20 04/22/21  Lesly Dukes, MD  promethazine-dextromethorphan (PROMETHAZINE-DM) 6.25-15 MG/5ML syrup Take 5 mLs by mouth 4 (four) times daily as needed for cough. 06/03/22   Claiborne Rigg, NP  ramipril (ALTACE) 2.5 MG capsule TAKE 1 CAPSULE(2.5 MG) BY MOUTH DAILY 01/17/23   Hoy Register, MD  lisinopril (ZESTRIL) 2.5 MG tablet Take 1 tablet (2.5 mg total) by mouth 2 (two) times daily. 09/12/19 04/22/20  Hoy Register, MD      Allergies    Patient has no known allergies.    Review of Systems   Review of Systems  Constitutional:  Negative for fever.  Respiratory:  Negative for shortness of breath.   Cardiovascular:  Negative for chest pain.  Gastrointestinal:  Positive for abdominal pain and nausea.    Physical Exam Updated Vital Signs BP (!) 153/75 (BP Location: Left Arm)   Pulse 71   Temp 97.8 F (36.6 C)   Resp 20   Ht 1.6 m (5\' 3" )   Wt 88.5 kg   SpO2 100%   BMI 34.54 kg/m  Physical Exam CONSTITUTIONAL: Well developed/well nourished HEAD: Normocephalic/atraumatic EYES: EOMI/PERRL, no icterus ENMT: Mucous membranes moist NECK: supple no meningeal signs CV: S1/S2 noted, no murmurs/rubs/gallops noted LUNGS: Lungs are clear to auscultation bilaterally, no apparent distress ABDOMEN: soft, moderate RUQ and epigastric tenderness, no rebound or guarding, bowel sounds noted throughout abdomen NEURO: Pt is awake/alert/appropriate, moves all extremitiesx4.  No facial droop.   SKIN: warm, color normal PSYCH: no abnormalities of mood noted, alert and oriented to situation  ED Results / Procedures /  Treatments   Labs (all labs ordered are listed, but only abnormal results are displayed) Labs Reviewed - No data to display  EKG EKG Interpretation Date/Time:  Wednesday April 05 2023 22:11:53 EST Ventricular Rate:  65 PR Interval:  214 QRS Duration:  108 QT Interval:  390 QTC Calculation: 405 R Axis:   49  Text Interpretation: Sinus rhythm with 1st degree A-V block Abnormal ECG When compared with ECG of 18-Sep-2017 19:24, PREVIOUS ECG IS PRESENT Confirmed by Zadie Rhine ([id number]) on 04/06/2023 12:06:30 AM  Radiology CT ABDOMEN PELVIS W CONTRAST  Result Date: 04/06/2023 CLINICAL DATA:  Upper abdominal pain, nausea EXAM: CT ABDOMEN AND PELVIS WITH CONTRAST TECHNIQUE: Multidetector CT imaging of the abdomen and pelvis was performed using the standard protocol following bolus administration of intravenous contrast. RADIATION DOSE REDUCTION: This exam was performed according to the departmental dose-optimization program which includes automated exposure control, adjustment of the mA and/or  kV according to patient size and/or use of iterative reconstruction technique. CONTRAST:  OMNIPAQUE IOHEXOL 300 MG/ML  SOLN COMPARISON:  None Available. FINDINGS: Lower chest: No acute abnormality Hepatobiliary: 1.5 cm gallstone within the gallbladder. No wall thickening or biliary ductal dilatation. No focal hepatic abnormality. Pancreas: No focal abnormality or ductal dilatation. Small duodenal diverticula in the region of the pancreatic head. Spleen: No focal abnormality.  Normal size. Adrenals/Urinary Tract: Adrenal glands normal. 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 No renal mass, stones or hydronephrosis. Urinary bladder unremarkable. Stomach/Bowel: Normal appendix. Stomach, large and small bowel grossly unremarkable. Vascular/Lymphatic: No evidence of aneurysm or adenopathy. Reproductive: Uterus and adnexa unremarkable.  No mass. Other: No free fluid or free air. Musculoskeletal: No  acute bony abnormality. IMPRESSION: Cholelithiasis.  No CT evidence of acute cholecystitis. 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  No hydronephrosis. No acute findings. Electronically Signed   By: Charlett Nose M.D.   On: 04/06/2023 01:01    Procedures Procedures    Medications Ordered in ED Medications  fentaNYL (SUBLIMAZE) injection 50 mcg (50 mcg Intravenous Given 04/06/23 0025)  ondansetron (ZOFRAN) injection 4 mg (4 mg Intravenous Given 04/06/23 0024)  pantoprazole (PROTONIX) injection 40 mg (40 mg Intravenous Given 04/06/23 0021)  iohexol (OMNIPAQUE) 300 MG/ML solution 100 mL (100 mLs Intravenous Contrast Given 04/06/23 0034)    ED Course/ Medical Decision Making/ A&P Clinical Course as of 04/06/23 0127  Thu Apr 06, 2023  0126 Patient presented for upper abdominal pain nausea and belching.  Labs from outside facility reviewed and overall unremarkable.  CT imaging confirms cholelithiasis but no complicating features. Patient is feeling improved and requesting discharge home.  Discussed need for follow-up as an outpatient.  We discussed strict return precautions.  Short course of pain medicines provided.  Patient also informed of ectopic kidney incidental finding [DW]    Clinical Course User Index [DW] Zadie Rhine, MD                                 Medical Decision Making Amount and/or Complexity of Data Reviewed Radiology: ordered.  Risk Prescription drug management.   This patient presents to the ED for concern of abdominal pain, this involves an extensive number of treatment options, and is a complaint that carries with it a high risk of complications and morbidity.  The differential diagnosis includes but is not limited to cholecystitis, cholelithiasis, pancreatitis, gastritis, peptic ulcer disease, appendicitis, bowel obstruction, bowel perforation, diverticulitis, AAA, ischemic bowel, acute coronary syndrome    Comorbidities that complicate the patient  evaluation: Patient's presentation is complicated by their history of diabetes and hypertension   Additional history obtained: Records reviewed Care Everywhere/External Records labs reviewed from other ER visit, no acute findings  Imaging Studies ordered: I ordered imaging studies including CT scan abdomen pelvis   I independently visualized and interpreted imaging which showed cholelithiasis I agree with the radiologist interpretation  Cardiac Monitoring: The patient was maintained on a cardiac monitor.  I personally viewed and interpreted the cardiac monitor which showed an underlying rhythm of:  sinus rhythm  Medicines ordered and prescription drug management: I ordered medication including fentanyl & Zofran for pain and nausea Reevaluation of the patient after these medicines showed that the patient    improved  Reevaluation: After the interventions noted above, I reevaluated the patient and found that they have :improved  Complexity of problems addressed: Patient's  presentation is most consistent with  acute presentation with potential threat to life or bodily function  Disposition: After consideration of the diagnostic results and the patient's response to treatment,  I feel that the patent would benefit from discharge   .           Final Clinical Impression(s) / ED Diagnoses Final diagnoses:  Calculus of gallbladder without cholecystitis without obstruction    Rx / DC Orders ED Discharge Orders          Ordered    ondansetron (ZOFRAN-ODT) 4 MG disintegrating tablet  Every 8 hours PRN        04/06/23 0126    HYDROcodone-acetaminophen (NORCO/VICODIN) 5-325 MG tablet  Every 6 hours PRN        04/06/23 0126              Zadie Rhine, MD 04/06/23 0127

## 2023-04-13 ENCOUNTER — Observation Stay (HOSPITAL_COMMUNITY): Payer: 59

## 2023-04-13 ENCOUNTER — Observation Stay (HOSPITAL_COMMUNITY)
Admission: AD | Admit: 2023-04-13 | Discharge: 2023-04-16 | Disposition: A | Discharge status: Home / Self Care | Payer: 59 | Source: Ambulatory Visit | Attending: General Surgery | Admitting: General Surgery

## 2023-04-13 ENCOUNTER — Encounter (HOSPITAL_COMMUNITY): Payer: Self-pay

## 2023-04-13 DIAGNOSIS — K8012 Calculus of gallbladder with acute and chronic cholecystitis without obstruction: Secondary | ICD-10-CM | POA: Diagnosis present

## 2023-04-13 DIAGNOSIS — Z79899 Other long term (current) drug therapy: Secondary | ICD-10-CM | POA: Diagnosis not present

## 2023-04-13 DIAGNOSIS — K810 Acute cholecystitis: Principal | ICD-10-CM | POA: Diagnosis present

## 2023-04-13 DIAGNOSIS — E119 Type 2 diabetes mellitus without complications: Secondary | ICD-10-CM | POA: Insufficient documentation

## 2023-04-13 DIAGNOSIS — Z87891 Personal history of nicotine dependence: Secondary | ICD-10-CM | POA: Diagnosis not present

## 2023-04-13 DIAGNOSIS — I10 Essential (primary) hypertension: Secondary | ICD-10-CM | POA: Diagnosis not present

## 2023-04-13 LAB — COMPREHENSIVE METABOLIC PANEL
ALT: 36 U/L (ref 0–44)
AST: 17 U/L (ref 15–41)
Albumin: 4.1 g/dL (ref 3.5–5.0)
Alkaline Phosphatase: 69 U/L (ref 38–126)
Anion gap: 8 (ref 5–15)
BUN: 19 mg/dL (ref 6–20)
CO2: 23 mmol/L (ref 22–32)
Calcium: 9.6 mg/dL (ref 8.9–10.3)
Chloride: 107 mmol/L (ref 98–111)
Creatinine, Ser: 1.06 mg/dL — ABNORMAL HIGH (ref 0.44–1.00)
GFR, Estimated: >60 mL/min (ref >60)
Glucose, Bld: 193 mg/dL — ABNORMAL HIGH (ref 70–99)
Potassium: 4.6 mmol/L (ref 3.5–5.1)
Sodium: 138 mmol/L (ref 135–145)
Total Bilirubin: 0.4 mg/dL (ref <1.2)
Total Protein: 7.7 g/dL (ref 6.5–8.1)

## 2023-04-13 LAB — CBC WITH DIFFERENTIAL/PLATELET
Abs Immature Granulocytes: 0.02 10*3/uL (ref 0.00–0.07)
Basophils Absolute: 0 10*3/uL (ref 0.0–0.1)
Basophils Relative: 1 %
Eosinophils Absolute: 0.2 10*3/uL (ref 0.0–0.5)
Eosinophils Relative: 2 %
HCT: 39.4 % (ref 36.0–46.0)
Hemoglobin: 13 g/dL (ref 12.0–15.0)
Immature Granulocytes: 0 %
Lymphocytes Relative: 40 %
Lymphs Abs: 3.6 10*3/uL (ref 0.7–4.0)
MCH: 32.2 pg (ref 26.0–34.0)
MCHC: 33 g/dL (ref 30.0–36.0)
MCV: 97.5 fL (ref 80.0–100.0)
Monocytes Absolute: 0.7 10*3/uL (ref 0.1–1.0)
Monocytes Relative: 8 %
Neutro Abs: 4.4 10*3/uL (ref 1.7–7.7)
Neutrophils Relative %: 49 %
Platelets: 310 10*3/uL (ref 150–400)
RBC: 4.04 MIL/uL (ref 3.87–5.11)
RDW: 12.6 % (ref 11.5–15.5)
WBC: 8.9 10*3/uL (ref 4.0–10.5)
nRBC: 0 % (ref 0.0–0.2)

## 2023-04-13 LAB — GLUCOSE, CAPILLARY
Glucose-Capillary: 255 mg/dL — ABNORMAL HIGH (ref 70–99)
Glucose-Capillary: 146 mg/dL — ABNORMAL HIGH (ref 70–99)

## 2023-04-13 MED ORDER — PANTOPRAZOLE SODIUM 40 MG IV SOLR
40.0000 mg | Freq: Every day | INTRAVENOUS | Status: DC
  Administered 2023-04-13 – 2023-04-14 (×2): 40 mg via INTRAVENOUS
  Filled 2023-04-13 (×2): qty 10

## 2023-04-13 MED ORDER — ONDANSETRON HCL 4 MG/2ML IJ SOLN
4.0000 mg | Freq: Four times a day (QID) | INTRAMUSCULAR | Status: DC | PRN
  Administered 2023-04-15: 4 mg via INTRAVENOUS
  Filled 2023-04-13: qty 2

## 2023-04-13 MED ORDER — ONDANSETRON 4 MG PO TBDP: 4.0000 mg | ORAL_TABLET | Freq: Four times a day (QID) | ORAL | Status: DC | PRN

## 2023-04-13 MED ORDER — DIPHENHYDRAMINE HCL 12.5 MG/5ML PO ELIX: 12.5000 mg | ORAL_SOLUTION | Freq: Four times a day (QID) | ORAL | Status: DC | PRN

## 2023-04-13 MED ORDER — OXYCODONE HCL 5 MG PO TABS
5.0000 mg | ORAL_TABLET | ORAL | Status: DC | PRN
Start: 2023-04-13 — End: 2023-04-16
  Administered 2023-04-13 – 2023-04-16 (×3): 10 mg via ORAL
  Filled 2023-04-13 (×3): qty 2

## 2023-04-13 MED ORDER — ENOXAPARIN SODIUM 40 MG/0.4ML IJ SOSY
40.0000 mg | PREFILLED_SYRINGE | INTRAMUSCULAR | Status: DC
  Administered 2023-04-13 – 2023-04-15 (×3): 40 mg via SUBCUTANEOUS
  Filled 2023-04-13 (×3): qty 0.4

## 2023-04-13 MED ORDER — MELATONIN 3 MG PO TABS: 3.0000 mg | ORAL_TABLET | Freq: Every evening | ORAL | Status: DC | PRN

## 2023-04-13 MED ORDER — ACETAMINOPHEN 500 MG PO TABS
1000.0000 mg | ORAL_TABLET | Freq: Four times a day (QID) | ORAL | Status: DC
  Administered 2023-04-13 – 2023-04-16 (×8): 1000 mg via ORAL
  Filled 2023-04-13 (×8): qty 2

## 2023-04-13 MED ORDER — POTASSIUM CHLORIDE IN NACL 20-0.9 MEQ/L-% IV SOLN
INTRAVENOUS | Status: AC
  Filled 2023-04-13 (×3): qty 1000

## 2023-04-13 MED ORDER — SODIUM CHLORIDE 0.9 % IV SOLN
2.0000 g | INTRAVENOUS | Status: DC
  Administered 2023-04-13 – 2023-04-14 (×2): 2 g via INTRAVENOUS
  Filled 2023-04-13 (×3): qty 20

## 2023-04-13 MED ORDER — MORPHINE SULFATE (PF) 2 MG/ML IV SOLN
1.0000 mg | INTRAVENOUS | Status: DC | PRN
Start: 2023-04-13 — End: 2023-04-16

## 2023-04-13 MED ORDER — INSULIN ASPART 100 UNIT/ML IJ SOLN
0.0000 [IU] | INTRAMUSCULAR | Status: DC
  Administered 2023-04-13: 2 [IU] via SUBCUTANEOUS
  Administered 2023-04-13: 8 [IU] via SUBCUTANEOUS
  Administered 2023-04-14 (×4): 3 [IU] via SUBCUTANEOUS
  Administered 2023-04-14: 2 [IU] via SUBCUTANEOUS
  Administered 2023-04-15: 5 [IU] via SUBCUTANEOUS
  Administered 2023-04-15 (×4): 3 [IU] via SUBCUTANEOUS
  Administered 2023-04-16: 5 [IU] via SUBCUTANEOUS
  Administered 2023-04-16: 2 [IU] via SUBCUTANEOUS
  Administered 2023-04-16: 3 [IU] via SUBCUTANEOUS

## 2023-04-13 MED ORDER — DIPHENHYDRAMINE HCL 50 MG/ML IJ SOLN: 12.5000 mg | Freq: Four times a day (QID) | INTRAMUSCULAR | Status: DC | PRN

## 2023-04-13 NOTE — H&P (Signed)
Admission Note  Ann Foster 1965/12/07  [id number].     Chief Complaint/Reason for Consult: epigastric abdominal pain  HPI:  Patient is a 57 year old female direct admitted from the office today with 7 day hx of epigastric abdominal pain radiating across upper abdomen and to shoulder blades. She reports pain has been intermittent and gradually worsening. She was seen in the ED and directed to surgical office for evaluation for cholecystectomy but in the office today Dr. Dossie Der was concerned for cholecystitis. Patient denies fever, chills. Associated nausea but no vomiting. Pain exacerbated by eating. No prior abdominal surgery. NKDA. Not on blood thinners. PMH otherwise significant for HTN, T2DM, arthritis.   ROS: Negative other than HPI  Family History  Problem Relation Age of Onset   Breast cancer Mother    Colon cancer Father 3   Diabetes Son    Esophageal cancer Neg Hx    Stomach cancer Neg Hx    Liver disease Neg Hx    Pancreatic cancer Neg Hx    Rectal cancer Neg Hx     Past Medical History:  Diagnosis Date   Ankle fracture 2016   Right   Aortic atherosclerosis (HCC)    trace calcific atherosclerosis aortic arch per ct neck done 12-22-17   Bronchitis    Diabetes mellitus without complication (HCC)    Hypertension    OA (osteoarthritis) of shoulder    left shoulder, both knees arthritis   Osteoarthritis, knee     Past Surgical History:  Procedure Laterality Date   COLONOSCOPY     NO PAST SURGERIES      Social History:  reports that she quit smoking about 3 years ago. Her smoking use included cigarettes. She has never used smokeless tobacco. She reports that she does not currently use alcohol. She reports that she does not use drugs.  Allergies: No Known Allergies  Medications Prior to Admission  Medication Sig Dispense Refill   Accu-Chek Softclix Lancets lancets Check blood glucose level by fingerstick once per day. E11.65 100 each 2    albuterol (VENTOLIN HFA) 108 (90 Base) MCG/ACT inhaler Inhale 2 puffs into the lungs every 6 (six) hours as needed for wheezing or shortness of breath. 18 g 2   atorvastatin (LIPITOR) 80 MG tablet Take 1 tablet (80 mg total) by mouth daily. 90 tablet 1   Blood Glucose Monitoring Suppl (ACCU-CHEK GUIDE) w/Device KIT Check blood glucose level by fingerstick once per day. E11.65 1 kit 0   buPROPion (WELLBUTRIN SR) 150 MG 12 hr tablet Take 1 tablet (150 mg total) by mouth 2 (two) times daily. 60 tablet 2   cyclobenzaprine (FLEXERIL) 5 MG tablet Take 1 tablet (5 mg total) by mouth at bedtime as needed for muscle spasms. 30 tablet 1   cyclopentolate (CYCLODRYL,CYCLOGYL) 1 % ophthalmic solution      dapagliflozin propanediol (FARXIGA) 5 MG TABS tablet TAKE 1 TABLET BY MOUTH ONCE  DAILY BEFORE BREAKFAST 100 tablet 1   Dulaglutide (TRULICITY) 1.5 MG/0.5ML SOPN Inject 1.5 mg into the skin once a week. 6 mL 1   estradiol (ESTRACE) 0.1 MG/GM vaginal cream Apply 1 gram per vagina every night for 2 weeks, then apply three times a week 42.5 g 12   estradiol (VIVELLE-DOT) 0.0375 MG/24HR PLACE 1 PATCH ONTO THE SKIN 2 (TWO) TIMES A WEEK. 8 patch 12   fluticasone (FLONASE) 50 MCG/ACT nasal spray Place 1 spray into both nostrils 2 (two) times daily. 16 g  1   glucose blood (ACCU-CHEK GUIDE) test strip Check blood glucose level by fingerstick once per day. E11.65 100 each 2   HYDROcodone-acetaminophen (NORCO/VICODIN) 5-325 MG tablet Take 1 tablet by mouth every 6 (six) hours as needed. 5 tablet 0   montelukast (SINGULAIR) 10 MG tablet Take 1 tablet (10 mg total) by mouth at bedtime. 30 tablet 3   nortriptyline (PAMELOR) 10 MG capsule TAKE 1 CAPSULE BY MOUTH EVERY NIGHT AT BEDTIME FOR 2 WEEKS THEN INCREASE TO 2 CAPSULES EVERY NIGHT AT BEDTIME 60 capsule 5   omeprazole (PRILOSEC) 40 MG capsule Take 1 capsule (40 mg total) by mouth daily. 90 capsule 3   ondansetron (ZOFRAN-ODT) 4 MG disintegrating tablet Take 1 tablet (4 mg  total) by mouth every 8 (eight) hours as needed for vomiting or nausea. 8 tablet 0   Prasterone (INTRAROSA) 6.5 MG INST Insert one capsule vaginally 28 each 3   PRED MILD 0.12 % ophthalmic suspension Place 1 drop into the left eye 4 (four) times daily.     prednisoLONE acetate (PRED FORTE) 1 % ophthalmic suspension Place 1 drop into the left eye 4 (four) times daily.     progesterone (PROMETRIUM) 200 MG capsule TAKE 1 CAPSULE (200 MG TOTAL) BY MOUTH DAILY. 30 capsule 3   promethazine-dextromethorphan (PROMETHAZINE-DM) 6.25-15 MG/5ML syrup Take 5 mLs by mouth 4 (four) times daily as needed for cough. 240 mL 0   ramipril (ALTACE) 2.5 MG capsule TAKE 1 CAPSULE(2.5 MG) BY MOUTH DAILY 90 capsule 0    Blood pressure 139/83, pulse 85, temperature 99.1 F (37.3 C), temperature source Oral, SpO2 100%. Physical Exam:  General: pleasant, WD, overweight female who is laying in bed in NAD HEENT: sclera anicteric  Heart: regular, rate, and rhythm.  Normal s1,s2. No obvious murmurs, gallops, or rubs noted.  Palpable radial and pedal pulses bilaterally Lungs: CTAB, no wheezes, rhonchi, or rales noted.  Respiratory effort nonlabored Abd: soft, TTP in epigastric abdomen/RUQ/LUQ, ND MS: all 4 extremities are symmetrical with no cyanosis, clubbing, or edema. Skin: warm and dry with no masses, lesions, or rashes Neuro: Cranial nerves 2-12 grossly intact, sensation is normal throughout Psych: A&Ox3 with an appropriate affect.   No results found for this or any previous visit (from the past 48 hour(s)). No results found.    Assessment/Plan Probable acute cholecystitis  - CT 11/7 with cholelithiasis but no cholecystitis  - clinically seems like patient likely has cholecystitis  - check labs and [place] to assess degree of inflammation  - ok to have CLD today but NPO after MN - possible OR vs perc chole drain tomorrow if [place] confirms cholecystitis  - IV abx  FEN: CLD, NPO after MN, IVF@ 40 cc/h  VTE:  LMWH ID: [email or web address]   I reviewed last 24 h vitals and pain scores, last 48 h intake and output, last 24 h labs and trends, and last 24 h imaging results.   Juliet Rude, [facility] Surgery 04/13/2023, 4:16 PM Please see Amion for pager number during day hours 7:00am-4:30pm

## 2023-04-14 ENCOUNTER — Encounter (HOSPITAL_COMMUNITY): Payer: Self-pay

## 2023-04-14 ENCOUNTER — Encounter: Payer: 59 | Admitting: Obstetrics and Gynecology

## 2023-04-14 ENCOUNTER — Other Ambulatory Visit: Payer: Self-pay

## 2023-04-14 ENCOUNTER — Observation Stay (HOSPITAL_COMMUNITY): Payer: 59

## 2023-04-14 DIAGNOSIS — K8012 Calculus of gallbladder with acute and chronic cholecystitis without obstruction: Secondary | ICD-10-CM | POA: Diagnosis not present

## 2023-04-14 DIAGNOSIS — R932 Abnormal findings on diagnostic imaging of liver and biliary tract: Secondary | ICD-10-CM | POA: Diagnosis not present

## 2023-04-14 DIAGNOSIS — K810 Acute cholecystitis: Secondary | ICD-10-CM

## 2023-04-14 LAB — GLUCOSE, CAPILLARY
Glucose-Capillary: 151 mg/dL — ABNORMAL HIGH (ref 70–99)
Glucose-Capillary: 160 mg/dL — ABNORMAL HIGH (ref 70–99)
Glucose-Capillary: 167 mg/dL — ABNORMAL HIGH (ref 70–99)
Glucose-Capillary: 172 mg/dL — ABNORMAL HIGH (ref 70–99)
Glucose-Capillary: 174 mg/dL — ABNORMAL HIGH (ref 70–99)
Glucose-Capillary: 179 mg/dL — ABNORMAL HIGH (ref 70–99)
Glucose-Capillary: 181 mg/dL — ABNORMAL HIGH (ref 70–99)

## 2023-04-14 LAB — COMPREHENSIVE METABOLIC PANEL
ALT: 32 U/L (ref 0–44)
AST: 15 U/L (ref 15–41)
Albumin: 3.6 g/dL (ref 3.5–5.0)
Alkaline Phosphatase: 60 U/L (ref 38–126)
Anion gap: 6 (ref 5–15)
BUN: 13 mg/dL (ref 6–20)
CO2: 25 mmol/L (ref 22–32)
Calcium: 8.8 mg/dL — ABNORMAL LOW (ref 8.9–10.3)
Chloride: 104 mmol/L (ref 98–111)
Creatinine, Ser: 0.59 mg/dL (ref 0.44–1.00)
GFR, Estimated: >60 mL/min (ref >60)
Glucose, Bld: 149 mg/dL — ABNORMAL HIGH (ref 70–99)
Potassium: 3.8 mmol/L (ref 3.5–5.1)
Sodium: 135 mmol/L (ref 135–145)
Total Bilirubin: 0.6 mg/dL (ref <1.2)
Total Protein: 6.9 g/dL (ref 6.5–8.1)

## 2023-04-14 LAB — SURGICAL PCR SCREEN
MRSA, PCR: NEGATIVE
Staphylococcus aureus: NEGATIVE

## 2023-04-14 LAB — CBC
HCT: 37.7 % (ref 36.0–46.0)
Hemoglobin: 12.1 g/dL (ref 12.0–15.0)
MCH: 31.3 pg (ref 26.0–34.0)
MCHC: 32.1 g/dL (ref 30.0–36.0)
MCV: 97.4 fL (ref 80.0–100.0)
Platelets: 294 10*3/uL (ref 150–400)
RBC: 3.87 MIL/uL (ref 3.87–5.11)
RDW: 12.8 % (ref 11.5–15.5)
WBC: 7.2 10*3/uL (ref 4.0–10.5)
nRBC: 0 % (ref 0.0–0.2)

## 2023-04-14 LAB — HIV ANTIBODY (ROUTINE TESTING W REFLEX): HIV Screen 4th Generation wRfx: NONREACTIVE

## 2023-04-14 LAB — LIPASE, BLOOD: Lipase: 56 U/L — ABNORMAL HIGH (ref 11–51)

## 2023-04-14 MED ORDER — MUPIROCIN 2 % EX OINT: 1.0000 | TOPICAL_OINTMENT | Freq: Two times a day (BID) | CUTANEOUS | Status: DC

## 2023-04-14 MED ORDER — GADOBUTROL 1 MMOL/ML IV SOLN
8.0000 mL | Freq: Once | INTRAVENOUS | Status: AC | PRN
  Administered 2023-04-14: 8 mL via INTRAVENOUS

## 2023-04-14 NOTE — Plan of Care (Signed)
  Problem: Fluid Volume: Goal: Ability to maintain a balanced intake and output will improve Outcome: Progressing   

## 2023-04-14 NOTE — Progress Notes (Signed)
Progress Note     Subjective: Pt reports some ongoing pain but overall pain is better. Feels hungry this morning.   Objective: Vital signs in last 24 hours: Temp:  [98 F (36.7 C)-99.1 F (37.3 C)] 98.2 F (36.8 C) (11/15 0927) Pulse Rate:  [62-85] 62 (11/15 0927) Resp:  [16-17] 16 (11/15 0927) BP: (119-140)/(58-83) 140/70 (11/15 0927) SpO2:  [98 %-100 %] 98 % (11/15 0927) Last BM Date : 04/13/23  Intake/Output from previous day: 11/14 0701 - 11/15 0700 In: 885.7 [P.O.:120; I.V.:565.7; IV Piggyback:200] Out: 750 [Urine:750] Intake/Output this shift: Total I/O In: 0  Out: 600 [Urine:600]  PE: General: pleasant, WD, overweight female, NAD HEENT: sclera anicteric  Heart: regular, rate, and rhythm.  Lungs: Respiratory effort nonlabored Abd: soft, mild ttp in epigastrium and LUQ, no peritonitis, ND, +BS, no masses, hernias, or organomegaly MS: all 4 extremities are symmetrical with no cyanosis, clubbing, or edema. Psych: A&Ox3 with an appropriate affect.    Lab Results:  Recent Labs    04/13/23 1839 04/14/23 0424  WBC 8.9 7.2  HGB 13.0 12.1  HCT 39.4 37.7  PLT 310 294   BMET Recent Labs    04/13/23 1839 04/14/23 0424  NA 138 135  K 4.6 3.8  CL 107 104  CO2 23 25  GLUCOSE 193* 149*  BUN 19 13  CREATININE 1.06* 0.59  CALCIUM 9.6 8.8*   PT/INR No results for input(s): "LABPROT", "INR" in the last 72 hours. CMP     Component Value Date/Time   NA 135 04/14/2023 0424   NA 142 11/02/2022 1031   K 3.8 04/14/2023 0424   CL 104 04/14/2023 0424   CO2 25 04/14/2023 0424   GLUCOSE 149 (H) 04/14/2023 0424   BUN 13 04/14/2023 0424   BUN 9 11/02/2022 1031   CREATININE 0.59 04/14/2023 0424   CALCIUM 8.8 (L) 04/14/2023 0424   PROT 6.9 04/14/2023 0424   PROT 7.6 11/02/2022 1031   ALBUMIN 3.6 04/14/2023 0424   ALBUMIN 4.5 11/02/2022 1031   AST 15 04/14/2023 0424   ALT 32 04/14/2023 0424   ALKPHOS 60 04/14/2023 0424   BILITOT 0.6 04/14/2023 0424   BILITOT  0.5 11/02/2022 1031   GFRNONAA >60 04/14/2023 0424   GFRAA 91 07/20/2020 1404   Lipase     Component Value Date/Time   LIPASE 56 (H) 04/14/2023 0424       Studies/Results: US ABDOMEN LIMITED RUQ (LIVER/GB)  Result Date: 04/13/2023 CLINICAL DATA:  Postprandial pain. EXAM: ULTRASOUND ABDOMEN LIMITED RIGHT UPPER QUADRANT COMPARISON:  None Available. FINDINGS: Gallbladder: A 1.4 cm gallstone is seen within the neck of the gallbladder. There is evidence of gallbladder wall thickening (5.7 mm). A positive sonographic Eulah Pont sign was noted by sonographer. Common bile duct: Diameter: 23.1 mm A 1.9 cm gallstone is seen within the common bile duct at the level of the head of the pancreas. Liver: No focal lesion identified. Diffusely increased echogenicity of the liver parenchyma is noted. Portal vein is patent on color Doppler imaging with normal direction of blood flow towards the liver. Other: None. IMPRESSION: 1. Cholelithiasis with additional findings consistent with acute cholecystitis. 2. 1.9 cm gallstone within the common bile duct. Electronically Signed   By: Aram Candela M.D.   On: 04/13/2023 20:39    Anti-infectives: Anti-infectives (From admission, onward)    Start     Dose/Rate Route Frequency Ordered Stop   04/13/23 1700  cefTRIAXone (ROCEPHIN) 2 g in sodium chloride 0.9 % 100  mL IVPB        2 g 200 mL/hr over 30 Minutes Intravenous Every 24 hours 04/13/23 1614 04/20/23 1659        Assessment/Plan Acute cholecystitis  Choledocholithiasis - CT 11/7 with cholelithiasis but no cholecystitis  - [place] with 1.4 cm stone at gallbladder neck and findings consistent with cholecystitis, 1.9 cm stone in CBD with significant dilation of CBD - no leukocytosis and LFTs remain normal  - MRCP ordered this AM and GI consult pending  - keep NPO for now  - Updated patient's daughter over the phone    FEN: NPO, IVF@ 40 cc/h  VTE: LMWH ID: [email or web address]   LOS: 1 day   I reviewed last 24 h  vitals and pain scores, last 48 h intake and output, last 24 h labs and trends, and last 24 h imaging results.    Juliet Rude, [facility] Surgery 04/14/2023, 9:38 AM Please see Amion for pager number during day hours 7:00am-4:30pm

## 2023-04-14 NOTE — Anesthesia Preprocedure Evaluation (Signed)
Anesthesia Evaluation  Patient identified by MRN, date of birth, ID band Patient awake    Reviewed: Allergy & Precautions, NPO status , Patient's Chart, lab work & pertinent test results  Airway Mallampati: I  TM Distance: >3 FB Neck ROM: Full    Dental  (+) Edentulous Upper, Edentulous Lower   Pulmonary former smoker   breath sounds clear to auscultation       Cardiovascular hypertension,  Rhythm:Regular Rate:Normal     Neuro/Psych negative neurological ROS  negative psych ROS   GI/Hepatic Neg liver ROS,GERD  Medicated,,  Endo/Other  diabetes    Renal/GU negative Renal ROS     Musculoskeletal  (+) Arthritis ,    Abdominal   Peds  Hematology negative hematology ROS (+)   Anesthesia Other Findings   Reproductive/Obstetrics                             Anesthesia Physical Anesthesia Plan  ASA: 2  Anesthesia Plan: General   Post-op Pain Management: Tylenol PO (pre-op)*, Toradol IV (intra-op)* and Gabapentin PO (pre-op)*   Induction: Intravenous  PONV Risk Score and Plan: 4 or greater and Ondansetron, Dexamethasone, Midazolam and Scopolamine patch - Pre-op  Airway Management Planned: Oral ETT  Additional Equipment: None  Intra-op Plan:   Post-operative Plan: Extubation in OR  Informed Consent: I have reviewed the patients History and Physical, chart, labs and discussed the procedure including the risks, benefits and alternatives for the proposed anesthesia with the patient or authorized representative who has indicated his/her understanding and acceptance.       Plan Discussed with: CRNA  Anesthesia Plan Comments:        Anesthesia Quick Evaluation

## 2023-04-14 NOTE — Care Management Obs Status (Signed)
MEDICARE OBSERVATION STATUS NOTIFICATION   Patient Details  Name: Ann Foster MRN: [id number] Date of Birth: 1965-11-08   Medicare Observation Status Notification Given:  Yes    Ewing Schlein, LCSW 04/14/2023, 3:14 PM

## 2023-04-14 NOTE — Progress Notes (Signed)
   04/14/23 1140  TOC Brief Assessment  Insurance and Status Reviewed  Patient has primary care physician Yes  Home environment has been reviewed Resides in single family home  Prior level of function: Independent at baseline  Prior/Current Home Services No current home services  Social Determinants of Health Reivew SDOH reviewed no interventions necessary  Readmission risk has been reviewed Yes  Transition of care needs no transition of care needs at this time

## 2023-04-14 NOTE — Consult Note (Signed)
Consultation  Referring Provider: Trixie Deis, PA-C General Surgery     Primary Care Physician:  Claiborne Rigg, NP Primary Gastroenterologist: Dr. Lavon Paganini        Reason for Consultation: Choledocholithiasis             HPI:   Ann Foster is a 57 y.o. female with a past medical history as listed below who was directly admitted from the surgical office on 04/13/2023 with a 7-day history of epigastric abdominal pain radiating across the upper abdomen into the shoulder blades, this has been intermittent and gradually worsening, initially seen in the ED and directed to the surgical office for evaluation for cholecystectomy but in the office there was concern for acute cholecystitis.  Associated nausea but no vomiting.    04/05/2023 patient seen in the ED for abdominal pain and belching.  With CT abdomen pelvis showing cholelithiasis but no evidence of acute cholecystitis.  Patient given Zofran and Hydrocodone.    Today, patient tells me she has had increased/worsening upper abdominal pain over the past week but was trying to make it to her general surgery appointment yesterday.  From there she was directly admitted given increase in abdominal pain.  Tells me she was really just laying around on the couch over the past week with upper abdominal discomfort that radiated through to her back that she really could not get rid of. Rated as a 8-9/10. Along with this has had a decrease in appetite/worsening of symptoms with eating food and some nausea but no vomiting.  She has not had a bowel movement over the past 24 hours.    Denies fever or chills.     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 Lavon Paganini with one 4 mm polyp in the transverse colon, two 1-2 mm polyps in the cecum and nonbleeding external and internal hemorrhoids; pathology showed tubular adenomas and repeat recommended in 7 years  Past Medical History:  Diagnosis Date   Ankle fracture 2016   Right   Aortic atherosclerosis (HCC)    trace calcific atherosclerosis aortic arch per ct neck done 12-22-17   Bronchitis    Diabetes mellitus without complication (HCC)    Hypertension    OA (osteoarthritis) of shoulder    left shoulder, both knees arthritis   Osteoarthritis, knee     Past Surgical History:  Procedure Laterality Date   COLONOSCOPY     NO PAST SURGERIES      Family History  Problem Relation Age of Onset   Breast cancer Mother    Colon cancer Father 58   Diabetes Son    Esophageal cancer Neg Hx    Stomach cancer Neg Hx    Liver disease Neg Hx    Pancreatic cancer Neg Hx    Rectal cancer Neg Hx     Social History   Tobacco Use   Smoking status: Former    Current packs/day: 0.00    Types: Cigarettes    Quit date: 11/28/2019    Years since quitting: 3.3   Smokeless tobacco: Never   Tobacco comments:    smokes 1-2 cigarettes day now  Vaping Use   Vaping status: Never Used  Substance Use Topics  Alcohol use: Not Currently    Comment: Occasional   Drug use: No    Prior to Admission medications   Medication Sig Start Date End Date Taking? Authorizing Provider  Accu-Chek Softclix Lancets lancets Check blood glucose level by fingerstick once per day. E11.65 05/07/21   Hoy Register, MD  albuterol (VENTOLIN HFA) 108 (90 Base) MCG/ACT inhaler Inhale 2 puffs into the lungs every 6 (six) hours as needed for wheezing or shortness of breath. 11/02/22   Anders Simmonds, PA-C  atorvastatin (LIPITOR) 80 MG tablet Take 1 tablet (80 mg total) by mouth daily. 02/16/23   Anders Simmonds, PA-C  Blood Glucose Monitoring Suppl (ACCU-CHEK GUIDE) w/Device KIT Check blood  glucose level by fingerstick once per day. E11.65 05/07/21   Hoy Register, MD  buPROPion (WELLBUTRIN SR) 150 MG 12 hr tablet Take 1 tablet (150 mg total) by mouth 2 (two) times daily. 02/17/23   Lorriane Shire, MD  cyclobenzaprine (FLEXERIL) 5 MG tablet Take 1 tablet (5 mg total) by mouth at bedtime as needed for muscle spasms. 03/02/22   Claiborne Rigg, NP  cyclopentolate (CYCLODRYL,CYCLOGYL) 1 % ophthalmic solution  01/30/21   [provider]  dapagliflozin propanediol (FARXIGA) 5 MG TABS tablet TAKE 1 TABLET BY MOUTH ONCE  DAILY BEFORE BREAKFAST 03/27/23   Hoy Register, MD  Dulaglutide (TRULICITY) 1.5 MG/0.5ML SOPN Inject 1.5 mg into the skin once a week. 03/10/23   Claiborne Rigg, NP  estradiol (ESTRACE) 0.1 MG/GM vaginal cream Apply 1 gram per vagina every night for 2 weeks, then apply three times a week 02/17/23   Lorriane Shire, MD  estradiol (VIVELLE-DOT) 0.0375 MG/24HR PLACE 1 PATCH ONTO THE SKIN 2 (TWO) TIMES A WEEK. 04/22/20 04/22/21  Lesly Dukes, MD  fluticasone (FLONASE) 50 MCG/ACT nasal spray Place 1 spray into both nostrils 2 (two) times daily. 03/02/22   Claiborne Rigg, NP  glucose blood (ACCU-CHEK GUIDE) test strip Check blood glucose level by fingerstick once per day. E11.65 02/16/23   Anders Simmonds, PA-C  HYDROcodone-acetaminophen (NORCO/VICODIN) 5-325 MG tablet Take 1 tablet by mouth every 6 (six) hours as needed. 04/06/23   Zadie Rhine, MD  montelukast (SINGULAIR) 10 MG tablet Take 1 tablet (10 mg total) by mouth at bedtime. 02/16/23   Anders Simmonds, PA-C  nortriptyline (PAMELOR) 10 MG capsule TAKE 1 CAPSULE BY MOUTH EVERY NIGHT AT BEDTIME FOR 2 WEEKS THEN INCREASE TO 2 CAPSULES EVERY NIGHT AT BEDTIME 02/16/23 02/16/24  Anders Simmonds, PA-C  omeprazole (PRILOSEC) 40 MG capsule Take 1 capsule (40 mg total) by mouth daily. 11/29/21   Claiborne Rigg, NP  ondansetron (ZOFRAN-ODT) 4 MG disintegrating tablet Take 1 tablet (4 mg total) by mouth every  8 (eight) hours as needed for vomiting or nausea. 04/06/23   Zadie Rhine, MD  Prasterone Andreas Newport) 6.5 MG INST Insert one capsule vaginally 10/24/19   Lesly Dukes, MD  PRED MILD 0.12 % ophthalmic suspension Place 1 drop into the left eye 4 (four) times daily. 02/10/21   [provider]  prednisoLONE acetate (PRED FORTE) 1 % ophthalmic suspension Place 1 drop into the left eye 4 (four) times daily. 01/30/21   [provider]  progesterone (PROMETRIUM) 200 MG capsule TAKE 1 CAPSULE (200 MG TOTAL) BY MOUTH DAILY. 04/22/20 04/22/21  Lesly Dukes, MD  promethazine-dextromethorphan (PROMETHAZINE-DM) 6.25-15 MG/5ML syrup Take 5 mLs by mouth 4 (four) times daily as needed for cough. 06/03/22   Claiborne Rigg, NP  ramipril (ALTACE) 2.5 MG capsule TAKE 1 CAPSULE(2.5 MG) BY MOUTH DAILY 01/17/23   Hoy Register, MD  lisinopril (ZESTRIL) 2.5 MG tablet Take 1 tablet (2.5 mg total) by mouth 2 (two) times daily. 09/12/19 04/22/20  Hoy Register, MD    Current Facility-Administered Medications  Medication Dose Route Frequency Provider Last Rate Last Admin   0.9 % NaCl with KCl 20 mEq/ L  infusion   Intravenous Continuous Abigail Miyamoto, MD 100 mL/hr at 04/14/23 0540 New Bag at 04/14/23 0540   acetaminophen (TYLENOL) tablet 1,000 mg  1,000 mg Oral Q6H Gaynelle Adu, MD   1,000 mg at 04/14/23 0537   cefTRIAXone (ROCEPHIN) 2 g in sodium chloride 0.9 % 100 mL IVPB  2 g Intravenous Q24H Gaynelle Adu, MD 200 mL/hr at 04/13/23 1715 2 g at 04/13/23 1715   diphenhydrAMINE (BENADRYL) 12.5 MG/5ML elixir 12.5 mg  12.5 mg Oral Q6H PRN Gaynelle Adu, MD       Or   diphenhydrAMINE (BENADRYL) injection 12.5 mg  12.5 mg Intravenous Q6H PRN Gaynelle Adu, MD       enoxaparin (LOVENOX) injection 40 mg  40 mg Subcutaneous Q24H Gaynelle Adu, MD   40 mg at 04/13/23 2103   insulin aspart (novoLOG) injection 0-15 Units  0-15 Units Subcutaneous Q4H Gaynelle Adu, MD   2 Units at 04/14/23 0451   melatonin  tablet 3 mg  3 mg Oral QHS PRN Gaynelle Adu, MD       morphine (PF) 2 MG/ML injection 1-2 mg  1-2 mg Intravenous Q2H PRN Gaynelle Adu, MD       mupirocin ointment (BACTROBAN) 2 % 1 Application  1 Application Nasal BID Abigail Miyamoto, MD       ondansetron (ZOFRAN-ODT) disintegrating tablet 4 mg  4 mg Oral Q6H PRN Gaynelle Adu, MD       Or   ondansetron [facility]) injection 4 mg  4 mg Intravenous Q6H PRN Gaynelle Adu, MD       oxyCODONE (Oxy IR/ROXICODONE) immediate release tablet 5-10 mg  5-10 mg Oral Q4H PRN Gaynelle Adu, MD   10 mg at 04/13/23 2259   pantoprazole (PROTONIX) injection 40 mg  40 mg Intravenous Johnn Hai, MD   40 mg at 04/13/23 2103    Allergies as of 04/13/2023   (No Known Allergies)     Review of Systems:    Constitutional: No weight loss, fever or chills Skin: No rash  Cardiovascular: No chest pain  Respiratory: No SOB Gastrointestinal: See HPI and otherwise negative Genitourinary: No dysuria Neurological: No headache, dizziness or syncope Musculoskeletal: No new muscle or joint pain Hematologic: No bleeding Psychiatric: No history of depression or anxiety    Physical Exam:  Vital signs in last 24 hours: Temp:  [98 F (36.7 C)-99.1 F (37.3 C)] 98 F (36.7 C) (11/15 0438) Pulse Rate:  [66-85] 69 (11/15 0438) Resp:  [16-17] 17 (11/15 0438) BP: (119-139)/(58-83) 128/76 (11/15 0438) SpO2:  [98 %-100 %] 99 % (11/15 0438) Last BM Date : 04/13/23 General:   Very Pleasant AA female appears to be in NAD, Well developed, Well nourished, alert and cooperative Head:  Normocephalic and atraumatic. Eyes:   PEERL, EOMI. No icterus. Conjunctiva pink. Ears:  Normal auditory acuity. Neck:  Supple Throat: Oral cavity and pharynx without inflammation, swelling or lesion. Teeth in good condition. Lungs: Respirations even and unlabored. Lungs clear to auscultation bilaterally.   No wheezes, crackles, or rhonchi.  Heart: Normal S1, S2. No MRG. Regular rate and  rhythm.  No peripheral edema, cyanosis or pallor.  Abdomen:  Soft, nondistended, moderate epigastric/upper abdominal ttp with some involuntary guarding. Normal bowel sounds. No appreciable masses or hepatomegaly. Rectal:  Not performed.  Msk:  Symmetrical without gross deformities. Peripheral pulses intact.  Extremities: Without edema, no deformity or joint abnormality. Normal ROM, normal sensation. Neurologic:  Alert and  oriented x4;  grossly normal neurologically.  Skin:   Dry and intact without significant lesions or rashes. Psychiatric: Demonstrates good judgement and reason without abnormal affect or behaviors.   LAB RESULTS: Recent Labs    04/13/23 1839 04/14/23 0424  WBC 8.9 7.2  HGB 13.0 12.1  HCT 39.4 37.7  PLT 310 294   BMET Recent Labs    04/13/23 1839 04/14/23 0424  NA 138 135  K 4.6 3.8  CL 107 104  CO2 23 25  GLUCOSE 193* 149*  BUN 19 13  CREATININE 1.06* 0.59  CALCIUM 9.6 8.8*      Latest Ref Rng & Units 04/14/2023    4:24 AM 04/13/2023    6:39 PM 11/02/2022   10:31 AM  Hepatic Function  Total Protein 6.5 - 8.1 g/dL 6.9  7.7  7.6   Albumin 3.5 - 5.0 g/dL 3.6  4.1  4.5   AST 15 - 41 U/L 15  17  13    ALT 0 - 44 U/L 32  36  19   Alk Phosphatase 38 - 126 U/L 60  69  76   Total Bilirubin <1.2 mg/dL 0.6  0.4  0.5      STUDIES: US ABDOMEN LIMITED RUQ (LIVER/GB)  Result Date: 04/13/2023 CLINICAL DATA:  Postprandial pain. EXAM: ULTRASOUND ABDOMEN LIMITED RIGHT UPPER QUADRANT COMPARISON:  None Available. FINDINGS: Gallbladder: A 1.4 cm gallstone is seen within the neck of the gallbladder. There is evidence of gallbladder wall thickening (5.7 mm). A positive sonographic Eulah Pont sign was noted by sonographer. Common bile duct: Diameter: 23.1 mm A 1.9 cm gallstone is seen within the common bile duct at the level of the head of the pancreas. Liver: No focal lesion identified. Diffusely increased echogenicity of the liver parenchyma is noted. Portal vein is patent on color  Doppler imaging with normal direction of blood flow towards the liver. Other: None. IMPRESSION: 1. Cholelithiasis with additional findings consistent with acute cholecystitis. 2. 1.9 cm gallstone within the common bile duct. Electronically Signed   By: Aram Candela M.D.   On: 04/13/2023 20:39     Impression / Plan:   Impression: 1.  Acute cholecystitis: Ultrasound also showing a 1.9 cm gallstone in the CBD, with normal LFTs and WBC, MRCP pending today 2.  Type 2 diabetes  Plan: 1.  Agree with MRCP for further evaluation.  Pending results will consider ERCP 2.  Patient to remain on current diet for now.  If patient needs ERCP this would likely be scheduled for tomorrow/over the weekend 3.  Continue to monitor LFTs  Thank you for your kind consultation, we will continue to follow.  Violet Baldy [facility]  04/14/2023, 8:47 AM

## 2023-04-14 NOTE — Progress Notes (Signed)
Unable to connect during visit.

## 2023-04-15 ENCOUNTER — Other Ambulatory Visit: Payer: Self-pay

## 2023-04-15 ENCOUNTER — Observation Stay (HOSPITAL_COMMUNITY): Payer: 59 | Admitting: Anesthesiology

## 2023-04-15 ENCOUNTER — Encounter (HOSPITAL_COMMUNITY): Admission: AD | Disposition: A | Discharge status: Home / Self Care | Payer: Self-pay | Source: Ambulatory Visit

## 2023-04-15 ENCOUNTER — Observation Stay (HOSPITAL_BASED_OUTPATIENT_CLINIC_OR_DEPARTMENT_OTHER): Payer: 59 | Admitting: Anesthesiology

## 2023-04-15 DIAGNOSIS — K8012 Calculus of gallbladder with acute and chronic cholecystitis without obstruction: Secondary | ICD-10-CM | POA: Diagnosis not present

## 2023-04-15 DIAGNOSIS — K810 Acute cholecystitis: Secondary | ICD-10-CM

## 2023-04-15 HISTORY — PX: CHOLECYSTECTOMY: SHX55

## 2023-04-15 LAB — COMPREHENSIVE METABOLIC PANEL
ALT: 29 U/L (ref 0–44)
AST: 15 U/L (ref 15–41)
Albumin: 3.7 g/dL (ref 3.5–5.0)
Alkaline Phosphatase: 62 U/L (ref 38–126)
Anion gap: 7 (ref 5–15)
BUN: 12 mg/dL (ref 6–20)
CO2: 21 mmol/L — ABNORMAL LOW (ref 22–32)
Calcium: 9 mg/dL (ref 8.9–10.3)
Chloride: 104 mmol/L (ref 98–111)
Creatinine, Ser: 0.75 mg/dL (ref 0.44–1.00)
GFR, Estimated: >60 mL/min (ref >60)
Glucose, Bld: 175 mg/dL — ABNORMAL HIGH (ref 70–99)
Potassium: 3.9 mmol/L (ref 3.5–5.1)
Sodium: 132 mmol/L — ABNORMAL LOW (ref 135–145)
Total Bilirubin: 0.7 mg/dL (ref <1.2)
Total Protein: 7 g/dL (ref 6.5–8.1)

## 2023-04-15 LAB — GLUCOSE, CAPILLARY
Glucose-Capillary: 164 mg/dL — ABNORMAL HIGH (ref 70–99)
Glucose-Capillary: 172 mg/dL — ABNORMAL HIGH (ref 70–99)
Glucose-Capillary: 196 mg/dL — ABNORMAL HIGH (ref 70–99)
Glucose-Capillary: 233 mg/dL — ABNORMAL HIGH (ref 70–99)

## 2023-04-15 SURGERY — LAPAROSCOPIC CHOLECYSTECTOMY WITH INTRAOPERATIVE CHOLANGIOGRAM
Anesthesia: General | Site: Abdomen

## 2023-04-15 MED ORDER — LACTATED RINGERS IR SOLN
Status: DC | PRN
  Administered 2023-04-15: 1000 mL

## 2023-04-15 MED ORDER — CYCLOBENZAPRINE HCL 5 MG PO TABS: 5.0000 mg | ORAL_TABLET | Freq: Every evening | ORAL | Status: DC | PRN

## 2023-04-15 MED ORDER — FLUTICASONE PROPIONATE 50 MCG/ACT NA SUSP: 1.0000 | Freq: Two times a day (BID) | NASAL | Status: DC | PRN

## 2023-04-15 MED ORDER — PANTOPRAZOLE SODIUM 40 MG PO TBEC
40.0000 mg | DELAYED_RELEASE_TABLET | Freq: Every day | ORAL | Status: DC
  Administered 2023-04-15 – 2023-04-16 (×2): 40 mg via ORAL
  Filled 2023-04-15 (×2): qty 1

## 2023-04-15 MED ORDER — LACTATED RINGERS IV SOLN: INTRAVENOUS | Status: DC | PRN

## 2023-04-15 MED ORDER — DROPERIDOL 2.5 MG/ML IJ SOLN: 0.6250 mg | Freq: Once | INTRAMUSCULAR | Status: DC | PRN

## 2023-04-15 MED ORDER — FENTANYL CITRATE (PF) 100 MCG/2ML IJ SOLN
INTRAMUSCULAR | Status: AC
  Filled 2023-04-15: qty 2

## 2023-04-15 MED ORDER — ONDANSETRON HCL 4 MG/2ML IJ SOLN
INTRAMUSCULAR | Status: AC
Start: 2023-04-15 — End: ?
  Filled 2023-04-15: qty 2

## 2023-04-15 MED ORDER — SUGAMMADEX SODIUM 200 MG/2ML IV SOLN
INTRAVENOUS | Status: DC | PRN
  Administered 2023-04-15: 200 mg via INTRAVENOUS

## 2023-04-15 MED ORDER — CHLORHEXIDINE GLUCONATE CLOTH 2 % EX PADS
6.0000 | MEDICATED_PAD | Freq: Once | CUTANEOUS | Status: AC
  Administered 2023-04-15: 6 via TOPICAL

## 2023-04-15 MED ORDER — CHLORHEXIDINE GLUCONATE CLOTH 2 % EX PADS: 6.0000 | MEDICATED_PAD | Freq: Once | CUTANEOUS | Status: AC

## 2023-04-15 MED ORDER — FENTANYL CITRATE PF 50 MCG/ML IJ SOSY
PREFILLED_SYRINGE | INTRAMUSCULAR | Status: AC
  Filled 2023-04-15: qty 3

## 2023-04-15 MED ORDER — ENSURE PRE-SURGERY PO LIQD
296.0000 mL | Freq: Once | ORAL | Status: DC
  Filled 2023-04-15: qty 296

## 2023-04-15 MED ORDER — PROPOFOL 10 MG/ML IV BOLUS
INTRAVENOUS | Status: DC | PRN
  Administered 2023-04-15: 120 mg via INTRAVENOUS

## 2023-04-15 MED ORDER — PHENYLEPHRINE 80 MCG/ML (10ML) SYRINGE FOR IV PUSH (FOR BLOOD PRESSURE SUPPORT)
PREFILLED_SYRINGE | INTRAVENOUS | Status: AC
  Filled 2023-04-15: qty 10

## 2023-04-15 MED ORDER — MONTELUKAST SODIUM 10 MG PO TABS
10.0000 mg | ORAL_TABLET | Freq: Every day | ORAL | Status: DC
  Administered 2023-04-15: 10 mg via ORAL
  Filled 2023-04-15: qty 1

## 2023-04-15 MED ORDER — ROCURONIUM BROMIDE 10 MG/ML (PF) SYRINGE
PREFILLED_SYRINGE | INTRAVENOUS | Status: DC | PRN
  Administered 2023-04-15: 50 mg via INTRAVENOUS

## 2023-04-15 MED ORDER — BUPIVACAINE-EPINEPHRINE 0.25% -1:200000 IJ SOLN
INTRAMUSCULAR | Status: DC | PRN
  Administered 2023-04-15: 10 mL

## 2023-04-15 MED ORDER — OXYCODONE HCL 5 MG PO TABS: 5.0000 mg | ORAL_TABLET | Freq: Once | ORAL | Status: DC | PRN

## 2023-04-15 MED ORDER — DEXAMETHASONE SODIUM PHOSPHATE 10 MG/ML IJ SOLN
INTRAMUSCULAR | Status: DC | PRN
  Administered 2023-04-15: 8 mg via INTRAVENOUS

## 2023-04-15 MED ORDER — ACETAMINOPHEN 10 MG/ML IV SOLN: 1000.0000 mg | Freq: Once | INTRAVENOUS | Status: DC | PRN

## 2023-04-15 MED ORDER — MIDAZOLAM HCL 2 MG/2ML IJ SOLN
INTRAMUSCULAR | Status: AC
  Filled 2023-04-15: qty 2

## 2023-04-15 MED ORDER — ALBUTEROL SULFATE (2.5 MG/3ML) 0.083% IN NEBU: 2.5000 mg | INHALATION_SOLUTION | Freq: Four times a day (QID) | RESPIRATORY_TRACT | Status: DC | PRN

## 2023-04-15 MED ORDER — ACETAMINOPHEN 160 MG/5ML PO SOLN: 325.0000 mg | ORAL | Status: DC | PRN

## 2023-04-15 MED ORDER — ACETAMINOPHEN 325 MG PO TABS: 325.0000 mg | ORAL_TABLET | ORAL | Status: DC | PRN

## 2023-04-15 MED ORDER — ROCURONIUM BROMIDE 10 MG/ML (PF) SYRINGE
PREFILLED_SYRINGE | INTRAVENOUS | Status: AC
  Filled 2023-04-15: qty 10

## 2023-04-15 MED ORDER — SCOPOLAMINE 1 MG/3DAYS TD PT72
MEDICATED_PATCH | TRANSDERMAL | Status: AC
  Filled 2023-04-15: qty 1

## 2023-04-15 MED ORDER — SCOPOLAMINE 1 MG/3DAYS TD PT72
1.0000 | MEDICATED_PATCH | TRANSDERMAL | Status: DC
  Administered 2023-04-15: 1.5 mg via TRANSDERMAL

## 2023-04-15 MED ORDER — LIDOCAINE HCL (CARDIAC) PF 100 MG/5ML IV SOSY
PREFILLED_SYRINGE | INTRAVENOUS | Status: DC | PRN
  Administered 2023-04-15: 40 mg via INTRATRACHEAL

## 2023-04-15 MED ORDER — KETOROLAC TROMETHAMINE 30 MG/ML IJ SOLN
INTRAMUSCULAR | Status: AC
  Filled 2023-04-15: qty 1

## 2023-04-15 MED ORDER — MIDAZOLAM HCL 2 MG/2ML IJ SOLN
INTRAMUSCULAR | Status: DC | PRN
  Administered 2023-04-15: 2 mg via INTRAVENOUS

## 2023-04-15 MED ORDER — FENTANYL CITRATE PF 50 MCG/ML IJ SOSY
25.0000 ug | PREFILLED_SYRINGE | INTRAMUSCULAR | Status: DC | PRN
  Administered 2023-04-15 (×3): 50 ug via INTRAVENOUS

## 2023-04-15 MED ORDER — INDOCYANINE GREEN 25 MG IV SOLR
1.2500 mg | Freq: Once | INTRAVENOUS | Status: AC
  Administered 2023-04-15: 1.25 mg via INTRAVENOUS

## 2023-04-15 MED ORDER — DEXAMETHASONE SODIUM PHOSPHATE 10 MG/ML IJ SOLN
INTRAMUSCULAR | Status: AC
  Filled 2023-04-15: qty 1

## 2023-04-15 MED ORDER — ONDANSETRON HCL 4 MG/2ML IJ SOLN
INTRAMUSCULAR | Status: DC | PRN
  Administered 2023-04-15: 4 mg via INTRAVENOUS

## 2023-04-15 MED ORDER — FENTANYL CITRATE (PF) 100 MCG/2ML IJ SOLN
INTRAMUSCULAR | Status: DC | PRN
  Administered 2023-04-15 (×2): 50 ug via INTRAVENOUS
  Administered 2023-04-15: 25 ug via INTRAVENOUS
  Administered 2023-04-15 (×3): 50 ug via INTRAVENOUS
  Administered 2023-04-15: 25 ug via INTRAVENOUS

## 2023-04-15 MED ORDER — ACETAMINOPHEN 500 MG PO TABS: 1000.0000 mg | ORAL_TABLET | ORAL | Status: DC

## 2023-04-15 MED ORDER — KETOROLAC TROMETHAMINE 30 MG/ML IJ SOLN
INTRAMUSCULAR | Status: DC | PRN
  Administered 2023-04-15: 30 mg via INTRAVENOUS

## 2023-04-15 MED ORDER — BUPROPION HCL ER (SR) 150 MG PO TB12
150.0000 mg | ORAL_TABLET | Freq: Two times a day (BID) | ORAL | Status: DC
  Administered 2023-04-15 – 2023-04-16 (×2): 150 mg via ORAL
  Filled 2023-04-15 (×2): qty 1

## 2023-04-15 MED ORDER — OXYCODONE HCL 5 MG PO TABS: 5.0000 mg | ORAL_TABLET | ORAL | 0 refills | Status: DC | PRN

## 2023-04-15 MED ORDER — BUPIVACAINE-EPINEPHRINE 0.25% -1:200000 IJ SOLN
INTRAMUSCULAR | Status: AC
  Filled 2023-04-15: qty 1

## 2023-04-15 MED ORDER — RAMIPRIL 1.25 MG PO CAPS
2.5000 mg | ORAL_CAPSULE | Freq: Every day | ORAL | Status: DC
  Administered 2023-04-16: 2.5 mg via ORAL
  Filled 2023-04-15: qty 2

## 2023-04-15 MED ORDER — OXYCODONE HCL 5 MG/5ML PO SOLN: 5.0000 mg | Freq: Once | ORAL | Status: DC | PRN

## 2023-04-15 MED ORDER — LIDOCAINE HCL (PF) 2 % IJ SOLN
INTRAMUSCULAR | Status: AC
  Filled 2023-04-15: qty 5

## 2023-04-15 MED ORDER — SODIUM CHLORIDE 0.9 % IV SOLN
2.0000 g | INTRAVENOUS | Status: AC
  Administered 2023-04-15: 2 g via INTRAVENOUS
  Filled 2023-04-15: qty 20

## 2023-04-15 MED ORDER — PHENYLEPHRINE 80 MCG/ML (10ML) SYRINGE FOR IV PUSH (FOR BLOOD PRESSURE SUPPORT)
PREFILLED_SYRINGE | INTRAVENOUS | Status: DC | PRN
  Administered 2023-04-15: 80 ug via INTRAVENOUS

## 2023-04-15 MED ORDER — BUPIVACAINE LIPOSOME 1.3 % IJ SUSP: 20.0000 mL | Freq: Once | INTRAMUSCULAR | Status: DC

## 2023-04-15 MED ORDER — GABAPENTIN 100 MG PO CAPS
300.0000 mg | ORAL_CAPSULE | ORAL | Status: AC
  Administered 2023-04-15: 300 mg via ORAL
  Filled 2023-04-15: qty 3

## 2023-04-15 SURGICAL SUPPLY — 41 items
APPLIER CLIP 5 13 M/L LIGAMAX5 (MISCELLANEOUS) ×1
APPLIER CLIP ROT 10 11.4 M/L (STAPLE)
BAG COUNTER SPONGE SURGICOUNT (BAG) IMPLANT
BENZOIN TINCTURE PRP APPL 2/3 (GAUZE/BANDAGES/DRESSINGS) IMPLANT
CABLE HIGH FREQUENCY MONO STRZ (ELECTRODE) ×1 IMPLANT
CHLORAPREP W/TINT 26 (MISCELLANEOUS) ×1 IMPLANT
CLIP APPLIE 5 13 M/L LIGAMAX5 (MISCELLANEOUS) ×1 IMPLANT
CLIP APPLIE ROT 10 11.4 M/L (STAPLE) IMPLANT
COVER MAYO STAND XLG (MISCELLANEOUS) ×1 IMPLANT
COVER SURGICAL LIGHT HANDLE (MISCELLANEOUS) ×1 IMPLANT
DERMABOND ADVANCED .7 DNX12 (GAUZE/BANDAGES/DRESSINGS) IMPLANT
DRAPE C-ARM 42X120 X-RAY (DRAPES) IMPLANT
ELECT REM PT RETURN 15FT ADLT (MISCELLANEOUS) ×1 IMPLANT
GLOVE BIO SURGEON STRL SZ7 (GLOVE) ×1 IMPLANT
GLOVE BIOGEL PI IND STRL 7.5 (GLOVE) ×1 IMPLANT
GOWN STRL REUS W/ TWL LRG LVL3 (GOWN DISPOSABLE) ×1 IMPLANT
GOWN STRL REUS W/TWL LRG LVL3 (GOWN DISPOSABLE) ×1
GRASPER SUT TROCAR 14GX15 (MISCELLANEOUS) IMPLANT
HEMOSTAT SNOW SURGICEL 2X4 (HEMOSTASIS) IMPLANT
IRRIG SUCT STRYKERFLOW 2 WTIP (MISCELLANEOUS) ×1
IRRIGATION SUCT STRKRFLW 2 WTP (MISCELLANEOUS) ×1 IMPLANT
KIT BASIN OR (CUSTOM PROCEDURE TRAY) ×1 IMPLANT
KIT TURNOVER KIT A (KITS) IMPLANT
PENCIL SMOKE EVACUATOR (MISCELLANEOUS) IMPLANT
POUCH RETRIEVAL ECOSAC 10 (ENDOMECHANICALS) ×1 IMPLANT
PROTECTOR NERVE ULNAR (MISCELLANEOUS) IMPLANT
SCISSORS LAP 5X35 DISP (ENDOMECHANICALS) ×1 IMPLANT
SET CHOLANGIOGRAPH MIX (MISCELLANEOUS) IMPLANT
SET TUBE SMOKE EVAC HIGH FLOW (TUBING) ×1 IMPLANT
SLEEVE Z-THREAD 5X100MM (TROCAR) ×2 IMPLANT
SPIKE FLUID TRANSFER (MISCELLANEOUS) ×1 IMPLANT
STRIP CLOSURE SKIN 1/2X4 (GAUZE/BANDAGES/DRESSINGS) IMPLANT
SUT MNCRL AB 4-0 PS2 18 (SUTURE) ×1 IMPLANT
SUT VICRYL 0 TIES 12 18 (SUTURE) IMPLANT
SUT VICRYL 0 UR6 27IN ABS (SUTURE) IMPLANT
TOWEL OR 17X26 10 PK STRL BLUE (TOWEL DISPOSABLE) ×1 IMPLANT
TOWEL OR NON WOVEN STRL DISP B (DISPOSABLE) ×1 IMPLANT
TRAY LAPAROSCOPIC (CUSTOM PROCEDURE TRAY) ×1 IMPLANT
TROCAR 11X100 Z THREAD (TROCAR) IMPLANT
TROCAR BALLN 12MMX100 BLUNT (TROCAR) ×1 IMPLANT
TROCAR Z-THREAD OPTICAL 5X100M (TROCAR) ×1 IMPLANT

## 2023-04-15 NOTE — Plan of Care (Signed)
?  Problem: Clinical Measurements: ?Goal: Will remain free from infection ?Outcome: Progressing ?  ?

## 2023-04-15 NOTE — Transfer of Care (Addendum)
Immediate Anesthesia Transfer of Care Note  Patient: Ann Foster  Procedure(s) Performed: LAPAROSCOPIC CHOLECYSTECTOMY WITH ICG DYE (Abdomen)  Patient Location: PACU  Anesthesia Type:General  Level of Consciousness: awake and patient cooperative  Airway & Oxygen Therapy: Patient Spontanous Breathing and Patient connected to nasal cannula oxygen  Post-op Assessment: Report given to RN and Post -op Vital signs reviewed and stable  Post vital signs: Reviewed and stable  Last Vitals:  Vitals Value Taken Time  BP 139/77 04/15/23 1339  Temp 36.7 04/15/23   1343  Pulse 67 04/15/23 1343  Resp 17 04/15/23 1343  SpO2 98 % 04/15/23 1343  Vitals shown include unfiled device data.  Last Pain:  Vitals:   04/15/23 0900  TempSrc:   PainSc: 0-No pain         Complications: No notable events documented.

## 2023-04-15 NOTE — Anesthesia Procedure Notes (Signed)
Procedure Name: Intubation Date/Time: 04/15/2023 12:21 PM  Performed by: Oletha Cruel, CRNAPre-anesthesia Checklist: Patient identified, Emergency Drugs available, Suction available and Patient being monitored Patient Re-evaluated:Patient Re-evaluated prior to induction Oxygen Delivery Method: Circle system utilized Preoxygenation: Pre-oxygenation with 100% oxygen Induction Type: IV induction Ventilation: Mask ventilation without difficulty Laryngoscope Size: Mac and 4 Grade View: Grade II Tube type: Oral Tube size: 7.0 mm Number of attempts: 1 Airway Equipment and Method: Stylet Placement Confirmation: ETT inserted through vocal cords under direct vision, positive ETCO2, CO2 detector and breath sounds checked- equal and bilateral Secured at: 21 cm Tube secured with: Tape Dental Injury: Teeth and Oropharynx as per pre-operative assessment  Comments: Atraumatic intubation. Lips remain in preoperative condition. Patient is edentulous.

## 2023-04-15 NOTE — Op Note (Signed)
Preoperative diagnosis: Acute cholecystitis Postoperative diagnosis: Same as above Procedure: Laparoscopic cholecystectomy Surgeon: Dr. Harden Mo Anesthesia: General Estimated blood loss: Minimal Complications: None Drains: None Specimens: Gallbladder and contents to pathology Sponge needle count was correct at completion Disposition recovery in stable condition  Indications: This is a 57 year old female who was seen in our office and directly admitted for what was thought to be cholecystitis.  She underwent an ultrasound that showed her to have cholelithiasis with findings consistent with cholecystitis.  They reported a 1.9 cm gallstone in the common duct.  However her LFTs are normal.  She then underwent an MRCP that did not show any evidence of choledocholithiasis.  I do not think the ultrasound was obviously correct.  She never had a dilated duct so I doubt that there was a stone present.  We discussed proceeding with laparoscopic cholecystectomy for which what was acute cholecystitis.  Procedure: After informed consent was obtained she was taken to the operating room.  She was given antibiotics.  SCDs were in place.  She was given ICG dye.  She was placed under general anesthesia without complication.  She was prepped and draped in a standard sterile surgical fashion.  A surgical timeout was then performed.  I infiltrated Marcaine below her umbilicus.  I made a vertical incision.  I grasped the fascia and incised this sharply.  I entered the peritoneum bluntly.  This was done without injury.  I placed a 0 Vicryl pursestring suture through the fascia and inserted a Hassan trocar.  I insufflated the abdomen to 15 mmHg pressure.  I then inserted 3 additional 5 mm trocars in the epigastrium and right upper quadrant without difficulty.  The gallbladder was noted to have acute cholecystitis.  There was some of the omental fat that was adherent to the liver that I had to take down.  The  gallbladder was then retracted cephalad and lateral.  I was able to dissect in the triangle and clearly obtained a wide critical view of safety.  I did use the ICG dye but due to the amount of fat she had I was really unable to see any dye present.  I was confident with a wide critical view of safety that I had the artery and duct.  This was a wide window again.  I then clipped the artery and divided it leaving 2 clips in place.  I clipped the duct 3 times and divided it leaving 2 clips in place.  The clips completely traverse the duct and the duct was viable.  I then remove the gallbladder from the liver bed.  This was placed in retrieval bag and removed.  I obtained hemostasis.  Irrigation was performed.  I then removed the [facility] trocar and tied the pursestring down.  I placed 2 additional 0 Vicryl sutures through the fascia and completely obliterated that defect.  The remaining trocars were then removed and the abdomen desufflated.  These were closed with 4 Monocryl and glue.  She tolerated it well was extubated and transferred recovery stable.

## 2023-04-15 NOTE — Discharge Instructions (Signed)

## 2023-04-15 NOTE — Progress Notes (Signed)
Patient ID: Ann Foster, female   DOB: [birth year], 57 y.o.   MRN: [id number] I have seen patient, reviewed all labs and studies. Agree with proceeding with lap chole today. I told her I would end up doing it due to scheduling. She is agreeable and does not have any additional questions

## 2023-04-15 NOTE — Anesthesia Postprocedure Evaluation (Signed)
Anesthesia Post Note  Patient: Ann Foster  Procedure(s) Performed: LAPAROSCOPIC CHOLECYSTECTOMY WITH ICG DYE (Abdomen)     Patient location during evaluation: PACU Anesthesia Type: General Level of consciousness: awake and alert Pain management: pain level controlled Vital Signs Assessment: post-procedure vital signs reviewed and stable Respiratory status: spontaneous breathing, nonlabored ventilation, respiratory function stable and patient connected to nasal cannula oxygen Cardiovascular status: blood pressure returned to baseline and stable Postop Assessment: no apparent nausea or vomiting Anesthetic complications: no  No notable events documented.  Last Vitals:  Vitals:   04/15/23 1421 04/15/23 1430  BP: 136/77 120/79  Pulse: 71 (!) 58  Resp: 18 18  Temp:    SpO2: 97% 99%    Last Pain:  Vitals:   04/15/23 1432  TempSrc:   PainSc: 0-No pain                 Shelton Silvas

## 2023-04-15 NOTE — Interval H&P Note (Signed)
History and Physical Interval Note:  04/15/2023 7:50 AM  Ann Foster  has presented today for surgery, with the diagnosis of CHOLELITHIASIS.  The various methods of treatment have been discussed with the patient and family. After consideration of risks, benefits and other options for treatment, the patient has consented to  Procedure(s): LAPAROSCOPIC CHOLECYSTECTOMY WITH INTRAOPERATIVE CHOLANGIOGRAM (N/A) as a surgical intervention.  The patient's history has been reviewed, patient examined, no change in status, stable for surgery.  I have reviewed the patient's chart and labs.  Questions were answered to the patient's satisfaction.    The anatomy & physiology of hepatobiliary & pancreatic function was discussed.  The pathophysiology of gallbladder dysfunction was discussed.  Natural history risks without surgery was discussed.   I feel the risks of no intervention will lead to serious problems that outweigh the operative risks; therefore, I recommended cholecystectomy to remove the pathology.  I explained laparoscopic techniques with possible need for an open approach.  Probable cholangiogram to evaluate the bilary tract was explained as well.    Risks such as bleeding, infection, diarrhea and other bowel changes, abscess, leak, injury to other organs, need for repair of tissues / organs, need for further treatment, stroke, heart attack, death, and other risks were discussed.  I noted a good likelihood this will help address the problem, but there is a chance it may not help.  Possibility that this will not correct all abdominal symptoms was explained.  Goals of post-operative recovery were discussed as well.  We will work to minimize complications.  An educational handout further explaining the pathology and treatment options was given as well.  Questions were answered.  The patient expresses understanding & wishes to proceed with surgery.    I have re-reviewed the the patient's records,  history, medications, and allergies.  I have re-examined the patient.  I again discussed intraoperative plans and goals of post-operative recovery.  The patient agrees to proceed.  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   Influenza vaccine refused 07/29/2020   COVID-19 vaccine dose declined 07/29/2020   Vasomotor symptoms due to menopause 04/24/2020   Atrophic vaginitis 04/24/2020   Impingement syndrome of left shoulder 01/23/2018    Past Medical History:  Diagnosis Date   Ankle fracture 2016   Right   Aortic atherosclerosis (HCC)    trace calcific atherosclerosis aortic arch per ct neck done 12-22-17   Bronchitis    Diabetes mellitus without complication (HCC)    Hypertension    OA (osteoarthritis) of shoulder    left shoulder, both knees arthritis   Osteoarthritis, knee     Past Surgical History:  Procedure Laterality Date   COLONOSCOPY     NO PAST SURGERIES      Social History   Socioeconomic History   Marital status: Single    Spouse name: Not on file   Number of children: 3   Years of education: Not on file   Highest education level: High school graduate  Occupational History   Not on file  Tobacco Use  Smoking status: Former    Current packs/day: 0.00    Types: Cigarettes    Quit date: 11/28/2019    Years since quitting: 3.3   Smokeless tobacco: Never   Tobacco comments:    smokes 1-2 cigarettes day now  Vaping Use   Vaping status: Never Used  Substance and Sexual Activity   Alcohol use: Not  Currently    Comment: Occasional   Drug use: No   Sexual activity: Not Currently    Birth control/protection: Condom  Other Topics Concern   Not on file  Social History Narrative   Left Handed   One story home    Social Determinants of Health   Financial Resource Strain: Low Risk  (03/01/2023)   Overall Financial Resource Strain (CARDIA)    Difficulty of Paying Living Expenses: Not hard at all  Food Insecurity: No Food Insecurity (04/13/2023)   Hunger Vital Sign    Worried About Running Out of Food in the Last Year: Never true    Ran Out of Food in the Last Year: Never true  Transportation Needs: No Transportation Needs (04/13/2023)   PRAPARE - Administrator, Civil Service (Medical): No    Lack of Transportation (Non-Medical): No  Physical Activity: Inactive (03/01/2023)   Exercise Vital Sign    Days of Exercise per Week: 0 days    Minutes of Exercise per Session: 0 min  Stress: No Stress Concern Present (03/01/2023)   [employer] of Occupational Health - Occupational Stress Questionnaire    Feeling of Stress : Not at all  Social Connections: Moderately Isolated (03/01/2023)   Social Connection and Isolation Panel [NHANES]    Frequency of Communication with Friends and Family: More than three times a week    Frequency of Social Gatherings with Friends and Family: Three times a week    Attends Religious Services: 1 to 4 times per year    Active Member of Clubs or Organizations: No    Attends Banker Meetings: Never    Marital Status: Never married  Intimate Partner Violence: Not At Risk (04/13/2023)   Humiliation, Afraid, Rape, and Kick questionnaire    Fear of Current or Ex-Partner: No    Emotionally Abused: No    Physically Abused: No    Sexually Abused: No    Family History  Problem Relation Age of Onset   Breast cancer Mother    Colon cancer Father 65   Diabetes Son    Esophageal cancer Neg Hx    Stomach cancer Neg Hx    Liver  disease Neg Hx    Pancreatic cancer Neg Hx    Rectal cancer Neg Hx     Medications Prior to Admission  Medication Sig Dispense Refill Last Dose   albuterol (VENTOLIN HFA) 108 (90 Base) MCG/ACT inhaler Inhale 2 puffs into the lungs every 6 (six) hours as needed for wheezing or shortness of breath. 18 g 2 unknown   atorvastatin (LIPITOR) 80 MG tablet Take 1 tablet (80 mg total) by mouth daily. 90 tablet 1 04/05/2023   buPROPion (WELLBUTRIN SR) 150 MG 12 hr tablet Take 1 tablet (150 mg total) by mouth 2 (two) times daily. 60 tablet 2 03/31/2023   cyclobenzaprine (FLEXERIL) 5 MG tablet Take 1 tablet (5 mg total) by mouth at bedtime as needed for muscle spasms. 30 tablet 1 unknown   dapagliflozin propanediol (FARXIGA) 5 MG TABS tablet TAKE 1 TABLET BY MOUTH ONCE  DAILY BEFORE BREAKFAST 100 tablet 1  04/05/2023   Dulaglutide (TRULICITY) 1.5 MG/0.5ML SOPN Inject 1.5 mg into the skin once a week. 6 mL 1 04/03/2023   estradiol (ESTRACE) 0.1 MG/GM vaginal cream Apply 1 gram per vagina every night for 2 weeks, then apply three times a week 42.5 g 12 04/05/2023   estradiol (VIVELLE-DOT) 0.0375 MG/24HR PLACE 1 PATCH ONTO THE SKIN 2 (TWO) TIMES A WEEK. 8 patch 12 04/05/2023   fluticasone (FLONASE) 50 MCG/ACT nasal spray Place 1 spray into both nostrils 2 (two) times daily. (Patient taking differently: Place 1 spray into both nostrils 2 (two) times daily as needed for rhinitis.) 16 g 1 unknown   montelukast (SINGULAIR) 10 MG tablet Take 1 tablet (10 mg total) by mouth at bedtime. 30 tablet 3 04/05/2023   omeprazole (PRILOSEC) 40 MG capsule Take 1 capsule (40 mg total) by mouth daily. 90 capsule 3 04/05/2023   ondansetron (ZOFRAN-ODT) 4 MG disintegrating tablet Take 1 tablet (4 mg total) by mouth every 8 (eight) hours as needed for vomiting or nausea. 8 tablet 0 unknown   Prasterone (INTRAROSA) 6.5 MG INST Insert one capsule vaginally 28 each 3 04/05/2023   progesterone (PROMETRIUM) 200 MG capsule TAKE 1 CAPSULE (200 MG  TOTAL) BY MOUTH DAILY. 30 capsule 3 04/05/2023   promethazine-dextromethorphan (PROMETHAZINE-DM) 6.25-15 MG/5ML syrup Take 5 mLs by mouth 4 (four) times daily as needed for cough. 240 mL 0 04/05/2023   ramipril (ALTACE) 2.5 MG capsule TAKE 1 CAPSULE(2.5 MG) BY MOUTH DAILY 90 capsule 0 04/05/2023   Accu-Chek Softclix Lancets lancets Check blood glucose level by fingerstick once per day. E11.65 100 each 2    Blood Glucose Monitoring Suppl (ACCU-CHEK GUIDE) w/Device KIT Check blood glucose level by fingerstick once per day. E11.65 1 kit 0    cyclopentolate (CYCLODRYL,CYCLOGYL) 1 % ophthalmic solution  (Patient not taking: Reported on 04/14/2023)   Not Taking   glucose blood (ACCU-CHEK GUIDE) test strip Check blood glucose level by fingerstick once per day. E11.65 100 each 2    HYDROcodone-acetaminophen (NORCO/VICODIN) 5-325 MG tablet Take 1 tablet by mouth every 6 (six) hours as needed. (Patient not taking: Reported on 04/14/2023) 5 tablet 0 Not Taking   nortriptyline (PAMELOR) 10 MG capsule TAKE 1 CAPSULE BY MOUTH EVERY NIGHT AT BEDTIME FOR 2 WEEKS THEN INCREASE TO 2 CAPSULES EVERY NIGHT AT BEDTIME (Patient not taking: Reported on 04/14/2023) 60 capsule 5 Not Taking   prednisoLONE acetate (PRED FORTE) 1 % ophthalmic suspension Place 1 drop into the left eye 4 (four) times daily. (Patient not taking: Reported on 04/14/2023)   Not Taking    Current Facility-Administered Medications  Medication Dose Route Frequency Provider Last Rate Last Admin   acetaminophen (TYLENOL) tablet 1,000 mg  1,000 mg Oral Q6H Gaynelle Adu, MD   1,000 mg at 04/15/23 0042   bupivacaine liposome (EXPAREL) 1.3 % injection 266 mg  20 mL Infiltration Once Karie Soda, MD       cefTRIAXone (ROCEPHIN) 2 g in sodium chloride 0.9 % 100 mL IVPB  2 g Intravenous Q24H Gaynelle Adu, MD 200 mL/hr at 04/14/23 1758 2 g at 04/14/23 1758   cefTRIAXone (ROCEPHIN) 2 g in sodium chloride 0.9 % 100 mL IVPB  2 g Intravenous On Call to OR Karie Soda, MD       Chlorhexidine Gluconate Cloth 2 % PADS 6 each  6 each Topical Once Karie Soda, MD       And   Chlorhexidine Gluconate Cloth 2 % PADS 6 each  6 each Topical Once Karie Soda, MD       diphenhydrAMINE (BENADRYL) 12.5 MG/5ML elixir 12.5 mg  12.5 mg Oral Q6H PRN Gaynelle Adu, MD       Or   diphenhydrAMINE (BENADRYL) injection 12.5 mg  12.5 mg Intravenous Q6H PRN Gaynelle Adu, MD       enoxaparin (LOVENOX) injection 40 mg  40 mg Subcutaneous Q24H Gaynelle Adu, MD   40 mg at 04/14/23 2018   feeding supplement (ENSURE PRE-SURGERY) liquid 296 mL  296 mL Oral Once Karie Soda, MD       gabapentin (NEURONTIN) capsule 300 mg  300 mg Oral On Call to OR Karie Soda, MD       insulin aspart (novoLOG) injection 0-15 Units  0-15 Units Subcutaneous Q4H Gaynelle Adu, MD   3 Units at 04/15/23 0513   melatonin tablet 3 mg  3 mg Oral QHS PRN Gaynelle Adu, MD       morphine (PF) 2 MG/ML injection 1-2 mg  1-2 mg Intravenous Q2H PRN Gaynelle Adu, MD       ondansetron (ZOFRAN-ODT) disintegrating tablet 4 mg  4 mg Oral Q6H PRN Gaynelle Adu, MD       Or   ondansetron [facility]) injection 4 mg  4 mg Intravenous Q6H PRN Gaynelle Adu, MD       oxyCODONE (Oxy IR/ROXICODONE) immediate release tablet 5-10 mg  5-10 mg Oral Q4H PRN Gaynelle Adu, MD   10 mg at 04/13/23 2259   pantoprazole (PROTONIX) injection 40 mg  40 mg Intravenous QHS Gaynelle Adu, MD   40 mg at 04/14/23 2018     No Known Allergies  BP 120/68 (BP Location: Right Arm)   Pulse 62   Temp 98.4 F (36.9 C) (Oral)   Resp 16   Ht 5\' 3"  (1.6 m)   Wt 88.5 kg   SpO2 100%   BMI 34.54 kg/m   Labs: Results for orders placed or performed during the hospital encounter of 04/13/23 (from the past 48 hour(s))  Glucose, capillary     Status: Abnormal   Collection Time: 04/13/23  4:22 PM  Result Value Ref Range   Glucose-Capillary 255 (H) 70 - 99 mg/dL    Comment: Glucose reference range applies only to samples taken after fasting for at  least 8 hours.  HIV Antibody (routine testing w rflx)     Status: None   Collection Time: 04/13/23  6:39 PM  Result Value Ref Range   HIV Screen 4th Generation wRfx Non Reactive Non Reactive    Comment: Performed at [facility], [address], [place], [place] [address]  Comprehensive metabolic panel     Status: Abnormal   Collection Time: 04/13/23  6:39 PM  Result Value Ref Range   Sodium 138 135 - 145 mmol/L   Potassium 4.6 3.5 - 5.1 mmol/L   Chloride 107 98 - 111 mmol/L   CO2 23 22 - 32 mmol/L   Glucose, Bld 193 (H) 70 - 99 mg/dL    Comment: Glucose reference range applies only to samples taken after fasting for at least 8 hours.   BUN 19 6 - 20 mg/dL   Creatinine, Ser 6.04 (H) 0.44 - 1.00 mg/dL   Calcium 9.6 8.9 - 54.0 mg/dL   Total Protein 7.7 6.5 - 8.1 g/dL   Albumin 4.1 3.5 - 5.0 g/dL   AST 17 15 - 41 U/L   ALT 36 0 - 44 U/L   Alkaline Phosphatase  69 38 - 126 U/L   Total Bilirubin 0.4 <1.2 mg/dL   GFR, Estimated >60 >45 mL/min    Comment: (NOTE) Calculated using the CKD-EPI Creatinine Equation (2021)    Anion gap 8 5 - 15    Comment: Performed at [facility], [address], [place], [place] [address]  CBC WITH DIFFERENTIAL     Status: None   Collection Time: 04/13/23  6:39 PM  Result Value Ref Range   WBC 8.9 4.0 - 10.5 K/uL   RBC 4.04 3.87 - 5.11 MIL/uL   Hemoglobin 13.0 12.0 - 15.0 g/dL   HCT 19.1 47.8 - 29.5 %   MCV 97.5 80.0 - 100.0 fL   MCH 32.2 26.0 - 34.0 pg   MCHC 33.0 30.0 - 36.0 g/dL   RDW 62.1 30.8 - 65.7 %   Platelets 310 150 - 400 K/uL   nRBC 0.0 0.0 - 0.2 %   Neutrophils Relative % 49 %   Neutro Abs 4.4 1.7 - 7.7 K/uL   Lymphocytes Relative 40 %   Lymphs Abs 3.6 0.7 - 4.0 K/uL   Monocytes Relative 8 %   Monocytes Absolute 0.7 0.1 - 1.0 K/uL   Eosinophils Relative 2 %   Eosinophils Absolute 0.2 0.0 - 0.5 K/uL   Basophils Relative 1 %   Basophils Absolute 0.0 0.0 - 0.1 K/uL   Immature Granulocytes 0 %   Abs Immature  Granulocytes 0.02 0.00 - 0.07 K/uL    Comment: Performed at [facility], [address] [place], [place] [address]  Glucose, capillary     Status: Abnormal   Collection Time: 04/13/23  8:30 PM  Result Value Ref Range   Glucose-Capillary 146 (H) 70 - 99 mg/dL    Comment: Glucose reference range applies only to samples taken after fasting for at least 8 hours.  Glucose, capillary     Status: Abnormal   Collection Time: 04/14/23 12:29 AM  Result Value Ref Range   Glucose-Capillary 167 (H) 70 - 99 mg/dL    Comment: Glucose reference range applies only to samples taken after fasting for at least 8 hours.  CBC     Status: None   Collection Time: 04/14/23  4:24 AM  Result Value Ref Range   WBC 7.2 4.0 - 10.5 K/uL   RBC 3.87 3.87 - 5.11 MIL/uL   Hemoglobin 12.1 12.0 - 15.0 g/dL   HCT 29.5 28.4 - 13.2 %   MCV 97.4 80.0 - 100.0 fL   MCH 31.3 26.0 - 34.0 pg   MCHC 32.1 30.0 - 36.0 g/dL   RDW 44.0 10.2 - 72.5 %   Platelets 294 150 - 400 K/uL   nRBC 0.0 0.0 - 0.2 %    Comment: Performed at [facility], [address] [place], [place] [address]  Comprehensive metabolic panel     Status: Abnormal   Collection Time: 04/14/23  4:24 AM  Result Value Ref Range   Sodium 135 135 - 145 mmol/L   Potassium 3.8 3.5 - 5.1 mmol/L   Chloride 104 98 - 111 mmol/L   CO2 25 22 - 32 mmol/L   Glucose, Bld 149 (H) 70 - 99 mg/dL    Comment: Glucose reference range applies only to samples taken after fasting for at least 8 hours.   BUN 13 6 - 20 mg/dL   Creatinine, Ser 0.34 0.44 - 1.00 mg/dL   Calcium 8.8 (L) 8.9 - 10.3 mg/dL   Total  Protein 6.9 6.5 - 8.1 g/dL   Albumin 3.6 3.5 - 5.0 g/dL   AST 15 15 - 41 U/L   ALT 32 0 - 44 U/L   Alkaline Phosphatase 60 38 - 126 U/L   Total Bilirubin 0.6 <1.2 mg/dL   GFR, Estimated >64 >40 mL/min    Comment: (NOTE) Calculated using the CKD-EPI Creatinine Equation (2021)    Anion gap 6 5 - 15    Comment: Performed at [facility], [address] [place], [place] [address]  Lipase, blood     Status: Abnormal   Collection Time: 04/14/23  4:24 AM  Result Value Ref Range   Lipase 56 (H) 11 - 51 U/L    Comment: Performed at [facility], [address] [place], [place] [address]  Glucose, capillary     Status: Abnormal   Collection Time: 04/14/23  4:34 AM  Result Value Ref Range   Glucose-Capillary 151 (H) 70 - 99 mg/dL    Comment: Glucose reference range applies only to samples taken after fasting for at least 8 hours.  Surgical PCR screen     Status: None   Collection Time: 04/14/23  6:21 AM   Specimen: Nasal Mucosa; Nasal Swab  Result Value Ref Range   MRSA, PCR NEGATIVE NEGATIVE   Staphylococcus aureus NEGATIVE NEGATIVE    Comment: (NOTE) The Xpert SA Assay (FDA approved for NASAL specimens in patients 59 years of age and older), is one component of a comprehensive surveillance program. It is not intended to diagnose infection nor to guide or monitor treatment. Performed at [facility], [address], [place], [place] [address]   Glucose, capillary     Status: Abnormal   Collection Time: 04/14/23  7:42 AM  Result Value Ref Range   Glucose-Capillary 160 (H) 70 - 99 mg/dL    Comment: Glucose reference range applies only to samples taken after fasting for at least 8 hours.  Glucose, capillary     Status: Abnormal   Collection Time: 04/14/23 11:46 AM  Result Value Ref Range   Glucose-Capillary 172 (H) 70 - 99 mg/dL    Comment: Glucose reference range applies only to samples taken after fasting for at least 8 hours.  Glucose, capillary     Status: Abnormal   Collection Time: 04/14/23  3:35 PM  Result Value Ref Range   Glucose-Capillary 174 (H) 70 - 99 mg/dL    Comment: Glucose reference range applies only to samples taken after fasting for at least 8 hours.  Glucose, capillary     Status: Abnormal   Collection Time: 04/14/23  7:59 PM  Result  Value Ref Range   Glucose-Capillary 181 (H) 70 - 99 mg/dL    Comment: Glucose reference range applies only to samples taken after fasting for at least 8 hours.  Glucose, capillary     Status: Abnormal   Collection Time: 04/14/23 11:58 PM  Result Value Ref Range   Glucose-Capillary 179 (H) 70 - 99 mg/dL    Comment: Glucose reference range applies only to samples taken after fasting for at least 8 hours.  Glucose, capillary     Status: Abnormal   Collection Time: 04/15/23  3:59 AM  Result Value Ref Range   Glucose-Capillary 164 (H) 70 - 99 mg/dL    Comment: Glucose reference range applies only to samples taken after fasting for at least 8 hours.  Comprehensive metabolic panel     Status: Abnormal  Collection Time: 04/15/23  5:41 AM  Result Value Ref Range   Sodium 132 (L) 135 - 145 mmol/L   Potassium 3.9 3.5 - 5.1 mmol/L   Chloride 104 98 - 111 mmol/L   CO2 21 (L) 22 - 32 mmol/L   Glucose, Bld 175 (H) 70 - 99 mg/dL    Comment: Glucose reference range applies only to samples taken after fasting for at least 8 hours.   BUN 12 6 - 20 mg/dL   Creatinine, Ser 4.69 0.44 - 1.00 mg/dL   Calcium 9.0 8.9 - 62.9 mg/dL   Total Protein 7.0 6.5 - 8.1 g/dL   Albumin 3.7 3.5 - 5.0 g/dL   AST 15 15 - 41 U/L   ALT 29 0 - 44 U/L   Alkaline Phosphatase 62 38 - 126 U/L   Total Bilirubin 0.7 <1.2 mg/dL   GFR, Estimated >52 >84 mL/min    Comment: (NOTE) Calculated using the CKD-EPI Creatinine Equation (2021)    Anion gap 7 5 - 15    Comment: Performed at [facility], [address], [place], [place] [address]  Glucose, capillary     Status: Abnormal   Collection Time: 04/15/23  7:33 AM  Result Value Ref Range   Glucose-Capillary 172 (H) 70 - 99 mg/dL    Comment: Glucose reference range applies only to samples taken after fasting for at least 8 hours.   Comment 1 Notify RN     Imaging / Studies: MR ABDOMEN MRCP W WO CONTAST  Result Date: 04/14/2023 CLINICAL DATA:  Evaluate  for choledocholithiasis.  Gallstone. EXAM: MRI ABDOMEN WITHOUT AND WITH CONTRAST (INCLUDING MRCP) TECHNIQUE: Multiplanar multisequence MR imaging of the abdomen was performed both before and after the administration of intravenous contrast. Heavily T2-weighted images of the biliary and pancreatic ducts were obtained, and three-dimensional MRCP images were rendered by post processing. CONTRAST:  8mL GADAVIST GADOBUTROL 1 MMOL/ML IV SOLN COMPARISON:  Ultrasound of 04/13/2023.  CT of 04/06/2023 FINDINGS: Lower chest: Normal heart size without pericardial or pleural effusion. Hepatobiliary: Mild hepatomegaly at 18 cm craniocaudal. Gallstones up to 1.4 cm. No specific evidence of acute cholecystitis. No intrahepatic biliary duct dilatation. Evaluation of the common duct is somewhat challenging secondary to descending duodenal diverticulax2. No biliary duct dilatation or choledocholithiasis, given this mild limitation. Pancreas:  Normal, without mass or ductal dilatation. Spleen:  Normal in size, without focal abnormality. Adrenals/Urinary Tract: Normal adrenal glands. Pelvic right kidney. Tiny left renal nonenhancing lesions are consistent with cysts and minimally complex cysts. Stomach/Bowel: Normal stomach and abdominal bowel loops. Vascular/Lymphatic: Aortic atherosclerosis. Retroaortic left renal vein. No retroperitoneal or retrocrural adenopathy. Other:  No ascites. Musculoskeletal: No acute osseous abnormality. IMPRESSION: 1. Cholelithiasis. 2. Evaluation for choledocholithiasis is mildly limited secondary to descending duodenal diverticula x2. Given this limitation, no biliary duct dilatation or choledocholithiasis. 3.  Aortic Atherosclerosis (ICD10-I70.0). Electronically Signed   By: Jeronimo Greaves M.D.   On: 04/14/2023 15:59   MR 3D Recon At Scanner  Result Date: 04/14/2023 CLINICAL DATA:  Evaluate for choledocholithiasis.  Gallstone. EXAM: MRI ABDOMEN WITHOUT AND WITH CONTRAST (INCLUDING MRCP) TECHNIQUE:  Multiplanar multisequence MR imaging of the abdomen was performed both before and after the administration of intravenous contrast. Heavily T2-weighted images of the biliary and pancreatic ducts were obtained, and three-dimensional MRCP images were rendered by post processing. CONTRAST:  8mL GADAVIST GADOBUTROL 1 MMOL/ML IV SOLN COMPARISON:  Ultrasound of 04/13/2023.  CT of 04/06/2023 FINDINGS: Lower chest: Normal heart size without  pericardial or pleural effusion. Hepatobiliary: Mild hepatomegaly at 18 cm craniocaudal. Gallstones up to 1.4 cm. No specific evidence of acute cholecystitis. No intrahepatic biliary duct dilatation. Evaluation of the common duct is somewhat challenging secondary to descending duodenal diverticulax2. No biliary duct dilatation or choledocholithiasis, given this mild limitation. Pancreas:  Normal, without mass or ductal dilatation. Spleen:  Normal in size, without focal abnormality. Adrenals/Urinary Tract: Normal adrenal glands. Pelvic right kidney. Tiny left renal nonenhancing lesions are consistent with cysts and minimally complex cysts. Stomach/Bowel: Normal stomach and abdominal bowel loops. Vascular/Lymphatic: Aortic atherosclerosis. Retroaortic left renal vein. No retroperitoneal or retrocrural adenopathy. Other:  No ascites. Musculoskeletal: No acute osseous abnormality. IMPRESSION: 1. Cholelithiasis. 2. Evaluation for choledocholithiasis is mildly limited secondary to descending duodenal diverticula x2. Given this limitation, no biliary duct dilatation or choledocholithiasis. 3.  Aortic Atherosclerosis (ICD10-I70.0). Electronically Signed   By: Jeronimo Greaves M.D.   On: 04/14/2023 15:59   US ABDOMEN LIMITED RUQ (LIVER/GB)  Result Date: 04/13/2023 CLINICAL DATA:  Postprandial pain. EXAM: ULTRASOUND ABDOMEN LIMITED RIGHT UPPER QUADRANT COMPARISON:  None Available. FINDINGS: Gallbladder: A 1.4 cm gallstone is seen within the neck of the gallbladder. There is evidence of  gallbladder wall thickening (5.7 mm). A positive sonographic Eulah Pont sign was noted by sonographer. Common bile duct: Diameter: 23.1 mm A 1.9 cm gallstone is seen within the common bile duct at the level of the head of the pancreas. Liver: No focal lesion identified. Diffusely increased echogenicity of the liver parenchyma is noted. Portal vein is patent on color Doppler imaging with normal direction of blood flow towards the liver. Other: None. IMPRESSION: 1. Cholelithiasis with additional findings consistent with acute cholecystitis. 2. 1.9 cm gallstone within the common bile duct. Electronically Signed   By: Aram Candela M.D.   On: 04/13/2023 20:39   CT ABDOMEN PELVIS W CONTRAST  Result Date: 04/06/2023 CLINICAL DATA:  Upper abdominal pain, nausea EXAM: CT ABDOMEN AND PELVIS WITH CONTRAST TECHNIQUE: Multidetector CT imaging of the abdomen and pelvis was performed using the standard protocol following bolus administration of intravenous contrast. RADIATION DOSE REDUCTION: This exam was performed according to the departmental dose-optimization program which includes automated exposure control, adjustment of the mA and/or kV according to patient size and/or use of iterative reconstruction technique. CONTRAST:  OMNIPAQUE IOHEXOL 300 MG/ML  SOLN COMPARISON:  None Available. FINDINGS: Lower chest: No acute abnormality Hepatobiliary: 1.5 cm gallstone within the gallbladder. No wall thickening or biliary ductal dilatation. No focal hepatic abnormality. Pancreas: No focal abnormality or ductal dilatation. Small duodenal diverticula in the region of the pancreatic head. Spleen: No focal abnormality.  Normal size. Adrenals/Urinary Tract: Adrenal glands normal. 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 No renal mass, stones or hydronephrosis. Urinary bladder unremarkable. Stomach/Bowel: Normal appendix. Stomach, large and small bowel grossly unremarkable. Vascular/Lymphatic: No evidence of aneurysm or  adenopathy. Reproductive: Uterus and adnexa unremarkable.  No mass. Other: No free fluid or free air. Musculoskeletal: No acute bony abnormality. IMPRESSION: Cholelithiasis.  No CT evidence of acute cholecystitis. 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  No hydronephrosis. No acute findings. Electronically Signed   By: Charlett Nose M.D.   On: 04/06/2023 01:01     .Ardeth Sportsman, M.D., F.A.C.S. Gastrointestinal and Minimally Invasive Surgery [facility] [address] [place], [place] [address] [phone] Main / Paging  04/15/2023 7:50 AM     Ardeth Sportsman

## 2023-04-16 ENCOUNTER — Encounter (HOSPITAL_COMMUNITY): Payer: Self-pay | Admitting: General Surgery

## 2023-04-16 DIAGNOSIS — K8012 Calculus of gallbladder with acute and chronic cholecystitis without obstruction: Secondary | ICD-10-CM | POA: Diagnosis not present

## 2023-04-16 LAB — GLUCOSE, CAPILLARY
Glucose-Capillary: 133 mg/dL — ABNORMAL HIGH (ref 70–99)
Glucose-Capillary: 164 mg/dL — ABNORMAL HIGH (ref 70–99)
Glucose-Capillary: 242 mg/dL — ABNORMAL HIGH (ref 70–99)

## 2023-04-16 NOTE — Progress Notes (Signed)
Reviewed written d/c instructions w pt and all questions answered. She verbalized understanding. D/C via w/c w all belongings in stable condition. 

## 2023-04-16 NOTE — Plan of Care (Signed)

## 2023-04-16 NOTE — Discharge Summary (Signed)
Physician Discharge Summary  Patient ID: Ann Foster MRN: [id number] DOB/AGE: 12-31-1965 57 y.o.  Admit date: 04/13/2023 Discharge date: 04/16/2023  Admission Diagnoses: DM Cholecystitis  Discharge Diagnoses:  Principal Problem:   Acute cholecystitis   Discharged Condition: good  Hospital Course: 57 yof admitted from office with likely cholecystitis.  On [place] was read as possible 1.9 cm stone in CBD despite normal labs. She had an MRCP before I saw her that was negative. I did a lap chole on her that showed cholecystitis with small cystic duct.  The 1.9 cm stone was misread I think.  She has done well after surgery and will be discharged home.  Consults: None  Significant Diagnostic Studies: radiology:  [place] and mrcp  Treatments: surgery: lap chole  Discharge Exam: Blood pressure 122/74, pulse (!) 58, temperature 98 F (36.7 C), temperature source Oral, resp. rate 18, height 5\' 3"  (1.6 m), weight 88.5 kg, SpO2 100%. Ab soft approp tender nondistended incisions clean  Disposition: Discharge disposition: 01-Home or Self Care        Allergies as of 04/16/2023   No Known Allergies      Medication List     STOP taking these medications    HYDROcodone-acetaminophen 5-325 MG tablet Commonly known as: NORCO/VICODIN       TAKE these medications    Accu-Chek Guide test strip Generic drug: glucose blood Check blood glucose level by fingerstick once per day. E11.65   Accu-Chek Guide w/Device Kit Check blood glucose level by fingerstick once per day. E11.65   Accu-Chek Softclix Lancets lancets Check blood glucose level by fingerstick once per day. E11.65   albuterol 108 (90 Base) MCG/ACT inhaler Commonly known as: VENTOLIN HFA Inhale 2 puffs into the lungs every 6 (six) hours as needed for wheezing or shortness of breath.   atorvastatin 80 MG tablet Commonly known as: LIPITOR Take 1 tablet (80 mg total) by mouth daily.   buPROPion 150 MG 12 hr  tablet Commonly known as: Wellbutrin SR Take 1 tablet (150 mg total) by mouth 2 (two) times daily.   cyclobenzaprine 5 MG tablet Commonly known as: FLEXERIL Take 1 tablet (5 mg total) by mouth at bedtime as needed for muscle spasms.   cyclopentolate 1 % ophthalmic solution Commonly known as: CYCLODRYL,CYCLOGYL   estradiol 0.0375 MG/24HR Commonly known as: VIVELLE-DOT PLACE 1 PATCH ONTO THE SKIN 2 (TWO) TIMES A WEEK.   estradiol 0.1 MG/GM vaginal cream Commonly known as: ESTRACE Apply 1 gram per vagina every night for 2 weeks, then apply three times a week   Farxiga 5 MG Tabs tablet Generic drug: dapagliflozin propanediol TAKE 1 TABLET BY MOUTH ONCE  DAILY BEFORE BREAKFAST   fluticasone 50 MCG/ACT nasal spray Commonly known as: FLONASE Place 1 spray into both nostrils 2 (two) times daily. What changed:  when to take this reasons to take this   Intrarosa 6.5 MG Inst Generic drug: Prasterone Insert one capsule vaginally   montelukast 10 MG tablet Commonly known as: SINGULAIR Take 1 tablet (10 mg total) by mouth at bedtime.   nortriptyline 10 MG capsule Commonly known as: PAMELOR TAKE 1 CAPSULE BY MOUTH EVERY NIGHT AT BEDTIME FOR 2 WEEKS THEN INCREASE TO 2 CAPSULES EVERY NIGHT AT BEDTIME   omeprazole 40 MG capsule Commonly known as: PRILOSEC Take 1 capsule (40 mg total) by mouth daily.   ondansetron 4 MG disintegrating tablet Commonly known as: ZOFRAN-ODT Take 1 tablet (4 mg total) by mouth every 8 (eight) hours as needed  for vomiting or nausea.   oxyCODONE 5 MG immediate release tablet Commonly known as: Oxy IR/ROXICODONE Take 1 tablet (5 mg total) by mouth every 4 (four) hours as needed for moderate pain (pain score 4-6).   prednisoLONE acetate 1 % ophthalmic suspension Commonly known as: PRED FORTE Place 1 drop into the left eye 4 (four) times daily.   progesterone 200 MG capsule Commonly known as: PROMETRIUM TAKE 1 CAPSULE (200 MG TOTAL) BY MOUTH DAILY.    promethazine-dextromethorphan 6.25-15 MG/5ML syrup Commonly known as: PROMETHAZINE-DM Take 5 mLs by mouth 4 (four) times daily as needed for cough.   ramipril 2.5 MG capsule Commonly known as: ALTACE TAKE 1 CAPSULE(2.5 MG) BY MOUTH DAILY   Trulicity 1.5 MG/0.5ML Soaj Generic drug: Dulaglutide Inject 1.5 mg into the skin once a week.        Follow-up Information     Emelia Loron, MD Follow up in 3 week(s).   Specialty: General Surgery Why: office will call to setup an appt Contact information: [address] [place] [address] [phone]                 Signed: Emelia Loron 04/16/2023, 8:13 AM

## 2023-04-18 LAB — SURGICAL PATHOLOGY

## 2023-05-02 ENCOUNTER — Encounter (HOSPITAL_BASED_OUTPATIENT_CLINIC_OR_DEPARTMENT_OTHER): Payer: Self-pay

## 2023-05-02 DIAGNOSIS — G4733 Obstructive sleep apnea (adult) (pediatric): Secondary | ICD-10-CM

## 2023-05-02 DIAGNOSIS — R0683 Snoring: Secondary | ICD-10-CM

## 2023-05-04 ENCOUNTER — Other Ambulatory Visit: Payer: Self-pay | Admitting: Nurse Practitioner

## 2023-05-04 ENCOUNTER — Ambulatory Visit: Payer: Self-pay

## 2023-05-04 DIAGNOSIS — B3731 Acute candidiasis of vulva and vagina: Secondary | ICD-10-CM

## 2023-05-04 MED ORDER — FLUCONAZOLE 150 MG PO TABS: 150.0000 mg | ORAL_TABLET | ORAL | 0 refills | Status: DC

## 2023-05-04 NOTE — Telephone Encounter (Signed)
DIFLUCAN SENT. UTI DOES NOT CAUSE ITCHING.

## 2023-05-04 NOTE — Telephone Encounter (Signed)
Summary: medication request   Patient called stated she is experiencing burning and itching when she urinate. She thinks she has a UTI and is requesting medication. Please f/u with patient     Chief Complaint: Urinary pain, burning, itching. Asking for VV, "I have to arrange transportation and it takes a couple days." Symptoms: Above Frequency: 2 days ago Pertinent Negatives: Patient denies fever Disposition: [] ED /[] Urgent Care (no appt availability in office) / [] Appointment(In office/virtual)/ []  [facility]/ [] Home Care/ [] Refused Recommended Disposition /[] [facility]/ [x]  Follow-up with PCP Additional Notes: Please advise pt.  Reason for Disposition  Urinating more frequently than usual (i.e., frequency)  Answer Assessment - Initial Assessment Questions 1. SYMPTOM: "What's the main symptom you're concerned about?" (e.g., frequency, incontinence)     Burning, itching 2. ONSET: "When did the    start?"     2 days ago 3. PAIN: "Is there any pain?" If Yes, ask: "How bad is it?" (Scale: 1-10; mild, moderate, severe)     No 4. CAUSE: "What do you think is causing the symptoms?"     UTI 5. OTHER SYMPTOMS: "Do you have any other symptoms?" (e.g., blood in urine, fever, flank pain, pain with urination)     No 6. PREGNANCY: "Is there any chance you are pregnant?" "When was your last menstrual period?"     No  Protocols used: Urinary Symptoms-A-AH

## 2023-05-05 NOTE — Telephone Encounter (Signed)
Patient is aware 

## 2023-05-08 ENCOUNTER — Other Ambulatory Visit: Payer: Self-pay | Admitting: Nurse Practitioner

## 2023-05-08 DIAGNOSIS — B3731 Acute candidiasis of vulva and vagina: Secondary | ICD-10-CM

## 2023-06-07 ENCOUNTER — Ambulatory Visit (HOSPITAL_BASED_OUTPATIENT_CLINIC_OR_DEPARTMENT_OTHER): Payer: 59 | Attending: Otolaryngology | Admitting: Internal Medicine

## 2023-06-07 ENCOUNTER — Other Ambulatory Visit: Payer: Self-pay | Admitting: Obstetrics and Gynecology

## 2023-06-07 VITALS — Ht 62.0 in | Wt 140.0 lb

## 2023-06-07 DIAGNOSIS — G4733 Obstructive sleep apnea (adult) (pediatric): Secondary | ICD-10-CM

## 2023-06-07 DIAGNOSIS — R6882 Decreased libido: Secondary | ICD-10-CM

## 2023-06-07 DIAGNOSIS — R0683 Snoring: Secondary | ICD-10-CM

## 2023-06-08 ENCOUNTER — Telehealth: Payer: Self-pay

## 2023-06-08 NOTE — Telephone Encounter (Signed)
 Left voicemail for patient to call back and schedule a virtual follow up with Dr. Briscoe Deutscher to discuss her medications.

## 2023-06-10 ENCOUNTER — Other Ambulatory Visit: Payer: Self-pay | Admitting: Nurse Practitioner

## 2023-06-10 DIAGNOSIS — R0683 Snoring: Secondary | ICD-10-CM

## 2023-06-10 DIAGNOSIS — G4733 Obstructive sleep apnea (adult) (pediatric): Secondary | ICD-10-CM

## 2023-06-10 NOTE — Procedures (Signed)
     Patient Name: Ann Foster, Ann Foster Date: 06/07/2023 Gender: Female D.O.B: 1966/05/08 Age (years): 89 Referring Provider: Elspeth Coddington MD Height (inches): 62 Interpreting Physician: Reggy Salt MD, ABSM Weight (lbs): 140 RPSGT: Blinda Buerger BMI: 26 MRN: [id number] Neck Size: 15.00  CLINICAL INFORMATION Sleep Study Type: NPSG Indication for sleep study: Diabetes, Hypertension, Snoring Epworth Sleepiness Score: 5  SLEEP STUDY TECHNIQUE As per the AASM Manual for the Scoring of Sleep and Associated Events v2.3 (April 2016) with a hypopnea requiring 4% desaturations.  The channels recorded and monitored were frontal, central and occipital EEG, electrooculogram (EOG), submentalis EMG (chin), nasal and oral airflow, thoracic and abdominal wall motion, anterior tibialis EMG, snore microphone, electrocardiogram, and pulse oximetry.  MEDICATIONS Medications self-administered by patient taken the night of the study : none reported  SLEEP ARCHITECTURE The study was initiated at 10:22:41 PM and ended at 4:54:41 AM.  Sleep onset time was 16.1 minutes and the sleep efficiency was 90.6%. The total sleep time was 355 minutes.  Stage REM latency was 63.5 minutes.  The patient spent 3.9% of the night in stage N1 sleep, 77.7% in stage N2 sleep, 0.0% in stage N3 and 18.3% in REM.  Alpha intrusion was absent.  Supine sleep was 13.10%.  RESPIRATORY PARAMETERS The overall apnea/hypopnea index (AHI) was 20.6 per hour. There were 12 total apneas, including 12 obstructive, 0 central and 0 mixed apneas. There were 110 hypopneas and 12 RERAs.  The AHI during Stage REM sleep was 13.8 per hour.  AHI while supine was 58.1 per hour.  The mean oxygen saturation was 95.6%. The minimum SpO2 during sleep was 83.0%.  moderate snoring was noted during this study.  CARDIAC DATA The 2 lead EKG demonstrated sinus rhythm. The mean heart rate was 75.2 beats per minute. Other EKG findings include:  PVCs.  LEG MOVEMENT DATA The total PLMS were 0 with a resulting PLMS index of 0.0. Associated arousal with leg movement index was 4.9 .  IMPRESSIONS - Moderate obstructive sleep apnea occurred during this study (AHI = 20.6/h). - Mild oxygen desaturation was noted during this study (Min O2 = 83.0%, Mean 95.6%). - The patient snored with moderate snoring volume. - EKG findings include PVCs, PACs. - Clinically significant periodic limb movements did not occur during sleep. No significant associated arousals.  DIAGNOSIS - Obstructive Sleep Apnea (G47.33)  RECOMMENDATIONS - Suggest CPAP titration sleep study or autopap. Other options would be based on clinical judgment. - Be careful with alcohol, sedatives and other CNS depressants that may worsen sleep apnea and disrupt normal sleep architecture. - Sleep hygiene should be reviewed to assess factors that may improve sleep quality. - Weight management and regular exercise should be initiated or continued if appropriate.  [Electronically signed] 06/10/2023 11:09 AM  Reggy Salt MD, ABSM Diplomate, American Board of Sleep Medicine NPI: [id number]                        Reggy Salt Diplomate, American Board of Sleep Medicine  ELECTRONICALLY SIGNED ON:  06/10/2023, 11:01 AM [facility] PH: [phone]   FX: [phone] ACCREDITED BY THE AMERICAN ACADEMY OF SLEEP MEDICINE

## 2023-06-12 DIAGNOSIS — G4733 Obstructive sleep apnea (adult) (pediatric): Secondary | ICD-10-CM | POA: Diagnosis not present

## 2023-06-12 DIAGNOSIS — H7491 Unspecified disorder of right middle ear and mastoid: Secondary | ICD-10-CM | POA: Diagnosis not present

## 2023-06-12 DIAGNOSIS — H9011 Conductive hearing loss, unilateral, right ear, with unrestricted hearing on the contralateral side: Secondary | ICD-10-CM | POA: Diagnosis not present

## 2023-06-12 DIAGNOSIS — H90A31 Mixed conductive and sensorineural hearing loss, unilateral, right ear with restricted hearing on the contralateral side: Secondary | ICD-10-CM | POA: Diagnosis not present

## 2023-06-15 DIAGNOSIS — H902 Conductive hearing loss, unspecified: Secondary | ICD-10-CM | POA: Diagnosis not present

## 2023-06-15 DIAGNOSIS — R220 Localized swelling, mass and lump, head: Secondary | ICD-10-CM | POA: Diagnosis not present

## 2023-06-15 DIAGNOSIS — H7491 Unspecified disorder of right middle ear and mastoid: Secondary | ICD-10-CM | POA: Diagnosis not present

## 2023-06-15 DIAGNOSIS — H9011 Conductive hearing loss, unilateral, right ear, with unrestricted hearing on the contralateral side: Secondary | ICD-10-CM | POA: Diagnosis not present

## 2023-06-19 ENCOUNTER — Other Ambulatory Visit: Payer: Self-pay

## 2023-06-19 ENCOUNTER — Encounter: Payer: Self-pay | Admitting: Nurse Practitioner

## 2023-06-19 ENCOUNTER — Ambulatory Visit: Payer: 59 | Attending: Nurse Practitioner | Admitting: Nurse Practitioner

## 2023-06-19 VITALS — BP 119/73 | HR 70 | Resp 20 | Ht 62.0 in | Wt 193.0 lb

## 2023-06-19 DIAGNOSIS — Z7985 Long-term (current) use of injectable non-insulin antidiabetic drugs: Secondary | ICD-10-CM | POA: Diagnosis not present

## 2023-06-19 DIAGNOSIS — B029 Zoster without complications: Secondary | ICD-10-CM | POA: Diagnosis not present

## 2023-06-19 DIAGNOSIS — Z7984 Long term (current) use of oral hypoglycemic drugs: Secondary | ICD-10-CM | POA: Diagnosis not present

## 2023-06-19 DIAGNOSIS — Z794 Long term (current) use of insulin: Secondary | ICD-10-CM | POA: Diagnosis not present

## 2023-06-19 DIAGNOSIS — E113212 Type 2 diabetes mellitus with mild nonproliferative diabetic retinopathy with macular edema, left eye: Secondary | ICD-10-CM

## 2023-06-19 DIAGNOSIS — E1165 Type 2 diabetes mellitus with hyperglycemia: Secondary | ICD-10-CM

## 2023-06-19 DIAGNOSIS — E119 Type 2 diabetes mellitus without complications: Secondary | ICD-10-CM

## 2023-06-19 LAB — POCT GLYCOSYLATED HEMOGLOBIN (HGB A1C): Hemoglobin A1C: 8.6 % — AB (ref 4.0–5.6)

## 2023-06-19 MED ORDER — TRULICITY 3 MG/0.5ML ~~LOC~~ SOAJ
3.0000 mg | SUBCUTANEOUS | 0 refills | Status: DC
  Filled 2023-06-19: qty 6, 84d supply, fill #0

## 2023-06-19 MED ORDER — VALACYCLOVIR HCL 1 G PO TABS: 1000.0000 mg | ORAL_TABLET | Freq: Three times a day (TID) | ORAL | 0 refills | Status: AC

## 2023-06-19 NOTE — Progress Notes (Unsigned)
Assessment & Plan:  Ann Foster was seen today for medical management of chronic issues.  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 FOR SHINGLES    Patient has been counseled on age-appropriate routine health concerns for screening and prevention. These are reviewed and up-to-date. Referrals have been placed accordingly. Immunizations are up-to-date or declined.    Subjective:   Chief Complaint  Patient presents with   Medical Management of Chronic Issues    Ann Foster 58 y.o. female presents to office today   Has October appt. With eye doctor.   DM 2 Ac up to 8.6 from 7.5. Legs swelling so not able to walk as much. [facility] Was missing doses of trulicity. She had been out of trulicty for 2  Lab Results  Component Value Date   HGBA1C 8.6 (A) 06/19/2023    Lab Results  Component Value Date   HGBA1C 7.5 (A) 02/16/2023    BP Readings from Last 3 Encounters:  06/19/23 119/73  04/16/23 (!) 142/81  04/05/23 (!) 153/75    ROS  Past Medical History:  Diagnosis Date   Ankle fracture 2016   Right   Aortic atherosclerosis (HCC)    trace calcific atherosclerosis aortic arch per ct neck done 12-22-17   Bronchitis    Diabetes mellitus without complication (HCC)    Hypertension    OA (osteoarthritis) of shoulder    left shoulder, both knees arthritis   Osteoarthritis, knee     Past Surgical History:  Procedure Laterality Date   CHOLECYSTECTOMY N/A 04/15/2023   Procedure: LAPAROSCOPIC CHOLECYSTECTOMY WITH ICG DYE;  Surgeon: Emelia Loron, MD;  Location: [facility];  Service: General;  Laterality: N/A;    COLONOSCOPY     NO PAST SURGERIES      Family History  Problem Relation Age of Onset   Breast cancer Mother    Colon cancer Father 58   Diabetes Son    Esophageal cancer Neg Hx    Stomach cancer Neg Hx    Liver disease Neg Hx    Pancreatic cancer Neg Hx    Rectal cancer Neg Hx     Social History Reviewed with no changes to be made today.   Outpatient Medications Prior to Visit  Medication Sig Dispense Refill   Accu-Chek Softclix Lancets lancets Check blood glucose level by fingerstick once per day. E11.65 100 each 2   albuterol (VENTOLIN HFA) 108 (90 Base) MCG/ACT inhaler Inhale 2 puffs into the lungs every 6 (six) hours as needed for wheezing or shortness of breath. 18 g 2   atorvastatin (LIPITOR) 80 MG tablet Take 1 tablet (80 mg total) by mouth daily. 90 tablet 1   Blood Glucose Monitoring Suppl (ACCU-CHEK GUIDE) w/Device KIT Check blood glucose level by fingerstick once per day. E11.65 1 kit 0   buPROPion (WELLBUTRIN SR) 150 MG 12 hr tablet TAKE 1 TABLET(150 MG) BY MOUTH TWICE DAILY 60 tablet 1   cyclobenzaprine (FLEXERIL) 5 MG tablet Take 1 tablet (5 mg total) by mouth at bedtime as needed for muscle spasms. 30 tablet 1  cyclopentolate (CYCLODRYL,CYCLOGYL) 1 % ophthalmic solution  (Patient not taking: Reported on 04/14/2023)     dapagliflozin propanediol (FARXIGA) 5 MG TABS tablet TAKE 1 TABLET BY MOUTH ONCE  DAILY BEFORE BREAKFAST 100 tablet 1   Dulaglutide (TRULICITY) 1.5 MG/0.5ML SOPN Inject 1.5 mg into the skin once a week. 6 mL 1   estradiol (ESTRACE) 0.1 MG/GM vaginal cream Apply 1 gram per vagina every night for 2 weeks, then apply three times a week 42.5 g 12   estradiol (VIVELLE-DOT) 0.0375 MG/24HR PLACE 1 PATCH ONTO THE SKIN 2 (TWO) TIMES A WEEK. (Patient not taking: Reported on 06/19/2023) 8 patch 12   fluconazole (DIFLUCAN) 150 MG tablet Take 1 tablet (150 mg total) by mouth every 3 (three) days. 2 tablet 0   glucose blood (ACCU-CHEK GUIDE) test strip Check blood  glucose level by fingerstick once per day. E11.65 100 each 2   montelukast (SINGULAIR) 10 MG tablet Take 1 tablet (10 mg total) by mouth at bedtime. 30 tablet 3   nortriptyline (PAMELOR) 10 MG capsule TAKE 1 CAPSULE BY MOUTH EVERY NIGHT AT BEDTIME FOR 2 WEEKS THEN INCREASE TO 2 CAPSULES EVERY NIGHT AT BEDTIME (Patient not taking: Reported on 04/14/2023) 60 capsule 5   omeprazole (PRILOSEC) 40 MG capsule Take 1 capsule (40 mg total) by mouth daily. 90 capsule 3   ondansetron (ZOFRAN-ODT) 4 MG disintegrating tablet Take 1 tablet (4 mg total) by mouth every 8 (eight) hours as needed for vomiting or nausea. (Patient not taking: Reported on 06/19/2023) 8 tablet 0   oxyCODONE (OXY IR/ROXICODONE) 5 MG immediate release tablet Take 1 tablet (5 mg total) by mouth every 4 (four) hours as needed for moderate pain (pain score 4-6). 10 tablet 0   Prasterone (INTRAROSA) 6.5 MG INST Insert one capsule vaginally 28 each 3   progesterone (PROMETRIUM) 200 MG capsule TAKE 1 CAPSULE (200 MG TOTAL) BY MOUTH DAILY. 30 capsule 3   promethazine-dextromethorphan (PROMETHAZINE-DM) 6.25-15 MG/5ML syrup Take 5 mLs by mouth 4 (four) times daily as needed for cough. 240 mL 0   ramipril (ALTACE) 2.5 MG capsule TAKE 1 CAPSULE(2.5 MG) BY MOUTH DAILY 90 capsule 0   fluticasone (FLONASE) 50 MCG/ACT nasal spray Place 1 spray into both nostrils 2 (two) times daily. (Patient taking differently: Place 1 spray into both nostrils 2 (two) times daily as needed for rhinitis.) 16 g 1   prednisoLONE acetate (PRED FORTE) 1 % ophthalmic suspension Place 1 drop into the left eye 4 (four) times daily. (Patient not taking: Reported on 04/14/2023)     No facility-administered medications prior to visit.    No Known Allergies     Objective:    BP 119/73 (BP Location: Left Arm, Patient Position: Sitting, Cuff Size: Normal)   Pulse 70   Resp 20   Ht 5\' 2"  (1.575 m)   Wt 193 lb (87.5 kg)   SpO2 100%   BMI 35.30 kg/m  Wt Readings from Last 3  Encounters:  06/19/23 193 lb (87.5 kg)  06/07/23 140 lb (63.5 kg)  04/14/23 195 lb (88.5 kg)    Physical Exam       Patient has been counseled extensively about nutrition and exercise as well as the importance of adherence with medications and regular follow-up. The patient was given clear instructions to go to ER or return to medical center if symptoms don't improve, worsen or new problems develop. The patient verbalized understanding.   Follow-up: Return in about 3 months (around 09/17/2023).  Claiborne Rigg, FNP-BC [facility] [place], [place] [phone]   06/19/2023, 10:47 AM

## 2023-06-19 NOTE — Patient Instructions (Signed)
[facility] Address: [address] [place], [place] [address] Phone: [phone]

## 2023-06-20 LAB — CMP14+EGFR
ALT: 36 [IU]/L — ABNORMAL HIGH (ref 0–32)
AST: 13 [IU]/L (ref 0–40)
Albumin: 4.7 g/dL (ref 3.8–4.9)
Alkaline Phosphatase: 97 [IU]/L (ref 44–121)
BUN/Creatinine Ratio: 12 (ref 9–23)
BUN: 12 mg/dL (ref 6–24)
Bilirubin Total: 0.8 mg/dL (ref 0.0–1.2)
CO2: 24 mmol/L (ref 20–29)
Calcium: 10.2 mg/dL (ref 8.7–10.2)
Chloride: 102 mmol/L (ref 96–106)
Creatinine, Ser: 0.99 mg/dL (ref 0.57–1.00)
Globulin, Total: 3.1 g/dL (ref 1.5–4.5)
Glucose: 147 mg/dL — ABNORMAL HIGH (ref 70–99)
Potassium: 4.6 mmol/L (ref 3.5–5.2)
Sodium: 140 mmol/L (ref 134–144)
Total Protein: 7.8 g/dL (ref 6.0–8.5)
eGFR: 66 mL/min/{1.73_m2} (ref >59)

## 2023-06-21 ENCOUNTER — Other Ambulatory Visit (HOSPITAL_COMMUNITY): Payer: Self-pay

## 2023-06-21 ENCOUNTER — Other Ambulatory Visit: Payer: Self-pay

## 2023-06-22 ENCOUNTER — Encounter: Payer: Self-pay | Admitting: Nurse Practitioner

## 2023-06-26 ENCOUNTER — Ambulatory Visit (HOSPITAL_BASED_OUTPATIENT_CLINIC_OR_DEPARTMENT_OTHER): Payer: 59 | Attending: Nurse Practitioner | Admitting: Internal Medicine

## 2023-06-26 DIAGNOSIS — G4733 Obstructive sleep apnea (adult) (pediatric): Secondary | ICD-10-CM | POA: Insufficient documentation

## 2023-06-27 NOTE — Progress Notes (Signed)
 ERROR

## 2023-06-27 NOTE — Progress Notes (Signed)
ERROR

## 2023-06-28 ENCOUNTER — Other Ambulatory Visit: Payer: Self-pay

## 2023-06-30 DIAGNOSIS — G4733 Obstructive sleep apnea (adult) (pediatric): Secondary | ICD-10-CM | POA: Diagnosis not present

## 2023-06-30 DIAGNOSIS — H65491 Other chronic nonsuppurative otitis media, right ear: Secondary | ICD-10-CM | POA: Diagnosis not present

## 2023-06-30 DIAGNOSIS — H9011 Conductive hearing loss, unilateral, right ear, with unrestricted hearing on the contralateral side: Secondary | ICD-10-CM | POA: Diagnosis not present

## 2023-07-02 DIAGNOSIS — G4733 Obstructive sleep apnea (adult) (pediatric): Secondary | ICD-10-CM

## 2023-07-02 NOTE — Procedures (Signed)
Patient Name: Ann Foster, Takemoto Date: 06/26/2023 Gender: Female D.O.B: [birth year] Age (years): 51 Referring Provider: Claiborne Rigg NP Height (inches): 62 Interpreting Physician: Jetty Duhamel MD, ABSM Weight (lbs): 140 RPSGT: Peak, Robert BMI: 26 MRN: [id number] Neck Size: 15.00  CLINICAL INFORMATION The patient is referred for a CPAP titration to treat sleep apnea.  Date of NPSG, Split Night or HST:  NPSG 06/07/23  AHI 20.6/hr, desaturation to 83%, body weightr 140 lbs  SLEEP STUDY TECHNIQUE As per the AASM Manual for the Scoring of Sleep and Associated Events v2.3 (April 2016) with a hypopnea requiring 4% desaturations.  The channels recorded and monitored were frontal, central and occipital EEG, electrooculogram (EOG), submentalis EMG (chin), nasal and oral airflow, thoracic and abdominal wall motion, anterior tibialis EMG, snore microphone, electrocardiogram, and pulse oximetry. Continuous positive airway pressure (CPAP) was initiated at the beginning of the study and titrated to treat sleep-disordered breathing.  MEDICATIONS Medications self-administered by patient taken the night of the study : none reported  TECHNICIAN COMMENTS Comments added by technician: Patient was adherent to CPAP pressure using nasal cradle mask. Comments added by scorer: N/A RESPIRATORY PARAMETERS Optimal PAP Pressure (cm): 12 AHI at Optimal Pressure (/hr): 0.5 Overall Minimal O2 (%): 92.0 Supine % at Optimal Pressure (%): 23 Minimal O2 at Optimal Pressure (%): 93.0   SLEEP ARCHITECTURE The study was initiated at 10:26:18 PM and ended at 5:15:58 AM.  Sleep onset time was 28.4 minutes and the sleep efficiency was 83.7%. The total sleep time was 343 minutes.  The patient spent 3.6% of the night in stage N1 sleep, 56.9% in stage N2 sleep, 13.6% in stage N3 and 26% in REM.Stage REM latency was 145.0 minutes  Wake after sleep onset was 38.2. Alpha intrusion was absent. Supine sleep was  30.76%.  CARDIAC DATA The 2 lead EKG demonstrated sinus rhythm. The mean heart rate was N/A beats per minute. Other EKG findings include: None.  LEG MOVEMENT DATA The total Periodic Limb Movements of Sleep (PLMS) were 0. The PLMS index was 0.0. A PLMS index of <15 is considered normal in adults.  IMPRESSIONS - The optimal PAP pressure was 12 cm of water. - Central sleep apnea was not noted during this titration (CAI = 0.3/h). - Significant oxygen desaturations were not observed during this titration (min O2 = 92.0%). On CPAP 12, minimum O2 saturation 93%. - No snoring was audible during this study. - No cardiac abnormalities were observed during this study. - Clinically significant periodic limb movements were not noted during this study. Arousals associated with PLMs were rare.  DIAGNOSIS - Obstructive Sleep Apnea (G47.33)  RECOMMENDATIONS - Trial of CPAP therapy on 12 cm H2O or autopap 8-16. - Patient wore a Small Wide size Resmed Nasal Cradle AirFit N30 mask and heated humidification. - Be careful with alcohol, sedatives and other CNS depressants that may worsen sleep apnea and disrupt normal sleep architecture. - Sleep hygiene should be reviewed to assess factors that may improve sleep quality. - Weight management and regular exercise should be initiated or continued.  [Electronically signed] 07/02/2023 12:30 PM  Jetty Duhamel MD, ABSM Diplomate, American Board of Sleep Medicine NPI: [id number]                         Jetty Duhamel Diplomate, American Board of Sleep Medicine  ELECTRONICALLY SIGNED ON:  07/02/2023, 12:26 PM [facility] PH: [phone]   FX: [phone] ACCREDITED  BY THE AMERICAN ACADEMY OF SLEEP MEDICINE

## 2023-07-03 ENCOUNTER — Other Ambulatory Visit: Payer: Self-pay | Admitting: Nurse Practitioner

## 2023-07-03 ENCOUNTER — Ambulatory Visit: Payer: Self-pay | Admitting: Nurse Practitioner

## 2023-07-03 DIAGNOSIS — G4733 Obstructive sleep apnea (adult) (pediatric): Secondary | ICD-10-CM

## 2023-07-03 NOTE — Telephone Encounter (Unsigned)
Copied from CRM [id number]. Topic: Clinical - Red Word Triage >> Jul 03, 2023  1:43 PM Elle L wrote: Red Word that prompted transfer to Nurse Triage: The patient has red blisters on her neck that is painful and itches. She states she has been taking medication for shingles but is unsure if that is what it is as her legs are healing. However, this new spot has appeared.

## 2023-07-03 NOTE — Telephone Encounter (Signed)
MyChart appt. Giving to patient.  Patient aware PCP may not contact until 11:30.  Patient has dentist apt at 1 pm.

## 2023-07-03 NOTE — Telephone Encounter (Signed)
 Copied from CRM [id number]. Topic: Clinical - Medical Advice >> Jul 03, 2023  1:42 PM Elle L wrote: Reason for CRM: The patient is requesting assistance in getting a CPAP machine and wants to know if her provider has received her results. Her call back number is [phone].

## 2023-07-03 NOTE — Telephone Encounter (Signed)
   Chief Complaint: rash Symptoms: pencil point red bumps to bilateral sides of neck Frequency: itchy rash Pertinent Negatives: Patient denies fever, pain, Disposition: [] ED /[x] Urgent Care (no appt availability in office) / [] Appointment(In office/virtual)/ []  [facility]/ [] Home Care/ [] Refused Recommended Disposition /[] [facility]/ []  Follow-up with PCP Additional Notes: instructed to go to uc, due to no apt available.  States had shingles and they are healing up but now has red bumps to both sides of neck.  Instructed to go to er if becomes worse.  Reason for Disposition  Itchy insect bite  Answer Assessment - Initial Assessment Questions 1.) CALLER DIAGNOSIS: "What do you think is causing the rash?" (e.g., athlete's foot, chickenpox, hives, impetigo) 2.) LOCALIZED OR WIDESPREAD:  "Is the rash all over (widespread) or mostly just in one area of the body (localized)?"  3.) NEW MEDICINES: "Are you taking any new medicine?" 4.) APPEARANCE of RASH: "Describe the rash. What color is it?" (Note: It is difficult to assess rash color in people with darker-colored skin. When this situation occurs, simply ask the caller to describe what they see.) Had shingles and they are healing well, states now has red bumps on both sides of neck. States bumps are small like a pencil tip. And they itch.  Protocols used: Rash - Guideline Selection-A-AH, Insect Bite-A-AH

## 2023-07-04 ENCOUNTER — Telehealth (HOSPITAL_BASED_OUTPATIENT_CLINIC_OR_DEPARTMENT_OTHER): Payer: 59 | Admitting: Nurse Practitioner

## 2023-07-04 ENCOUNTER — Encounter: Payer: Self-pay | Admitting: Nurse Practitioner

## 2023-07-04 DIAGNOSIS — R21 Rash and other nonspecific skin eruption: Secondary | ICD-10-CM | POA: Diagnosis not present

## 2023-07-04 NOTE — Patient Instructions (Signed)
 Ann Foster, thank you for joining Haze LELON Servant, NP for today's virtual visit.  While this provider is not your primary care provider (PCP), if your PCP is located in our provider database this encounter information will be shared with them immediately following your visit.   A [facility] MyChart account gives you access to today's visit and all your visits, tests, and labs performed at [facility]  click here if you don't have a [facility] MyChart account or go to [email or web address]  Consent: (Patient) Ann Foster provided verbal consent for this virtual visit at the beginning of the encounter.  Current Medications:  Current Outpatient Medications:    Accu-Chek Softclix Lancets lancets, Check blood glucose level by fingerstick once per day. E11.65, Disp: 100 each, Rfl: 2   albuterol  (VENTOLIN  HFA) 108 (90 Base) MCG/ACT inhaler, Inhale 2 puffs into the lungs every 6 (six) hours as needed for wheezing or shortness of breath., Disp: 18 g, Rfl: 2   atorvastatin  (LIPITOR) 80 MG tablet, Take 1 tablet (80 mg total) by mouth daily., Disp: 90 tablet, Rfl: 1   Blood Glucose Monitoring Suppl (ACCU-CHEK GUIDE) w/Device KIT, Check blood glucose level by fingerstick once per day. E11.65, Disp: 1 kit, Rfl: 0   buPROPion  (WELLBUTRIN  SR) 150 MG 12 hr tablet, TAKE 1 TABLET(150 MG) BY MOUTH TWICE DAILY, Disp: 60 tablet, Rfl: 1   cyclobenzaprine  (FLEXERIL ) 5 MG tablet, Take 1 tablet (5 mg total) by mouth at bedtime as needed for muscle spasms., Disp: 30 tablet, Rfl: 1   cyclopentolate (CYCLODRYL,CYCLOGYL) 1 % ophthalmic solution, , Disp: , Rfl:    dapagliflozin  propanediol (FARXIGA ) 5 MG TABS tablet, TAKE 1 TABLET BY MOUTH ONCE  DAILY BEFORE BREAKFAST, Disp: 100 tablet, Rfl: 1   Dulaglutide  (TRULICITY ) 3 MG/0.5ML SOAJ, Inject 3 mg as directed once a week., Disp: 6 mL, Rfl: 0   estradiol  (ESTRACE ) 0.1 MG/GM vaginal cream, Apply 1 gram per vagina every night for 2 weeks, then  apply three times a week, Disp: 42.5 g, Rfl: 12   fluconazole  (DIFLUCAN ) 150 MG tablet, Take 1 tablet (150 mg total) by mouth every 3 (three) days., Disp: 2 tablet, Rfl: 0   glucose blood (ACCU-CHEK GUIDE) test strip, Check blood glucose level by fingerstick once per day. E11.65, Disp: 100 each, Rfl: 2   montelukast  (SINGULAIR ) 10 MG tablet, Take 1 tablet (10 mg total) by mouth at bedtime., Disp: 30 tablet, Rfl: 3   omeprazole  (PRILOSEC) 40 MG capsule, Take 1 capsule (40 mg total) by mouth daily., Disp: 90 capsule, Rfl: 3   ondansetron  (ZOFRAN -ODT) 4 MG disintegrating tablet, Take 1 tablet (4 mg total) by mouth every 8 (eight) hours as needed for vomiting or nausea. (Patient not taking: Reported on 06/19/2023), Disp: 8 tablet, Rfl: 0   Prasterone  (INTRAROSA ) 6.5 MG INST, Insert one capsule vaginally, Disp: 28 each, Rfl: 3   promethazine -dextromethorphan (PROMETHAZINE -DM) 6.25-15 MG/5ML syrup, Take 5 mLs by mouth 4 (four) times daily as needed for cough., Disp: 240 mL, Rfl: 0   ramipril  (ALTACE ) 2.5 MG capsule, TAKE 1 CAPSULE(2.5 MG) BY MOUTH DAILY, Disp: 90 capsule, Rfl: 0   Medications ordered in this encounter:  No orders of the defined types were placed in this encounter.    *If you need refills on other medications prior to your next appointment, please contact your pharmacy*  Follow-Up: Call back or seek an in-person evaluation if the symptoms worsen or if the condition fails to improve as anticipated.  [facility] [phone]  Other Instructions May try hydrocortisone over the counter.    If you have been instructed to have an in-person evaluation today at a local Urgent Care facility, please use the link below. It will take you to a list of all of our available [facility], including address, phone number and hours of operation. Please do not delay care.  [facility]  If you or a family member do not have a primary care provider, use the  link below to schedule a visit and establish care. When you choose a [facility] primary care physician or advanced practice provider, you gain a long-term partner in health. Find a Primary Care Provider  Learn more about [facility]'s in-office and virtual care options: [facility] - Get Care Now

## 2023-07-04 NOTE — Progress Notes (Signed)
 Virtual Visit Consent   Ann Foster, you are scheduled for a virtual visit with a [facility] provider today. Just as with appointments in the office, your consent must be obtained to participate. Your consent will be active for this visit and any virtual visit you may have with one of our providers in the next 365 days. If you have a MyChart account, a copy of this consent can be sent to you electronically.  As this is a virtual visit, video technology does not allow for your provider to perform a traditional examination. This may limit your provider's ability to fully assess your condition. If your provider identifies any concerns that need to be evaluated in person or the need to arrange testing (such as labs, EKG, etc.), we will make arrangements to do so. Although advances in technology are sophisticated, we cannot ensure that it will always work on either your end or our end. If the connection with a video visit is poor, the visit may have to be switched to a telephone visit. With either a video or telephone visit, we are not always able to ensure that we have a secure connection.  By engaging in this virtual visit, you consent to the provision of healthcare and authorize for your insurance to be billed (if applicable) for the services provided during this visit. Depending on your insurance coverage, you may receive a charge related to this service.  I need to obtain your verbal consent now. Are you willing to proceed with your visit today? Ann Foster has provided verbal consent on 07/04/2023 for a virtual visit (video or telephone). Haze LELON Servant, NP  Date: 07/04/2023 11:44 AM  Virtual Visit via Video Note   I, Haze LELON Servant, connected with  Ann Foster  ([id number], [birth year]) on 07/04/23 at 11:10 AM EST by a video-enabled telemedicine application and verified that I am speaking with the correct person using two identifiers.  Location: Patient: Virtual Visit Location  Patient: Home Provider: Virtual Visit Location Provider: Home Office   I discussed the limitations of evaluation and management by telemedicine and the availability of in person appointments. The patient expressed understanding and agreed to proceed.    History of Present Illness: Ann Foster is a 58 y.o. who identifies as a female who was assigned female at birth, and is being seen today for rash  Ann Foster was recently treated for shingles and was treated with valacyclovir  on 06-19-2023. She now has a dry patchy rash on her left shoulder with pruritus. ON exam there are no vesicles, erythema or patterns of shingles observed.    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 E11.65, Disp: 100 each, Rfl: 2   albuterol  (VENTOLIN  HFA) 108 (90 Base) MCG/ACT inhaler, Inhale 2 puffs into the lungs every 6 (six) hours as  needed for wheezing or shortness of breath., Disp: 18 g, Rfl: 2   atorvastatin  (LIPITOR) 80 MG tablet, Take 1 tablet (80 mg total) by mouth daily., Disp: 90 tablet, Rfl: 1   Blood Glucose Monitoring Suppl (ACCU-CHEK GUIDE) w/Device KIT, Check blood glucose level by fingerstick once per day. E11.65, Disp: 1 kit, Rfl: 0   buPROPion  (WELLBUTRIN  SR) 150 MG 12 hr tablet, TAKE 1 TABLET(150 MG)  BY MOUTH TWICE DAILY, Disp: 60 tablet, Rfl: 1   cyclobenzaprine  (FLEXERIL ) 5 MG tablet, Take 1 tablet (5 mg total) by mouth at bedtime as needed for muscle spasms., Disp: 30 tablet, Rfl: 1   cyclopentolate (CYCLODRYL,CYCLOGYL) 1 % ophthalmic solution, , Disp: , Rfl:    dapagliflozin  propanediol (FARXIGA ) 5 MG TABS tablet, TAKE 1 TABLET BY MOUTH ONCE  DAILY BEFORE BREAKFAST, Disp: 100 tablet, Rfl: 1   Dulaglutide  (TRULICITY ) 3 MG/0.5ML SOAJ, Inject 3 mg as directed once a week., Disp: 6 mL, Rfl: 0   estradiol  (ESTRACE ) 0.1 MG/GM vaginal cream, Apply 1 gram per vagina every night for 2 weeks, then apply three times a week, Disp: 42.5 g, Rfl: 12   fluconazole  (DIFLUCAN ) 150 MG tablet, Take 1 tablet (150 mg total) by mouth every 3 (three) days., Disp: 2 tablet, Rfl: 0   glucose blood (ACCU-CHEK GUIDE) test strip, Check blood glucose level by fingerstick once per day. E11.65, Disp: 100 each, Rfl: 2   montelukast  (SINGULAIR ) 10 MG tablet, Take 1 tablet (10 mg total) by mouth at bedtime., Disp: 30 tablet, Rfl: 3   omeprazole  (PRILOSEC) 40 MG capsule, Take 1 capsule (40 mg total) by mouth daily., Disp: 90 capsule, Rfl: 3   ondansetron  (ZOFRAN -ODT) 4 MG disintegrating tablet, Take 1 tablet (4 mg total) by mouth every 8 (eight) hours as needed for vomiting or nausea. (Patient not taking: Reported on 06/19/2023), Disp: 8 tablet, Rfl: 0   Prasterone  (INTRAROSA ) 6.5 MG INST, Insert one capsule vaginally, Disp: 28 each, Rfl: 3   promethazine -dextromethorphan (PROMETHAZINE -DM) 6.25-15 MG/5ML syrup, Take 5 mLs by mouth 4 (four) times daily as needed for cough., Disp: 240 mL, Rfl: 0   ramipril  (ALTACE ) 2.5 MG capsule, TAKE 1 CAPSULE(2.5 MG) BY MOUTH DAILY, Disp: 90 capsule, Rfl: 0  Observations/Objective: Patient is well-developed, well-nourished in no acute distress.  Resting comfortably at home.  Head is normocephalic, atraumatic.  No labored breathing.  Speech is clear and coherent with logical content.   Patient is alert and oriented at baseline.    Assessment and Plan: 1. Rash (Primary) May try hydrocortisone over the counter.   Follow Up Instructions: I discussed the assessment and treatment plan with the patient. The patient was provided an opportunity to ask questions and all were answered. The patient agreed with the plan and demonstrated an understanding of the instructions.  A copy of instructions were sent to the patient via MyChart unless otherwise noted below.   The patient was advised to call back or seek an in-person evaluation if the symptoms worsen or if the condition fails to improve as anticipated.    Karah Caruthers W Tedra Coppernoll, NP

## 2023-07-05 NOTE — Telephone Encounter (Signed)
 Order had been created within parachute.

## 2023-07-14 ENCOUNTER — Telehealth: Payer: 59 | Admitting: Obstetrics and Gynecology

## 2023-07-14 DIAGNOSIS — R6882 Decreased libido: Secondary | ICD-10-CM

## 2023-07-14 DIAGNOSIS — N952 Postmenopausal atrophic vaginitis: Secondary | ICD-10-CM | POA: Diagnosis not present

## 2023-07-14 MED ORDER — BUSPIRONE HCL 10 MG PO TABS: 10.0000 mg | ORAL_TABLET | Freq: Two times a day (BID) | ORAL | 1 refills | Status: DC

## 2023-07-14 NOTE — Progress Notes (Signed)
 GYNECOLOGY VIRTUAL VISIT ENCOUNTER NOTE  Provider location: Center for [facility]   Patient location: Home  I connected with Ann Foster on 07/14/23 at  9:15 AM EST by MyChart Video Encounter and verified that I am speaking with the correct person using two identifiers.   I discussed the limitations, risks, security and privacy concerns of performing an evaluation and management service virtually and the availability of in person appointments. I also discussed with the patient that there may be a patient responsible charge related to this service. The patient expressed understanding and agreed to proceed.   History:  Ann Foster is a 58 y.o. G0P0000 female being evaluated today for follow up of GSM and decreased libido. Shas noticeed improvement with vaginal discomfort With the medication, will need a refill. Has been on the medication for a few months and has not seen much improvement in her desires for sexual intercourse. Small improvement but not a lot in the desire for intercourse.  Hot flashes have imrpoved but not the desired for intercourse Has interest in having the bumps on her vulvar removed - They are getting caught on clothing (per prior notes, has multiple inclusion cysts on vulva)     Past Medical History:  Diagnosis Date   Ankle fracture 2016   Right   Aortic atherosclerosis (HCC)    trace calcific atherosclerosis aortic arch per ct neck done 12-22-17   Bronchitis    Diabetes mellitus without complication (HCC)    Hypertension    OA (osteoarthritis) of shoulder    left shoulder, both knees arthritis   Osteoarthritis, knee    Past Surgical History:  Procedure Laterality Date   CHOLECYSTECTOMY N/A 04/15/2023   Procedure: LAPAROSCOPIC CHOLECYSTECTOMY WITH ICG DYE;  Surgeon: Emelia Loron, MD;  Location: [facility];  Service: General;  Laterality: N/A;   COLONOSCOPY     NO PAST SURGERIES     The following portions of  the patient's history were reviewed and updated as appropriate: allergies, current medications, past family history, past medical history, past social history, past surgical history and problem list.   Health Maintenance: 07/2019 NILM, HPV negative  Review of Systems:  Pertinent items noted in HPI and remainder of comprehensive ROS otherwise negative.  Physical Exam:   General:  Alert, oriented and cooperative. Patient appears to be in no acute distress.  Mental Status: Normal mood and affect. Normal behavior. Normal judgment and thought content.   Respiratory: Normal respiratory effort, no problems with respiration noted  Rest of physical exam deferred due to type of encounter       Assessment and Plan:     1. Decreased libido (Primary) Not much improvement with wellbutrin, will trial buspar. Noted may or may not see improvement in hot flashes with buspar.  - busPIRone (BUSPAR) 10 MG tablet; Take 1 tablet (10 mg total) by mouth 2 (two) times daily.  Dispense: 60 tablet; Refill: 1  2. Vaginal atrophy Continue vaginal estrogen cream. Will return for evaluation and likely removal of vulvar cysts.        I discussed the assessment and treatment plan with the patient. The patient was provided an opportunity to ask questions and all were answered. The patient agreed with the plan and demonstrated an understanding of the instructions.   The patient was advised to call back or seek an in-person evaluation/go to the ED if the symptoms worsen or if the condition fails to improve as anticipated.  I  provided 5 minutes of face-to-face time during this encounter. I also spent 7 minutes dedicated to the care of this patient including pre-visit review of records, post visit ordering of medications and appropriate tests or procedures, coordinating care and documenting this visit encounter.    Lorriane Shire, MD Center for [employer], [facility]

## 2023-07-27 DIAGNOSIS — G4733 Obstructive sleep apnea (adult) (pediatric): Secondary | ICD-10-CM | POA: Diagnosis not present

## 2023-08-03 ENCOUNTER — Telehealth: Payer: Self-pay

## 2023-08-03 NOTE — Telephone Encounter (Signed)
 Received a form from [employer] to schedule patient for a follow up visit with Dr young between 07-31-2023 and 10-25-2023. Called patient and relayed message. Patient verbalized understanding.

## 2023-08-04 DIAGNOSIS — M79605 Pain in left leg: Secondary | ICD-10-CM | POA: Diagnosis not present

## 2023-08-04 DIAGNOSIS — M79604 Pain in right leg: Secondary | ICD-10-CM | POA: Diagnosis not present

## 2023-08-04 DIAGNOSIS — I83813 Varicose veins of bilateral lower extremities with pain: Secondary | ICD-10-CM | POA: Diagnosis not present

## 2023-08-04 DIAGNOSIS — I872 Venous insufficiency (chronic) (peripheral): Secondary | ICD-10-CM | POA: Diagnosis not present

## 2023-08-04 DIAGNOSIS — M79661 Pain in right lower leg: Secondary | ICD-10-CM | POA: Diagnosis not present

## 2023-08-04 DIAGNOSIS — M7989 Other specified soft tissue disorders: Secondary | ICD-10-CM | POA: Diagnosis not present

## 2023-08-08 DIAGNOSIS — H7491 Unspecified disorder of right middle ear and mastoid: Secondary | ICD-10-CM | POA: Diagnosis not present

## 2023-08-08 DIAGNOSIS — H6121 Impacted cerumen, right ear: Secondary | ICD-10-CM | POA: Diagnosis not present

## 2023-08-08 DIAGNOSIS — H65491 Other chronic nonsuppurative otitis media, right ear: Secondary | ICD-10-CM | POA: Diagnosis not present

## 2023-08-08 DIAGNOSIS — H73891 Other specified disorders of tympanic membrane, right ear: Secondary | ICD-10-CM | POA: Diagnosis not present

## 2023-08-08 DIAGNOSIS — H9011 Conductive hearing loss, unilateral, right ear, with unrestricted hearing on the contralateral side: Secondary | ICD-10-CM | POA: Diagnosis not present

## 2023-08-14 ENCOUNTER — Other Ambulatory Visit: Payer: Self-pay | Admitting: Obstetrics and Gynecology

## 2023-08-14 DIAGNOSIS — R6882 Decreased libido: Secondary | ICD-10-CM

## 2023-08-27 DIAGNOSIS — L0291 Cutaneous abscess, unspecified: Secondary | ICD-10-CM | POA: Diagnosis not present

## 2023-08-28 ENCOUNTER — Other Ambulatory Visit: Payer: Self-pay | Admitting: Family Medicine

## 2023-08-28 DIAGNOSIS — E1165 Type 2 diabetes mellitus with hyperglycemia: Secondary | ICD-10-CM

## 2023-08-29 DIAGNOSIS — G4733 Obstructive sleep apnea (adult) (pediatric): Secondary | ICD-10-CM | POA: Diagnosis not present

## 2023-09-16 DIAGNOSIS — G4733 Obstructive sleep apnea (adult) (pediatric): Secondary | ICD-10-CM | POA: Diagnosis not present

## 2023-09-18 ENCOUNTER — Encounter: Payer: Self-pay | Admitting: Nurse Practitioner

## 2023-09-18 ENCOUNTER — Ambulatory Visit: Payer: 59 | Attending: Nurse Practitioner | Admitting: Nurse Practitioner

## 2023-09-18 VITALS — BP 128/75 | HR 73 | Resp 19 | Ht 60.0 in | Wt 198.4 lb

## 2023-09-18 DIAGNOSIS — Z7985 Long-term (current) use of injectable non-insulin antidiabetic drugs: Secondary | ICD-10-CM | POA: Diagnosis not present

## 2023-09-18 DIAGNOSIS — I83813 Varicose veins of bilateral lower extremities with pain: Secondary | ICD-10-CM

## 2023-09-18 DIAGNOSIS — U099 Post covid-19 condition, unspecified: Secondary | ICD-10-CM

## 2023-09-18 DIAGNOSIS — M170 Bilateral primary osteoarthritis of knee: Secondary | ICD-10-CM

## 2023-09-18 DIAGNOSIS — R053 Chronic cough: Secondary | ICD-10-CM

## 2023-09-18 DIAGNOSIS — M65331 Trigger finger, right middle finger: Secondary | ICD-10-CM

## 2023-09-18 DIAGNOSIS — E119 Type 2 diabetes mellitus without complications: Secondary | ICD-10-CM

## 2023-09-18 DIAGNOSIS — B3731 Acute candidiasis of vulva and vagina: Secondary | ICD-10-CM

## 2023-09-18 LAB — POCT GLYCOSYLATED HEMOGLOBIN (HGB A1C): Hemoglobin A1C: 7 % — AB (ref 4.0–5.6)

## 2023-09-18 MED ORDER — ALBUTEROL SULFATE HFA 108 (90 BASE) MCG/ACT IN AERS: 2.0000 | INHALATION_SPRAY | Freq: Four times a day (QID) | RESPIRATORY_TRACT | 2 refills | Status: AC | PRN

## 2023-09-18 MED ORDER — TRULICITY 3 MG/0.5ML ~~LOC~~ SOAJ: 3.0000 mg | SUBCUTANEOUS | 0 refills | Status: DC

## 2023-09-18 MED ORDER — FLUCONAZOLE 150 MG PO TABS: 150.0000 mg | ORAL_TABLET | ORAL | 0 refills | Status: DC

## 2023-09-18 MED ORDER — BD PEN NEEDLE MINI U/F 31G X 5 MM MISC: 1 refills | Status: AC

## 2023-09-18 NOTE — Progress Notes (Unsigned)
 Needs new referral to vein spec

## 2023-09-18 NOTE — Progress Notes (Unsigned)
 Assessment & Plan:  Ann Foster was seen today for medical management of chronic issues.  Diagnoses and all orders for this visit:  Diabetes mellitus treated with injections of non-insulin  medication (HCC) -     POCT glycosylated hemoglobin (Hb A1C) -     Dulaglutide  (TRULICITY ) 3 MG/0.5ML SOAJ; Inject 3 mg as directed once a week. -     Insulin  Pen Needle (B-D UF III MINI PEN NEEDLES) 31G X 5 MM MISC; Use as instructed. Inject into the skin once weekly  Post covid-19 condition, unspecified -     albuterol  (VENTOLIN  HFA) 108 (90 Base) MCG/ACT inhaler; Inhale 2 puffs into the lungs every 6 (six) hours as needed for wheezing or shortness of breath.  Varicose veins of bilateral lower extremities with pain -     Ambulatory referral to Vascular Surgery  Bilateral primary osteoarthritis of knee -     Ambulatory referral to Orthopedic Surgery  Trigger finger, right middle finger -     Ambulatory referral to Hand Surgery  Yeast vaginitis -     fluconazole  (DIFLUCAN ) 150 MG tablet; Take 1 tablet (150 mg total) by mouth every 3 (three) days.    Patient has been counseled on age-appropriate routine health concerns for screening and prevention. These are reviewed and up-to-date. Referrals have been placed accordingly. Immunizations are up-to-date or declined.    Subjective:   Chief Complaint  Patient presents with   Medical Management of Chronic Issues    Ann Foster 58 y.o. female presents to office today for follow-up to diabetes.  She has a past medical history of Ankle fracture (2016), Aortic atherosclerosis (HCC), Bronchitis, DM2, Hypertension, OA of shoulder, and Osteoarthritis B/L knees.   DM 2 A1c down from 7.5 to 7.0 currently. B/L knee pain is limiting her activity so weight is trending up.  She is currently prescribed Trulicity  3 mg weekly and Farxiga  5 mg daily.  Lab Results  Component Value Date   HGBA1C 7.0 (A) 09/18/2023     She endorses right middle trigger  finger.  There is also associated swelling.      She has a history of bilateral knee osteoarthritis.  Pain seems to be worsening and she states her legs feel heavy and tight. Uses cane  She has seen dr Christiane Cowing for ortho    ROS  Past Medical History:  Diagnosis Date   Ankle fracture 2016   Right   Aortic atherosclerosis (HCC)    trace calcific atherosclerosis aortic arch per ct neck done 12-22-17   Bronchitis    Diabetes mellitus without complication (HCC)    Hypertension    OA (osteoarthritis) of shoulder    left shoulder, both knees arthritis   Osteoarthritis, knee     Past Surgical History:  Procedure Laterality Date   CHOLECYSTECTOMY N/A 04/15/2023   Procedure: LAPAROSCOPIC CHOLECYSTECTOMY WITH ICG DYE;  Surgeon: Enid Harry, MD;  Location: [facility];  Service: General;  Laterality: N/A;   COLONOSCOPY     NO PAST SURGERIES      Family History  Problem Relation Age of Onset   Breast cancer Mother    Colon cancer Father 31   Diabetes Son    Esophageal cancer Neg Hx    Stomach cancer Neg Hx    Liver disease Neg Hx    Pancreatic cancer Neg Hx    Rectal cancer Neg Hx     Social History Reviewed with no changes to be made today.   Outpatient Medications Prior  to Visit  Medication Sig Dispense Refill   Accu-Chek Softclix Lancets lancets Check blood glucose level by fingerstick once per day. E11.65 100 each 2   atorvastatin  (LIPITOR) 80 MG tablet Take 1 tablet (80 mg total) by mouth daily. 90 tablet 1   Blood Glucose Monitoring Suppl (ACCU-CHEK GUIDE) w/Device KIT Check blood glucose level by fingerstick once per day. E11.65 1 kit 0   buPROPion  (WELLBUTRIN  SR) 150 MG 12 hr tablet TAKE 1 TABLET(150 MG) BY MOUTH TWICE DAILY 60 tablet 1   busPIRone  (BUSPAR ) 10 MG tablet TAKE 1 TABLET(10 MG) BY MOUTH TWICE DAILY 60 tablet 1   cyclobenzaprine  (FLEXERIL ) 5 MG tablet Take 1 tablet (5 mg total) by mouth at bedtime as needed for muscle spasms. 30 tablet 1   cyclopentolate  (CYCLODRYL,CYCLOGYL) 1 % ophthalmic solution      dapagliflozin  propanediol (FARXIGA ) 5 MG TABS tablet TAKE 1 TABLET BY MOUTH ONCE  DAILY BEFORE BREAKFAST 100 tablet 1   estradiol  (ESTRACE ) 0.1 MG/GM vaginal cream Apply 1 gram per vagina every night for 2 weeks, then apply three times a week 42.5 g 12   glucose blood (ACCU-CHEK GUIDE) test strip Check blood glucose level by fingerstick once per day. E11.65 100 each 2   montelukast  (SINGULAIR ) 10 MG tablet Take 1 tablet (10 mg total) by mouth at bedtime. 30 tablet 3   omeprazole  (PRILOSEC) 40 MG capsule Take 1 capsule (40 mg total) by mouth daily. 90 capsule 3   Prasterone  (INTRAROSA ) 6.5 MG INST Insert one capsule vaginally 28 each 3   ramipril  (ALTACE ) 2.5 MG capsule TAKE 1 CAPSULE(2.5 MG) BY MOUTH DAILY 90 capsule 0   albuterol  (VENTOLIN  HFA) 108 (90 Base) MCG/ACT inhaler Inhale 2 puffs into the lungs every 6 (six) hours as needed for wheezing or shortness of breath. 18 g 2   Dulaglutide  (TRULICITY ) 3 MG/0.5ML SOAJ Inject 3 mg as directed once a week. 6 mL 0   fluconazole  (DIFLUCAN ) 150 MG tablet Take 1 tablet (150 mg total) by mouth every 3 (three) days. 2 tablet 0   ondansetron  (ZOFRAN -ODT) 4 MG disintegrating tablet Take 1 tablet (4 mg total) by mouth every 8 (eight) hours as needed for vomiting or nausea. (Patient not taking: Reported on 06/19/2023) 8 tablet 0   promethazine -dextromethorphan (PROMETHAZINE -DM) 6.25-15 MG/5ML syrup Take 5 mLs by mouth 4 (four) times daily as needed for cough. (Patient not taking: Reported on 09/18/2023) 240 mL 0   No facility-administered medications prior to visit.    No Known Allergies     Objective:    BP 128/75 (BP Location: Left Arm, Patient Position: Sitting, Cuff Size: Normal)   Pulse 73   Resp 19   Ht 5' (1.524 m)   Wt 198 lb 6.4 oz (90 kg)   SpO2 99%   BMI 38.75 kg/m  Wt Readings from Last 3 Encounters:  09/18/23 198 lb 6.4 oz (90 kg)  06/19/23 193 lb (87.5 kg)  06/07/23 140 lb (63.5 kg)     Physical Exam       Patient has been counseled extensively about nutrition and exercise as well as the importance of adherence with medications and regular follow-up. The patient was given clear instructions to go to ER or return to medical center if symptoms don't improve, worsen or new problems develop. The patient verbalized understanding.   Follow-up: Return in about 3 months (around 12/18/2023).   Collins Dean, FNP-BC [facility] [place], [place] [phone]  09/18/2023, 2:16 PM

## 2023-09-26 ENCOUNTER — Telehealth: Payer: Self-pay | Admitting: Nurse Practitioner

## 2023-09-26 ENCOUNTER — Other Ambulatory Visit (INDEPENDENT_AMBULATORY_CARE_PROVIDER_SITE_OTHER)

## 2023-09-26 ENCOUNTER — Ambulatory Visit (INDEPENDENT_AMBULATORY_CARE_PROVIDER_SITE_OTHER): Admitting: Orthopaedic Surgery

## 2023-09-26 DIAGNOSIS — M1711 Unilateral primary osteoarthritis, right knee: Secondary | ICD-10-CM | POA: Diagnosis not present

## 2023-09-26 MED ORDER — TRAMADOL HCL 50 MG PO TABS: 50.0000 mg | ORAL_TABLET | Freq: Every day | ORAL | 0 refills | Status: DC | PRN

## 2023-09-26 NOTE — Progress Notes (Signed)
 Office Visit Note   Patient: Ann Foster           Date of Birth: 1965/12/20           MRN: [id number] Visit Date: 09/26/2023              Requested by: Collins Dean, NP [address] [place] [address] [place],  [place] [address] PCP: Collins Dean, NP   Assessment & Plan: Visit Diagnoses:  1. Primary osteoarthritis of right knee     Plan: Assessment and Plan    Osteoarthritis of right knee Chronic osteoarthritis with severe symptoms. X-rays show bone-on-bone contact. Ready for surgical intervention. Informed consent obtained. - Notify surgical coordinator to schedule right knee replacement surgery ASAP. - Order new x-rays of the right knee. - Prescribe tramadol  for pain management. - Provide handout on knee replacement surgery. - Arrange for post-operative physical therapy: 2 weeks in-home, followed by 2 months outpatient.  Type 2 diabetes mellitus Improved glycemic control with Hemoglobin A1c at 7.0, eligible for surgery.      Impression is severe right knee degenerative joint disease secondary to Osteoarthritis.  Bone on bone joint space narrowing is seen on radiographs with mild varus alignment.  At this point, conservative treatments fail to provide any significant relief and the pain is severely affecting ADLs and quality of life.  Based on treatment options, the patient has elected to move forward with a knee replacement.  We have discussed the surgical risks that include but are not limited to infection, DVT, leg length discrepancy, stiffness, numbness, tingling, incomplete relief of pain.  Recovery and prognosis were also reviewed.    Anticoagulants: No antithrombotic Postop anticoagulation: Eliquis Diabetic: Yes  Nickel allergy: No Prior DVT/PE: No Tobacco use: No Clearances needed for surgery: None Anticipated discharge dispo: Home   Follow-Up Instructions: No follow-ups on file.   Orders:  Orders Placed This Encounter  Procedures   XR KNEE 3 VIEW  RIGHT   Meds ordered this encounter  Medications   traMADol  (ULTRAM ) 50 MG tablet    Sig: Take 1-2 tablets (50-100 mg total) by mouth daily as needed.    Dispense:  20 tablet    Refill:  0      Procedures: No procedures performed   Clinical Data: No additional findings.   Subjective: Chief Complaint  Patient presents with   Right Knee - Pain   Left Knee - Pain    HPI Discussed the use of AI scribe software for clinical note transcription with the patient, who gave verbal consent to proceed.  History of Present Illness   Ann Foster is a 58 year old female with osteoarthritis who presents with persistent knee pain and swelling. She was referred by her primary care doctor, Oley Berth, for surgical evaluation.  She experiences persistent pain and swelling in both knees, with the right knee being significantly worse. The pain radiates to her hip, especially when walking or climbing stairs, and is sometimes described as unbearable.  She has received multiple injections in her knees, which provided temporary relief for about a month before the pain returned. She uses tramadol  and Tylenol  for pain management, which are effective when used alternately. Tramadol  provides perfect relief for two days.     Review of Systems  Constitutional: Negative.   HENT: Negative.    Eyes: Negative.   Respiratory: Negative.    Cardiovascular: Negative.   Endocrine: Negative.   Musculoskeletal: Negative.   Neurological: Negative.  Hematological: Negative.   Psychiatric/Behavioral: Negative.    All other systems reviewed and are negative.    Objective: Vital Signs: There were no vitals taken for this visit.  Physical Exam Vitals and nursing note reviewed.  Constitutional:      Appearance: She is well-developed.  HENT:     Head: Normocephalic and atraumatic.     Nose: Nose normal.  Eyes:     Extraocular Movements: Extraocular movements intact.  Cardiovascular:     Pulses:  Normal pulses.  Pulmonary:     Effort: Pulmonary effort is normal.  Abdominal:     Palpations: Abdomen is soft.  Musculoskeletal:     Cervical back: Neck supple.  Skin:    General: Skin is warm.     Capillary Refill: Capillary refill takes less than 2 seconds.  Neurological:     Mental Status: She is alert and oriented to person, place, and time. Mental status is at baseline.  Psychiatric:        Behavior: Behavior normal.        Thought Content: Thought content normal.        Judgment: Judgment normal.     Ortho Exam Physical Exam   MUSCULOSKELETAL: Right knee range of motion 5 to 90 degrees with pain.   Small effusion is present.  There is pain and crepitus with range of motion.  Collaterals and cruciates are stable. Specialty Comments:  No specialty comments available.  Imaging: XR KNEE 3 VIEW RIGHT Result Date: 09/26/2023 X-rays of the right knee show significant osteophytic changes and bone-on-bone joint space narrowing.  Mild varus alignment.    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, with long-term current use of insulin  (HCC) 04/25/2022   Amblyopia of eye, right 07/27/2021   Corneal guttata of left eye 07/27/2021   Influenza vaccine refused 07/29/2020   COVID-19 vaccine dose declined 07/29/2020   Vasomotor symptoms due to menopause 04/24/2020   Atrophic vaginitis 04/24/2020   Impingement syndrome of left shoulder 01/23/2018   Past Medical History:  Diagnosis Date   Ankle fracture 2016   Right   Aortic atherosclerosis (HCC)    trace calcific atherosclerosis aortic arch per ct neck done 12-22-17   Bronchitis    Diabetes mellitus without complication (HCC)    Hypertension    OA (osteoarthritis) of shoulder    left shoulder,  both knees arthritis   Osteoarthritis, knee     Family History  Problem Relation Age of Onset   Breast cancer Mother    Colon cancer Father 62   Diabetes Son    Esophageal cancer Neg Hx    Stomach cancer Neg Hx    Liver disease Neg Hx    Pancreatic cancer Neg Hx    Rectal cancer Neg Hx     Past Surgical History:  Procedure Laterality Date   CHOLECYSTECTOMY N/A 04/15/2023   Procedure: LAPAROSCOPIC CHOLECYSTECTOMY WITH ICG DYE;  Surgeon: Enid Harry, MD;  Location: [facility];  Service: General;  Laterality: N/A;   COLONOSCOPY     NO PAST SURGERIES     Social History   Occupational History   Not on file  Tobacco Use   Smoking status: Former    Current packs/day: 0.00    Types: Cigarettes    Quit  date: 11/28/2019    Years since quitting: 3.8   Smokeless tobacco: Never   Tobacco comments:    smokes 1-2 cigarettes day now  Vaping Use   Vaping status: Never Used  Substance and Sexual Activity   Alcohol use: Not Currently    Comment: Occasional   Drug use: No   Sexual activity: Not Currently    Birth control/protection: Condom

## 2023-09-26 NOTE — Telephone Encounter (Signed)
 Copied from CRM [id number]. Topic: Clinical - Prescription Issue  >> Sep 26, 2023  4:01 PM Everette C wrote: Reason for CRM: The patient shares that their pharmacy has provided them with incorrect needles for their diabetic testing device, rather than their accucheck guide   The patient would like to be contacted by a member of staff when possible to discuss the needles needed for testing and their insurances continued coverage of the prescription   Please contact the patient further when possible

## 2023-09-27 ENCOUNTER — Other Ambulatory Visit: Payer: Self-pay

## 2023-09-27 DIAGNOSIS — Z794 Long term (current) use of insulin: Secondary | ICD-10-CM

## 2023-09-27 MED ORDER — ACCU-CHEK SOFTCLIX LANCETS MISC: 2 refills | Status: AC

## 2023-09-27 NOTE — Telephone Encounter (Signed)
 Refills sent to pharmacy.

## 2023-10-04 ENCOUNTER — Other Ambulatory Visit: Payer: Self-pay

## 2023-10-04 DIAGNOSIS — I872 Venous insufficiency (chronic) (peripheral): Secondary | ICD-10-CM

## 2023-10-13 ENCOUNTER — Ambulatory Visit (HOSPITAL_COMMUNITY)
Admission: RE | Admit: 2023-10-13 | Discharge: 2023-10-13 | Disposition: A | Discharge status: Home / Self Care | Source: Ambulatory Visit | Attending: Vascular Surgery | Admitting: Vascular Surgery

## 2023-10-13 DIAGNOSIS — I872 Venous insufficiency (chronic) (peripheral): Secondary | ICD-10-CM

## 2023-10-23 ENCOUNTER — Encounter: Payer: Self-pay | Admitting: Nurse Practitioner

## 2023-10-24 DIAGNOSIS — G4733 Obstructive sleep apnea (adult) (pediatric): Secondary | ICD-10-CM | POA: Diagnosis not present

## 2023-10-26 NOTE — Progress Notes (Unsigned)
 [facility]           [facility] [place]  History and Physical   Ann Foster is a 58 y.o. female who presents with bilateral leg swelling with right > left.  She states she has some bulging veins on her thighs.  She states that her swelling is better after elevating her legs.  She does have some mild skin color changes at her ankles.  She does wear knee high compression socks.  She does not have any family hx of varicose veins or leg swelling.  She does not have hx of DVT.  She does wear knee high compression.  She does do water aerobics at the [facility].  She states that her legs get itchy and achy with the swelling.  Her legs do feel better wearing compression.she has lost around 80 lbs on Trulicity .  She states that she does try to walk every morning but exercise is hard due to her knees locking and needing knee replacements.    She was seen by Dr. Christiane Cowing with ortho last month and she has been set up for knee replacement surgery in June.     The pt is on a statin for cholesterol management.  The pt is not on a daily aspirin.   Other AC:  none The pt is on ACEI for hypertension.   The pt is  on medication for diabetes.   Tobacco hx:  former  Pt does not have family hx of AAA.  Past Medical History:  Diagnosis Date   Ankle fracture 2016   Right   Aortic atherosclerosis (HCC)    trace calcific atherosclerosis aortic arch per ct neck done 12-22-17   Bronchitis    Diabetes mellitus without complication (HCC)    Hypertension    OA (osteoarthritis) of shoulder    left shoulder, both knees arthritis   Osteoarthritis, knee     Past Surgical History:  Procedure Laterality Date   CHOLECYSTECTOMY N/A 04/15/2023   Procedure: LAPAROSCOPIC CHOLECYSTECTOMY WITH ICG DYE;  Surgeon: Enid Harry, MD;  Location: [facility];  Service: General;  Laterality: N/A;   COLONOSCOPY     NO PAST SURGERIES      Social History   Socioeconomic History   Marital status: Single     Spouse name: Not on file   Number of children: 3   Years of education: Not on file   Highest education level: 12th grade  Occupational History   Not on file  Tobacco Use   Smoking status: Former    Current packs/day: 0.00    Types: Cigarettes    Quit date: 11/28/2019    Years since quitting: 3.9   Smokeless tobacco: Never   Tobacco comments:    smokes 1-2 cigarettes day now  Vaping Use   Vaping status: Never Used  Substance and Sexual Activity   Alcohol use: Not Currently    Comment: Occasional   Drug use: No   Sexual activity: Not Currently    Birth control/protection: Condom  Other Topics Concern   Not on file  Social History Narrative   Left Handed   One story home    Social Drivers of Health   Financial Resource Strain: Low Risk  (09/14/2023)   Overall Financial Resource Strain (CARDIA)    Difficulty of Paying Living Expenses: Not hard at all  Food Insecurity: Food Insecurity Present (09/14/2023)   Hunger Vital Sign    Worried About  Running Out of Food in the Last Year: Sometimes true    Ran Out of Food in the Last Year: Never true  Transportation Needs: No Transportation Needs (09/14/2023)   PRAPARE - Administrator, Civil Service (Medical): No    Lack of Transportation (Non-Medical): No  Physical Activity: Sufficiently Active (09/14/2023)   Exercise Vital Sign    Days of Exercise per Week: 4 days    Minutes of Exercise per Session: 60 min  Stress: No Stress Concern Present (09/14/2023)   [employer] of Occupational Health - Occupational Stress Questionnaire    Feeling of Stress : Not at all  Social Connections: Moderately Isolated (09/14/2023)   Social Connection and Isolation Panel [NHANES]    Frequency of Communication with Friends and Family: More than three times a week    Frequency of Social Gatherings with Friends and Family: More than three times a week    Attends Religious Services: 1 to 4 times per year    Active Member of [employer] or  Organizations: No    Attends Banker Meetings: Never    Marital Status: Never married  Intimate Partner Violence: Not At Risk (04/13/2023)   Humiliation, Afraid, Rape, and Kick questionnaire    Fear of Current or Ex-Partner: No    Emotionally Abused: No    Physically Abused: No    Sexually Abused: No     Family History  Problem Relation Age of Onset   Breast cancer Mother    Colon cancer Father 63   Diabetes Son    Esophageal cancer Neg Hx    Stomach cancer Neg Hx    Liver disease Neg Hx    Pancreatic cancer Neg Hx    Rectal cancer Neg Hx     Current Outpatient Medications  Medication Sig Dispense Refill   Accu-Chek Softclix Lancets lancets Check blood glucose level by fingerstick once per day. E11.65 100 each 2   albuterol  (VENTOLIN  HFA) 108 (90 Base) MCG/ACT inhaler Inhale 2 puffs into the lungs every 6 (six) hours as needed for wheezing or shortness of breath. 18 g 2   atorvastatin  (LIPITOR) 80 MG tablet Take 1 tablet (80 mg total) by mouth daily. 90 tablet 1   Blood Glucose Monitoring Suppl (ACCU-CHEK GUIDE) w/Device KIT Check blood glucose level by fingerstick once per day. E11.65 1 kit 0   buPROPion  (WELLBUTRIN  SR) 150 MG 12 hr tablet TAKE 1 TABLET(150 MG) BY MOUTH TWICE DAILY 60 tablet 1   busPIRone  (BUSPAR ) 10 MG tablet TAKE 1 TABLET(10 MG) BY MOUTH TWICE DAILY 60 tablet 1   cyclobenzaprine  (FLEXERIL ) 5 MG tablet Take 1 tablet (5 mg total) by mouth at bedtime as needed for muscle spasms. 30 tablet 1   cyclopentolate (CYCLODRYL,CYCLOGYL) 1 % ophthalmic solution      dapagliflozin  propanediol (FARXIGA ) 5 MG TABS tablet TAKE 1 TABLET BY MOUTH ONCE  DAILY BEFORE BREAKFAST 100 tablet 1   Dulaglutide  (TRULICITY ) 3 MG/0.5ML SOAJ Inject 3 mg as directed once a week. 6 mL 0   estradiol  (ESTRACE ) 0.1 MG/GM vaginal cream Apply 1 gram per vagina every night for 2 weeks, then apply three times a week 42.5 g 12   fluconazole  (DIFLUCAN ) 150 MG tablet Take 1 tablet (150 mg  total) by mouth every 3 (three) days. 2 tablet 0   glucose blood (ACCU-CHEK GUIDE) test strip Check blood glucose level by fingerstick once per day. E11.65 100 each 2   Insulin  Pen Needle (B-D UF III  MINI PEN NEEDLES) 31G X 5 MM MISC Use as instructed. Inject into the skin once weekly 100 each 1   montelukast  (SINGULAIR ) 10 MG tablet Take 1 tablet (10 mg total) by mouth at bedtime. 30 tablet 3   omeprazole  (PRILOSEC) 40 MG capsule Take 1 capsule (40 mg total) by mouth daily. 90 capsule 3   Prasterone  (INTRAROSA ) 6.5 MG INST Insert one capsule vaginally 28 each 3   ramipril  (ALTACE ) 2.5 MG capsule TAKE 1 CAPSULE(2.5 MG) BY MOUTH DAILY 90 capsule 0   traMADol  (ULTRAM ) 50 MG tablet Take 1-2 tablets (50-100 mg total) by mouth daily as needed. 20 tablet 0   No current facility-administered medications for this visit.    No Known Allergies  REVIEW OF SYSTEMS:   [X]  denotes positive finding, [ ]  denotes negative finding Cardiac  Comments:  Chest pain or chest pressure:    Shortness of breath upon exertion:    Short of breath when lying flat:    Irregular heart rhythm:        Vascular    Pain in calf, thigh, or hip brought on by ambulation:    Pain in feet at night that wakes you up from your sleep:     Blood clot in your veins:    Leg swelling:  x       Pulmonary    Oxygen at home:    Productive cough:     Wheezing:         Neurologic    Sudden weakness in arms or legs:     Sudden numbness in arms or legs:     Sudden onset of difficulty speaking or slurred speech:    Temporary loss of vision in one eye:     Problems with dizziness:         Gastrointestinal    Blood in stool:     Vomited blood:         Genitourinary    Burning when urinating:     Blood in urine:        Psychiatric    Major depression:         Hematologic    Bleeding problems:    Problems with blood clotting too easily:        Skin    Rashes or ulcers:        Constitutional    Fever or chills:       PHYSICAL EXAMINATION:  Today's Vitals   10/27/23 0804  BP: 134/85  Pulse: 78  Temp: 98.4 F (36.9 C)  TempSrc: Temporal  SpO2: 94%  Weight: 197 lb 14.4 oz (89.8 kg)  Height: 5' (1.524 m)  PainSc: 0-No pain   Body mass index is 38.65 kg/m.   General:  WDWN in NAD; vital signs documented above Gait: Not observed HENT: WNL, normocephalic Pulmonary: normal non-labored breathing without wheezing Cardiac: regular HR; without carotid bruits Abdomen: soft, NT, aortic pulse is not palpable Skin: without rashes Vascular Exam/Pulses:  Right Left  Radial 2+ (normal) 2+ (normal)  DP 2+ (normal) 2+ (normal)   Extremities: mild BLE swelling with right < left; mild hemosiderin staining at the ankles bilaterally.   Right inner thigh   Right outer thigh   Left outer thigh    Neurologic: A&O X 3;  moving all extremities equally Psychiatric:  The pt has Normal affect.   Non-Invasive Vascular Imaging:   Venous duplex on 10/13/2023: Venous Reflux Times  +--------------+---------+------+-----------+------------+--------+  RIGHT  Reflux NoRefluxReflux TimeDiameter cmsComments                          Yes                                   +--------------+---------+------+-----------+------------+--------+  CFV          no                                              +--------------+---------+------+-----------+------------+--------+  FV prox       no                                              +--------------+---------+------+-----------+------------+--------+  Popliteal    no                                              +--------------+---------+------+-----------+------------+--------+  GSV at SFJ    no                            .91               +--------------+---------+------+-----------+------------+--------+  GSV prox thighno                            .58                +--------------+---------+------+-----------+------------+--------+  GSV mid thigh no                            .40               +--------------+---------+------+-----------+------------+--------+  GSV dist thighno                            .35               +--------------+---------+------+-----------+------------+--------+  GSV at knee   no                            .38               +--------------+---------+------+-----------+------------+--------+  GSV prox calf no                            .22               +--------------+---------+------+-----------+------------+--------+  GSV mid calf            yes    >500 ms      .26               +--------------+---------+------+-----------+------------+--------+  GSV dist calf           yes    >500 ms      .23               +--------------+---------+------+-----------+------------+--------+  SSV Pop Fossa no                            .35               +--------------+---------+------+-----------+------------+--------+  SSV prox calf no                            .20               +--------------+---------+------+-----------+------------+--------+  SSV mid calf  no                            .19               +--------------+---------+------+-----------+------------+--------+   Summary:  Right:  - No evidence of deep vein thrombosis seen in the right lower extremity,  from the common femoral through the popliteal veins.  - No evidence of superficial venous thrombosis in the right lower extremity.  - No evidence of superficial venous reflux seen in the right short saphenous vein.  - Venous reflux is noted in the right greater saphenous vein in the calf.     Ann Foster is a 58 y.o. female who presents with: BLE swelling with right > left and spider veins and varicosities as pictured.     -pt has easily palpable DP pedal pulses bilaterally -pt does not have  evidence of DVT.  Pt does have venous reflux in the right GSV in the mid and distal calf, however, vein is small and there is no reflux at the [facility] and therefore she is not a candidate for laser ablation.   -discussed with pt about wearing knee high 15-20 mmHg compression stockings and pt was measured for these today.   Also discussed that if she pursues sclerotherapy, she would need to wear thigh high stockings and she was measured for these today and size will be put in her chart.   -discussed the importance of leg elevation and how to elevate properly - pt is advised to elevate their legs and a diagram is given to them to demonstrate for pt to lay flat on their back with knees elevated and slightly bent with their feet higher than their knees, which puts their feet higher than their heart for 15 minutes per day.  If pt cannot lay flat, advised to lay as flat as possible.  -pt is advised to continue as much walking as possible and avoid sitting or standing for long periods of time.  -discussed importance of weight loss and exercise and that water aerobics would also be beneficial.  [facility].  She has lost about 80 lbs on Trulicity .   -handout with recommendations given -pt will f/u as needed.  Will have RN call her to discuss sclerotherapy and if she decides to pursue this, her appt will be made at that time.    Ann Foster, [facility] [phone]  Clinic MD:  Susi Eric

## 2023-10-27 ENCOUNTER — Ambulatory Visit: Attending: Vascular Surgery | Admitting: Physician Assistant

## 2023-10-27 ENCOUNTER — Encounter: Payer: Self-pay | Admitting: Physician Assistant

## 2023-10-27 VITALS — BP 134/85 | HR 78 | Temp 98.4°F | Ht 60.0 in | Wt 197.9 lb

## 2023-10-27 DIAGNOSIS — M7989 Other specified soft tissue disorders: Secondary | ICD-10-CM

## 2023-10-29 DIAGNOSIS — G4733 Obstructive sleep apnea (adult) (pediatric): Secondary | ICD-10-CM | POA: Diagnosis not present

## 2023-11-08 ENCOUNTER — Encounter (HOSPITAL_COMMUNITY): Payer: Self-pay

## 2023-11-08 NOTE — Pre-Procedure Instructions (Signed)
 Surgical Instructions   Your procedure is scheduled on Monday, June 23rd. Report to [facility] Main Entrance A at 06:00 A.M., then check in with the Admitting office. Any questions or running late day of surgery: call [phone]  Questions prior to your surgery date: call [phone], Monday-Friday, 8am-4pm. If you experience any cold or flu symptoms such as cough, fever, chills, shortness of breath, etc. between now and your scheduled surgery, please notify us  at the above number.     Remember:  Do not eat after midnight the night before your surgery  You may drink clear liquids until 05:30 AM the morning of your surgery.   Clear liquids allowed are: Water, Non-Citrus Juices (without pulp), Carbonated Beverages, Clear Tea (no milk, honey, etc.), Black Coffee Only (NO MILK, CREAM OR POWDERED CREAMER of any kind), and Gatorade.  Patient Instructions  The night before surgery:  No food after midnight. ONLY clear liquids after midnight   The day of surgery (if you have diabetes): Drink ONE (1) 12 oz G2 given to you in your pre admission testing appointment by 05:30 AM the morning of surgery. Drink in one sitting. Do not sip.  This drink was given to you during your hospital  pre-op appointment visit.  Nothing else to drink after completing the  12 oz bottle of G2.         If you have questions, please contact your surgeon's office.    Take these medicines the morning of surgery with A SIP OF WATER  atorvastatin  (LIPITOR)  buPROPion  (WELLBUTRIN  SR)  busPIRone  (BUSPAR )  omeprazole  (PRILOSEC)    May take these medicines IF NEEDED: albuterol  (VENTOLIN  HFA)- bring inhaler with you on day of surgery fluticasone  (FLONASE )  traMADol  (ULTRAM )    One week prior to surgery, STOP taking any Aspirin (unless otherwise instructed by your surgeon) Aleve , Naproxen , Ibuprofen , Motrin , Advil , Goody's, BC's, all herbal medications, fish oil, and non-prescription vitamins.  WHAT DO I DO  ABOUT MY DIABETES MEDICATION?   STOP taking dapagliflozin  propanediol (FARXIGA ) 3 days prior to surgery. Last dose 6/19.  STOP taking  Dulaglutide  (TRULICITY ) 7 days prior to surgery. Last dose on or before 6/15.      HOW TO MANAGE YOUR DIABETES BEFORE AND AFTER SURGERY  Why is it important to control my blood sugar before and after surgery? Improving blood sugar levels before and after surgery helps healing and can limit problems. A way of improving blood sugar control is eating a healthy diet by:  Eating less sugar and carbohydrates  Increasing activity/exercise  Talking with your doctor about reaching your blood sugar goals High blood sugars (greater than 180 mg/dL) can raise your risk of infections and slow your recovery, so you will need to focus on controlling your diabetes during the weeks before surgery. Make sure that the doctor who takes care of your diabetes knows about your planned surgery including the date and location.  How do I manage my blood sugar before surgery? Check your blood sugar at least 4 times a day, starting 2 days before surgery, to make sure that the level is not too high or low.  Check your blood sugar the morning of your surgery when you wake up and every 2 hours until you get to the Short Stay unit.  If your blood sugar is less than 70 mg/dL, you will need to treat for low blood sugar: Do not take insulin . Treat a low blood sugar (less than 70 mg/dL) with  cup of  clear juice (cranberry or apple), 4 glucose tablets, OR glucose gel. Recheck blood sugar in 15 minutes after treatment (to make sure it is greater than 70 mg/dL). If your blood sugar is not greater than 70 mg/dL on recheck, call [phone] for further instructions. Report your blood sugar to the short stay nurse when you get to Short Stay.  If you are admitted to the hospital after surgery: Your blood sugar will be checked by the staff and you will probably be given insulin  after surgery  (instead of oral diabetes medicines) to make sure you have good blood sugar levels. The goal for blood sugar control after surgery is 80-180 mg/dL.                     Do NOT Smoke (Tobacco/Vaping) for 24 hours prior to your procedure.  If you use a CPAP at night, you may bring your mask/headgear for your overnight stay.   You will be asked to remove any contacts, glasses, piercing's, hearing aid's, dentures/partials prior to surgery. Please bring cases for these items if needed.    Patients discharged the day of surgery will not be allowed to drive home, and someone needs to stay with them for 24 hours.  SURGICAL WAITING ROOM VISITATION Patients may have no more than 2 support people in the waiting area - these visitors may rotate.   Pre-op nurse will coordinate an appropriate time for 1 ADULT support person, who may not rotate, to accompany patient in pre-op.  Children under the age of 31 must have an adult with them who is not the patient and must remain in the main waiting area with an adult.  If the patient needs to stay at the hospital during part of their recovery, the visitor guidelines for inpatient rooms apply.  Please refer to the [facility] website for the visitor guidelines for any additional information.   If you received a COVID test during your pre-op visit  it is requested that you wear a mask when out in public, stay away from anyone that may not be feeling well and notify your surgeon if you develop symptoms. If you have been in contact with anyone that has tested positive in the last 10 days please notify you surgeon.      Pre-operative 5 CHG Bathing Instructions   You can play a key role in reducing the risk of infection after surgery. Your skin needs to be as free of germs as possible. You can reduce the number of germs on your skin by washing with CHG (chlorhexidine  gluconate) soap before surgery. CHG is an antiseptic soap that kills germs and continues to kill  germs even after washing.   DO NOT use if you have an allergy to chlorhexidine /CHG or antibacterial soaps. If your skin becomes reddened or irritated, stop using the CHG and notify one of our RNs at [phone].   Please shower with the CHG soap starting 4 days before surgery using the following schedule:     Please keep in mind the following:  DO NOT shave, including legs and underarms, starting the day of your first shower.   You may shave your face at any point before/day of surgery.  Place clean sheets on your bed the day you start using CHG soap. Use a clean washcloth (not used since being washed) for each shower. DO NOT sleep with pets once you start using the CHG.   CHG Shower Instructions:  Oncologist and  private area with normal soap. If you choose to wash your hair, wash first with your normal shampoo.  After you use shampoo/soap, rinse your hair and body thoroughly to remove shampoo/soap residue.  Turn the water OFF and apply about 3 tablespoons (45 ml) of CHG soap to a CLEAN washcloth.  Apply CHG soap ONLY FROM YOUR NECK DOWN TO YOUR TOES (washing for 3-5 minutes)  DO NOT use CHG soap on face, private areas, open wounds, or sores.  Pay special attention to the area where your surgery is being performed.  If you are having back surgery, having someone wash your back for you may be helpful. Wait 2 minutes after CHG soap is applied, then you may rinse off the CHG soap.  Pat dry with a clean towel  Put on clean clothes/pajamas   If you choose to wear lotion, please use ONLY the CHG-compatible lotions that are listed below.  Additional instructions for the day of surgery: DO NOT APPLY any lotions, deodorants, cologne, or perfumes.   Do not bring valuables to the hospital. [facility] is not responsible for any belongings/valuables. Do not wear nail polish, gel polish, artificial nails, or any other type of covering on natural nails (fingers and toes) Do not wear jewelry or  makeup Put on clean/comfortable clothes.  Please brush your teeth.  Ask your nurse before applying any prescription medications to the skin.     CHG Compatible Lotions   Aveeno Moisturizing lotion  Cetaphil Moisturizing Cream  Cetaphil Moisturizing Lotion  Clairol Herbal Essence Moisturizing Lotion, Dry Skin  Clairol Herbal Essence Moisturizing Lotion, Extra Dry Skin  Clairol Herbal Essence Moisturizing Lotion, Normal Skin  Curel Age Defying Therapeutic Moisturizing Lotion with Alpha Hydroxy  Curel Extreme Care Body Lotion  Curel Soothing Hands Moisturizing Hand Lotion  Curel Therapeutic Moisturizing Cream, Fragrance-Free  Curel Therapeutic Moisturizing Lotion, Fragrance-Free  Curel Therapeutic Moisturizing Lotion, Original Formula  Eucerin Daily Replenishing Lotion  Eucerin Dry Skin Therapy Plus Alpha Hydroxy Crme  Eucerin Dry Skin Therapy Plus Alpha Hydroxy Lotion  Eucerin Original Crme  Eucerin Original Lotion  Eucerin Plus Crme Eucerin Plus Lotion  Eucerin TriLipid Replenishing Lotion  Keri Anti-Bacterial Hand Lotion  Keri Deep Conditioning Original Lotion Dry Skin Formula Softly Scented  Keri Deep Conditioning Original Lotion, Fragrance Free Sensitive Skin Formula  Keri Lotion Fast Absorbing Fragrance Free Sensitive Skin Formula  Keri Lotion Fast Absorbing Softly Scented Dry Skin Formula  Keri Original Lotion  Keri Skin Renewal Lotion Keri Silky Smooth Lotion  Keri Silky Smooth Sensitive Skin Lotion  Nivea Body Creamy Conditioning Oil  Nivea Body Extra Enriched Lotion  Nivea Body Original Lotion  Nivea Body Sheer Moisturizing Lotion Nivea Crme  Nivea Skin Firming Lotion  NutraDerm 30 Skin Lotion  NutraDerm Skin Lotion  NutraDerm Therapeutic Skin Cream  NutraDerm Therapeutic Skin Lotion  ProShield Protective Hand Cream  Provon moisturizing lotion  Please read over the following fact sheets that you were given.

## 2023-11-09 ENCOUNTER — Encounter (HOSPITAL_COMMUNITY): Payer: Self-pay

## 2023-11-09 ENCOUNTER — Other Ambulatory Visit: Payer: Self-pay

## 2023-11-09 ENCOUNTER — Encounter (HOSPITAL_COMMUNITY)
Admission: RE | Admit: 2023-11-09 | Discharge: 2023-11-09 | Disposition: A | Discharge status: Home / Self Care | Source: Ambulatory Visit | Attending: Orthopaedic Surgery | Admitting: Orthopaedic Surgery

## 2023-11-09 VITALS — BP 133/75 | HR 81 | Temp 98.4°F | Resp 17 | Ht 62.0 in | Wt 196.4 lb

## 2023-11-09 DIAGNOSIS — Z01812 Encounter for preprocedural laboratory examination: Secondary | ICD-10-CM | POA: Insufficient documentation

## 2023-11-09 DIAGNOSIS — M1711 Unilateral primary osteoarthritis, right knee: Secondary | ICD-10-CM | POA: Diagnosis not present

## 2023-11-09 DIAGNOSIS — Z01818 Encounter for other preprocedural examination: Secondary | ICD-10-CM

## 2023-11-09 DIAGNOSIS — Z794 Long term (current) use of insulin: Secondary | ICD-10-CM | POA: Diagnosis not present

## 2023-11-09 DIAGNOSIS — E113212 Type 2 diabetes mellitus with mild nonproliferative diabetic retinopathy with macular edema, left eye: Secondary | ICD-10-CM | POA: Insufficient documentation

## 2023-11-09 HISTORY — DX: Sleep apnea, unspecified: G47.30

## 2023-11-09 LAB — CBC
HCT: 40.4 % (ref 36.0–46.0)
Hemoglobin: 12.9 g/dL (ref 12.0–15.0)
MCH: 31.2 pg (ref 26.0–34.0)
MCHC: 31.9 g/dL (ref 30.0–36.0)
MCV: 97.6 fL (ref 80.0–100.0)
Platelets: 311 10*3/uL (ref 150–400)
RBC: 4.14 MIL/uL (ref 3.87–5.11)
RDW: 13 % (ref 11.5–15.5)
WBC: 8.1 10*3/uL (ref 4.0–10.5)
nRBC: 0 % (ref 0.0–0.2)

## 2023-11-09 LAB — BASIC METABOLIC PANEL WITH GFR
Anion gap: 5 (ref 5–15)
BUN: 13 mg/dL (ref 6–20)
CO2: 28 mmol/L (ref 22–32)
Calcium: 10 mg/dL (ref 8.9–10.3)
Chloride: 107 mmol/L (ref 98–111)
Creatinine, Ser: 0.91 mg/dL (ref 0.44–1.00)
GFR, Estimated: >60 mL/min (ref >60)
Glucose, Bld: 104 mg/dL — ABNORMAL HIGH (ref 70–99)
Potassium: 3.8 mmol/L (ref 3.5–5.1)
Sodium: 140 mmol/L (ref 135–145)

## 2023-11-09 LAB — HEMOGLOBIN A1C
Hgb A1c MFr Bld: 7 % — ABNORMAL HIGH (ref 4.8–5.6)
Mean Plasma Glucose: 154.2 mg/dL

## 2023-11-09 LAB — SURGICAL PCR SCREEN
MRSA, PCR: NEGATIVE
Staphylococcus aureus: NEGATIVE

## 2023-11-09 LAB — GLUCOSE, CAPILLARY: Glucose-Capillary: 102 mg/dL — ABNORMAL HIGH (ref 70–99)

## 2023-11-09 NOTE — Progress Notes (Signed)
 PCP - Winda Hastings, NP Cardiologist - denies  PPM/ICD - denies   Chest x-ray - 10/12/22 EKG - 04/05/23 Stress Test - denies ECHO - denies Cardiac Cath - denies  Sleep Study - OSA+ CPAP - nightly, pressure settings 4  Fasting Blood Sugar - 100-110 Checks Blood Sugar once a day  Last dose of GLP1 agonist-  11/06/23 GLP1 instructions: Hold 7 days prior to surgery, Last dose on or before 6/15.  ASA/Blood Thinner Instructions: n/a    ERAS Protcol - clears until 0530 PRE-SURGERY G2- given  COVID TEST- n/a   Anesthesia review: no  Patient denies shortness of breath, fever, cough and chest pain at PAT appointment   All instructions explained to the patient, with a verbal understanding of the material. Patient agrees to go over the instructions while at home for a better understanding. The opportunity to ask questions was provided.

## 2023-11-14 ENCOUNTER — Other Ambulatory Visit: Payer: Self-pay | Admitting: Physician Assistant

## 2023-11-14 MED ORDER — ONDANSETRON HCL 4 MG PO TABS: 4.0000 mg | ORAL_TABLET | Freq: Three times a day (TID) | ORAL | 0 refills | Status: DC | PRN

## 2023-11-14 MED ORDER — DOXYCYCLINE HYCLATE 100 MG PO TABS: 100.0000 mg | ORAL_TABLET | Freq: Two times a day (BID) | ORAL | 0 refills | Status: DC

## 2023-11-14 MED ORDER — OXYCODONE-ACETAMINOPHEN 5-325 MG PO TABS: 1.0000 | ORAL_TABLET | Freq: Four times a day (QID) | ORAL | 0 refills | Status: DC | PRN

## 2023-11-14 MED ORDER — METHOCARBAMOL 750 MG PO TABS: 750.0000 mg | ORAL_TABLET | Freq: Three times a day (TID) | ORAL | 2 refills | Status: DC | PRN

## 2023-11-14 MED ORDER — DOCUSATE SODIUM 100 MG PO CAPS: 100.0000 mg | ORAL_CAPSULE | Freq: Every day | ORAL | 2 refills | Status: AC | PRN

## 2023-11-17 ENCOUNTER — Telehealth: Payer: Self-pay | Admitting: *Deleted

## 2023-11-17 ENCOUNTER — Other Ambulatory Visit: Payer: Self-pay | Admitting: *Deleted

## 2023-11-17 DIAGNOSIS — M1711 Unilateral primary osteoarthritis, right knee: Secondary | ICD-10-CM

## 2023-11-17 MED ORDER — TRANEXAMIC ACID 1000 MG/10ML IV SOLN
2000.0000 mg | INTRAVENOUS | Status: DC
  Filled 2023-11-17: qty 20

## 2023-11-17 NOTE — Telephone Encounter (Signed)
 [facility] RNCM pre-op  call completed.

## 2023-11-17 NOTE — Care Plan (Signed)
 [facility] RNCM spoke with patient to discuss her upcoming Right total knee arthroplasty with Dr. Christiane Cowing on 11/[facility]. She is agreeable to case management. She has a daughter and nephew that will be staying with her to assist in care after discharge home. She did receive a CPM through [employer], but will need a RW. Anticipate HHPT will be needed. Referral made to [facility] after choice provided. Reviewed all post op care instructions and emailed to patient. Will continue to follow for needs.

## 2023-11-20 ENCOUNTER — Ambulatory Visit (HOSPITAL_COMMUNITY)

## 2023-11-20 ENCOUNTER — Other Ambulatory Visit: Payer: Self-pay

## 2023-11-20 ENCOUNTER — Encounter (HOSPITAL_COMMUNITY)
Admission: RE | Disposition: A | Discharge status: Home / Self Care | Payer: Self-pay | Source: Home / Self Care | Attending: Orthopaedic Surgery

## 2023-11-20 ENCOUNTER — Observation Stay (HOSPITAL_COMMUNITY)
Admission: RE | Admit: 2023-11-20 | Discharge: 2023-11-21 | Disposition: A | Discharge status: Home / Self Care | Attending: Orthopaedic Surgery | Admitting: Orthopaedic Surgery

## 2023-11-20 ENCOUNTER — Encounter (HOSPITAL_COMMUNITY): Payer: Self-pay | Admitting: Orthopaedic Surgery

## 2023-11-20 ENCOUNTER — Other Ambulatory Visit (HOSPITAL_COMMUNITY): Payer: Self-pay

## 2023-11-20 ENCOUNTER — Observation Stay (HOSPITAL_COMMUNITY)

## 2023-11-20 ENCOUNTER — Other Ambulatory Visit: Payer: Self-pay | Admitting: Physician Assistant

## 2023-11-20 DIAGNOSIS — R609 Edema, unspecified: Secondary | ICD-10-CM | POA: Diagnosis not present

## 2023-11-20 DIAGNOSIS — Z87891 Personal history of nicotine dependence: Secondary | ICD-10-CM | POA: Insufficient documentation

## 2023-11-20 DIAGNOSIS — Z79899 Other long term (current) drug therapy: Secondary | ICD-10-CM | POA: Diagnosis not present

## 2023-11-20 DIAGNOSIS — Z96651 Presence of right artificial knee joint: Secondary | ICD-10-CM

## 2023-11-20 DIAGNOSIS — I10 Essential (primary) hypertension: Secondary | ICD-10-CM | POA: Diagnosis not present

## 2023-11-20 DIAGNOSIS — G8918 Other acute postprocedural pain: Secondary | ICD-10-CM | POA: Diagnosis not present

## 2023-11-20 DIAGNOSIS — M1711 Unilateral primary osteoarthritis, right knee: Secondary | ICD-10-CM

## 2023-11-20 DIAGNOSIS — E119 Type 2 diabetes mellitus without complications: Secondary | ICD-10-CM | POA: Insufficient documentation

## 2023-11-20 DIAGNOSIS — Z794 Long term (current) use of insulin: Secondary | ICD-10-CM | POA: Diagnosis not present

## 2023-11-20 DIAGNOSIS — M25561 Pain in right knee: Secondary | ICD-10-CM | POA: Diagnosis not present

## 2023-11-20 DIAGNOSIS — Z7984 Long term (current) use of oral hypoglycemic drugs: Secondary | ICD-10-CM | POA: Insufficient documentation

## 2023-11-20 DIAGNOSIS — Z471 Aftercare following joint replacement surgery: Secondary | ICD-10-CM | POA: Diagnosis not present

## 2023-11-20 HISTORY — PX: TOTAL KNEE ARTHROPLASTY: SHX125

## 2023-11-20 LAB — GLUCOSE, CAPILLARY
Glucose-Capillary: 163 mg/dL — ABNORMAL HIGH (ref 70–99)
Glucose-Capillary: 132 mg/dL — ABNORMAL HIGH (ref 70–99)
Glucose-Capillary: 131 mg/dL — ABNORMAL HIGH (ref 70–99)
Glucose-Capillary: 171 mg/dL — ABNORMAL HIGH (ref 70–99)
Glucose-Capillary: 142 mg/dL — ABNORMAL HIGH (ref 70–99)

## 2023-11-20 SURGERY — ARTHROPLASTY, KNEE, TOTAL
Anesthesia: Spinal | Site: Knee | Laterality: Right

## 2023-11-20 MED ORDER — BUSPIRONE HCL 10 MG PO TABS
10.0000 mg | ORAL_TABLET | Freq: Two times a day (BID) | ORAL | Status: DC
  Administered 2023-11-20 – 2023-11-21 (×3): 10 mg via ORAL
  Filled 2023-11-20 (×3): qty 1

## 2023-11-20 MED ORDER — METOCLOPRAMIDE HCL 5 MG PO TABS: 5.0000 mg | ORAL_TABLET | Freq: Three times a day (TID) | ORAL | Status: DC | PRN

## 2023-11-20 MED ORDER — CEFAZOLIN SODIUM-DEXTROSE 2-4 GM/100ML-% IV SOLN
2.0000 g | INTRAVENOUS | Status: AC
  Administered 2023-11-20: 2 g via INTRAVENOUS
  Filled 2023-11-20: qty 100

## 2023-11-20 MED ORDER — MENTHOL 3 MG MT LOZG: 1.0000 | LOZENGE | OROMUCOSAL | Status: DC | PRN

## 2023-11-20 MED ORDER — INSULIN ASPART 100 UNIT/ML IJ SOLN: 0.0000 [IU] | Freq: Every day | INTRAMUSCULAR | Status: DC

## 2023-11-20 MED ORDER — METOCLOPRAMIDE HCL 5 MG/ML IJ SOLN
5.0000 mg | Freq: Three times a day (TID) | INTRAMUSCULAR | Status: DC | PRN
  Administered 2023-11-20 – 2023-11-21 (×2): 10 mg via INTRAVENOUS
  Filled 2023-11-20 (×2): qty 2

## 2023-11-20 MED ORDER — MIDAZOLAM HCL 2 MG/2ML IJ SOLN
INTRAMUSCULAR | Status: AC
  Filled 2023-11-20: qty 2

## 2023-11-20 MED ORDER — ORAL CARE MOUTH RINSE
15.0000 mL | Freq: Once | OROMUCOSAL | Status: AC
Start: 2023-11-20 — End: 2023-11-20

## 2023-11-20 MED ORDER — POVIDONE-IODINE 10 % EX SWAB
2.0000 | Freq: Once | CUTANEOUS | Status: AC
  Administered 2023-11-20: 2 via TOPICAL

## 2023-11-20 MED ORDER — PHENOL 1.4 % MT LIQD: 1.0000 | OROMUCOSAL | Status: DC | PRN

## 2023-11-20 MED ORDER — OXYCODONE HCL 5 MG PO TABS: 5.0000 mg | ORAL_TABLET | Freq: Once | ORAL | Status: DC | PRN

## 2023-11-20 MED ORDER — KETOROLAC TROMETHAMINE 15 MG/ML IJ SOLN
7.5000 mg | Freq: Four times a day (QID) | INTRAMUSCULAR | Status: AC
  Administered 2023-11-20 – 2023-11-21 (×4): 7.5 mg via INTRAVENOUS
  Filled 2023-11-20 (×4): qty 1

## 2023-11-20 MED ORDER — TRANEXAMIC ACID-NACL 1000-0.7 MG/100ML-% IV SOLN
1000.0000 mg | Freq: Once | INTRAVENOUS | Status: AC
  Administered 2023-11-20: 1000 mg via INTRAVENOUS
  Filled 2023-11-20: qty 100

## 2023-11-20 MED ORDER — BUPIVACAINE-MELOXICAM ER 400-12 MG/14ML IJ SOLN
INTRAMUSCULAR | Status: DC | PRN
  Administered 2023-11-20: 400 mg

## 2023-11-20 MED ORDER — ACETAMINOPHEN 10 MG/ML IV SOLN: 1000.0000 mg | Freq: Once | INTRAVENOUS | Status: DC | PRN

## 2023-11-20 MED ORDER — OXYCODONE HCL 5 MG/5ML PO SOLN: 5.0000 mg | Freq: Once | ORAL | Status: DC | PRN

## 2023-11-20 MED ORDER — FENTANYL CITRATE (PF) 100 MCG/2ML IJ SOLN
INTRAMUSCULAR | Status: AC
  Filled 2023-11-20: qty 2

## 2023-11-20 MED ORDER — INSULIN ASPART 100 UNIT/ML IJ SOLN
0.0000 [IU] | Freq: Three times a day (TID) | INTRAMUSCULAR | Status: DC
  Administered 2023-11-20: 4 [IU] via SUBCUTANEOUS
  Administered 2023-11-21: 3 [IU] via SUBCUTANEOUS

## 2023-11-20 MED ORDER — FENTANYL CITRATE (PF) 250 MCG/5ML IJ SOLN
INTRAMUSCULAR | Status: AC
  Filled 2023-11-20: qty 5

## 2023-11-20 MED ORDER — ROPIVACAINE HCL 5 MG/ML IJ SOLN
INTRAMUSCULAR | Status: DC | PRN
  Administered 2023-11-20: 20 mL via PERINEURAL

## 2023-11-20 MED ORDER — TRANEXAMIC ACID 1000 MG/10ML IV SOLN
INTRAVENOUS | Status: DC | PRN
  Administered 2023-11-20: 2000 mg via TOPICAL

## 2023-11-20 MED ORDER — HYDROMORPHONE HCL 1 MG/ML IJ SOLN: 0.5000 mg | INTRAMUSCULAR | Status: DC | PRN

## 2023-11-20 MED ORDER — ACETAMINOPHEN 325 MG PO TABS: 325.0000 mg | ORAL_TABLET | Freq: Four times a day (QID) | ORAL | Status: DC | PRN

## 2023-11-20 MED ORDER — METHOCARBAMOL 1000 MG/10ML IJ SOLN: 500.0000 mg | Freq: Four times a day (QID) | INTRAMUSCULAR | Status: DC | PRN

## 2023-11-20 MED ORDER — SODIUM CHLORIDE 0.9 % IR SOLN
Status: DC | PRN
  Administered 2023-11-20: 3000 mL

## 2023-11-20 MED ORDER — ONDANSETRON HCL 4 MG/2ML IJ SOLN
4.0000 mg | Freq: Four times a day (QID) | INTRAMUSCULAR | Status: DC | PRN
  Administered 2023-11-20: 4 mg via INTRAVENOUS
  Filled 2023-11-20: qty 2

## 2023-11-20 MED ORDER — INSULIN ASPART 100 UNIT/ML IJ SOLN
0.0000 [IU] | INTRAMUSCULAR | Status: DC | PRN
  Administered 2023-11-20: 2 [IU] via SUBCUTANEOUS
  Filled 2023-11-20: qty 1

## 2023-11-20 MED ORDER — PROPOFOL 10 MG/ML IV BOLUS
INTRAVENOUS | Status: DC | PRN
  Administered 2023-11-20: 40 ug/kg/min via INTRAVENOUS
  Administered 2023-11-20: 30 mg via INTRAVENOUS

## 2023-11-20 MED ORDER — MIDAZOLAM HCL 2 MG/2ML IJ SOLN
INTRAMUSCULAR | Status: DC | PRN
  Administered 2023-11-20: 2 mg via INTRAVENOUS

## 2023-11-20 MED ORDER — ACETAMINOPHEN 500 MG PO TABS
1000.0000 mg | ORAL_TABLET | Freq: Four times a day (QID) | ORAL | Status: AC
Start: 2023-11-20 — End: 2023-11-21
  Administered 2023-11-20 – 2023-11-21 (×4): 1000 mg via ORAL
  Filled 2023-11-20 (×4): qty 2

## 2023-11-20 MED ORDER — OXYCODONE HCL 5 MG PO TABS
5.0000 mg | ORAL_TABLET | ORAL | Status: DC | PRN
  Filled 2023-11-20 (×3): qty 2

## 2023-11-20 MED ORDER — DEXAMETHASONE SODIUM PHOSPHATE 10 MG/ML IJ SOLN
INTRAMUSCULAR | Status: DC | PRN
  Administered 2023-11-20: 10 mg via INTRAVENOUS

## 2023-11-20 MED ORDER — VANCOMYCIN HCL 1000 MG IV SOLR
INTRAVENOUS | Status: DC | PRN
  Administered 2023-11-20: 1000 mg

## 2023-11-20 MED ORDER — BUPIVACAINE IN DEXTROSE 0.75-8.25 % IT SOLN
INTRATHECAL | Status: DC | PRN
  Administered 2023-11-20: 1.8 mL via INTRATHECAL

## 2023-11-20 MED ORDER — METHOCARBAMOL 500 MG PO TABS
500.0000 mg | ORAL_TABLET | Freq: Four times a day (QID) | ORAL | Status: DC | PRN
  Filled 2023-11-20: qty 1

## 2023-11-20 MED ORDER — ONDANSETRON HCL 4 MG PO TABS
4.0000 mg | ORAL_TABLET | Freq: Four times a day (QID) | ORAL | Status: DC | PRN
  Administered 2023-11-21: 4 mg via ORAL
  Filled 2023-11-20: qty 1

## 2023-11-20 MED ORDER — APIXABAN 2.5 MG PO TABS
2.5000 mg | ORAL_TABLET | Freq: Two times a day (BID) | ORAL | Status: DC
  Administered 2023-11-21: 2.5 mg via ORAL
  Filled 2023-11-20: qty 1

## 2023-11-20 MED ORDER — ONDANSETRON HCL 4 MG/2ML IJ SOLN: 4.0000 mg | Freq: Once | INTRAMUSCULAR | Status: DC | PRN

## 2023-11-20 MED ORDER — DOXYCYCLINE HYCLATE 100 MG PO TABS
100.0000 mg | ORAL_TABLET | Freq: Two times a day (BID) | ORAL | Status: DC
  Administered 2023-11-20 – 2023-11-21 (×3): 100 mg via ORAL
  Filled 2023-11-20 (×3): qty 1

## 2023-11-20 MED ORDER — OXYCODONE HCL 5 MG PO TABS
10.0000 mg | ORAL_TABLET | ORAL | Status: DC | PRN
  Administered 2023-11-20 – 2023-11-21 (×2): 10 mg via ORAL
  Filled 2023-11-20: qty 2

## 2023-11-20 MED ORDER — DAPAGLIFLOZIN PROPANEDIOL 5 MG PO TABS
5.0000 mg | ORAL_TABLET | Freq: Every day | ORAL | Status: DC
  Administered 2023-11-21: 5 mg via ORAL
  Filled 2023-11-20: qty 1

## 2023-11-20 MED ORDER — LIDOCAINE 2% (20 MG/ML) 5 ML SYRINGE
INTRAMUSCULAR | Status: DC | PRN
  Administered 2023-11-20: 40 mg via INTRAVENOUS

## 2023-11-20 MED ORDER — ONDANSETRON HCL 4 MG/2ML IJ SOLN
INTRAMUSCULAR | Status: DC | PRN
  Administered 2023-11-20: 4 mg via INTRAVENOUS

## 2023-11-20 MED ORDER — VANCOMYCIN HCL 1000 MG IV SOLR
INTRAVENOUS | Status: AC
  Filled 2023-11-20: qty 20

## 2023-11-20 MED ORDER — DEXAMETHASONE SODIUM PHOSPHATE 10 MG/ML IJ SOLN
10.0000 mg | Freq: Once | INTRAMUSCULAR | Status: AC
  Administered 2023-11-21: 10 mg via INTRAVENOUS
  Filled 2023-11-20: qty 1

## 2023-11-20 MED ORDER — OXYCODONE HCL ER 10 MG PO T12A
10.0000 mg | EXTENDED_RELEASE_TABLET | Freq: Two times a day (BID) | ORAL | Status: DC
  Administered 2023-11-20 (×2): 10 mg via ORAL
  Filled 2023-11-20 (×2): qty 1

## 2023-11-20 MED ORDER — LACTATED RINGERS IV SOLN: INTRAVENOUS | Status: DC

## 2023-11-20 MED ORDER — APIXABAN 2.5 MG PO TABS
ORAL_TABLET | ORAL | 0 refills | Status: DC
  Filled 2023-11-20: qty 60, 30d supply, fill #0

## 2023-11-20 MED ORDER — FENTANYL CITRATE (PF) 250 MCG/5ML IJ SOLN
INTRAMUSCULAR | Status: DC | PRN
  Administered 2023-11-20 (×2): 50 ug via INTRAVENOUS

## 2023-11-20 MED ORDER — ONDANSETRON HCL 4 MG/2ML IJ SOLN
INTRAMUSCULAR | Status: AC
  Filled 2023-11-20: qty 2

## 2023-11-20 MED ORDER — BUPROPION HCL ER (SR) 150 MG PO TB12
150.0000 mg | ORAL_TABLET | Freq: Two times a day (BID) | ORAL | Status: DC
  Administered 2023-11-20 – 2023-11-21 (×3): 150 mg via ORAL
  Filled 2023-11-20 (×3): qty 1

## 2023-11-20 MED ORDER — BUPIVACAINE-MELOXICAM ER 400-12 MG/14ML IJ SOLN
INTRAMUSCULAR | Status: AC
  Filled 2023-11-20: qty 1

## 2023-11-20 MED ORDER — RAMIPRIL 1.25 MG PO CAPS
2.5000 mg | ORAL_CAPSULE | Freq: Every day | ORAL | Status: DC
  Administered 2023-11-20 – 2023-11-21 (×2): 2.5 mg via ORAL
  Filled 2023-11-20 (×2): qty 2

## 2023-11-20 MED ORDER — FENTANYL CITRATE (PF) 100 MCG/2ML IJ SOLN: 25.0000 ug | INTRAMUSCULAR | Status: DC | PRN

## 2023-11-20 MED ORDER — CEFAZOLIN SODIUM-DEXTROSE 2-4 GM/100ML-% IV SOLN
2.0000 g | Freq: Four times a day (QID) | INTRAVENOUS | Status: AC
  Administered 2023-11-20 (×2): 2 g via INTRAVENOUS
  Filled 2023-11-20 (×2): qty 100

## 2023-11-20 MED ORDER — LIDOCAINE 2% (20 MG/ML) 5 ML SYRINGE
INTRAMUSCULAR | Status: AC
  Filled 2023-11-20: qty 5

## 2023-11-20 MED ORDER — DOCUSATE SODIUM 100 MG PO CAPS
100.0000 mg | ORAL_CAPSULE | Freq: Two times a day (BID) | ORAL | Status: DC
  Administered 2023-11-20 – 2023-11-21 (×2): 100 mg via ORAL
  Filled 2023-11-20 (×3): qty 1

## 2023-11-20 MED ORDER — TRANEXAMIC ACID-NACL 1000-0.7 MG/100ML-% IV SOLN
1000.0000 mg | INTRAVENOUS | Status: AC
  Administered 2023-11-20: 1000 mg via INTRAVENOUS
  Filled 2023-11-20: qty 100

## 2023-11-20 MED ORDER — CHLORHEXIDINE GLUCONATE 0.12 % MT SOLN
15.0000 mL | Freq: Once | OROMUCOSAL | Status: AC
  Administered 2023-11-20: 15 mL via OROMUCOSAL
  Filled 2023-11-20: qty 15

## 2023-11-20 MED ORDER — PRONTOSAN WOUND IRRIGATION OPTIME
TOPICAL | Status: DC | PRN
  Administered 2023-11-20: 450 mL

## 2023-11-20 SURGICAL SUPPLY — 71 items
ALCOHOL 70% 16 OZ (MISCELLANEOUS) ×1 IMPLANT
BAG COUNTER SPONGE SURGICOUNT (BAG) IMPLANT
BAG DECANTER FOR FLEXI CONT (MISCELLANEOUS) ×1 IMPLANT
BANDAGE ESMARK 6X9 LF (GAUZE/BANDAGES/DRESSINGS) IMPLANT
BLADE SAG 18X100X1.27 (BLADE) ×1 IMPLANT
BLADE SAW SGTL 73X25 THK (BLADE) ×1 IMPLANT
BOWL SMART MIX CTS (DISPOSABLE) ×1 IMPLANT
CEMENT BONE R 1X40 (Cement) IMPLANT
CLSR STERI-STRIP ANTIMIC 1/2X4 (GAUZE/BANDAGES/DRESSINGS) ×2 IMPLANT
COMPONENT TIB CMT PS D 0D RT (Joint) IMPLANT
COOLER ICEMAN CLASSIC (MISCELLANEOUS) ×1 IMPLANT
COVER SURGICAL LIGHT HANDLE (MISCELLANEOUS) ×1 IMPLANT
CUFF TOURN SGL QUICK 42 (TOURNIQUET CUFF) IMPLANT
CUFF TRNQT CYL 34X4.125X (TOURNIQUET CUFF) ×1 IMPLANT
DERMABOND ADVANCED .7 DNX12 (GAUZE/BANDAGES/DRESSINGS) ×1 IMPLANT
DRAPE EXTREMITY T 121X128X90 (DISPOSABLE) ×1 IMPLANT
DRAPE HALF SHEET 40X57 (DRAPES) ×1 IMPLANT
DRAPE INCISE IOBAN 66X45 STRL (DRAPES) ×1 IMPLANT
DRAPE POUCH INSTRU U-SHP 10X18 (DRAPES) ×1 IMPLANT
DRAPE SURG ORHT 6 SPLT 77X108 (DRAPES) IMPLANT
DRAPE U-SHAPE 47X51 STRL (DRAPES) ×2 IMPLANT
DRSG AQUACEL AG ADV 3.5X10 (GAUZE/BANDAGES/DRESSINGS) ×1 IMPLANT
DURAPREP 26ML APPLICATOR (WOUND CARE) ×3 IMPLANT
ELECT CAUTERY BLADE 6.4 (BLADE) ×1 IMPLANT
ELECT PENCIL ROCKER SW 15FT (MISCELLANEOUS) ×1 IMPLANT
ELECTRODE REM PT RTRN 9FT ADLT (ELECTROSURGICAL) ×1 IMPLANT
FEMUR CMTD CR STD SZ 8 RT KNEE (Joint) IMPLANT
GLOVE BIOGEL PI IND STRL 7.0 (GLOVE) ×2 IMPLANT
GLOVE BIOGEL PI IND STRL 7.5 (GLOVE) ×5 IMPLANT
GLOVE ECLIPSE 7.0 STRL STRAW (GLOVE) ×3 IMPLANT
GLOVE INDICATOR 7.0 STRL GRN (GLOVE) ×1 IMPLANT
GLOVE INDICATOR 7.5 STRL GRN (GLOVE) ×1 IMPLANT
GLOVE SURG SYN 7.5 E (GLOVE) ×2 IMPLANT
GLOVE SURG SYN 7.5 PF PI (GLOVE) ×2 IMPLANT
GLOVE SURG UNDER LTX SZ7.5 (GLOVE) ×2 IMPLANT
GLOVE SURG UNDER POLY LF SZ7 (GLOVE) ×2 IMPLANT
GOWN STRL REUS W/ TWL LRG LVL3 (GOWN DISPOSABLE) ×1 IMPLANT
GOWN STRL SURGICAL XL XLNG (GOWN DISPOSABLE) ×1 IMPLANT
GOWN TOGA ZIPPER T7+ PEEL AWAY (MISCELLANEOUS) ×2 IMPLANT
HOOD PEEL AWAY T7 (MISCELLANEOUS) ×1 IMPLANT
INSERT TIB PERS SZ 8-9 10 RT (Insert) IMPLANT
KIT BASIN OR (CUSTOM PROCEDURE TRAY) ×1 IMPLANT
KIT TURNOVER KIT B (KITS) ×1 IMPLANT
MANIFOLD NEPTUNE II (INSTRUMENTS) ×1 IMPLANT
MARKER SKIN DUAL TIP RULER LAB (MISCELLANEOUS) ×2 IMPLANT
NDL SPNL 18GX3.5 QUINCKE PK (NEEDLE) ×1 IMPLANT
NEEDLE SPNL 18GX3.5 QUINCKE PK (NEEDLE) ×1 IMPLANT
NS IRRIG 1000ML POUR BTL (IV SOLUTION) ×1 IMPLANT
PACK TOTAL JOINT (CUSTOM PROCEDURE TRAY) ×1 IMPLANT
PAD ARMBOARD POSITIONER FOAM (MISCELLANEOUS) ×2 IMPLANT
PAD COLD SHLDR WRAP-ON (PAD) ×1 IMPLANT
PIN DRILL HDLS TROCAR 75 4PK (PIN) IMPLANT
SCREW FEMALE HEX FIX 25X2.5 (ORTHOPEDIC DISPOSABLE SUPPLIES) IMPLANT
SET HNDPC FAN SPRY TIP SCT (DISPOSABLE) ×1 IMPLANT
SOLUTION PRONTOSAN WOUND 350ML (IRRIGATION / IRRIGATOR) ×1 IMPLANT
STAPLER SKIN PROX 35W (STAPLE) IMPLANT
STEM POLY PAT PLY 29M KNEE (Knees) IMPLANT
SUCTION TUBE FRAZIER 10FR DISP (SUCTIONS) ×1 IMPLANT
SUT ETHILON 2 0 FS 18 (SUTURE) IMPLANT
SUT MNCRL AB 3-0 PS2 27 (SUTURE) IMPLANT
SUT STRATAFIX PDS+ 0 24IN (SUTURE) IMPLANT
SUT VIC AB 0 CT1 27XBRD ANBCTR (SUTURE) ×2 IMPLANT
SUT VIC AB 1 CTX 27 (SUTURE) ×3 IMPLANT
SUT VIC AB 2-0 CT1 TAPERPNT 27 (SUTURE) ×4 IMPLANT
SYR 50ML LL SCALE MARK (SYRINGE) ×2 IMPLANT
TOWEL GREEN STERILE (TOWEL DISPOSABLE) ×1 IMPLANT
TOWEL GREEN STERILE FF (TOWEL DISPOSABLE) ×1 IMPLANT
TRAY CATH INTERMITTENT SS 16FR (CATHETERS) IMPLANT
TUBE SUCT ARGYLE STRL (TUBING) ×1 IMPLANT
UNDERPAD 30X36 HEAVY ABSORB (UNDERPADS AND DIAPERS) ×1 IMPLANT
YANKAUER SUCT BULB TIP NO VENT (SUCTIONS) ×2 IMPLANT

## 2023-11-20 NOTE — H&P (Signed)
 PREOPERATIVE H&P  Chief Complaint: right knee osteoarthritis  HPI: Ann Foster is a 58 y.o. female who presents for surgical treatment of right knee osteoarthritis.  She denies any changes in medical history.  Past Surgical History:  Procedure Laterality Date  . CHOLECYSTECTOMY N/A 04/15/2023   Procedure: LAPAROSCOPIC CHOLECYSTECTOMY WITH ICG DYE;  Surgeon: Ebbie Cough, MD;  Location: [facility];  Service: General;  Laterality: N/A;  . COLONOSCOPY     Social History   Socioeconomic History  . Marital status: Single    Spouse name: Not on file  . Number of children: 3  . Years of education: Not on file  . Highest education level: 12th grade  Occupational History  . Not on file  Tobacco Use  . Smoking status: Former    Current packs/day: 0.00    Types: Cigarettes    Quit date: 11/28/2019    Years since quitting: 3.9  . Smokeless tobacco: Never  Vaping Use  . Vaping status: Never Used  Substance and Sexual Activity  . Alcohol use: Not Currently  . Drug use: No  . Sexual activity: Not Currently    Birth control/protection: Condom  Other Topics Concern  . Not on file  Social History Narrative   Left Handed   One story home    Social Drivers of Health   Financial Resource Strain: Low Risk  (09/14/2023)   Overall Financial Resource Strain (CARDIA)   . Difficulty of Paying Living Expenses: Not hard at all  Food Insecurity: Food Insecurity Present (09/14/2023)   Hunger Vital Sign   . Worried About Programme researcher, broadcasting/film/video in the Last Year: Sometimes true   . Ran Out of Food in the Last Year: Never true  Transportation Needs: No Transportation Needs (09/14/2023)   PRAPARE - Transportation   . Lack of Transportation (Medical): No   . Lack of Transportation (Non-Medical): No  Physical Activity: Sufficiently Active (09/14/2023)   Exercise Vital Sign   . Days of Exercise per Week: 4 days   . Minutes of Exercise per Session: 60 min  Stress: No Stress Concern Present  (09/14/2023)   [employer] of Occupational Health - Occupational Stress Questionnaire   . Feeling of Stress : Not at all  Social Connections: Moderately Isolated (09/14/2023)   Social Connection and Isolation Panel   . Frequency of Communication with Friends and Family: More than three times a week   . Frequency of Social Gatherings with Friends and Family: More than three times a week   . Attends Religious Services: 1 to 4 times per year   . Active Member of Clubs or Organizations: No   . Attends Banker Meetings: Never   . Marital Status: Never married   Family History  Problem Relation Age of Onset  . Breast cancer Mother   . Colon cancer Father 28  . Diabetes Son   . Esophageal cancer Neg Hx   . Stomach cancer Neg Hx   . Liver disease Neg Hx   . Pancreatic cancer Neg Hx   . Rectal cancer Neg Hx    No Known Allergies Prior to Admission medications   Medication Sig Start Date End Date Taking? Authorizing Provider  albuterol  (VENTOLIN  HFA) 108 (90 Base) MCG/ACT inhaler Inhale 2 puffs into the lungs every 6 (six) hours as needed for wheezing or shortness of breath. 09/18/23  Yes Theotis Haze ORN, NP  atorvastatin  (LIPITOR) 80 MG tablet Take 1 tablet (80 mg total) by  mouth daily. 02/16/23  Yes McClung, Angela M, PA-C  buPROPion  (WELLBUTRIN  SR) 150 MG 12 hr tablet TAKE 1 TABLET(150 MG) BY MOUTH TWICE DAILY 06/12/23  Yes Ajewole, Christana, MD  busPIRone  (BUSPAR ) 10 MG tablet TAKE 1 TABLET(10 MG) BY MOUTH TWICE DAILY 08/14/23  Yes Ajewole, Christana, MD  cyclobenzaprine  (FLEXERIL ) 5 MG tablet Take 1 tablet (5 mg total) by mouth at bedtime as needed for muscle spasms. 03/02/22  Yes Fleming, Zelda W, NP  dapagliflozin  propanediol (FARXIGA ) 5 MG TABS tablet TAKE 1 TABLET BY MOUTH ONCE  DAILY BEFORE BREAKFAST 03/27/23  Yes Newlin, Enobong, MD  docusate sodium (COLACE) 100 MG capsule Take 1 capsule (100 mg total) by mouth daily as needed. 11/14/23 11/13/24  Jule Ronal CROME, PA-C   doxycycline (VIBRA-TABS) 100 MG tablet Take 1 tablet (100 mg total) by mouth 2 (two) times daily. To be taken after surgery 11/14/23   Jule Ronal CROME, PA-C  Dulaglutide  (TRULICITY ) 3 MG/0.5ML SOAJ Inject 3 mg as directed once a week. 09/18/23  Yes Fleming, Zelda W, NP  estradiol  (ESTRACE ) 0.1 MG/GM vaginal cream Apply 1 gram per vagina every night for 2 weeks, then apply three times a week 02/17/23  Yes Ajewole, Christana, MD  fluticasone  (FLONASE ) 50 MCG/ACT nasal spray Place 2 sprays into both nostrils daily as needed for allergies. 08/09/23  Yes [provider]  methocarbamol (ROBAXIN) 750 MG tablet Take 1 tablet (750 mg total) by mouth 3 (three) times daily as needed. 11/14/23   Jule Ronal CROME, PA-C  montelukast  (SINGULAIR ) 10 MG tablet Take 1 tablet (10 mg total) by mouth at bedtime. 02/16/23  Yes Danton Jon HERO, PA-C  omeprazole  (PRILOSEC) 40 MG capsule Take 1 capsule (40 mg total) by mouth daily. 11/29/21  Yes Fleming, Zelda W, NP  ondansetron  (ZOFRAN ) 4 MG tablet Take 1 tablet (4 mg total) by mouth every 8 (eight) hours as needed for nausea or vomiting. 11/14/23   Jule Ronal CROME, PA-C  oxyCODONE -acetaminophen  (PERCOCET) 5-325 MG tablet Take 1-2 tablets by mouth every 6 (six) hours as needed. To be taken after surgery 11/14/23   Jule Ronal CROME, PA-C  ramipril  (ALTACE ) 2.5 MG capsule TAKE 1 CAPSULE(2.5 MG) BY MOUTH DAILY 08/29/23  Yes Newlin, Enobong, MD  traMADol  (ULTRAM ) 50 MG tablet Take 1-2 tablets (50-100 mg total) by mouth daily as needed. 09/26/23  Yes Jerri Kay HERO, MD  Accu-Chek Softclix Lancets lancets Check blood glucose level by fingerstick once per day. E11.65 09/27/23   Fleming, Zelda W, NP  Blood Glucose Monitoring Suppl (ACCU-CHEK GUIDE) w/Device KIT Check blood glucose level by fingerstick once per day. E11.65 05/07/21   Newlin, Enobong, MD  fluconazole  (DIFLUCAN ) 150 MG tablet Take 1 tablet (150 mg total) by mouth every 3 (three) days. Patient not taking: Reported on  11/07/2023 09/18/23   Theotis Haze ORN, NP  glucose blood (ACCU-CHEK GUIDE) test strip Check blood glucose level by fingerstick once per day. E11.65 02/16/23   Danton Jon HERO, PA-C  Insulin  Pen Needle (B-D UF III MINI PEN NEEDLES) 31G X 5 MM MISC Use as instructed. Inject into the skin once weekly 09/18/23   Fleming, Zelda W, NP  Prasterone  (INTRAROSA ) 6.5 MG INST Insert one capsule vaginally 10/24/19   Cris Burnard DEL, MD  lisinopril  (ZESTRIL ) 2.5 MG tablet Take 1 tablet (2.5 mg total) by mouth 2 (two) times daily. 09/12/19 04/22/20  Delbert Clam, MD     Positive ROS: All other systems have been reviewed and were otherwise negative  with the exception of those mentioned in the HPI and as above.  Physical Exam: General: Alert, no acute distress Cardiovascular: No pedal edema Respiratory: No cyanosis, no use of accessory musculature GI: abdomen soft Skin: No lesions in the area of chief complaint Neurologic: Sensation intact distally Psychiatric: Patient is competent for consent with normal mood and affect Lymphatic: no lymphedema  MUSCULOSKELETAL: exam stable  Assessment: right knee osteoarthritis  Plan: Plan for Procedure(s): ARTHROPLASTY, KNEE, TOTAL  The risks benefits and alternatives were discussed with the patient including but not limited to the risks of nonoperative treatment, versus surgical intervention including infection, bleeding, nerve injury,  blood clots, cardiopulmonary complications, morbidity, mortality, among others, and they were willing to proceed.   Ozell Cummins, MD 11/20/2023 5:51 AM

## 2023-11-20 NOTE — Progress Notes (Signed)
 Orthopedic Tech Progress Note Patient Details:  Ann Foster 02-03-1966 [id number]  Ortho Devices Type of Ortho Device: Bone foam zero knee Ortho Device/Splint Location: RLE Ortho Device/Splint Interventions: Ordered, Application   Post Interventions Patient Tolerated: Well Instructions Provided: Care of device  Delanna LITTIE Pac 11/20/2023, 12:48 PM

## 2023-11-20 NOTE — Evaluation (Signed)
 Physical Therapy Evaluation Patient Details Name: Ann Foster MRN: [id number] DOB: [birth year] Today's Date: 11/20/2023  History of Present Illness  Pt is a 58yo female who underwent R TKA on 6/23. PMH: s/p gall bladder removal  Clinical Impression  Pt is s/p TKA resulting in the deficits listed below (see PT Problem List). Pt mobilizing well for first time up despite report of residual numbness in R LE. Pt motivated and anticipate will progress well. Pt to benefit from 3n1 commode and youth RW for safe mobilization at home in addition to HHPT to address R knee ROM and progression of mobility towards independence. Pt will benefit from acute skilled PT to increase their independence and safety with mobility to allow discharge.          If plan is discharge home, recommend the following: A little help with walking and/or transfers;A little help with bathing/dressing/bathroom;Assist for transportation;Help with stairs or ramp for entrance   Can travel by private vehicle        Equipment Recommendations Rolling walker (2 wheels);BSC/3in1 (youth R)  Recommendations for Other Services       Functional Status Assessment Patient has had a recent decline in their functional status and demonstrates the ability to make significant improvements in function in a reasonable and predictable amount of time.     Precautions / Restrictions Precautions Precautions: Knee Precaution Booklet Issued: No Precaution/Restrictions Comments: educated on R LE bone foam and proper placement Restrictions Weight Bearing Restrictions Per Provider Order: No      Mobility  Bed Mobility Overal bed mobility: Needs Assistance Bed Mobility: Supine to Sit     Supine to sit: Min assist     General bed mobility comments: minA for trunk elevation to mimic home set up, verbal cues to perform R quad set prior to sliding LE to EOB, min A for LE Management, pt able to scoot hips forward to have feet meet floor     Transfers Overall transfer level: Needs assistance Equipment used: Rolling walker (2 wheels) Transfers: Sit to/from Stand Sit to Stand: Min assist           General transfer comment: verbal cues for hand placement, increased time, minA to steady during transition of hands, pt kicked out R LE to return to sit    Ambulation/Gait Ambulation/Gait assistance: Contact guard assist Gait Distance (Feet): 12 Feet (x2, to/from bathroom) Assistive device: Rolling walker (2 wheels) Gait Pattern/deviations: Step-to pattern Gait velocity: dec Gait velocity interpretation: <1.8 ft/sec, indicate of risk for recurrent falls   General Gait Details: step to pattern, pt reporting numbness still through R LE, tactile cues to complete R quad contraction during stance phase, initially minA when walking to the bathroom due to R knee instability however pt transitioned to CGA upon returning from bathroom  Stairs            Wheelchair Mobility     Tilt Bed    Modified Rankin (Stroke Patients Only)       Balance Overall balance assessment: Needs assistance Sitting-balance support: Feet supported, No upper extremity supported Sitting balance-Leahy Scale: Fair     Standing balance support: Bilateral upper extremity supported, During functional activity, Reliant on assistive device for balance Standing balance-Leahy Scale: Poor Standing balance comment: reliant on RW at this time                             Pertinent Vitals/Pain Pain Assessment Pain Assessment:  Faces Faces Pain Scale: Hurts a little bit Pain Location: R knee, pt reports just receiving pain medicine and still feels numb Pain Descriptors / Indicators: Aching Pain Intervention(s): Premedicated before session    Home Living Family/patient expects to be discharged to:: Private residence Living Arrangements: Children Available Help at Discharge: Family;Available 24 hours/day Type of Home: House Home  Access: Stairs to enter Entrance Stairs-Rails: None Entrance Stairs-Number of Steps: 2   Home Layout: One level Home Equipment: None      Prior Function Prior Level of Function : Independent/Modified Independent             Mobility Comments: no AD, doesn't work ADLs Comments: indep     Extremity/Trunk Assessment   Upper Extremity Assessment Upper Extremity Assessment: Overall WFL for tasks assessed    Lower Extremity Assessment Lower Extremity Assessment: RLE deficits/detail RLE Deficits / Details: able to initiate quad set, decreased active knee flex/extension as expected from surgery RLE Sensation: decreased light touch (reports numbness)    Cervical / Trunk Assessment Cervical / Trunk Assessment: Normal  Communication   Communication Communication: No apparent difficulties    Cognition Arousal: Alert Behavior During Therapy: WFL for tasks assessed/performed   PT - Cognitive impairments: No apparent impairments                         Following commands: Intact       Cueing Cueing Techniques: Verbal cues     General Comments General comments (skin integrity, edema, etc.): minimal drainage through dressing on R knee, ice applied to R knee. pt assisted to the bathroom, indep with hygiene    Exercises Total Joint Exercises Ankle Circles/Pumps: AROM, Both, 10 reps, Supine Quad Sets: AROM, Right, 10 reps, Supine (with 5 sec hold) Gluteal Sets: AROM, Both, 10 reps, Supine (with 5 sec hold) Long Arc Quad: AROM, Right, 5 reps, Seated (limited to less than half of ROM)   Assessment/Plan    PT Assessment Patient needs continued PT services  PT Problem List Decreased strength;Decreased range of motion;Decreased activity tolerance;Decreased balance;Decreased mobility;Decreased coordination       PT Treatment Interventions DME instruction;Gait training;Stair training;Functional mobility training;Therapeutic activities;Therapeutic exercise    PT  Goals (Current goals can be found in the Care Plan section)  Acute Rehab PT Goals Patient Stated Goal: home PT Goal Formulation: With patient Time For Goal Achievement: 12/04/23 Potential to Achieve Goals: Good    Frequency 7X/week     Co-evaluation               AM-PAC PT 6 Clicks Mobility  Outcome Measure Help needed turning from your back to your side while in a flat bed without using bedrails?: A Little Help needed moving from lying on your back to sitting on the side of a flat bed without using bedrails?: A Little Help needed moving to and from a bed to a chair (including a wheelchair)?: A Little Help needed standing up from a chair using your arms (e.g., wheelchair or bedside chair)?: A Little Help needed to walk in hospital room?: A Little Help needed climbing 3-5 steps with a railing? : A Little 6 Click Score: 18    End of Session Equipment Utilized During Treatment: Gait belt Activity Tolerance: Patient tolerated treatment well Patient left: in chair;with call bell/phone within reach Nurse Communication: Mobility status PT Visit Diagnosis: Unsteadiness on feet (R26.81);Muscle weakness (generalized) (M62.81)    Time: 8558-8486 PT Time Calculation (min) (ACUTE ONLY):  32 min   Charges:   PT Evaluation $PT Eval Moderate Complexity: 1 Mod PT Treatments $Gait Training: 8-22 mins PT General Charges $$ ACUTE PT VISIT: 1 Visit         Norene Ames, PT, DPT Acute Rehabilitation Services Secure chat preferred Office #: [phone]   Norene CHRISTELLA Ames 11/20/2023, 3:29 PM

## 2023-11-20 NOTE — Anesthesia Preprocedure Evaluation (Signed)
 Anesthesia Evaluation  Patient identified by MRN, date of birth, ID band Patient awake    Reviewed: Allergy & Precautions, NPO status , Patient's Chart, lab work & pertinent test results, reviewed documented beta blocker date and time   History of Anesthesia Complications Negative for: history of anesthetic complications  Airway Mallampati: II  TM Distance: >3 FB     Dental  (+) Edentulous Upper, Edentulous Lower   Pulmonary sleep apnea and Continuous Positive Airway Pressure Ventilation , neg COPD, former smoker   breath sounds clear to auscultation       Cardiovascular hypertension, (-) angina (-) CAD, (-) Past MI, (-) Cardiac Stents and (-) CABG  Rhythm:Regular Rate:Normal     Neuro/Psych neg Seizures    GI/Hepatic   Endo/Other  diabetes, Type 2, Insulin  Dependent    Renal/GU      Musculoskeletal  (+) Arthritis , Osteoarthritis,    Abdominal   Peds  Hematology   Anesthesia Other Findings   Reproductive/Obstetrics                              Anesthesia Physical Anesthesia Plan  ASA: 2  Anesthesia Plan: Spinal   Post-op Pain Management: Regional block*   Induction: Intravenous  PONV Risk Score and Plan: 1 and Ondansetron  and Propofol  infusion  Airway Management Planned: Natural Airway and Simple Face Mask  Additional Equipment:   Intra-op Plan:   Post-operative Plan: Extubation in OR  Informed Consent: I have reviewed the patients History and Physical, chart, labs and discussed the procedure including the risks, benefits and alternatives for the proposed anesthesia with the patient or authorized representative who has indicated his/her understanding and acceptance.     Dental advisory given  Plan Discussed with: CRNA  Anesthesia Plan Comments:          Anesthesia Quick Evaluation

## 2023-11-20 NOTE — Transfer of Care (Signed)
 Immediate Anesthesia Transfer of Care Note  Patient: Ann Foster  Procedure(s) Performed: ARTHROPLASTY, KNEE, TOTAL RIGHT (Right: Knee)  Patient Location: PACU  Anesthesia Type:MAC  Level of Consciousness: awake, alert , and oriented  Airway & Oxygen Therapy: Patient Spontanous Breathing and Patient connected to face mask oxygen  Post-op Assessment: Report given to RN and Post -op Vital signs reviewed and stable  Post vital signs: Reviewed and stable  Last Vitals:  Vitals Value Taken Time  BP 137/88 11/20/23 11:10  Temp    Pulse 65 11/20/23 11:10  Resp 17 11/20/23 11:10  SpO2 100 % 11/20/23 11:10  Vitals shown include unfiled device data.  Last Pain:  Vitals:   11/20/23 0650  TempSrc:   PainSc: 0-No pain         Complications: No notable events documented.

## 2023-11-20 NOTE — Anesthesia Procedure Notes (Addendum)
 Anesthesia Regional Block: Adductor canal block   Pre-Anesthetic Checklist: , timeout performed,  Correct Patient, Correct Site, Correct Laterality,  Correct Procedure, Correct Position, site marked,  Risks and benefits discussed,  Surgical consent,  Pre-op evaluation,  At surgeon's request and post-op pain management  Laterality: Right  Prep: Maximum Sterile Barrier Precautions used, chloraprep       Needles:  Injection technique: Single-shot  Needle Type: Echogenic Needle      Needle Gauge: 20     Additional Needles:   Procedures:,,,, ultrasound used (permanent image in chart),,    Narrative:  Start time: 11/20/2023 8:00 AM End time: 11/20/2023 8:05 AM Injection made incrementally with aspirations every 5 mL.  Performed by: Personally  Anesthesiologist: Keneth Lynwood POUR, MD

## 2023-11-20 NOTE — Anesthesia Procedure Notes (Signed)
 Spinal  Patient location during procedure: OR Start time: 11/20/2023 8:51 AM End time: 11/20/2023 8:55 AM Reason for block: surgical anesthesia Staffing Performed: anesthesiologist  Anesthesiologist: Keneth Lynwood POUR, MD Performed by: Keneth Lynwood POUR, MD Authorized by: Keneth Lynwood POUR, MD   Preanesthetic Checklist Completed: patient identified, IV checked, site marked, risks and benefits discussed, surgical consent, monitors and equipment checked, pre-op evaluation and timeout performed Spinal Block Patient position: sitting Prep: DuraPrep Patient monitoring: heart rate, cardiac monitor, continuous pulse ox and blood pressure Approach: midline Location: L3-4 Injection technique: single-shot Needle Needle type: Sprotte  Needle gauge: 24 G Needle length: 9 cm Assessment Sensory level: T4 Events: CSF return

## 2023-11-20 NOTE — Discharge Instructions (Addendum)

## 2023-11-20 NOTE — Op Note (Signed)
 Total Knee Arthroplasty Procedure Note  Preoperative diagnosis: Right knee osteoarthritis  Postoperative diagnosis:same  Operative findings: Complete loss articular cartilage from femoral, tibial, patellar surfaces Flexion contracture  Operative procedure: Right total knee arthroplasty. CPT [phone]  Surgeon: N. Ozell Cummins, MD  Assist: Ann Morna Grave, PA-C; necessary for the timely completion of procedure and due to complexity of procedure.  Anesthesia: Spinal, regional, local  Tourniquet time: see anesthesia record  Implants used: Zimmer persona Femur: CR 8 Tibia: D keel Patella: 29 mm Polyethylene: 10 mm medial congruent  Indication: Ann Foster is a 58 y.o. year old female with a history of knee pain. Having failed conservative management, the patient elected to proceed with a total knee arthroplasty.  We have reviewed the risk and benefits of the surgery and they elected to proceed after voicing understanding.  Procedure:  After informed consent was obtained and understanding of the risk were voiced including but not limited to bleeding, infection, damage to surrounding structures including nerves and vessels, blood clots, leg length inequality and the failure to achieve desired results, the operative extremity was marked with verbal confirmation of the patient in the holding area.   The patient was then brought to the operating room and transported to the operating room table in the supine position.  A tourniquet was applied to the operative extremity around the upper thigh. The operative limb was then prepped and draped in the usual sterile fashion and preoperative antibiotics were administered.  A time out was performed prior to the start of surgery confirming the correct extremity, preoperative antibiotic administration, as well as team members, implants and instruments available for the case. Correct surgical site was also confirmed with preoperative  radiographs. The limb was then elevated for exsanguination and the tourniquet was inflated. A midline incision was made and a standard medial parapatellar approach was performed.  The infrapatellar fat pad was removed.  Suprapatellar synovium was removed to reveal the anterior distal femoral cortex.  A medial peel was performed to release the capsule and the deep MCL off of the medial tibial plateau back to the semimembranosus.  The patella was then everted which showed complete loss of articular cartilage and was resected down to 14 mm and sized to a 29 mm.  A cover was placed on the patella for protection from retractors.  The knee was then brought into flexion and we then turned our attention to the femur.  The ACL was sacrificed.  Start site was drilled in the femur and the intramedullary distal femoral cutting guide was placed, set at 5 degrees valgus, taking 13 mm of distal resection due to flexion contracture. The distal cut was made. Osteophytes were then removed.  Next, the proximal tibial cutting guide was placed with appropriate slope, varus/valgus alignment and depth of resection.  The drop rod was attached to confirm that it was aimed at the second metatarsal.  The proximal tibial cut was made taking 2 mm off the low side. Gap blocks were then used to assess the extension gap and alignment, and appropriate soft tissue releases were performed. Attention was turned back to the femur, which was sized using the sizing guide to a size 8. Appropriate rotation of the femoral component was determined using epicondylar axis, Whiteside's line, and assessing the flexion gap under ligament tension. The appropriate size 4-in-1 cutting block was placed and checked with an angel wing and cuts were made. Posterior femoral osteophytes and uncapped bone were then removed with the curved  osteotome.  The menisci were removed.  Trial components were placed, and stability was checked in full extension, mid-flexion, and  deep flexion.  PCL was resected to balance the flexion space. Proper tibial rotation was determined and marked.  The patella tracked well without a lateral release.  The femoral lugs were then drilled. Trial components were then removed and tibial preparation performed.  The trial tibia was pointed to the medial third of the tibial tubercle.  The tibia was sized for a size D component and prepared.  Trial components were removed.    The bony surfaces were irrigated with a pulse lavage and then dried. Bone cement was vacuum mixed on the back table, and the final components sized above were cemented into place.  Antibiotic irrigation was placed in the knee joint and soft tissues while the cement cured.  After cement had finished curing, excess cement was removed. The stability of the construct was re-evaluated throughout a range of motion and found to be acceptable. The trial liner was removed, the knee was copiously irrigated, and the knee was re-evaluated for any excess bone debris. The real polyethylene liner, 10 mm thick, was inserted and checked to ensure the locking mechanism had engaged appropriately. The tourniquet was deflated and hemostasis was achieved. The wound was irrigated with normal saline.  One gram of vancomycin powder was placed in the surgical bed.  Topical mixture of 0.25% bupivacaine  and meloxicam  was placed in the joint for postoperative pain.  Capsular closure was performed with a #1 stratafix in flexion, subcutaneous fat closed with a 0 vicryl suture, then subcutaneous tissue closed with interrupted 2.0 vicryl suture. The skin was then closed with a 2.0 nylon and dermabond. A sterile dressing was applied.  The patient was awakened in the operating room and taken to recovery in stable condition. All sponge, needle, and instrument counts were correct at the end of the case.  [name] was necessary for opening, closing, retracting, limb positioning and overall facilitation and  completion of the surgery.  Position: supine  Complications: none.  Time Out: performed   Drains/Packing: none Estimated blood loss: minimal Returned to Recovery Room: in good condition.   Mechanical VTE (DVT) Prophylaxis: sequential compression devices, TED thigh-high  Chemical VTE (DVT) Prophylaxis: eliquis  Fluid Replacement  Crystalloid: see anesthesia record Blood: none  FFP: none   Specimens Removed: 1 to pathology  Sponge and Instrument Count Correct? yes  PACU: portable radiograph - knee AP and Lateral  Plan/RTC: Return in 2 weeks for suture removal.  Weight Bearing/Load Lower Extremity: full   Implant Name Type Inv. Item Serial No. Manufacturer Lot No. LRB No. Used Action  CEMENT BONE R 1X40 - [id number] Cement CEMENT BONE R 1X40  [employer](ORTH,TRAU,BIO,SG) [id number] Right 2 Implanted  INSERT TIB PERS SZ 8-9 10 RT - [id number] Insert INSERT TIB PERS SZ 8-9 10 RT [id number] [employer](ORTH,TRAU,BIO,SG) [id number] Right 1 Implanted  STEM POLY PAT PLY 46M KNEE - [id number] Knees STEM POLY PAT PLY 46M KNEE [id number] [employer](ORTH,TRAU,BIO,SG) [id number] Right 1 Implanted  COMPONENT TIB CMT PS D 0D RT - [id number] Joint COMPONENT TIB CMT PS D 0D RT [id number] [employer](ORTH,TRAU,BIO,SG) [id number] Right 1 Implanted  FEMUR CMTD CR STD SZ 8 RT KNEE - [id number] Joint FEMUR CMTD CR STD SZ 8 RT KNEE [id number] [employer](ORTH,TRAU,BIO,SG) [id number] Right 1 Implanted    N. Ozell Cummins, MD [facility] 10:26 AM

## 2023-11-20 NOTE — Anesthesia Postprocedure Evaluation (Signed)
 Anesthesia Post Note  Patient: Ann Foster  Procedure(s) Performed: ARTHROPLASTY, KNEE, TOTAL RIGHT (Right: Knee)     Patient location during evaluation: PACU Anesthesia Type: Spinal Level of consciousness: awake and alert Pain management: pain level controlled Vital Signs Assessment: post-procedure vital signs reviewed and stable Respiratory status: spontaneous breathing, nonlabored ventilation, respiratory function stable and patient connected to nasal cannula oxygen Cardiovascular status: blood pressure returned to baseline and stable Postop Assessment: no apparent nausea or vomiting Anesthetic complications: no   No notable events documented.  Last Vitals:  Vitals:   11/20/23 1215 11/20/23 1237  BP: (!) 138/96 (!) 144/81  Pulse: 60 62  Resp: 18 18  Temp: 36.6 C 36.5 C  SpO2: 98% 100%    Last Pain:  Vitals:   11/20/23 1250  TempSrc:   PainSc: 0-No pain                 Lynwood MARLA Cornea

## 2023-11-21 ENCOUNTER — Encounter (HOSPITAL_COMMUNITY): Payer: Self-pay | Admitting: Orthopaedic Surgery

## 2023-11-21 DIAGNOSIS — Z794 Long term (current) use of insulin: Secondary | ICD-10-CM | POA: Diagnosis not present

## 2023-11-21 DIAGNOSIS — M1711 Unilateral primary osteoarthritis, right knee: Secondary | ICD-10-CM | POA: Diagnosis not present

## 2023-11-21 DIAGNOSIS — E119 Type 2 diabetes mellitus without complications: Secondary | ICD-10-CM | POA: Diagnosis not present

## 2023-11-21 DIAGNOSIS — Z87891 Personal history of nicotine dependence: Secondary | ICD-10-CM | POA: Diagnosis not present

## 2023-11-21 DIAGNOSIS — Z7984 Long term (current) use of oral hypoglycemic drugs: Secondary | ICD-10-CM | POA: Diagnosis not present

## 2023-11-21 DIAGNOSIS — Z79899 Other long term (current) drug therapy: Secondary | ICD-10-CM | POA: Diagnosis not present

## 2023-11-21 DIAGNOSIS — I10 Essential (primary) hypertension: Secondary | ICD-10-CM | POA: Diagnosis not present

## 2023-11-21 DIAGNOSIS — Z96651 Presence of right artificial knee joint: Secondary | ICD-10-CM | POA: Diagnosis not present

## 2023-11-21 LAB — GLUCOSE, CAPILLARY: Glucose-Capillary: 138 mg/dL — ABNORMAL HIGH (ref 70–99)

## 2023-11-21 NOTE — Plan of Care (Signed)
 Pt doing well. Pt and daughter given D/C instructions with verbal understanding. Rx's were sent to the pharmacy by MD. Pt's incision is clean and dry with no sign of infection. Pt's IV was removed prior to D/C. Pt D/C'd home via wheelchair per MD order. Pt received RW and 3-n-1 from [employer] per MD order. Pt is stable @ D/C and has no other needs at this time. Rema Fendt, RN

## 2023-11-21 NOTE — Discharge Summary (Signed)
 Patient ID: Ann Foster MRN: [id number] DOB/AGE: 12-21-1965 58 y.o.  Admit date: 11/20/2023 Discharge date: 11/21/2023  Admission Diagnoses:  Principal Problem:   Primary osteoarthritis of right knee Active Problems:   Status post total right knee replacement   Discharge Diagnoses:  Same  Past Medical History:  Diagnosis Date   Ankle fracture 2016   Right   Aortic atherosclerosis (HCC)    trace calcific atherosclerosis aortic arch per ct neck done 12-22-17   Bronchitis    Diabetes mellitus without complication (HCC)    Hypertension    OA (osteoarthritis) of shoulder    left shoulder, both knees arthritis   Osteoarthritis, knee    Sleep apnea    wears CPAP    Surgeries: Procedure(s): ARTHROPLASTY, KNEE, TOTAL RIGHT on 11/20/2023   Consultants:   Discharged Condition: Improved  Hospital Course: Ann Foster is an 58 y.o. female who was admitted 11/20/2023 for operative treatment ofPrimary osteoarthritis of right knee. Patient has severe unremitting pain that affects sleep, daily activities, and work/hobbies. After pre-op clearance the patient was taken to the operating room on 11/20/2023 and underwent  Procedure(s): ARTHROPLASTY, KNEE, TOTAL RIGHT.    Patient was given perioperative antibiotics:  Anti-infectives (From admission, onward)    Start     Dose/Rate Route Frequency Ordered Stop   11/20/23 1330  doxycycline (VIBRA-TABS) tablet 100 mg       Note to Pharmacy: To be taken after surgery     100 mg Oral 2 times daily 11/20/23 1232     11/20/23 1330  ceFAZolin (ANCEF) IVPB 2g/100 mL premix        2 g 200 mL/hr over 30 Minutes Intravenous Every 6 hours 11/20/23 1232 11/20/23 2011   11/20/23 1004  vancomycin (VANCOCIN) powder  Status:  Discontinued          As needed 11/20/23 1009 11/20/23 1104   11/20/23 0615  ceFAZolin (ANCEF) IVPB 2g/100 mL premix        2 g 200 mL/hr over 30 Minutes Intravenous On call to O.R. 11/20/23 9391 11/20/23 0906         Patient was given sequential compression devices, early ambulation, and chemoprophylaxis to prevent DVT.  Patient benefited maximally from hospital stay and there were no complications.    Recent vital signs: Patient Vitals for the past 24 hrs:  BP Temp Temp src Pulse Resp SpO2  11/21/23 0823 (!) 119/54 98.1 F (36.7 C) Oral 74 16 100 %  11/21/23 0524 131/65 98.7 F (37.1 C) Oral 60 18 100 %  11/20/23 2320 125/64 98.4 F (36.9 C) Oral 60 18 100 %  11/20/23 1937 (!) 143/66 98.8 F (37.1 C) Oral 67 18 98 %  11/20/23 1553 (!) 149/81 97.9 F (36.6 C) Oral 68 20 100 %  11/20/23 1237 (!) 144/81 97.7 F (36.5 C) -- 62 18 100 %  11/20/23 1215 (!) 138/96 97.8 F (36.6 C) -- 60 18 98 %  11/20/23 1200 (!) 146/77 -- -- 60 16 98 %  11/20/23 1145 138/75 -- -- 60 17 100 %  11/20/23 1130 137/66 -- -- 74 20 99 %  11/20/23 1115 123/79 -- -- 65 18 98 %  11/20/23 1110 137/88 97.6 F (36.4 C) -- 66 14 100 %     Recent laboratory studies: No results for input(s): WBC, HGB, HCT, PLT, NA, K, CL, CO2, BUN, CREATININE, GLUCOSE, INR, CALCIUM  in the last 72 hours.  Invalid input(s): PT, 2   Discharge Medications:  Allergies as of 11/21/2023   No Known Allergies      Medication List     STOP taking these medications    cyclobenzaprine  5 MG tablet Commonly known as: FLEXERIL    fluconazole  150 MG tablet Commonly known as: DIFLUCAN    traMADol  50 MG tablet Commonly known as: ULTRAM        TAKE these medications    Accu-Chek Guide test strip Generic drug: glucose blood Check blood glucose level by fingerstick once per day. E11.65   Accu-Chek Guide w/Device Kit Check blood glucose level by fingerstick once per day. E11.65   Accu-Chek Softclix Lancets lancets Check blood glucose level by fingerstick once per day. E11.65   albuterol  108 (90 Base) MCG/ACT inhaler Commonly known as: VENTOLIN  HFA Inhale 2 puffs into the lungs every 6 (six) hours as  needed for wheezing or shortness of breath.   apixaban 2.5 MG Tabs tablet Commonly known as: Eliquis Take one tablet by mouth twice daily for 30 days after surgery to prevent blood clots   atorvastatin  80 MG tablet Commonly known as: LIPITOR Take 1 tablet (80 mg total) by mouth daily.   B-D UF III MINI PEN NEEDLES 31G X 5 MM Misc Generic drug: Insulin  Pen Needle Use as instructed. Inject into the skin once weekly   buPROPion  150 MG 12 hr tablet Commonly known as: WELLBUTRIN  SR TAKE 1 TABLET(150 MG) BY MOUTH TWICE DAILY   busPIRone  10 MG tablet Commonly known as: BUSPAR  TAKE 1 TABLET(10 MG) BY MOUTH TWICE DAILY   docusate sodium 100 MG capsule Commonly known as: Colace Take 1 capsule (100 mg total) by mouth daily as needed.   doxycycline 100 MG tablet Commonly known as: VIBRA-TABS Take 1 tablet (100 mg total) by mouth 2 (two) times daily. To be taken after surgery   estradiol  0.1 MG/GM vaginal cream Commonly known as: ESTRACE  Apply 1 gram per vagina every night for 2 weeks, then apply three times a week   Farxiga  5 MG Tabs tablet Generic drug: dapagliflozin  propanediol TAKE 1 TABLET BY MOUTH ONCE  DAILY BEFORE BREAKFAST   fluticasone  50 MCG/ACT nasal spray Commonly known as: FLONASE  Place 2 sprays into both nostrils daily as needed for allergies.   Intrarosa  6.5 MG Inst Generic drug: Prasterone  Insert one capsule vaginally   methocarbamol 750 MG tablet Commonly known as: ROBAXIN Take 1 tablet (750 mg total) by mouth 3 (three) times daily as needed.   montelukast  10 MG tablet Commonly known as: SINGULAIR  Take 1 tablet (10 mg total) by mouth at bedtime.   omeprazole  40 MG capsule Commonly known as: PRILOSEC Take 1 capsule (40 mg total) by mouth daily.   ondansetron  4 MG tablet Commonly known as: Zofran  Take 1 tablet (4 mg total) by mouth every 8 (eight) hours as needed for nausea or vomiting.   oxyCODONE -acetaminophen  5-325 MG tablet Commonly known as:  Percocet Take 1-2 tablets by mouth every 6 (six) hours as needed. To be taken after surgery   ramipril  2.5 MG capsule Commonly known as: ALTACE  TAKE 1 CAPSULE(2.5 MG) BY MOUTH DAILY   Trulicity  3 MG/0.5ML Soaj Generic drug: Dulaglutide  Inject 3 mg as directed once a week.               Durable Medical Equipment  (From admission, onward)           Start     Ordered   11/20/23 1233  DME Walker rolling  Once       Question Answer  Comment  Walker: With 5 Inch Wheels   Patient needs a walker to treat with the following condition Status post left partial knee replacement      11/20/23 1232   11/20/23 1233  DME 3 n 1  Once        11/20/23 1232   11/20/23 1233  DME Bedside commode  Once       Question:  Patient needs a bedside commode to treat with the following condition  Answer:  Status post left partial knee replacement   11/20/23 1232            Diagnostic Studies: DG Knee Right Port Result Date: 11/20/2023 CLINICAL DATA:  Pain, post knee arthroplasty. EXAM: PORTABLE RIGHT KNEE - 1-2 VIEW COMPARISON:  None Available. FINDINGS: Right knee arthroplasty in expected alignment. No periprosthetic lucency or fracture. There has been patellar resurfacing. Recent postsurgical change includes air and edema in the soft tissues and joint space. IMPRESSION: Right knee arthroplasty without immediate postoperative complication. Electronically Signed   By: Andrea Gasman M.D.   On: 11/20/2023 12:52    Disposition: Discharge disposition: 01-Home or Self Care          Follow-up Information     Jule Ronal CROME, PA-C. Schedule an appointment as soon as possible for a visit in 2 week(s).   Specialty: Orthopedic Surgery Contact information: [address] [place]  [place] [address] [phone]         [facility] Follow up.   Why: [facility] will contact you for the first home visit Contact information: [phone]                 Signed: Ronal CROME Jule 11/21/2023, 8:24 AM

## 2023-11-21 NOTE — Progress Notes (Signed)
 OT Cancellation Note  Patient Details Name: Ann Foster MRN: [id number] DOB: 1965-07-10   Cancelled Treatment:    Reason Eval/Treat Not Completed: OT screened, no needs identified, will sign off Per PT, pt mobilizing well, able to manage toileting tasks without assist and will have family assist for ADLs/IADLs upon DC. No formal OT eval needed at this time.  Mliss Fish 11/21/2023, 6:57 AM

## 2023-11-21 NOTE — Progress Notes (Signed)
 Physical Therapy Treatment Patient Details Name: Ann Foster MRN: [id number] DOB: [birth year] Today's Date: 11/21/2023   History of Present Illness Pt is a 57yo female who underwent R TKA on 6/23. PMH: s/p gall bladder removal    PT Comments  Pt progressing well. Pt with improved ambulation tolerance and R quad strength today. Pt able to complete necessary negotiation of step utilizing backwards technique with RW to mimic platform step to enter home. Pt c/o nausea and lightheadedness. Acute PT to cont to follow.    If plan is discharge home, recommend the following: A little help with walking and/or transfers;A little help with bathing/dressing/bathroom;Assist for transportation;Help with stairs or ramp for entrance   Can travel by private vehicle        Equipment Recommendations  Rolling walker (2 wheels);BSC/3in1 (youth RW)    Recommendations for Other Services       Precautions / Restrictions Precautions Precautions: Knee Precaution Booklet Issued: No Precaution/Restrictions Comments: educated on R LE bone foam and proper placement Restrictions Weight Bearing Restrictions Per Provider Order: No     Mobility  Bed Mobility Overal bed mobility: Needs Assistance Bed Mobility: Supine to Sit     Supine to sit: Contact guard     General bed mobility comments: HOB elevated, pt able to bring LEs to EOB    Transfers Overall transfer level: Needs assistance Equipment used: Rolling walker (2 wheels) Transfers: Sit to/from Stand Sit to Stand: Contact guard assist           General transfer comment: improved ability to stand up compared to yesterday    Ambulation/Gait Ambulation/Gait assistance: Contact guard assist Gait Distance (Feet): 150 Feet Assistive device: Rolling walker (2 wheels) Gait Pattern/deviations: Step-to pattern Gait velocity: dec Gait velocity interpretation: <1.31 ft/sec, indicative of household ambulator   General Gait Details: initially  step to gait pattern, progressed to reciprocal gait pattern with shorter step length   Stairs Stairs: Yes Stairs assistance: Contact guard assist Stair Management: Step to pattern, Backwards, With walker Number of Stairs: 1 General stair comments: mimic home set up, pt with good return demonstration   Wheelchair Mobility     Tilt Bed    Modified Rankin (Stroke Patients Only)       Balance Overall balance assessment: Needs assistance Sitting-balance support: Feet supported, No upper extremity supported Sitting balance-Leahy Scale: Good     Standing balance support: Bilateral upper extremity supported, During functional activity, Reliant on assistive device for balance Standing balance-Leahy Scale: Fair Standing balance comment: reliant on RW at this time                            Communication Communication Communication: No apparent difficulties  Cognition Arousal: Alert Behavior During Therapy: WFL for tasks assessed/performed   PT - Cognitive impairments: No apparent impairments                         Following commands: Intact      Cueing Cueing Techniques: Verbal cues  Exercises Total Joint Exercises Ankle Circles/Pumps: AROM, Both, 10 reps, Supine Quad Sets: AROM, Right, 10 reps, Supine (with 5 sec hold) Gluteal Sets: AROM, Both, 10 reps, Supine (with 5 sec hold) Long Arc Quad: AROM, Right, Seated, 10 reps    General Comments General comments (skin integrity, edema, etc.): c/o nausea/lightheadedness      Pertinent Vitals/Pain Pain Assessment Faces Pain Scale: Hurts a little bit Pain  Location: R knee, pt reports just receiving pain medicine and still feels numb Pain Descriptors / Indicators: Aching    Home Living                          Prior Function            PT Goals (current goals can now be found in the care plan section) Acute Rehab PT Goals Patient Stated Goal: home PT Goal Formulation: With  patient Time For Goal Achievement: 12/04/23 Potential to Achieve Goals: Good Progress towards PT goals: Progressing toward goals    Frequency    7X/week      PT Plan      Co-evaluation              AM-PAC PT 6 Clicks Mobility   Outcome Measure  Help needed turning from your back to your side while in a flat bed without using bedrails?: A Little Help needed moving from lying on your back to sitting on the side of a flat bed without using bedrails?: A Little Help needed moving to and from a bed to a chair (including a wheelchair)?: A Little Help needed standing up from a chair using your arms (e.g., wheelchair or bedside chair)?: A Little Help needed to walk in hospital room?: A Little Help needed climbing 3-5 steps with a railing? : A Little 6 Click Score: 18    End of Session Equipment Utilized During Treatment: Gait belt Activity Tolerance: Patient tolerated treatment well Patient left: in chair;with call bell/phone within reach Nurse Communication: Mobility status PT Visit Diagnosis: Unsteadiness on feet (R26.81);Muscle weakness (generalized) (M62.81)     Time: 0722-0746 PT Time Calculation (min) (ACUTE ONLY): 24 min  Charges:    $Gait Training: 8-22 mins $Therapeutic Exercise: 8-22 mins PT General Charges $$ ACUTE PT VISIT: 1 Visit                     Norene Foster, PT, DPT Acute Rehabilitation Services Secure chat preferred Office #: [phone]    Norene CHRISTELLA Foster 11/21/2023, 9:48 AM

## 2023-11-21 NOTE — Care Management Obs Status (Signed)
 MEDICARE OBSERVATION STATUS NOTIFICATION   Patient Details  Name: Ann Foster MRN: [id number] Date of Birth: [birth year]   Medicare Observation Status Notification Given:  Yes    Jon Cruel 11/21/2023, 9:04 AM

## 2023-11-21 NOTE — Progress Notes (Signed)
 Subjective: 1 Day Post-Op Procedure(s) (LRB): ARTHROPLASTY, KNEE, TOTAL RIGHT (Right) Patient reports pain as mild.    Objective: Vital signs in last 24 hours: Temp:  [97.6 F (36.4 C)-98.8 F (37.1 C)] 98.7 F (37.1 C) (06/24 0524) Pulse Rate:  [60-74] 60 (06/24 0524) Resp:  [14-20] 18 (06/24 0524) BP: (123-149)/(64-96) 131/65 (06/24 0524) SpO2:  [98 %-100 %] 100 % (06/24 0524)  Intake/Output from previous day: 06/23 0701 - 06/24 0700 In: 1866.8 [P.O.:720; I.V.:946.8; IV Piggyback:200] Out: 1510 [Urine:1500; Blood:10] Intake/Output this shift: No intake/output data recorded.  No results for input(s): HGB in the last 72 hours. No results for input(s): WBC, RBC, HCT, PLT in the last 72 hours. No results for input(s): NA, K, CL, CO2, BUN, CREATININE, GLUCOSE, CALCIUM  in the last 72 hours. No results for input(s): LABPT, INR in the last 72 hours.  Neurologically intact Neurovascular intact Sensation intact distally Intact pulses distally Dorsiflexion/Plantar flexion intact Incision: scant drainage No cellulitis present Compartment soft   Assessment/Plan: 1 Day Post-Op Procedure(s) (LRB): ARTHROPLASTY, KNEE, TOTAL RIGHT (Right) Advance diet Up with therapy D/C IV fluids Discharge home with home health once able to urinate on her own.  Has been cleared by PT WBAT RLE       Ronal LITTIE Grave 11/21/2023, 8:23 AM

## 2023-11-22 DIAGNOSIS — Z79891 Long term (current) use of opiate analgesic: Secondary | ICD-10-CM | POA: Diagnosis not present

## 2023-11-22 DIAGNOSIS — Z7984 Long term (current) use of oral hypoglycemic drugs: Secondary | ICD-10-CM | POA: Diagnosis not present

## 2023-11-22 DIAGNOSIS — Z8781 Personal history of (healed) traumatic fracture: Secondary | ICD-10-CM | POA: Diagnosis not present

## 2023-11-22 DIAGNOSIS — I700 Atherosclerosis of aorta: Secondary | ICD-10-CM | POA: Diagnosis not present

## 2023-11-22 DIAGNOSIS — J40 Bronchitis, not specified as acute or chronic: Secondary | ICD-10-CM | POA: Diagnosis not present

## 2023-11-22 DIAGNOSIS — G4730 Sleep apnea, unspecified: Secondary | ICD-10-CM | POA: Diagnosis not present

## 2023-11-22 DIAGNOSIS — Z7985 Long-term (current) use of injectable non-insulin antidiabetic drugs: Secondary | ICD-10-CM | POA: Diagnosis not present

## 2023-11-22 DIAGNOSIS — E119 Type 2 diabetes mellitus without complications: Secondary | ICD-10-CM | POA: Diagnosis not present

## 2023-11-22 DIAGNOSIS — Z79899 Other long term (current) drug therapy: Secondary | ICD-10-CM | POA: Diagnosis not present

## 2023-11-22 DIAGNOSIS — Z87891 Personal history of nicotine dependence: Secondary | ICD-10-CM | POA: Diagnosis not present

## 2023-11-22 DIAGNOSIS — Z604 Social exclusion and rejection: Secondary | ICD-10-CM | POA: Diagnosis not present

## 2023-11-22 DIAGNOSIS — I10 Essential (primary) hypertension: Secondary | ICD-10-CM | POA: Diagnosis not present

## 2023-11-22 DIAGNOSIS — Z7901 Long term (current) use of anticoagulants: Secondary | ICD-10-CM | POA: Diagnosis not present

## 2023-11-22 DIAGNOSIS — Z471 Aftercare following joint replacement surgery: Secondary | ICD-10-CM | POA: Diagnosis not present

## 2023-11-22 DIAGNOSIS — Z96651 Presence of right artificial knee joint: Secondary | ICD-10-CM | POA: Diagnosis not present

## 2023-11-22 DIAGNOSIS — M150 Primary generalized (osteo)arthritis: Secondary | ICD-10-CM | POA: Diagnosis not present

## 2023-11-24 DIAGNOSIS — Z604 Social exclusion and rejection: Secondary | ICD-10-CM | POA: Diagnosis not present

## 2023-11-24 DIAGNOSIS — Z7984 Long term (current) use of oral hypoglycemic drugs: Secondary | ICD-10-CM | POA: Diagnosis not present

## 2023-11-24 DIAGNOSIS — I700 Atherosclerosis of aorta: Secondary | ICD-10-CM | POA: Diagnosis not present

## 2023-11-24 DIAGNOSIS — Z7985 Long-term (current) use of injectable non-insulin antidiabetic drugs: Secondary | ICD-10-CM | POA: Diagnosis not present

## 2023-11-24 DIAGNOSIS — E119 Type 2 diabetes mellitus without complications: Secondary | ICD-10-CM | POA: Diagnosis not present

## 2023-11-24 DIAGNOSIS — Z8781 Personal history of (healed) traumatic fracture: Secondary | ICD-10-CM | POA: Diagnosis not present

## 2023-11-24 DIAGNOSIS — G4730 Sleep apnea, unspecified: Secondary | ICD-10-CM | POA: Diagnosis not present

## 2023-11-24 DIAGNOSIS — Z79899 Other long term (current) drug therapy: Secondary | ICD-10-CM | POA: Diagnosis not present

## 2023-11-24 DIAGNOSIS — Z79891 Long term (current) use of opiate analgesic: Secondary | ICD-10-CM | POA: Diagnosis not present

## 2023-11-24 DIAGNOSIS — Z471 Aftercare following joint replacement surgery: Secondary | ICD-10-CM | POA: Diagnosis not present

## 2023-11-24 DIAGNOSIS — I10 Essential (primary) hypertension: Secondary | ICD-10-CM | POA: Diagnosis not present

## 2023-11-24 DIAGNOSIS — Z7901 Long term (current) use of anticoagulants: Secondary | ICD-10-CM | POA: Diagnosis not present

## 2023-11-24 DIAGNOSIS — J40 Bronchitis, not specified as acute or chronic: Secondary | ICD-10-CM | POA: Diagnosis not present

## 2023-11-24 DIAGNOSIS — M150 Primary generalized (osteo)arthritis: Secondary | ICD-10-CM | POA: Diagnosis not present

## 2023-11-24 DIAGNOSIS — Z96651 Presence of right artificial knee joint: Secondary | ICD-10-CM | POA: Diagnosis not present

## 2023-11-24 DIAGNOSIS — Z87891 Personal history of nicotine dependence: Secondary | ICD-10-CM | POA: Diagnosis not present

## 2023-11-27 DIAGNOSIS — Z604 Social exclusion and rejection: Secondary | ICD-10-CM | POA: Diagnosis not present

## 2023-11-27 DIAGNOSIS — I10 Essential (primary) hypertension: Secondary | ICD-10-CM | POA: Diagnosis not present

## 2023-11-27 DIAGNOSIS — G4730 Sleep apnea, unspecified: Secondary | ICD-10-CM | POA: Diagnosis not present

## 2023-11-27 DIAGNOSIS — I700 Atherosclerosis of aorta: Secondary | ICD-10-CM | POA: Diagnosis not present

## 2023-11-27 DIAGNOSIS — Z7984 Long term (current) use of oral hypoglycemic drugs: Secondary | ICD-10-CM | POA: Diagnosis not present

## 2023-11-27 DIAGNOSIS — Z79899 Other long term (current) drug therapy: Secondary | ICD-10-CM | POA: Diagnosis not present

## 2023-11-27 DIAGNOSIS — E119 Type 2 diabetes mellitus without complications: Secondary | ICD-10-CM | POA: Diagnosis not present

## 2023-11-27 DIAGNOSIS — Z8781 Personal history of (healed) traumatic fracture: Secondary | ICD-10-CM | POA: Diagnosis not present

## 2023-11-27 DIAGNOSIS — M150 Primary generalized (osteo)arthritis: Secondary | ICD-10-CM | POA: Diagnosis not present

## 2023-11-27 DIAGNOSIS — Z87891 Personal history of nicotine dependence: Secondary | ICD-10-CM | POA: Diagnosis not present

## 2023-11-27 DIAGNOSIS — J40 Bronchitis, not specified as acute or chronic: Secondary | ICD-10-CM | POA: Diagnosis not present

## 2023-11-27 DIAGNOSIS — Z79891 Long term (current) use of opiate analgesic: Secondary | ICD-10-CM | POA: Diagnosis not present

## 2023-11-27 DIAGNOSIS — Z471 Aftercare following joint replacement surgery: Secondary | ICD-10-CM | POA: Diagnosis not present

## 2023-11-27 DIAGNOSIS — Z7901 Long term (current) use of anticoagulants: Secondary | ICD-10-CM | POA: Diagnosis not present

## 2023-11-27 DIAGNOSIS — Z7985 Long-term (current) use of injectable non-insulin antidiabetic drugs: Secondary | ICD-10-CM | POA: Diagnosis not present

## 2023-11-27 DIAGNOSIS — Z96651 Presence of right artificial knee joint: Secondary | ICD-10-CM | POA: Diagnosis not present

## 2023-11-29 DIAGNOSIS — G4730 Sleep apnea, unspecified: Secondary | ICD-10-CM | POA: Diagnosis not present

## 2023-11-29 DIAGNOSIS — E119 Type 2 diabetes mellitus without complications: Secondary | ICD-10-CM | POA: Diagnosis not present

## 2023-11-29 DIAGNOSIS — Z79899 Other long term (current) drug therapy: Secondary | ICD-10-CM | POA: Diagnosis not present

## 2023-11-29 DIAGNOSIS — Z96651 Presence of right artificial knee joint: Secondary | ICD-10-CM | POA: Diagnosis not present

## 2023-11-29 DIAGNOSIS — Z87891 Personal history of nicotine dependence: Secondary | ICD-10-CM | POA: Diagnosis not present

## 2023-11-29 DIAGNOSIS — I10 Essential (primary) hypertension: Secondary | ICD-10-CM | POA: Diagnosis not present

## 2023-11-29 DIAGNOSIS — Z7901 Long term (current) use of anticoagulants: Secondary | ICD-10-CM | POA: Diagnosis not present

## 2023-11-29 DIAGNOSIS — J40 Bronchitis, not specified as acute or chronic: Secondary | ICD-10-CM | POA: Diagnosis not present

## 2023-11-29 DIAGNOSIS — Z79891 Long term (current) use of opiate analgesic: Secondary | ICD-10-CM | POA: Diagnosis not present

## 2023-11-29 DIAGNOSIS — Z7984 Long term (current) use of oral hypoglycemic drugs: Secondary | ICD-10-CM | POA: Diagnosis not present

## 2023-11-29 DIAGNOSIS — I700 Atherosclerosis of aorta: Secondary | ICD-10-CM | POA: Diagnosis not present

## 2023-11-29 DIAGNOSIS — Z604 Social exclusion and rejection: Secondary | ICD-10-CM | POA: Diagnosis not present

## 2023-11-29 DIAGNOSIS — Z7985 Long-term (current) use of injectable non-insulin antidiabetic drugs: Secondary | ICD-10-CM | POA: Diagnosis not present

## 2023-11-29 DIAGNOSIS — Z8781 Personal history of (healed) traumatic fracture: Secondary | ICD-10-CM | POA: Diagnosis not present

## 2023-11-29 DIAGNOSIS — M150 Primary generalized (osteo)arthritis: Secondary | ICD-10-CM | POA: Diagnosis not present

## 2023-11-29 DIAGNOSIS — Z471 Aftercare following joint replacement surgery: Secondary | ICD-10-CM | POA: Diagnosis not present

## 2023-11-30 DIAGNOSIS — Z8781 Personal history of (healed) traumatic fracture: Secondary | ICD-10-CM | POA: Diagnosis not present

## 2023-11-30 DIAGNOSIS — Z87891 Personal history of nicotine dependence: Secondary | ICD-10-CM | POA: Diagnosis not present

## 2023-11-30 DIAGNOSIS — Z79891 Long term (current) use of opiate analgesic: Secondary | ICD-10-CM | POA: Diagnosis not present

## 2023-11-30 DIAGNOSIS — Z96651 Presence of right artificial knee joint: Secondary | ICD-10-CM | POA: Diagnosis not present

## 2023-11-30 DIAGNOSIS — J40 Bronchitis, not specified as acute or chronic: Secondary | ICD-10-CM | POA: Diagnosis not present

## 2023-11-30 DIAGNOSIS — Z7984 Long term (current) use of oral hypoglycemic drugs: Secondary | ICD-10-CM | POA: Diagnosis not present

## 2023-11-30 DIAGNOSIS — Z79899 Other long term (current) drug therapy: Secondary | ICD-10-CM | POA: Diagnosis not present

## 2023-11-30 DIAGNOSIS — Z7901 Long term (current) use of anticoagulants: Secondary | ICD-10-CM | POA: Diagnosis not present

## 2023-11-30 DIAGNOSIS — I700 Atherosclerosis of aorta: Secondary | ICD-10-CM | POA: Diagnosis not present

## 2023-11-30 DIAGNOSIS — Z604 Social exclusion and rejection: Secondary | ICD-10-CM | POA: Diagnosis not present

## 2023-11-30 DIAGNOSIS — M150 Primary generalized (osteo)arthritis: Secondary | ICD-10-CM | POA: Diagnosis not present

## 2023-11-30 DIAGNOSIS — I10 Essential (primary) hypertension: Secondary | ICD-10-CM | POA: Diagnosis not present

## 2023-11-30 DIAGNOSIS — Z7985 Long-term (current) use of injectable non-insulin antidiabetic drugs: Secondary | ICD-10-CM | POA: Diagnosis not present

## 2023-11-30 DIAGNOSIS — Z471 Aftercare following joint replacement surgery: Secondary | ICD-10-CM | POA: Diagnosis not present

## 2023-11-30 DIAGNOSIS — E119 Type 2 diabetes mellitus without complications: Secondary | ICD-10-CM | POA: Diagnosis not present

## 2023-11-30 DIAGNOSIS — G4730 Sleep apnea, unspecified: Secondary | ICD-10-CM | POA: Diagnosis not present

## 2023-12-01 ENCOUNTER — Other Ambulatory Visit: Payer: Self-pay | Admitting: Family Medicine

## 2023-12-01 DIAGNOSIS — E1165 Type 2 diabetes mellitus with hyperglycemia: Secondary | ICD-10-CM

## 2023-12-04 DIAGNOSIS — E119 Type 2 diabetes mellitus without complications: Secondary | ICD-10-CM | POA: Diagnosis not present

## 2023-12-04 DIAGNOSIS — Z79891 Long term (current) use of opiate analgesic: Secondary | ICD-10-CM | POA: Diagnosis not present

## 2023-12-04 DIAGNOSIS — Z96651 Presence of right artificial knee joint: Secondary | ICD-10-CM | POA: Diagnosis not present

## 2023-12-04 DIAGNOSIS — Z8781 Personal history of (healed) traumatic fracture: Secondary | ICD-10-CM | POA: Diagnosis not present

## 2023-12-04 DIAGNOSIS — Z7985 Long-term (current) use of injectable non-insulin antidiabetic drugs: Secondary | ICD-10-CM | POA: Diagnosis not present

## 2023-12-04 DIAGNOSIS — G4730 Sleep apnea, unspecified: Secondary | ICD-10-CM | POA: Diagnosis not present

## 2023-12-04 DIAGNOSIS — Z604 Social exclusion and rejection: Secondary | ICD-10-CM | POA: Diagnosis not present

## 2023-12-04 DIAGNOSIS — I10 Essential (primary) hypertension: Secondary | ICD-10-CM | POA: Diagnosis not present

## 2023-12-04 DIAGNOSIS — I700 Atherosclerosis of aorta: Secondary | ICD-10-CM | POA: Diagnosis not present

## 2023-12-04 DIAGNOSIS — Z471 Aftercare following joint replacement surgery: Secondary | ICD-10-CM | POA: Diagnosis not present

## 2023-12-04 DIAGNOSIS — Z7901 Long term (current) use of anticoagulants: Secondary | ICD-10-CM | POA: Diagnosis not present

## 2023-12-04 DIAGNOSIS — M150 Primary generalized (osteo)arthritis: Secondary | ICD-10-CM | POA: Diagnosis not present

## 2023-12-04 DIAGNOSIS — Z79899 Other long term (current) drug therapy: Secondary | ICD-10-CM | POA: Diagnosis not present

## 2023-12-04 DIAGNOSIS — Z87891 Personal history of nicotine dependence: Secondary | ICD-10-CM | POA: Diagnosis not present

## 2023-12-04 DIAGNOSIS — J40 Bronchitis, not specified as acute or chronic: Secondary | ICD-10-CM | POA: Diagnosis not present

## 2023-12-04 DIAGNOSIS — Z7984 Long term (current) use of oral hypoglycemic drugs: Secondary | ICD-10-CM | POA: Diagnosis not present

## 2023-12-05 ENCOUNTER — Ambulatory Visit (INDEPENDENT_AMBULATORY_CARE_PROVIDER_SITE_OTHER): Admitting: Physician Assistant

## 2023-12-05 DIAGNOSIS — H73891 Other specified disorders of tympanic membrane, right ear: Secondary | ICD-10-CM | POA: Diagnosis not present

## 2023-12-05 DIAGNOSIS — H65491 Other chronic nonsuppurative otitis media, right ear: Secondary | ICD-10-CM | POA: Diagnosis not present

## 2023-12-05 DIAGNOSIS — F17210 Nicotine dependence, cigarettes, uncomplicated: Secondary | ICD-10-CM | POA: Diagnosis not present

## 2023-12-05 DIAGNOSIS — Z96651 Presence of right artificial knee joint: Secondary | ICD-10-CM

## 2023-12-05 DIAGNOSIS — H9071 Mixed conductive and sensorineural hearing loss, unilateral, right ear, with unrestricted hearing on the contralateral side: Secondary | ICD-10-CM | POA: Diagnosis not present

## 2023-12-05 DIAGNOSIS — H7491 Unspecified disorder of right middle ear and mastoid: Secondary | ICD-10-CM | POA: Diagnosis not present

## 2023-12-05 DIAGNOSIS — Z0110 Encounter for examination of ears and hearing without abnormal findings: Secondary | ICD-10-CM | POA: Diagnosis not present

## 2023-12-05 MED ORDER — OXYCODONE-ACETAMINOPHEN 5-325 MG PO TABS: 1.0000 | ORAL_TABLET | Freq: Three times a day (TID) | ORAL | 0 refills | Status: DC | PRN

## 2023-12-05 MED ORDER — METHOCARBAMOL 750 MG PO TABS: 750.0000 mg | ORAL_TABLET | Freq: Three times a day (TID) | ORAL | 2 refills | Status: DC | PRN

## 2023-12-05 MED ORDER — METHYLPREDNISOLONE 4 MG PO TBPK: ORAL_TABLET | ORAL | 0 refills | Status: DC

## 2023-12-05 NOTE — Progress Notes (Signed)
 Post-Op Visit Note   Patient: Ann Foster           Date of Birth: [birth year]           MRN: [id number] Visit Date: 12/05/2023 PCP: Theotis Haze ORN, NP   Assessment & Plan:  Chief Complaint:  Chief Complaint  Patient presents with   Right Knee - Routine Post Op    11/20/23 RT TKA   Visit Diagnoses:  1. Status post total right knee replacement     Plan: Home patient is a pleasant 58 year old female who comes in today 2 weeks status post right total knee replacement 11/20/2023.  She has been doing okay.  She has been taking oxycodone  and Robaxin  for pain.  She has been compliant taking Eliquis  for DVT prophylaxis.  She has been getting home health PT and is ambulating with a walker.  Scheduled to start outpatient PT tomorrow.  Examination of the right knee reveals well-healing surgical incision with nylon sutures in place.  No evidence of infection or cellulitis.  Calves are soft nontender.  She is neurovascularly intact distally.  Today, sutures were removed and Steri-Strips applied.  I have refilled her Robaxin  and oxycodone  and have added a Medrol  Dosepak as she seems to be getting some symptoms similar to sciatica.  She will keep a close eye on her blood sugar she is a type II diabetic.  She will start outpatient PT tomorrow.  Continue with the Eliquis  until she is 4 weeks postop and then transition to a baby aspirin twice daily for 2 weeks.  Follow-up in 4 weeks for repeat evaluation and 2 view x-rays of the right knee.  Call with concerns or questions.  Follow-Up Instructions: Return in about 4 weeks (around 01/02/2024).   Orders:  No orders of the defined types were placed in this encounter.  Meds ordered this encounter  Medications   oxyCODONE -acetaminophen  (PERCOCET) 5-325 MG tablet    Sig: Take 1-2 tablets by mouth 3 (three) times daily as needed.    Dispense:  40 tablet    Refill:  0   methocarbamol  (ROBAXIN ) 750 MG tablet    Sig: Take 1 tablet (750 mg total) by mouth  3 (three) times daily as needed.    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, with long-term current use of insulin  (HCC) 04/25/2022   Amblyopia of eye, right 07/27/2021   Corneal guttata of left eye 07/27/2021   Influenza vaccine refused 07/29/2020   COVID-19 vaccine dose declined 07/29/2020   Vasomotor symptoms due to menopause 04/24/2020   Atrophic vaginitis 04/24/2020   Impingement syndrome of left shoulder 01/23/2018   Past Medical History:  Diagnosis Date   Ankle fracture 2016   Right   Aortic atherosclerosis (HCC)    trace calcific atherosclerosis aortic arch per ct neck done 12-22-17   Bronchitis    Diabetes mellitus without complication (HCC)    Hypertension  OA (osteoarthritis) of shoulder    left shoulder, both knees arthritis   Osteoarthritis, knee    Sleep apnea    wears CPAP    Family History  Problem Relation Age of Onset   Breast cancer Mother    Colon cancer Father 22   Diabetes Son    Esophageal cancer Neg Hx    Stomach cancer Neg Hx    Liver disease Neg Hx    Pancreatic cancer Neg Hx    Rectal cancer Neg Hx     Past Surgical History:  Procedure Laterality Date   CHOLECYSTECTOMY N/A 04/15/2023   Procedure: LAPAROSCOPIC CHOLECYSTECTOMY WITH ICG DYE;  Surgeon: Ebbie Cough, MD;  Location: [facility];  Service: General;  Laterality: N/A;   COLONOSCOPY     TOTAL KNEE ARTHROPLASTY Right 11/20/2023   Procedure: ARTHROPLASTY, KNEE, TOTAL RIGHT;  Surgeon: Jerri Kay HERO, MD;   Location: [facility];  Service: Orthopedics;  Laterality: Right;   Social History   Occupational History   Not on file  Tobacco Use   Smoking status: Former    Current packs/day: 0.00    Types: Cigarettes    Quit date: 11/28/2019    Years since quitting: 4.0   Smokeless tobacco: Never  Vaping Use   Vaping status: Never Used  Substance and Sexual Activity   Alcohol use: Not Currently   Drug use: No   Sexual activity: Not Currently    Birth control/protection: Condom

## 2023-12-05 NOTE — Therapy (Incomplete)
 OUTPATIENT PHYSICAL THERAPY LOWER EXTREMITY EVALUATION   Patient Name: Ann Foster MRN: [id number] DOB: [birth year], 58 y.o., female Today's Date: 12/06/2023   END OF SESSION:  PT End of Session - 12/06/23 0954     Visit Number 1    Date for PT Re-Evaluation 01/31/24    Authorization Type [employer] Dual complete    PT Start Time 0955    PT Stop Time 1100    PT Time Calculation (min) 65 min    Activity Tolerance Patient tolerated treatment well;Patient limited by pain    Behavior During Therapy Ann Foster for tasks assessed/performed          Past Medical History:  Diagnosis Date   Ankle fracture 2016   Right   Aortic atherosclerosis (HCC)    trace calcific atherosclerosis aortic arch per ct neck done 12-22-17   Bronchitis    Diabetes mellitus without complication (HCC)    Hypertension    OA (osteoarthritis) of shoulder    left shoulder, both knees arthritis   Osteoarthritis, knee    Sleep apnea    wears CPAP   Past Surgical History:  Procedure Laterality Date   CHOLECYSTECTOMY N/A 04/15/2023   Procedure: LAPAROSCOPIC CHOLECYSTECTOMY WITH ICG DYE;  Surgeon: Ann Cough, MD;  Location: [facility];  Service: General;  Laterality: N/A;   COLONOSCOPY     TOTAL KNEE ARTHROPLASTY Right 11/20/2023   Procedure: ARTHROPLASTY, KNEE, TOTAL RIGHT;  Surgeon: Ann Kay HERO, MD;  Location: [facility];  Service: Orthopedics;  Laterality: Right;   Patient Active Problem List   Diagnosis Date Noted   Status post total right knee replacement 11/20/2023   Primary osteoarthritis of right knee 09/26/2023   Snoring 06/07/2023   Acute cholecystitis 04/13/2023   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, with long-term current use of insulin  (HCC) 04/25/2022   Amblyopia of eye, right 07/27/2021   Corneal guttata of left eye 07/27/2021   Influenza vaccine refused 07/29/2020   COVID-19 vaccine dose declined 07/29/2020   Vasomotor symptoms  due to menopause 04/24/2020   Atrophic vaginitis 04/24/2020   Impingement syndrome of left shoulder 01/23/2018    PCP: Ann Haze ORN, NP   REFERRING PROVIDER: Jerri Kay HERO, MD   REFERRING DIAG:  M17.11 (ICD-10-CM) - Primary osteoarthritis of right knee  THERAPY DIAG:  Acute pain of right knee  Stiffness of right knee, not elsewhere classified  Difficulty in walking, not elsewhere classified  Muscle weakness (generalized)  Localized edema  RATIONALE FOR EVALUATION AND TREATMENT: Rehabilitation  ONSET DATE: 11/20/23  NEXT MD VISIT: 01/02/24 Dr Ann.   SUBJECTIVE:  SUBJECTIVE STATEMENT: Patient referred to PT following  R TKA on by DR Ann Foster on 11/20/23.   She has had 2 weeks HH PT.     She is also c/o nerve pain in the R hip and anterolateral thigh that has been present since surgery and is attributed to the nerve block.  She had her bandage removed yesterday and has steri strips in place.  There is no redness/drainage/dehischence noted.  Calf edema is minimal.  Homan's is negative RLE.   Patient has end stage L knee OA as well and hopes to the L TKA done before the year is out once she is recovered from this surgery.  She has been walker dependent since surgery, but no falls.  PAIN: Are you having pain? Yes: NPRS scale: 6 Pain location: R knee Pain description: dull to sharp variably  Aggravating factors: end ROM flex/ext; prlonged walking Relieving factors: ice, meds, rest, elevation  PERTINENT HISTORY:  Bronchitis, Diabetes, Htn, OA  PRECAUTIONS: None  RED FLAGS: None  WEIGHT BEARING RESTRICTIONS: No  FALLS:  Has patient fallen in last 6 months? No  LIVING ENVIRONMENT: Lives with: lives with their family Lives in: House/apartment Stairs: Yes: External: 2 steps;  none Has following equipment at home: Walker - 2 wheeled, shower chair, and bed side commode  OCCUPATION:  disabled  PLOF: Independentused cane some prior to surgery due to pain, but normally independent with no device  PATIENT GOALS: walk normally without pain and get my other knee done   OBJECTIVE: (objective measures completed at initial evaluation unless otherwise dated)  PATIENT SURVEYS:  LEFS = 49/80  COGNITION: Overall cognitive status: Within functional limits for tasks assessed    SENSATION: WFL  EDEMA:  Circumferential: 42.5 cm on L knee; 44.5 cm on R knee  POSTURE:  No Significant postural limitations  PALPATION: TTP over the medial and lateral R knee, along with R mid to distal IT band, and distal quads  MUSCLE LENGTH: Hamstrings: Right 80 deg; Left 90  deg Hamstrings: 90 degrees + SLR BLE ITB: mild tightness RLE Heelcord: mild tight RLE  LOWER EXTREMITY ROM:  Active ROM Right eval Left eval  Hip flexion    Hip extension    Hip abduction    Hip adduction    Hip internal rotation    Hip external rotation    Knee flexion 70 120  Knee extension -10 0  Ankle dorsiflexion 0 5  Ankle plantarflexion    Ankle inversion    Ankle eversion     Passive ROM Right eval Left eval  Hip flexion    Hip extension    Hip abduction    Hip adduction    Hip internal rotation    Hip external rotation    Knee flexion 80   Knee extension -6   Ankle dorsiflexion    Ankle plantarflexion    Ankle inversion    Ankle eversion    (Blank rows = not tested)  LOWER EXTREMITY MMT:  MMT Right eval Left eval  Hip flexion 4- 4+  Hip extension    Hip abduction 4- 4+  Hip adduction    Hip internal rotation 3+ 4  Hip external rotation 4- 5  Knee flexion 4+ 5  Knee extension 4- 5  Ankle dorsiflexion 4 4  Ankle plantarflexion    Ankle inversion    Ankle eversion     (Blank rows = not tested)   FUNCTIONAL TESTS:  5x STS = 22.41 TUG = 18.39  DGI =  16    GAIT: Distance walked: 200 feet into clinic Assistive device utilized: Walker - 2 wheeled Level of assistance: Complete Independence Gait pattern: decreased stance time- Right and antalgic Comments: ambulates independently with FWW with decreased R knee flexion in swing, decreased stance time RLE with mild limp and decreased WB RLE;   can ambulate with a SPC but more pronounced limp and requires min gait belt assist for safety;   able to ambulate down/up 1 flight steps but has to ambulate 2 feet to same step using BUE on same rail   TODAY'S TREATMENT:  12/06/23 Initial HEP, safety with gait with walker and cane, instruction for ice 3-4 x daily/15-30 min to R knee; keeping R kne straight when lying in bed without pillows; no submerging incision   PATIENT EDUCATION:  Education details: PT eval findings, anticipated POC, initial HEP, transfer safety, and gait safety with spc and fww  Person educated: Patient Education method: Explanation, Demonstration, Verbal cues, Tactile cues, and MedBridge[employer] app access provided Education comprehension: verbalized understanding, verbal cues required, tactile cues required, and needs further education  HOME EXERCISE PROGRAM: Access Code: [email or web address] URL: [email or web address] Date: 12/06/2023 Prepared by: Garnette Montclair  Exercises - Supine Ankle Pumps  - 3 x daily - 7 x weekly - 1 sets - 10 reps - Supine Quad Set  - 3 x daily - 7 x weekly - 1 sets - 10 reps - Straight Leg Raise  - 3 x daily - 7 x weekly - 1 sets - 10 reps - Supine Heel Slides  - 3 x daily - 7 x weekly - 1 sets - 10 reps - Supine Hip Abduction  - 3 x daily - 7 x weekly - 1 sets - 10 reps - Seated Long Arc Quad  - 3 x daily - 7 x weekly - 1 sets - 10 reps - Seated Knee Flexion  - 3 x daily - 7 x weekly - 1 sets - 10 reps - Supine Knee Extension Strengthening  - 1 x daily - 7 x weekly - 3 sets - 10 reps - Standing Heel Raise with Support  - 1 x daily - 7 x weekly - 3  sets - 5 reps - Toe Raises with Counter Support  - 1 x daily - 7 x weekly - 3 sets - 5 reps - Mini Squats at Table  - 1 x daily - 7 x weekly - 3 sets - 5 reps - Standing Hip Flexion AROM  - 1 x daily - 7 x weekly - 3 sets - 5 reps - Standing Hip Abduction  - 1 x daily - 7 x weekly - 3 sets - 5 reps - Standing Hip Extension  - 1 x daily - 7 x weekly - 3 sets - 5 reps - Standing Knee Flexion AROM with Chair Support  - 1 x daily - 7 x weekly - 3 sets - 5 reps   ASSESSMENT:  CLINICAL IMPRESSION: Ann Foster is a 58 y.o. female who was referred to physical therapy for evaluation and treatment following a R TKA by Dr Ann 2 weeks ago.   Patient reports onset of R knee pain beginning at least a year ago. Pain is worse with R knee flexion and extension and with FWB unsupported on RLE.  Patient has deficits in R knee ROM, R Ann flexibility, RLE strength, balance, safety, and HEP understanding which are interfering with ADLs and are impacting quality of life.  On LEFS patient scored 49/80 demonstrating 38.75% functional limitation.  Sarely will benefit from skilled PT to address above deficits to improve mobility and activity tolerance with decreased pain interference.  OBJECTIVE IMPAIRMENTS: Abnormal gait, decreased balance, decreased knowledge of use of DME, decreased mobility, difficulty walking, decreased ROM, decreased strength, decreased safety awareness, increased edema, increased muscle spasms, impaired flexibility, and pain.   ACTIVITY LIMITATIONS: carrying, lifting, bending, standing, squatting, sleeping, stairs, and locomotion level  PARTICIPATION LIMITATIONS: meal prep, cleaning, laundry, driving, shopping, and community activity  PERSONAL FACTORS: Age, Fitness, and 1-2 comorbidities: Bronchitis, Diabetes, Htn, OA are also affecting patient's functional outcome.   REHAB POTENTIAL: Good  CLINICAL DECISION MAKING: Evolving/moderate complexity  EVALUATION COMPLEXITY:  Moderate   GOALS: Goals reviewed with patient? Yes  SHORT TERM GOALS: Target date: 01/03/2024  Patient will be independent with initial HEP. Baseline: 100% PT assist required for correct completion  Goal status: INITIAL  2.  Patient will report at least 25% improvement in R knee pain to improve QOL. Baseline: 6/10 average, 8/10 worst Goal status: INITIAL  LONG TERM GOALS: Target date: 01/31/2024  Patient will be independent with advanced/ongoing HEP to improve outcomes and carryover.  Baseline: no advanced HEP yet Goal status: INITIAL  2.  Patient will report at least 50-75% improvement in R knee pain to improve QOL. Baseline: 6-8/10 Goal status: INITIAL  3.  Patient will demonstrate improved R knee AROM to 0-120 degrees to allow for normal gait and stair mechanics. Baseline: Refer to above Ann ROM table Goal status: INITIAL  4.  Patient will demonstrate improved R knee and RLE strength to >/= 4+/5 for improved stability and ease of mobility. Baseline: Refer to above Ann MMT table Goal status: INITIAL  5.  Patient will be able to ambulate x 10-20 minutes with no device, and normal gait pattern without increased pain to access community for shopping Baseline: 200 feet with walker Goal status: INITIAL  6. Patient will be able to ascend/descend stairs with 1 HR and reciprocal step pattern safely to access home and community.  Baseline: both hands required and 2 foot on 1 step advancedment Goal status: INITIAL  7.  Patient will report >/= 60/80 on LEFS (MCID = 9 pts) to demonstrate improved functional ability. Baseline: 49  Goal status: INITIAL  8.  Patient will demonstrate at least 20/24 on DGI to decrease risk of falls. Baseline: 16/20 Goal status: INITIAL     PLAN:  PT FREQUENCY: 2x/week  PT DURATION: 8 weeks  PLANNED INTERVENTIONS: {97164- PT Re-evaluation, 97750- Physical Performance Testing, 97110-Therapeutic exercises, 97530- Therapeutic activity, 97112-  Neuromuscular re-education, 97535- Self Care, [id number]- Manual therapy, [phone]- Gait training, [phone]- Electrical stimulation (unattended), 97016- Vasopneumatic device, [phone] (1-2 muscles), 20561 (3+ muscles)- Dry Needling, Patient/Family education, Balance training, Stair training, Taping, Joint mobilization, DME instructions, Cryotherapy, Moist heat, and Biofeedback  PLAN FOR NEXT SESSION: Progress gait with SPC, add bike partial ROM, flexion stretching SKTC, prone ext stretching, balance activities   Chalice Philbert, PT 12/06/2023, 12:56 PM

## 2023-12-06 ENCOUNTER — Ambulatory Visit: Attending: Orthopaedic Surgery | Admitting: Rehabilitation

## 2023-12-06 ENCOUNTER — Other Ambulatory Visit: Payer: Self-pay

## 2023-12-06 DIAGNOSIS — M1711 Unilateral primary osteoarthritis, right knee: Secondary | ICD-10-CM | POA: Diagnosis not present

## 2023-12-06 DIAGNOSIS — M25561 Pain in right knee: Secondary | ICD-10-CM | POA: Diagnosis present

## 2023-12-06 DIAGNOSIS — M25661 Stiffness of right knee, not elsewhere classified: Secondary | ICD-10-CM | POA: Insufficient documentation

## 2023-12-06 DIAGNOSIS — M6281 Muscle weakness (generalized): Secondary | ICD-10-CM | POA: Diagnosis present

## 2023-12-06 DIAGNOSIS — R262 Difficulty in walking, not elsewhere classified: Secondary | ICD-10-CM | POA: Insufficient documentation

## 2023-12-06 DIAGNOSIS — R600 Localized edema: Secondary | ICD-10-CM | POA: Diagnosis present

## 2023-12-08 ENCOUNTER — Ambulatory Visit: Admitting: Rehabilitation

## 2023-12-08 DIAGNOSIS — R600 Localized edema: Secondary | ICD-10-CM

## 2023-12-08 DIAGNOSIS — M25561 Pain in right knee: Secondary | ICD-10-CM | POA: Diagnosis not present

## 2023-12-08 DIAGNOSIS — M25661 Stiffness of right knee, not elsewhere classified: Secondary | ICD-10-CM

## 2023-12-08 DIAGNOSIS — R262 Difficulty in walking, not elsewhere classified: Secondary | ICD-10-CM

## 2023-12-08 DIAGNOSIS — M6281 Muscle weakness (generalized): Secondary | ICD-10-CM

## 2023-12-08 NOTE — Therapy (Signed)
 OUTPATIENT PHYSICAL THERAPY LOWER EXTREMITY EVALUATION   Patient Name: Ann Foster MRN: [id number] DOB: Oct 18, 1965, 58 y.o., female Today's Date: 12/08/2023   END OF SESSION:  PT End of Session - 12/08/23 0943     Visit Number 2    Date for PT Re-Evaluation 01/31/24    Authorization Type [employer] Dual complete    PT Start Time 0938    PT Stop Time 1040    PT Time Calculation (min) 62 min    Activity Tolerance Patient tolerated treatment well;Patient limited by pain    Behavior During Therapy [facility] for tasks assessed/performed           Past Medical History:  Diagnosis Date   Ankle fracture 2016   Right   Aortic atherosclerosis (HCC)    trace calcific atherosclerosis aortic arch per ct neck done 12-22-17   Bronchitis    Diabetes mellitus without complication (HCC)    Hypertension    OA (osteoarthritis) of shoulder    left shoulder, both knees arthritis   Osteoarthritis, knee    Sleep apnea    wears CPAP   Past Surgical History:  Procedure Laterality Date   CHOLECYSTECTOMY N/A 04/15/2023   Procedure: LAPAROSCOPIC CHOLECYSTECTOMY WITH ICG DYE;  Surgeon: Ebbie Cough, MD;  Location: [facility];  Service: General;  Laterality: N/A;   COLONOSCOPY     TOTAL KNEE ARTHROPLASTY Right 11/20/2023   Procedure: ARTHROPLASTY, KNEE, TOTAL RIGHT;  Surgeon: Jerri Kay HERO, MD;  Location: [facility];  Service: Orthopedics;  Laterality: Right;   Patient Active Problem List   Diagnosis Date Noted   Status post total right knee replacement 11/20/2023   Primary osteoarthritis of right knee 09/26/2023   Snoring 06/07/2023   Acute cholecystitis 04/13/2023   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, with long-term current use of insulin  (HCC) 04/25/2022   Amblyopia of eye, right 07/27/2021   Corneal guttata of left eye 07/27/2021   Influenza vaccine refused 07/29/2020   COVID-19 vaccine dose declined 07/29/2020   Vasomotor  symptoms due to menopause 04/24/2020   Atrophic vaginitis 04/24/2020   Impingement syndrome of left shoulder 01/23/2018    PCP: Theotis Haze ORN, NP   REFERRING PROVIDER: Jerri Kay HERO, MD   REFERRING DIAG:  M17.11 (ICD-10-CM) - Primary osteoarthritis of right knee  THERAPY DIAG:  Acute pain of right knee  Stiffness of right knee, not elsewhere classified  Difficulty in walking, not elsewhere classified  Muscle weakness (generalized)  Localized edema  RATIONALE FOR EVALUATION AND TREATMENT: Rehabilitation  ONSET DATE: 11/20/23  NEXT MD VISIT: 01/02/24 Dr Jerri.   SUBJECTIVE:  SUBJECTIVE STATEMENT: Pt. States was sore after last PT session, but able to continue with her HEP without difficulty.   She reports she is using her SPC some around the home.    Patient referred to PT following  R TKA on by DR XU on 11/20/23.   She has had 2 weeks HH PT.     She is also c/o nerve pain in the R hip and anterolateral thigh that has been present since surgery and is attributed to the nerve block.  She had her bandage removed yesterday and has steri strips in place.  There is no redness/drainage/dehischence noted.  Calf edema is minimal.  Homan's is negative RLE.   Patient has end stage L knee OA as well and hopes to the L TKA done before the year is out once she is recovered from this surgery.  She has been walker dependent since surgery, but no falls.  PAIN: Are you having pain? Yes: NPRS scale: 7/10 Pain location: R knee Pain description: dull to sharp variably  Aggravating factors: end ROM flex/ext; prlonged walking Relieving factors: ice, meds, rest, elevation  PERTINENT HISTORY:  Bronchitis, Diabetes, Htn, OA  PRECAUTIONS: None  RED FLAGS: None  WEIGHT BEARING RESTRICTIONS:  No  FALLS:  Has patient fallen in last 6 months? No  LIVING ENVIRONMENT: Lives with: lives with their family Lives in: House/apartment Stairs: Yes: External: 2 steps; none Has following equipment at home: Walker - 2 wheeled, shower chair, and bed side commode  OCCUPATION:  disabled  PLOF: Independentused cane some prior to surgery due to pain, but normally independent with no device  PATIENT GOALS: walk normally without pain and get my other knee done   OBJECTIVE: (objective measures completed at initial evaluation unless otherwise dated)  PATIENT SURVEYS:  LEFS = 49/80  COGNITION: Overall cognitive status: Within functional limits for tasks assessed    SENSATION: WFL  EDEMA:  Circumferential: 42.5 cm on L knee; 44.5 cm on R knee  POSTURE:  No Significant postural limitations  PALPATION: TTP over the medial and lateral R knee, along with R mid to distal IT band, and distal quads  MUSCLE LENGTH: Hamstrings: Right 80 deg; Left 90  deg Hamstrings: 90 degrees + SLR BLE ITB: mild tightness RLE Heelcord: mild tight RLE  LOWER EXTREMITY ROM:  Active ROM Right eval Left eval  Hip flexion    Hip extension    Hip abduction    Hip adduction    Hip internal rotation    Hip external rotation    Knee flexion 70 120  Knee extension -10 0  Ankle dorsiflexion 0 5  Ankle plantarflexion    Ankle inversion    Ankle eversion     Passive ROM Right eval Left eval  Hip flexion    Hip extension    Hip abduction    Hip adduction    Hip internal rotation    Hip external rotation    Knee flexion 80   Knee extension -6   Ankle dorsiflexion    Ankle plantarflexion    Ankle inversion    Ankle eversion    (Blank rows = not tested)  LOWER EXTREMITY MMT:  MMT Right eval Left eval  Hip flexion 4- 4+  Hip extension    Hip abduction 4- 4+  Hip adduction    Hip internal rotation 3+ 4  Hip external rotation 4- 5  Knee flexion 4+ 5  Knee extension 4- 5  Ankle  dorsiflexion 4 4  Ankle plantarflexion    Ankle inversion    Ankle eversion     (Blank rows = not tested)   FUNCTIONAL TESTS:  5x STS = 22.41 TUG = 18.39 DGI =  16   GAIT: Distance walked: 200 feet into clinic Assistive device utilized: Walker - 2 wheeled Level of assistance: Complete Independence Gait pattern: decreased stance time- Right and antalgic Comments: ambulates independently with FWW with decreased R knee flexion in swing, decreased stance time RLE with mild limp and decreased WB RLE;   can ambulate with a SPC but more pronounced limp and requires min gait belt assist for safety;   able to ambulate down/up 1 flight steps but has to ambulate 2 feet to same step using BUE on same rail   TODAY'S TREATMENT:  12/08/23 Bike partial ROM L1 x 5' Gait with [facility] with cueing and tactile cues for R knee flexion in swing, proper cane sequence Supine QS x 3/10 Supine SAQ x 3/10 SLR x 2/10 SKTC knee flexion stretch x 1' x 2 RLE  Standing unsupported toe raise, heel raise, minisquat, march BLE x 20 ea with CGA to min assist for balance, L weight shifting for 50/50% Standing calf stretch on foam x 1' x 2 RLE Standing supported hip ABD, hip ext, and knee flexion x 20 RLE Seated LAQ x 20 RLE Step stance rock/lunge F/B on RLE x 20 Standing RTB TLE x 20 RLE  12/06/23 Initial HEP, safety with gait with walker and cane, instruction for ice 3-4 x daily/15-30 min to R knee; keeping R kne straight when lying in bed without pillows; no submerging incision   PATIENT EDUCATION:  Education details: PT eval findings, anticipated POC, initial HEP, transfer safety, and gait safety with spc and fww  Person educated: Patient Education method: Explanation, Demonstration, Verbal cues, Tactile cues, and MedBridgeGO app access provided Education comprehension: verbalized understanding, verbal cues required, tactile cues required, and needs further education  HOME EXERCISE PROGRAM: Access Code:  [email or web address] URL: [email or web address] Date: 12/06/2023 Prepared by: Garnette Montclair  Exercises - Supine Ankle Pumps  - 3 x daily - 7 x weekly - 1 sets - 10 reps - Supine Quad Set  - 3 x daily - 7 x weekly - 1 sets - 10 reps - Straight Leg Raise  - 3 x daily - 7 x weekly - 1 sets - 10 reps - Supine Heel Slides  - 3 x daily - 7 x weekly - 1 sets - 10 reps - Supine Hip Abduction  - 3 x daily - 7 x weekly - 1 sets - 10 reps - Seated Long Arc Quad  - 3 x daily - 7 x weekly - 1 sets - 10 reps - Seated Knee Flexion  - 3 x daily - 7 x weekly - 1 sets - 10 reps - Supine Knee Extension Strengthening  - 1 x daily - 7 x weekly - 3 sets - 10 reps - Standing Heel Raise with Support  - 1 x daily - 7 x weekly - 3 sets - 5 reps - Toe Raises with Counter Support  - 1 x daily - 7 x weekly - 3 sets - 5 reps - Mini Squats at Table  - 1 x daily - 7 x weekly - 3 sets - 5 reps - Standing Hip Flexion AROM  - 1 x daily - 7 x weekly - 3 sets - 5 reps - Standing Hip Abduction  - 1 x daily - 7 x  weekly - 3 sets - 5 reps - Standing Hip Extension  - 1 x daily - 7 x weekly - 3 sets - 5 reps - Standing Knee Flexion AROM with Chair Support  - 1 x daily - 7 x weekly - 3 sets - 5 reps   ASSESSMENT:  CLINICAL IMPRESSION: Patient has weak quad contraction, weak quad set, and quad lag with SLR on the RLE.  Focus of treatment today is on quad activation and good contraction.  ROM today is -8 to 85 degrees.  Edema and heat are moderate in the R knee.  She is progressing.   Encouragement to slow down on her exercises with good quad contraction, good form, slower eccentric phases on all exercises with better control.  Nikoletta Varma is a 58 y.o. female who was referred to physical therapy for evaluation and treatment following a R TKA by Dr Jerri 2 weeks ago.   Patient reports onset of R knee pain beginning at least a year ago. Pain is worse with R knee flexion and extension and with FWB unsupported on RLE.  Patient  has deficits in R knee ROM, R LE flexibility, RLE strength, balance, safety, and HEP understanding which are interfering with ADLs and are impacting quality of life.  On LEFS patient scored 49/80 demonstrating 38.75% functional limitation.  Cl[place]ta will benefit from skilled PT to address above deficits to improve mobility and activity tolerance with decreased pain interference.  OBJECTIVE IMPAIRMENTS: Abnormal gait, decreased balance, decreased knowledge of use of DME, decreased mobility, difficulty walking, decreased ROM, decreased strength, decreased safety awareness, increased edema, increased muscle spasms, impaired flexibility, and pain.   ACTIVITY LIMITATIONS: carrying, lifting, bending, standing, squatting, sleeping, stairs, and locomotion level  PARTICIPATION LIMITATIONS: meal prep, cleaning, laundry, driving, shopping, and community activity  PERSONAL FACTORS: Age, Fitness, and 1-2 comorbidities: Bronchitis, Diabetes, Htn, OA are also affecting patient's functional outcome.   REHAB POTENTIAL: Good  CLINICAL DECISION MAKING: Evolving/moderate complexity  EVALUATION COMPLEXITY: Moderate   GOALS: Goals reviewed with patient? Yes  SHORT TERM GOALS: Target date: 01/03/2024  Patient will be independent with initial HEP. Baseline: 100% PT assist required for correct completion  Goal status: INITIAL  2.  Patient will report at least 25% improvement in R knee pain to improve QOL. Baseline: 6/10 average, 8/10 worst Goal status: INITIAL  LONG TERM GOALS: Target date: 01/31/2024  Patient will be independent with advanced/ongoing HEP to improve outcomes and carryover.  Baseline: no advanced HEP yet Goal status: INITIAL  2.  Patient will report at least 50-75% improvement in R knee pain to improve QOL. Baseline: 6-8/10 Goal status: INITIAL  3.  Patient will demonstrate improved R knee AROM to 0-120 degrees to allow for normal gait and stair mechanics. Baseline: Refer to above LE ROM  table Goal status: INITIAL  4.  Patient will demonstrate improved R knee and RLE strength to >/= 4+/5 for improved stability and ease of mobility. Baseline: Refer to above LE MMT table Goal status: INITIAL  5.  Patient will be able to ambulate x 10-20 minutes with no device, and normal gait pattern without increased pain to access community for shopping Baseline: 200 feet with walker Goal status: INITIAL  6. Patient will be able to ascend/descend stairs with 1 HR and reciprocal step pattern safely to access home and community.  Baseline: both hands required and 2 foot on 1 step advancedment Goal status: INITIAL  7.  Patient will report >/= 60/80 on LEFS (MCID =  9 pts) to demonstrate improved functional ability. Baseline: 49  Goal status: INITIAL  8.  Patient will demonstrate at least 20/24 on DGI to decrease risk of falls. Baseline: 16/20 Goal status: INITIAL     PLAN:  PT FREQUENCY: 2x/week  PT DURATION: 8 weeks  PLANNED INTERVENTIONS: {97164- PT Re-evaluation, 97750- Physical Performance Testing, 97110-Therapeutic exercises, 97530- Therapeutic activity, 97112- Neuromuscular re-education, 97535- Self Care, [id number]- Manual therapy, [phone]- Gait training, [phone]- Electrical stimulation (unattended), 97016- Vasopneumatic device, 20560 (1-2 muscles), 20561 (3+ muscles)- Dry Needling, Patient/Family education, Balance training, Stair training, Taping, Joint mobilization, DME instructions, Cryotherapy, Moist heat, and Biofeedback  PLAN FOR NEXT SESSION:  Quad exercises, quad activation, Knee stretching/ROM; vaso; prone exercise    Ishmeal Rorie, PT 12/08/2023, 10:52 AM

## 2023-12-11 ENCOUNTER — Ambulatory Visit: Admitting: Rehabilitation

## 2023-12-11 ENCOUNTER — Encounter: Payer: Self-pay | Admitting: Rehabilitation

## 2023-12-11 DIAGNOSIS — M25561 Pain in right knee: Secondary | ICD-10-CM | POA: Diagnosis not present

## 2023-12-11 DIAGNOSIS — M25661 Stiffness of right knee, not elsewhere classified: Secondary | ICD-10-CM

## 2023-12-11 DIAGNOSIS — M6281 Muscle weakness (generalized): Secondary | ICD-10-CM

## 2023-12-11 DIAGNOSIS — R262 Difficulty in walking, not elsewhere classified: Secondary | ICD-10-CM

## 2023-12-11 DIAGNOSIS — R600 Localized edema: Secondary | ICD-10-CM

## 2023-12-11 NOTE — Therapy (Signed)
 OUTPATIENT PHYSICAL THERAPY LOWER EXTREMITY EVALUATION   Patient Name: Emmajo Bennette MRN: [id number] DOB: 11-Dec-1965, 58 y.o., female Today's Date: 12/11/2023   END OF SESSION:  PT End of Session - 12/11/23 1029     Visit Number 3    Date for PT Re-Evaluation 01/31/24    Authorization Type [employer] Dual complete    PT Start Time 1020    PT Stop Time 1118    PT Time Calculation (min) 58 min    Activity Tolerance No increased pain;Patient tolerated treatment well    Behavior During Therapy [facility] for tasks assessed/performed            Past Medical History:  Diagnosis Date   Ankle fracture 2016   Right   Aortic atherosclerosis (HCC)    trace calcific atherosclerosis aortic arch per ct neck done 12-22-17   Bronchitis    Diabetes mellitus without complication (HCC)    Hypertension    OA (osteoarthritis) of shoulder    left shoulder, both knees arthritis   Osteoarthritis, knee    Sleep apnea    wears CPAP   Past Surgical History:  Procedure Laterality Date   CHOLECYSTECTOMY N/A 04/15/2023   Procedure: LAPAROSCOPIC CHOLECYSTECTOMY WITH ICG DYE;  Surgeon: Ebbie Cough, MD;  Location: [facility];  Service: General;  Laterality: N/A;   COLONOSCOPY     TOTAL KNEE ARTHROPLASTY Right 11/20/2023   Procedure: ARTHROPLASTY, KNEE, TOTAL RIGHT;  Surgeon: Jerri Kay HERO, MD;  Location: [facility] OR;  Service: Orthopedics;  Laterality: Right;   Patient Active Problem List   Diagnosis Date Noted   Status post total right knee replacement 11/20/2023   Primary osteoarthritis of right knee 09/26/2023   Snoring 06/07/2023   Acute cholecystitis 04/13/2023   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, with long-term current use of insulin  (HCC) 04/25/2022   Amblyopia of eye, right 07/27/2021   Corneal guttata of left eye 07/27/2021   Influenza vaccine refused 07/29/2020   COVID-19 vaccine dose declined 07/29/2020   Vasomotor symptoms  due to menopause 04/24/2020   Atrophic vaginitis 04/24/2020   Impingement syndrome of left shoulder 01/23/2018    PCP: Theotis Haze ORN, NP   REFERRING PROVIDER: Jerri Kay HERO, MD   REFERRING DIAG:  M17.11 (ICD-10-CM) - Primary osteoarthritis of right knee  THERAPY DIAG:  Acute pain of right knee  Stiffness of right knee, not elsewhere classified  Difficulty in walking, not elsewhere classified  Muscle weakness (generalized)  Localized edema  RATIONALE FOR EVALUATION AND TREATMENT: Rehabilitation  ONSET DATE: 11/20/23  NEXT MD VISIT: 01/02/24 Dr Jerri.   SUBJECTIVE:  SUBJECTIVE STATEMENT: Pt. States to a birthday party this weekend, but had to leave early due to R knee hurting and swelling.  Denies any falls or med changes.  Rates pain today 6/10.  Comes to clinic using walker  Patient referred to PT following  R TKA on by DR XU on 11/20/23.   She has had 2 weeks HH PT.     She is also c/o nerve pain in the R hip and anterolateral thigh that has been present since surgery and is attributed to the nerve block.  She had her bandage removed yesterday and has steri strips in place.  There is no redness/drainage/dehischence noted.  Calf edema is minimal.  Homan's is negative RLE.   Patient has end stage L knee OA as well and hopes to the L TKA done before the year is out once she is recovered from this surgery.  She has been walker dependent since surgery, but no falls.  PAIN: Are you having pain? Yes: NPRS scale: 7/10 Pain location: R knee Pain description: dull to sharp variably  Aggravating factors: end ROM flex/ext; prlonged walking Relieving factors: ice, meds, rest, elevation  PERTINENT HISTORY:  Bronchitis, Diabetes, Htn, OA  PRECAUTIONS: None  RED FLAGS: None  WEIGHT  BEARING RESTRICTIONS: No  FALLS:  Has patient fallen in last 6 months? No  LIVING ENVIRONMENT: Lives with: lives with their family Lives in: House/apartment Stairs: Yes: External: 2 steps; none Has following equipment at home: Walker - 2 wheeled, shower chair, and bed side commode  OCCUPATION:  disabled  PLOF: Independentused cane some prior to surgery due to pain, but normally independent with no device  PATIENT GOALS: walk normally without pain and get my other knee done   OBJECTIVE: (objective measures completed at initial evaluation unless otherwise dated)  PATIENT SURVEYS:  LEFS = 49/80  COGNITION: Overall cognitive status: Within functional limits for tasks assessed    SENSATION: WFL  EDEMA:  Circumferential: 42.5 cm on L knee; 44.5 cm on R knee  POSTURE:  No Significant postural limitations  PALPATION: TTP over the medial and lateral R knee, along with R mid to distal IT band, and distal quads  MUSCLE LENGTH: Hamstrings: Right 80 deg; Left 90  deg Hamstrings: 90 degrees + SLR BLE ITB: mild tightness RLE Heelcord: mild tight RLE  LOWER EXTREMITY ROM:  Active ROM Right eval Left eval  Hip flexion    Hip extension    Hip abduction    Hip adduction    Hip internal rotation    Hip external rotation    Knee flexion 70 120  Knee extension -10 0  Ankle dorsiflexion 0 5  Ankle plantarflexion    Ankle inversion    Ankle eversion     Passive ROM Right eval Left eval  Hip flexion    Hip extension    Hip abduction    Hip adduction    Hip internal rotation    Hip external rotation    Knee flexion 80   Knee extension -6   Ankle dorsiflexion    Ankle plantarflexion    Ankle inversion    Ankle eversion    (Blank rows = not tested)  LOWER EXTREMITY MMT:  MMT Right eval Left eval  Hip flexion 4- 4+  Hip extension    Hip abduction 4- 4+  Hip adduction    Hip internal rotation 3+ 4  Hip external rotation 4- 5  Knee flexion 4+ 5  Knee extension 4-  5  Ankle dorsiflexion 4 4  Ankle plantarflexion    Ankle inversion    Ankle eversion     (Blank rows = not tested)   FUNCTIONAL TESTS:  5x STS = 22.41 TUG = 18.39 DGI =  16   GAIT: Distance walked: 200 feet into clinic Assistive device utilized: Walker - 2 wheeled Level of assistance: Complete Independence Gait pattern: decreased stance time- Right and antalgic Comments: ambulates independently with FWW with decreased R knee flexion in swing, decreased stance time RLE with mild limp and decreased WB RLE;   can ambulate with a SPC but more pronounced limp and requires min gait belt assist for safety;   able to ambulate down/up 1 flight steps but has to ambulate 2 feet to same step using BUE on same rail   TODAY'S TREATMENT:  12/11/23 Bike partial ROM L1 x 6'  Seated LAQ 2# x 3/10 Supine SLR 2# x 2/10 Supine SAQ 2# 2/10 QS x 20 HS x 20 SKTC flexion stretch x 1' x 2 RLE Prone knee flexion/curls x 20 RLE Prone knee flexion stretch x 1' x 2 RLE  Standing calf stretch x 1' x 2 RLE Standing toe raises no support x 20 Standing heel raises no support x 20 Minisquats no support x 20 Marching no support x 20 Aerobic step ups x 2/10 forward RTB TKE x 20  Vasopneumatic to R knee at min compression x 15' at 34 degrees.  12/08/23 Bike partial ROM L1 x 5' Gait with SPC with cueing and tactile cues for R knee flexion in swing, proper cane sequence Supine QS x 3/10 Supine SAQ x 3/10 SLR x 2/10 SKTC knee flexion stretch x 1' x 2 RLE   Standing unsupported toe raise, heel raise, minisquat, march BLE x 20 ea with CGA to min assist for balance, L weight shifting for 50/50% Standing calf stretch on foam x 1' x 2 RLE Standing supported hip ABD, hip ext, and knee flexion x 20 RLE Seated LAQ x 20 RLE Step stance rock/lunge F/B on RLE x 20 Standing RTB TKE x 20 RLE  12/06/23 Initial HEP, safety with gait with walker and cane, instruction for ice 3-4 x daily/15-30 min to R knee; keeping  R kne straight when lying in bed without pillows; no submerging incision   PATIENT EDUCATION:  Education details: PT eval findings, anticipated POC, initial HEP, transfer safety, and gait safety with spc and fww  Person educated: Patient Education method: Explanation, Demonstration, Verbal cues, Tactile cues, and MedBridgeGO app access provided Education comprehension: verbalized understanding, verbal cues required, tactile cues required, and needs further education  HOME EXERCISE PROGRAM: Access Code: [email or web address] URL: [email or web address] Date: 12/06/2023 Prepared by: Garnette Montclair  Exercises - Supine Ankle Pumps  - 3 x daily - 7 x weekly - 1 sets - 10 reps - Supine Quad Set  - 3 x daily - 7 x weekly - 1 sets - 10 reps - Straight Leg Raise  - 3 x daily - 7 x weekly - 1 sets - 10 reps - Supine Heel Slides  - 3 x daily - 7 x weekly - 1 sets - 10 reps - Supine Hip Abduction  - 3 x daily - 7 x weekly - 1 sets - 10 reps - Seated Long Arc Quad  - 3 x daily - 7 x weekly - 1 sets - 10 reps - Seated Knee Flexion  - 3 x daily - 7 x weekly - 1 sets -  10 reps - Supine Knee Extension Strengthening  - 1 x daily - 7 x weekly - 3 sets - 10 reps - Standing Heel Raise with Support  - 1 x daily - 7 x weekly - 3 sets - 5 reps - Toe Raises with Counter Support  - 1 x daily - 7 x weekly - 3 sets - 5 reps - Mini Squats at Table  - 1 x daily - 7 x weekly - 3 sets - 5 reps - Standing Hip Flexion AROM  - 1 x daily - 7 x weekly - 3 sets - 5 reps - Standing Hip Abduction  - 1 x daily - 7 x weekly - 3 sets - 5 reps - Standing Hip Extension  - 1 x daily - 7 x weekly - 3 sets - 5 reps - Standing Knee Flexion AROM with Chair Support  - 1 x daily - 7 x weekly - 3 sets - 5 reps   ASSESSMENT:  CLINICAL IMPRESSION: Quad activation slowly improving.  Focus on this today as well as knee flexion ROM.   Reviewed all HEP and patient should be doing this 2x daily.  This will need to reiterated each visit.   Patient still lacking ROM, strength with quad lag present with SLR and knee extension exercises.   Needs encouragement to use cane in community.    EVAL:  Keysha Damewood is a 58 y.o. female who was referred to physical therapy for evaluation and treatment following a R TKA by Dr Jerri 2 weeks ago.   Patient reports onset of R knee pain beginning at least a year ago. Pain is worse with R knee flexion and extension and with FWB unsupported on RLE.  Patient has deficits in R knee ROM, R LE flexibility, RLE strength, balance, safety, and HEP understanding which are interfering with ADLs and are impacting quality of life.  On LEFS patient scored 49/80 demonstrating 38.75% functional limitation.  [place] will benefit from skilled PT to address above deficits to improve mobility and activity tolerance with decreased pain interference.  OBJECTIVE IMPAIRMENTS: Abnormal gait, decreased balance, decreased knowledge of use of DME, decreased mobility, difficulty walking, decreased ROM, decreased strength, decreased safety awareness, increased edema, increased muscle spasms, impaired flexibility, and pain.   ACTIVITY LIMITATIONS: carrying, lifting, bending, standing, squatting, sleeping, stairs, and locomotion level  PARTICIPATION LIMITATIONS: meal prep, cleaning, laundry, driving, shopping, and community activity  PERSONAL FACTORS: Age, Fitness, and 1-2 comorbidities: Bronchitis, Diabetes, Htn, OA are also affecting patient's functional outcome.   REHAB POTENTIAL: Good  CLINICAL DECISION MAKING: Evolving/moderate complexity  EVALUATION COMPLEXITY: Moderate   GOALS: Goals reviewed with patient? Yes  SHORT TERM GOALS: Target date: 01/03/2024  Patient will be independent with initial HEP. Baseline: 100% PT assist required for correct completion  Goal status: INITIAL  2.  Patient will report at least 25% improvement in R knee pain to improve QOL. Baseline: 6/10 average, 8/10 worst Goal status:  INITIAL  LONG TERM GOALS: Target date: 01/31/2024  Patient will be independent with advanced/ongoing HEP to improve outcomes and carryover.  Baseline: no advanced HEP yet Goal status: INITIAL  2.  Patient will report at least 50-75% improvement in R knee pain to improve QOL. Baseline: 6-8/10 Goal status: INITIAL  3.  Patient will demonstrate improved R knee AROM to 0-120 degrees to allow for normal gait and stair mechanics. Baseline: Refer to above LE ROM table Goal status: INITIAL  4.  Patient will demonstrate improved R knee  and RLE strength to >/= 4+/5 for improved stability and ease of mobility. Baseline: Refer to above LE MMT table Goal status: INITIAL  5.  Patient will be able to ambulate x 10-20 minutes with no device, and normal gait pattern without increased pain to access community for shopping Baseline: 200 feet with walker Goal status: INITIAL  6. Patient will be able to ascend/descend stairs with 1 HR and reciprocal step pattern safely to access home and community.  Baseline: both hands required and 2 foot on 1 step advancedment Goal status: INITIAL  7.  Patient will report >/= 60/80 on LEFS (MCID = 9 pts) to demonstrate improved functional ability. Baseline: 49  Goal status: INITIAL  8.  Patient will demonstrate at least 20/24 on DGI to decrease risk of falls. Baseline: 16/20 Goal status: INITIAL     PLAN:  PT FREQUENCY: 2x/week  PT DURATION: 8 weeks  PLANNED INTERVENTIONS: {97164- PT Re-evaluation, 97750- Physical Performance Testing, 97110-Therapeutic exercises, 97530- Therapeutic activity, 97112- Neuromuscular re-education, 97535- Self Care, [id number]- Manual therapy, [phone]- Gait training, [phone]- Electrical stimulation (unattended), 97016- Vasopneumatic device, 20560 (1-2 muscles), 20561 (3+ muscles)- Dry Needling, Patient/Family education, Balance training, Stair training, Taping, Joint mobilization, DME instructions, Cryotherapy, Moist heat, and  Biofeedback  PLAN FOR NEXT SESSION:  Try PNF flexion and extension stretching in prone; continue with balance progression with cane; Continue with VASO   Dailan Pfalzgraf, PT 12/11/2023, 10:15 PM

## 2023-12-13 ENCOUNTER — Ambulatory Visit: Admitting: Rehabilitation

## 2023-12-13 DIAGNOSIS — M25661 Stiffness of right knee, not elsewhere classified: Secondary | ICD-10-CM

## 2023-12-13 DIAGNOSIS — M6281 Muscle weakness (generalized): Secondary | ICD-10-CM

## 2023-12-13 DIAGNOSIS — R600 Localized edema: Secondary | ICD-10-CM

## 2023-12-13 DIAGNOSIS — M25561 Pain in right knee: Secondary | ICD-10-CM | POA: Diagnosis not present

## 2023-12-13 DIAGNOSIS — R262 Difficulty in walking, not elsewhere classified: Secondary | ICD-10-CM

## 2023-12-13 NOTE — Therapy (Signed)
 OUTPATIENT PHYSICAL THERAPY LOWER EXTREMITY EVALUATION   Patient Name: Ann Foster MRN: [id number] DOB: [birth year], 58 y.o., female Today's Date: 12/13/2023   END OF SESSION:  PT End of Session - 12/13/23 1024     Visit Number 4    Date for PT Re-Evaluation 01/31/24    Authorization Type [employer] Dual complete    PT Start Time 1020    PT Stop Time 1120    PT Time Calculation (min) 60 min    Activity Tolerance No increased pain;Patient tolerated treatment well    Behavior During Therapy [facility] for tasks assessed/performed             Past Medical History:  Diagnosis Date   Ankle fracture 2016   Right   Aortic atherosclerosis (HCC)    trace calcific atherosclerosis aortic arch per ct neck done 12-22-17   Bronchitis    Diabetes mellitus without complication (HCC)    Hypertension    OA (osteoarthritis) of shoulder    left shoulder, both knees arthritis   Osteoarthritis, knee    Sleep apnea    wears CPAP   Past Surgical History:  Procedure Laterality Date   CHOLECYSTECTOMY N/A 04/15/2023   Procedure: LAPAROSCOPIC CHOLECYSTECTOMY WITH ICG DYE;  Surgeon: Ebbie Cough, MD;  Location: [facility];  Service: General;  Laterality: N/A;   COLONOSCOPY     TOTAL KNEE ARTHROPLASTY Right 11/20/2023   Procedure: ARTHROPLASTY, KNEE, TOTAL RIGHT;  Surgeon: Jerri Kay HERO, MD;  Location: [facility];  Service: Orthopedics;  Laterality: Right;   Patient Active Problem List   Diagnosis Date Noted   Status post total right knee replacement 11/20/2023   Primary osteoarthritis of right knee 09/26/2023   Snoring 06/07/2023   Acute cholecystitis 04/13/2023   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, with long-term current use of insulin  (HCC) 04/25/2022   Amblyopia of eye, right 07/27/2021   Corneal guttata of left eye 07/27/2021   Influenza vaccine refused 07/29/2020   COVID-19 vaccine dose declined 07/29/2020   Vasomotor symptoms  due to menopause 04/24/2020   Atrophic vaginitis 04/24/2020   Impingement syndrome of left shoulder 01/23/2018    PCP: Theotis Haze ORN, NP   REFERRING PROVIDER: Jerri Kay HERO, MD   REFERRING DIAG:  M17.11 (ICD-10-CM) - Primary osteoarthritis of right knee  THERAPY DIAG:  Acute pain of right knee  Stiffness of right knee, not elsewhere classified  Difficulty in walking, not elsewhere classified  Muscle weakness (generalized)  Localized edema  RATIONALE FOR EVALUATION AND TREATMENT: Rehabilitation  ONSET DATE: 11/20/23  NEXT MD VISIT: 01/02/24 Dr Jerri.   SUBJECTIVE:  SUBJECTIVE STATEMENT: Pt. States has been very sore since adding SKTC flexion stretching into her HEP.  However, she is working hard on all of her exercise.  States she has a Electrical engineer bike from [place] now and is riding this BID x 5 min, but can't yet get full revolutions.    Patient referred to PT following  R TKA on by DR XU on 11/20/23.   She has had 2 weeks HH PT.     She is also c/o nerve pain in the R hip and anterolateral thigh that has been present since surgery and is attributed to the nerve block.  She had her bandage removed yesterday and has steri strips in place.  There is no redness/drainage/dehischence noted.  Calf edema is minimal.  Homan's is negative RLE.   Patient has end stage L knee OA as well and hopes to the L TKA done before the year is out once she is recovered from this surgery.  She has been walker dependent since surgery, but no falls.  PAIN: Are you having pain? Yes: NPRS scale: 7/10 Pain location: R knee Pain description: dull to sharp variably  Aggravating factors: end ROM flex/ext; prlonged walking Relieving factors: ice, meds, rest, elevation  PERTINENT HISTORY:  Bronchitis,  Diabetes, Htn, OA  PRECAUTIONS: None  RED FLAGS: None  WEIGHT BEARING RESTRICTIONS: No  FALLS:  Has patient fallen in last 6 months? No  LIVING ENVIRONMENT: Lives with: lives with their family Lives in: House/apartment Stairs: Yes: External: 2 steps; none Has following equipment at home: Walker - 2 wheeled, shower chair, and bed side commode  OCCUPATION:  disabled  PLOF: Independentused cane some prior to surgery due to pain, but normally independent with no device  PATIENT GOALS: walk normally without pain and get my other knee done   OBJECTIVE: (objective measures completed at initial evaluation unless otherwise dated)  PATIENT SURVEYS:  LEFS = 49/80  COGNITION: Overall cognitive status: Within functional limits for tasks assessed    SENSATION: WFL  EDEMA:  Circumferential: 42.5 cm on L knee; 44.5 cm on R knee  POSTURE:  No Significant postural limitations  PALPATION: TTP over the medial and lateral R knee, along with R mid to distal IT band, and distal quads  MUSCLE LENGTH: Hamstrings: Right 80 deg; Left 90  deg Hamstrings: 90 degrees + SLR BLE ITB: mild tightness RLE Heelcord: mild tight RLE  LOWER EXTREMITY ROM:  Active ROM Right eval Left eval  Hip flexion    Hip extension    Hip abduction    Hip adduction    Hip internal rotation    Hip external rotation    Knee flexion 70 120  Knee extension -10 0  Ankle dorsiflexion 0 5  Ankle plantarflexion    Ankle inversion    Ankle eversion     Passive ROM Right eval Left eval  Hip flexion    Hip extension    Hip abduction    Hip adduction    Hip internal rotation    Hip external rotation    Knee flexion 80   Knee extension -6   Ankle dorsiflexion    Ankle plantarflexion    Ankle inversion    Ankle eversion    (Blank rows = not tested)  LOWER EXTREMITY MMT:  MMT Right eval Left eval  Hip flexion 4- 4+  Hip extension    Hip abduction 4- 4+  Hip adduction    Hip internal rotation 3+ 4  Hip external rotation 4- 5  Knee flexion 4+ 5  Knee extension 4- 5  Ankle dorsiflexion 4 4  Ankle plantarflexion    Ankle inversion    Ankle eversion     (Blank rows = not tested)   FUNCTIONAL TESTS:  5x STS = 22.41 TUG = 18.39 DGI =  16   GAIT: Distance walked: 200 feet into clinic Assistive device utilized: Walker - 2 wheeled Level of assistance: Complete Independence Gait pattern: decreased stance time- Right and antalgic Comments: ambulates independently with FWW with decreased R knee flexion in swing, decreased stance time RLE with mild limp and decreased WB RLE;   can ambulate with a SPC but more pronounced limp and requires min gait belt assist for safety;   able to ambulate down/up 1 flight steps but has to ambulate 2 feet to same step using BUE on same rail   TODAY'S TREATMENT:  12/13/23 THERAPEUTIC EXERCISE: To improve ROM.  Demonstration, verbal and tactile cues throughout for technique. Bike partial ROM x 8' no tension  THERAPEUTIC ACTIVITIES: To improve functional performance.  Demonstration, verbal and tactile cues throughout for technique. Aerobic step calf stretch x 1' x 2  Aerobic step forward lunge in step stance x 20 Step ups forward x 10 RLE Step ups lateral x 2/10 RLE Seated LAQ x 20 w/ 2 sec holds x 20 RLE Seated sit n slide knee flexion sttretch x 1' x 2 RLE Prone reverse QS/knee extension x 20 Prone knee curls x 20 RLE Prone knee flexion PNF with CR and active flexion with PT assist SKTC flexion stretch x 1' x 2 RLE  MANUAL THERAPY: To promote improved flexibility, increased ROM, and reduced pain utilizing myofascial release and scar mobilization MFR to in SL to R IT band x 8' R IT band rolling x 5' Scar massage cross friction and j-hooks x 2'  Vasopneumatic to R knee at min compression x 15' at 34 degrees.    12/11/23 Bike partial ROM L1 x 6'  Seated LAQ 2# x 3/10 Supine SLR 2# x 2/10 Supine SAQ 2# 2/10 QS x 20 HS x 20 SKTC flexion  stretch x 1' x 2 RLE Prone knee flexion/curls x 20 RLE Prone knee flexion stretch x 1' x 2 RLE  Standing calf stretch x 1' x 2 RLE Standing toe raises no support x 20 Standing heel raises no support x 20 Minisquats no support x 20 Marching no support x 20 Aerobic step ups x 2/10 forward RTB TKE x 20  Vasopneumatic to R knee at min compression x 15' at 34 degrees.  12/08/23 Bike partial ROM L1 x 5' Gait with SPC with cueing and tactile cues for R knee flexion in swing, proper cane sequence Supine QS x 3/10 Supine SAQ x 3/10 SLR x 2/10 SKTC knee flexion stretch x 1' x 2 RLE   Standing unsupported toe raise, heel raise, minisquat, march BLE x 20 ea with CGA to min assist for balance, L weight shifting for 50/50% Standing calf stretch on foam x 1' x 2 RLE Standing supported hip ABD, hip ext, and knee flexion x 20 RLE Seated LAQ x 20 RLE Step stance rock/lunge F/B on RLE x 20 Standing RTB TKE x 20 RLE  12/06/23 Initial HEP, safety with gait with walker and cane, instruction for ice 3-4 x daily/15-30 min to R knee; keeping R kne straight when lying in bed without pillows; no submerging incision   PATIENT EDUCATION:  Education details: PT  eval findings, anticipated POC, initial HEP, transfer safety, and gait safety with spc and fww  Person educated: Patient Education method: Explanation, Demonstration, Verbal cues, Tactile cues, and MedBridgeGO app access provided Education comprehension: verbalized understanding, verbal cues required, tactile cues required, and needs further education  HOME EXERCISE PROGRAM: Access Code: [email or web address] URL: [email or web address] Date: 12/06/2023 Prepared by: Garnette Montclair  Exercises - Supine Ankle Pumps  - 3 x daily - 7 x weekly - 1 sets - 10 reps - Supine Quad Set  - 3 x daily - 7 x weekly - 1 sets - 10 reps - Straight Leg Raise  - 3 x daily - 7 x weekly - 1 sets - 10 reps - Supine Heel Slides  - 3 x daily - 7 x weekly - 1 sets -  10 reps - Supine Hip Abduction  - 3 x daily - 7 x weekly - 1 sets - 10 reps - Seated Long Arc Quad  - 3 x daily - 7 x weekly - 1 sets - 10 reps - Seated Knee Flexion  - 3 x daily - 7 x weekly - 1 sets - 10 reps - Supine Knee Extension Strengthening  - 1 x daily - 7 x weekly - 3 sets - 10 reps - Standing Heel Raise with Support  - 1 x daily - 7 x weekly - 3 sets - 5 reps - Toe Raises with Counter Support  - 1 x daily - 7 x weekly - 3 sets - 5 reps - Mini Squats at Table  - 1 x daily - 7 x weekly - 3 sets - 5 reps - Standing Hip Flexion AROM  - 1 x daily - 7 x weekly - 3 sets - 5 reps - Standing Hip Abduction  - 1 x daily - 7 x weekly - 3 sets - 5 reps - Standing Hip Extension  - 1 x daily - 7 x weekly - 3 sets - 5 reps - Standing Knee Flexion AROM with Chair Support  - 1 x daily - 7 x weekly - 3 sets - 5 reps   ASSESSMENT:  CLINICAL IMPRESSION: Patient comes to clinic today ambulating with her [facility] for the first time which demonstrates good progress with balance in the community.  She has -3 to 90 degrees R knee ROM today which is 10 degrees improvement since eval visit.  She is progressing to goals.    EVAL:  Erryn Dickison is a 58 y.o. female who was referred to physical therapy for evaluation and treatment following a R TKA by Dr Jerri 2 weeks ago.   Patient reports onset of R knee pain beginning at least a year ago. Pain is worse with R knee flexion and extension and with FWB unsupported on RLE.  Patient has deficits in R knee ROM, R LE flexibility, RLE strength, balance, safety, and HEP understanding which are interfering with ADLs and are impacting quality of life.  On LEFS patient scored 49/80 demonstrating 38.75% functional limitation.  Gabbie will benefit from skilled PT to address above deficits to improve mobility and activity tolerance with decreased pain interference.  OBJECTIVE IMPAIRMENTS: Abnormal gait, decreased balance, decreased knowledge of use of DME, decreased mobility,  difficulty walking, decreased ROM, decreased strength, decreased safety awareness, increased edema, increased muscle spasms, impaired flexibility, and pain.   ACTIVITY LIMITATIONS: carrying, lifting, bending, standing, squatting, sleeping, stairs, and locomotion level  PARTICIPATION LIMITATIONS: meal prep, cleaning, laundry, driving, shopping, and community activity  PERSONAL FACTORS:  Age, Fitness, and 1-2 comorbidities: Bronchitis, Diabetes, Htn, OA are also affecting patient's functional outcome.   REHAB POTENTIAL: Good  CLINICAL DECISION MAKING: Evolving/moderate complexity  EVALUATION COMPLEXITY: Moderate   GOALS: Goals reviewed with patient? Yes  SHORT TERM GOALS: Target date: 01/03/2024  Patient will be independent with initial HEP. Baseline: 100% PT assist required for correct completion  Goal status: INITIAL  2.  Patient will report at least 25% improvement in R knee pain to improve QOL. Baseline: 6/10 average, 8/10 worst Goal status: INITIAL  LONG TERM GOALS: Target date: 01/31/2024  Patient will be independent with advanced/ongoing HEP to improve outcomes and carryover.  Baseline: no advanced HEP yet Goal status: INITIAL  2.  Patient will report at least 50-75% improvement in R knee pain to improve QOL. Baseline: 6-8/10 Goal status: INITIAL  3.  Patient will demonstrate improved R knee AROM to 0-120 degrees to allow for normal gait and stair mechanics. Baseline: Refer to above LE ROM table Goal status: INITIAL  4.  Patient will demonstrate improved R knee and RLE strength to >/= 4+/5 for improved stability and ease of mobility. Baseline: Refer to above LE MMT table Goal status: INITIAL  5.  Patient will be able to ambulate x 10-20 minutes with no device, and normal gait pattern without increased pain to access community for shopping Baseline: 200 feet with walker Goal status: INITIAL  6. Patient will be able to ascend/descend stairs with 1 HR and reciprocal  step pattern safely to access home and community.  Baseline: both hands required and 2 foot on 1 step advancedment Goal status: INITIAL  7.  Patient will report >/= 60/80 on LEFS (MCID = 9 pts) to demonstrate improved functional ability. Baseline: 49  Goal status: INITIAL  8.  Patient will demonstrate at least 20/24 on DGI to decrease risk of falls. Baseline: 16/20 Goal status: INITIAL     PLAN:  PT FREQUENCY: 2x/week  PT DURATION: 8 weeks  PLANNED INTERVENTIONS: {97164- PT Re-evaluation, 97750- Physical Performance Testing, 97110-Therapeutic exercises, 97530- Therapeutic activity, 97112- Neuromuscular re-education, 97535- Self Care, [id number]- Manual therapy, [phone]- Gait training, [phone]- Electrical stimulation (unattended), 97016- Vasopneumatic device, 20560 (1-2 muscles), 20561 (3+ muscles)- Dry Needling, Patient/Family education, Balance training, Stair training, Taping, Joint mobilization, DME instructions, Cryotherapy, Moist heat, and Biofeedback  PLAN FOR NEXT SESSION:  Continue with PNF stretching into flexion, prone with strap; dynamic balance activity additions  Thalya Fouche, PT 12/13/2023, 1:16 PM

## 2023-12-15 ENCOUNTER — Telehealth: Payer: Self-pay | Admitting: Nurse Practitioner

## 2023-12-15 ENCOUNTER — Ambulatory Visit: Admitting: Rehabilitation

## 2023-12-15 DIAGNOSIS — R262 Difficulty in walking, not elsewhere classified: Secondary | ICD-10-CM

## 2023-12-15 DIAGNOSIS — M25561 Pain in right knee: Secondary | ICD-10-CM

## 2023-12-15 DIAGNOSIS — R600 Localized edema: Secondary | ICD-10-CM

## 2023-12-15 DIAGNOSIS — M25661 Stiffness of right knee, not elsewhere classified: Secondary | ICD-10-CM

## 2023-12-15 DIAGNOSIS — M6281 Muscle weakness (generalized): Secondary | ICD-10-CM

## 2023-12-15 DIAGNOSIS — G4733 Obstructive sleep apnea (adult) (pediatric): Secondary | ICD-10-CM | POA: Diagnosis not present

## 2023-12-15 NOTE — Telephone Encounter (Signed)
Contacted pt confirmed appt

## 2023-12-15 NOTE — Therapy (Signed)
 OUTPATIENT PHYSICAL THERAPY LOWER EXTREMITY EVALUATION   Patient Name: Patsy Zaragoza MRN: [id number] DOB: 1965-07-09, 58 y.o., female Today's Date: 12/15/2023   END OF SESSION:  PT End of Session - 12/15/23 1021     Visit Number 5    Date for PT Re-Evaluation 01/31/24    Authorization Type [employer] Dual complete    PT Start Time 1019    PT Stop Time 1115    PT Time Calculation (min) 56 min    Activity Tolerance No increased pain;Patient tolerated treatment well    Behavior During Therapy [facility] for tasks assessed/performed             Past Medical History:  Diagnosis Date   Ankle fracture 2016   Right   Aortic atherosclerosis (HCC)    trace calcific atherosclerosis aortic arch per ct neck done 12-22-17   Bronchitis    Diabetes mellitus without complication (HCC)    Hypertension    OA (osteoarthritis) of shoulder    left shoulder, both knees arthritis   Osteoarthritis, knee    Sleep apnea    wears CPAP   Past Surgical History:  Procedure Laterality Date   CHOLECYSTECTOMY N/A 04/15/2023   Procedure: LAPAROSCOPIC CHOLECYSTECTOMY WITH ICG DYE;  Surgeon: Ebbie Cough, MD;  Location: [facility];  Service: General;  Laterality: N/A;   COLONOSCOPY     TOTAL KNEE ARTHROPLASTY Right 11/20/2023   Procedure: ARTHROPLASTY, KNEE, TOTAL RIGHT;  Surgeon: Jerri Kay HERO, MD;  Location: [facility];  Service: Orthopedics;  Laterality: Right;   Patient Active Problem List   Diagnosis Date Noted   Status post total right knee replacement 11/20/2023   Primary osteoarthritis of right knee 09/26/2023   Snoring 06/07/2023   Acute cholecystitis 04/13/2023   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, with long-term current use of insulin  (HCC) 04/25/2022   Amblyopia of eye, right 07/27/2021   Corneal guttata of left eye 07/27/2021   Influenza vaccine refused 07/29/2020   COVID-19 vaccine dose declined 07/29/2020   Vasomotor symptoms  due to menopause 04/24/2020   Atrophic vaginitis 04/24/2020   Impingement syndrome of left shoulder 01/23/2018    PCP: Theotis Haze ORN, NP   REFERRING PROVIDER: Jerri Kay HERO, MD   REFERRING DIAG:  M17.11 (ICD-10-CM) - Primary osteoarthritis of right knee  THERAPY DIAG:  Acute pain of right knee  Stiffness of right knee, not elsewhere classified  Difficulty in walking, not elsewhere classified  Muscle weakness (generalized)  Localized edema  RATIONALE FOR EVALUATION AND TREATMENT: Rehabilitation  ONSET DATE: 11/20/23  NEXT MD VISIT: 01/02/24 Dr Jerri.   SUBJECTIVE:  SUBJECTIVE STATEMENT: Pt. States has been very sore since adding SKTC flexion stretching into her HEP.  However, she is working hard on all of her exercise.  States she has a Electrical engineer bike from [place] now and is riding this BID x 5 min, but can't yet get full revolutions.    Patient referred to PT following  R TKA on by DR XU on 11/20/23.   She has had 2 weeks HH PT.     She is also c/o nerve pain in the R hip and anterolateral thigh that has been present since surgery and is attributed to the nerve block.  She had her bandage removed yesterday and has steri strips in place.  There is no redness/drainage/dehischence noted.  Calf edema is minimal.  Homan's is negative RLE.   Patient has end stage L knee OA as well and hopes to the L TKA done before the year is out once she is recovered from this surgery.  She has been walker dependent since surgery, but no falls.  PAIN: Are you having pain? Yes: NPRS scale: 7/10 Pain location: R knee Pain description: dull to sharp variably  Aggravating factors: end ROM flex/ext; prlonged walking Relieving factors: ice, meds, rest, elevation  PERTINENT HISTORY:  Bronchitis,  Diabetes, Htn, OA  PRECAUTIONS: None  RED FLAGS: None  WEIGHT BEARING RESTRICTIONS: No  FALLS:  Has patient fallen in last 6 months? No  LIVING ENVIRONMENT: Lives with: lives with their family Lives in: House/apartment Stairs: Yes: External: 2 steps; none Has following equipment at home: Walker - 2 wheeled, shower chair, and bed side commode  OCCUPATION:  disabled  PLOF: Independentused cane some prior to surgery due to pain, but normally independent with no device  PATIENT GOALS: walk normally without pain and get my other knee done   OBJECTIVE: (objective measures completed at initial evaluation unless otherwise dated)  PATIENT SURVEYS:  LEFS = 49/80  COGNITION: Overall cognitive status: Within functional limits for tasks assessed    SENSATION: WFL  EDEMA:  Circumferential: 42.5 cm on L knee; 44.5 cm on R knee 7/18: LLE =  43.5; RLE = 45 cm  POSTURE:  No Significant postural limitations  PALPATION: TTP over the medial and lateral R knee, along with R mid to distal IT band, and distal quads  MUSCLE LENGTH: Hamstrings: Right 80 deg; Left 90  deg Hamstrings: 90 degrees + SLR BLE ITB: mild tightness RLE Heelcord: mild tight RLE  LOWER EXTREMITY ROM:  Active ROM Right eval Left eval  Hip flexion    Hip extension    Hip abduction    Hip adduction    Hip internal rotation    Hip external rotation    Knee flexion 70 120  Knee extension -10 0  Ankle dorsiflexion 0 5  Ankle plantarflexion    Ankle inversion    Ankle eversion     Passive ROM Right eval Left eval  Hip flexion    Hip extension    Hip abduction    Hip adduction    Hip internal rotation    Hip external rotation    Knee flexion 80   Knee extension -6   Ankle dorsiflexion    Ankle plantarflexion    Ankle inversion    Ankle eversion    (Blank rows = not tested)  LOWER EXTREMITY MMT:  MMT Right eval Left eval  Hip flexion 4- 4+  Hip extension    Hip abduction 4- 4+  Hip  adduction  Hip internal rotation 3+ 4  Hip external rotation 4- 5  Knee flexion 4+ 5  Knee extension 4- 5  Ankle dorsiflexion 4 4  Ankle plantarflexion    Ankle inversion    Ankle eversion     (Blank rows = not tested)   FUNCTIONAL TESTS:  5x STS = 22.41 TUG = 18.39 DGI =  16   GAIT: Distance walked: 200 feet into clinic Assistive device utilized: Walker - 2 wheeled Level of assistance: Complete Independence Gait pattern: decreased stance time- Right and antalgic Comments: ambulates independently with FWW with decreased R knee flexion in swing, decreased stance time RLE with mild limp and decreased WB RLE;   can ambulate with a SPC but more pronounced limp and requires min gait belt assist for safety;   able to ambulate down/up 1 flight steps but has to ambulate 2 feet to same step using BUE on same rail   TODAY'S TREATMENT:  12/15/23 THERAPEUTIC EXERCISE: To improve ROM.  Demonstration, verbal and tactile cues throughout for technique. Bike partial ROM x 8'  THERAPEUTIC ACTIVITIES: To improve functional performance.  Demonstration, verbal and tactile cues throughout for technique. Aerobic step calf stretch x 1' x 2 Aerobic step lunges x 20 Step ups F x 10; lateral x 10 RLE Standing:  toe to heel rocking no UE support x 20 Minisquats x 20  Marching 3# x 20 Hip abd 3# x 20 Hip ext 3# x 20 Knee flexion 3# x 20 LAQ 3# X 20 Prone knee extension 3# x 20 Prone knee flexion 0# x 20 Prone PNF flexion stretching contract-relax-contract for knee flexion x 4' SKTC flexion stretching with PNF C-R-C x 4'  MANUAL THERAPY: To promote improved flexibility, increased ROM, and reduced pain utilizing myofascial release and scar mobilization MFR to in SL to R IT band x 5' Scar massage cross friction and j-hooks x 2'  Vasopneumatic to R knee at min compression x 15' at 34 degrees.   12/13/23 THERAPEUTIC EXERCISE: To improve ROM.  Demonstration, verbal and tactile cues throughout for  technique. Bike partial ROM x 8' no tension  THERAPEUTIC ACTIVITIES: To improve functional performance.  Demonstration, verbal and tactile cues throughout for technique. Aerobic step calf stretch x 1' x 2  Aerobic step forward lunge in step stance x 20 Step ups forward x 10 RLE Step ups lateral x 2/10 RLE Seated LAQ x 20 w/ 2 sec holds x 20 RLE Seated sit n slide knee flexion sttretch x 1' x 2 RLE Prone reverse QS/knee extension x 20 Prone knee curls x 20 RLE Prone knee flexion PNF with CR and active flexion with PT assist SKTC flexion stretch x 1' x 2 RLE     12/11/23 Bike partial ROM L1 x 6'  Seated LAQ 2# x 3/10 Supine SLR 2# x 2/10 Supine SAQ 2# 2/10 QS x 20 HS x 20 SKTC flexion stretch x 1' x 2 RLE Prone knee flexion/curls x 20 RLE Prone knee flexion stretch x 1' x 2 RLE  Standing calf stretch x 1' x 2 RLE Standing toe raises no support x 20 Standing heel raises no support x 20 Minisquats no support x 20 Marching no support x 20 Aerobic step ups x 2/10 forward RTB TKE x 20  Vasopneumatic to R knee at min compression x 15' at 34 degrees.  12/08/23 Bike partial ROM L1 x 5' Gait with SPC with cueing and tactile cues for R knee flexion in swing, proper cane sequence  Supine QS x 3/10 Supine SAQ x 3/10 SLR x 2/10 SKTC knee flexion stretch x 1' x 2 RLE   Standing unsupported toe raise, heel raise, minisquat, march BLE x 20 ea with CGA to min assist for balance, L weight shifting for 50/50% Standing calf stretch on foam x 1' x 2 RLE Standing supported hip ABD, hip ext, and knee flexion x 20 RLE Seated LAQ x 20 RLE Step stance rock/lunge F/B on RLE x 20 Standing RTB TKE x 20 RLE  12/06/23 Initial HEP, safety with gait with walker and cane, instruction for ice 3-4 x daily/15-30 min to R knee; keeping R kne straight when lying in bed without pillows; no submerging incision   PATIENT EDUCATION:  Education details: PT eval findings, anticipated POC, initial HEP,  transfer safety, and gait safety with spc and fww  Person educated: Patient Education method: Explanation, Demonstration, Verbal cues, Tactile cues, and MedBridgeGO app access provided Education comprehension: verbalized understanding, verbal cues required, tactile cues required, and needs further education  HOME EXERCISE PROGRAM: Access Code: [email or web address] URL: [email or web address] Date: 12/06/2023 Prepared by: Garnette Montclair  Exercises - Supine Ankle Pumps  - 3 x daily - 7 x weekly - 1 sets - 10 reps - Supine Quad Set  - 3 x daily - 7 x weekly - 1 sets - 10 reps - Straight Leg Raise  - 3 x daily - 7 x weekly - 1 sets - 10 reps - Supine Heel Slides  - 3 x daily - 7 x weekly - 1 sets - 10 reps - Supine Hip Abduction  - 3 x daily - 7 x weekly - 1 sets - 10 reps - Seated Long Arc Quad  - 3 x daily - 7 x weekly - 1 sets - 10 reps - Seated Knee Flexion  - 3 x daily - 7 x weekly - 1 sets - 10 reps - Supine Knee Extension Strengthening  - 1 x daily - 7 x weekly - 3 sets - 10 reps - Standing Heel Raise with Support  - 1 x daily - 7 x weekly - 3 sets - 5 reps - Toe Raises with Counter Support  - 1 x daily - 7 x weekly - 3 sets - 5 reps - Mini Squats at Table  - 1 x daily - 7 x weekly - 3 sets - 5 reps - Standing Hip Flexion AROM  - 1 x daily - 7 x weekly - 3 sets - 5 reps - Standing Hip Abduction  - 1 x daily - 7 x weekly - 3 sets - 5 reps - Standing Hip Extension  - 1 x daily - 7 x weekly - 3 sets - 5 reps - Standing Knee Flexion AROM with Chair Support  - 1 x daily - 7 x weekly - 3 sets - 5 reps   ASSESSMENT:  CLINICAL IMPRESSION: 7/18: Knee flexion today is 90 degrees with firm end feel.  Continuing to work on contract relax PNF to improve knee flexion.  Patient is encouraged to work with her bike at home to get more ROM and with prone stretching  EVAL:  Saragrace Selke is a 58 y.o. female who was referred to physical therapy for evaluation and treatment following a R TKA  by Dr Jerri 2 weeks ago.   Patient reports onset of R knee pain beginning at least a year ago. Pain is worse with R knee flexion and extension and with FWB unsupported on RLE.  Patient has deficits in R knee ROM, R LE flexibility, RLE strength, balance, safety, and HEP understanding which are interfering with ADLs and are impacting quality of life.  On LEFS patient scored 49/80 demonstrating 38.75% functional limitation.  Kegan will benefit from skilled PT to address above deficits to improve mobility and activity tolerance with decreased pain interference.  OBJECTIVE IMPAIRMENTS: Abnormal gait, decreased balance, decreased knowledge of use of DME, decreased mobility, difficulty walking, decreased ROM, decreased strength, decreased safety awareness, increased edema, increased muscle spasms, impaired flexibility, and pain.   ACTIVITY LIMITATIONS: carrying, lifting, bending, standing, squatting, sleeping, stairs, and locomotion level  PARTICIPATION LIMITATIONS: meal prep, cleaning, laundry, driving, shopping, and community activity  PERSONAL FACTORS: Age, Fitness, and 1-2 comorbidities: Bronchitis, Diabetes, Htn, OA are also affecting patient's functional outcome.   REHAB POTENTIAL: Good  CLINICAL DECISION MAKING: Evolving/moderate complexity  EVALUATION COMPLEXITY: Moderate   GOALS: Goals reviewed with patient? Yes  SHORT TERM GOALS: Target date: 01/03/2024  Patient will be independent with initial HEP. Baseline: 100% PT assist required for correct completion  Goal status: INITIAL  2.  Patient will report at least 25% improvement in R knee pain to improve QOL. Baseline: 6/10 average, 8/10 worst Goal status: INITIAL  LONG TERM GOALS: Target date: 01/31/2024  Patient will be independent with advanced/ongoing HEP to improve outcomes and carryover.  Baseline: no advanced HEP yet Goal status: INITIAL  2.  Patient will report at least 50-75% improvement in R knee pain to improve  QOL. Baseline: 6-8/10 Goal status: INITIAL  3.  Patient will demonstrate improved R knee AROM to 0-120 degrees to allow for normal gait and stair mechanics. Baseline: Refer to above LE ROM table Goal status: INITIAL  4.  Patient will demonstrate improved R knee and RLE strength to >/= 4+/5 for improved stability and ease of mobility. Baseline: Refer to above LE MMT table Goal status: INITIAL  5.  Patient will be able to ambulate x 10-20 minutes with no device, and normal gait pattern without increased pain to access community for shopping Baseline: 200 feet with walker Goal status: INITIAL  6. Patient will be able to ascend/descend stairs with 1 HR and reciprocal step pattern safely to access home and community.  Baseline: both hands required and 2 foot on 1 step advancedment Goal status: INITIAL  7.  Patient will report >/= 60/80 on LEFS (MCID = 9 pts) to demonstrate improved functional ability. Baseline: 49  Goal status: INITIAL  8.  Patient will demonstrate at least 20/24 on DGI to decrease risk of falls. Baseline: 16/20 Goal status: INITIAL     PLAN:  PT FREQUENCY: 2x/week  PT DURATION: 8 weeks  PLANNED INTERVENTIONS: {97164- PT Re-evaluation, 97750- Physical Performance Testing, 97110-Therapeutic exercises, 97530- Therapeutic activity, [id number]- Neuromuscular re-education, 97535- Self Care, [id number]- Manual therapy, [phone]- Gait training, [phone]- Electrical stimulation (unattended), 97016- Vasopneumatic device, 20560 (1-2 muscles), 20561 (3+ muscles)- Dry Needling, Patient/Family education, Balance training, Stair training, Taping, Joint mobilization, DME instructions, Cryotherapy, Moist heat, and Biofeedback  PLAN FOR NEXT SESSION:  Focus on increasing knee flexion, reassess if IT band MFR is needed; vaso is still needed for edema.    Maitland Lesiak, PT 12/15/2023, 1:23 PM

## 2023-12-16 DIAGNOSIS — G4733 Obstructive sleep apnea (adult) (pediatric): Secondary | ICD-10-CM | POA: Diagnosis not present

## 2023-12-18 ENCOUNTER — Ambulatory Visit: Attending: Nurse Practitioner | Admitting: Nurse Practitioner

## 2023-12-18 ENCOUNTER — Encounter: Payer: Self-pay | Admitting: Nurse Practitioner

## 2023-12-18 ENCOUNTER — Other Ambulatory Visit (HOSPITAL_COMMUNITY): Payer: Self-pay

## 2023-12-18 VITALS — BP 107/65 | HR 75 | Resp 19 | Ht 62.0 in | Wt 191.8 lb

## 2023-12-18 DIAGNOSIS — Z1211 Encounter for screening for malignant neoplasm of colon: Secondary | ICD-10-CM

## 2023-12-18 DIAGNOSIS — Z7985 Long-term (current) use of injectable non-insulin antidiabetic drugs: Secondary | ICD-10-CM | POA: Diagnosis not present

## 2023-12-18 DIAGNOSIS — E113212 Type 2 diabetes mellitus with mild nonproliferative diabetic retinopathy with macular edema, left eye: Secondary | ICD-10-CM | POA: Diagnosis not present

## 2023-12-18 DIAGNOSIS — E119 Type 2 diabetes mellitus without complications: Secondary | ICD-10-CM

## 2023-12-18 DIAGNOSIS — Z1231 Encounter for screening mammogram for malignant neoplasm of breast: Secondary | ICD-10-CM

## 2023-12-18 DIAGNOSIS — Z23 Encounter for immunization: Secondary | ICD-10-CM | POA: Diagnosis not present

## 2023-12-18 DIAGNOSIS — Z794 Long term (current) use of insulin: Secondary | ICD-10-CM | POA: Diagnosis not present

## 2023-12-18 LAB — POCT GLYCOSYLATED HEMOGLOBIN (HGB A1C): Hemoglobin A1C: 7.4 % — AB (ref 4.0–5.6)

## 2023-12-18 MED ORDER — RAMIPRIL 2.5 MG PO CAPS: 2.5000 mg | ORAL_CAPSULE | Freq: Every day | ORAL | 1 refills | Status: AC

## 2023-12-18 MED ORDER — TRULICITY 3 MG/0.5ML ~~LOC~~ SOAJ
3.0000 mg | SUBCUTANEOUS | 0 refills | Status: DC
  Filled 2023-12-18: qty 6, 84d supply, fill #0

## 2023-12-18 MED ORDER — TRULICITY 3 MG/0.5ML ~~LOC~~ SOAJ
3.0000 mg | SUBCUTANEOUS | 0 refills | Status: DC
Start: 2023-12-18 — End: 2024-02-15

## 2023-12-18 MED ORDER — DAPAGLIFLOZIN PROPANEDIOL 5 MG PO TABS
5.0000 mg | ORAL_TABLET | Freq: Every day | ORAL | 2 refills | Status: DC
  Filled 2023-12-18: qty 100, 100d supply, fill #0

## 2023-12-18 MED ORDER — RAMIPRIL 2.5 MG PO CAPS
2.5000 mg | ORAL_CAPSULE | Freq: Every day | ORAL | 1 refills | Status: DC
  Filled 2023-12-18: qty 90, 90d supply, fill #0

## 2023-12-18 MED ORDER — DAPAGLIFLOZIN PROPANEDIOL 5 MG PO TABS
5.0000 mg | ORAL_TABLET | Freq: Every day | ORAL | 2 refills | Status: AC
Start: 2023-12-18 — End: ?

## 2023-12-18 NOTE — Patient Instructions (Signed)
 [facility] Located in: [facility] Address: [address], [place], [place] [address] Phone: [phone]

## 2023-12-18 NOTE — Progress Notes (Signed)
 Assessment & Plan:  Ann Foster was seen today for diabetes.  Diagnoses and all orders for this visit:  Diabetes mellitus treated with injections of non-insulin  medication A1c increased from 7.0 to 7.4, possibly due to prednisone  use s/p right knee surgery Weight decreased from 198 lbs to 191 lbs. No medication adjustments needed. - Monitor A1c and weight. - Continue current diabetes medications. -     Dulaglutide  (TRULICITY ) 3 MG/0.5ML SOAJ; Inject 3 mg as directed once a week. -     ramipril  (ALTACE ) 2.5 MG capsule; Take 1 capsule (2.5 mg total) by mouth daily. -     dapagliflozin  propanediol (FARXIGA ) 5 MG TABS tablet; Take 1 tablet (5 mg total) by mouth daily before breakfast.  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  Behind on mammogram, plans to schedule by year-end. Colonoscopy due in November, referral made. Cholesterol check needed. - Administer pneumonia vaccine today. - Check cholesterol levels.   Patient has been counseled on age-appropriate routine health concerns for screening and prevention. These are reviewed and up-to-date. Referrals have been placed accordingly. Immunizations are up-to-date or declined.    Subjective:   Chief Complaint  Patient presents with   Diabetes    Ann Foster 58 y.o. female presents to office today   She has a past medical history of Ankle fracture (2016), Aortic atherosclerosis, Bronchitis, DM2, Hypertension, OA of  shoulder, and Osteoarthritis B/L knees (s/p R knee arthroplasty 11-20-2023)    DM 2 Recently, she was started on steroids due to nerve inflammation, resulting in a slight increase in her A1c from 7 to 7.4. She has since completed po prednisone . Despite this, her weight has decreased from 198 pounds in April to 191 pounds currently. Lab Results  Component Value Date   HGBA1C 7.4 (A) 12/18/2023   Lab Results  Component Value Date   HGBA1C 7.0 (H) 11/09/2023     BP Readings from Last 3 Encounters:  12/18/23 107/65  11/21/23 (!) 119/54  11/09/23 133/75     She is currently undergoing physical therapy for her right knee, which began two days post-surgery. Initially, she engaged in home therapy for four days before transitioning to outpatient therapy. She reports that her flexibility was measured at ninety-six percent, and she is working to reduce swelling.  She is taking methocarbamol  for right-sided pain and back pain, which she finds effective.    She reports that she is due for a mammogram and a colonoscopy, and that her colonoscopy is scheduled for November.   Review of Systems  Constitutional:  Negative for fever, malaise/fatigue and weight loss.  HENT: Negative.  Negative for nosebleeds.   Eyes: Negative.  Negative for blurred vision, double vision and photophobia.  Respiratory: Negative.  Negative for cough and shortness of breath.   Cardiovascular: Negative.  Negative for chest pain, palpitations and leg swelling.  Gastrointestinal: Negative.  Negative for heartburn, nausea and vomiting.  Musculoskeletal:  Positive for back  pain. Negative for myalgias.  Neurological:  Positive for sensory change. Negative for dizziness, focal weakness, seizures and headaches.  Psychiatric/Behavioral: Negative.  Negative for suicidal ideas.     Past Medical History:  Diagnosis Date   Ankle fracture 2016   Right   Aortic atherosclerosis (HCC)    trace calcific atherosclerosis aortic arch  per ct neck done 12-22-17   Bronchitis    Diabetes mellitus without complication (HCC)    Hypertension    OA (osteoarthritis) of shoulder    left shoulder, both knees arthritis   Osteoarthritis, knee    Sleep apnea    wears CPAP    Past Surgical History:  Procedure Laterality Date   CHOLECYSTECTOMY N/A 04/15/2023   Procedure: LAPAROSCOPIC CHOLECYSTECTOMY WITH ICG DYE;  Surgeon: Ebbie Cough, MD;  Location: [facility];  Service: General;  Laterality: N/A;   COLONOSCOPY     TOTAL KNEE ARTHROPLASTY Right 11/20/2023   Procedure: ARTHROPLASTY, KNEE, TOTAL RIGHT;  Surgeon: Jerri Kay HERO, MD;  Location: [facility];  Service: Orthopedics;  Laterality: Right;    Family History  Problem Relation Age of Onset   Breast cancer Mother    Colon cancer Father 33   Diabetes Son    Esophageal cancer Neg Hx    Stomach cancer Neg Hx    Liver disease Neg Hx    Pancreatic cancer Neg Hx    Rectal cancer Neg Hx     Social History Reviewed with no changes to be made today.   Outpatient Medications Prior to Visit  Medication Sig Dispense Refill   Accu-Chek Softclix Lancets lancets Check blood glucose level by fingerstick once per day. E11.65 100 each 2   albuterol  (VENTOLIN  HFA) 108 (90 Base) MCG/ACT inhaler Inhale 2 puffs into the lungs every 6 (six) hours as needed for wheezing or shortness of breath. 18 g 2   apixaban  (ELIQUIS ) 2.5 MG TABS tablet Take one tablet by mouth twice daily for 30 days after surgery to prevent blood clots 60 tablet 0   atorvastatin  (LIPITOR) 80 MG tablet Take 1 tablet (80 mg total) by mouth daily. 90 tablet 1   Blood Glucose Monitoring Suppl (ACCU-CHEK GUIDE) w/Device KIT Check blood glucose level by fingerstick once per day. E11.65 1 kit 0   buPROPion  (WELLBUTRIN  SR) 150 MG 12 hr tablet TAKE 1 TABLET(150 MG) BY MOUTH TWICE DAILY 60 tablet 1   busPIRone  (BUSPAR ) 10 MG tablet TAKE 1 TABLET(10 MG) BY MOUTH TWICE DAILY 60 tablet 1   docusate sodium  (COLACE) 100 MG capsule Take 1  capsule (100 mg total) by mouth daily as needed. 30 capsule 2   estradiol  (ESTRACE ) 0.1 MG/GM vaginal cream Apply 1 gram per vagina every night for 2 weeks, then apply three times a week 42.5 g 12   glucose blood (ACCU-CHEK GUIDE) test strip Check blood glucose level by fingerstick once per day. E11.65 100 each 2   Insulin  Pen Needle (B-D UF III MINI PEN NEEDLES) 31G X 5 MM MISC Use as instructed. Inject into the skin once weekly 100 each 1   methocarbamol  (ROBAXIN ) 750 MG tablet Take 1 tablet (750 mg total) by mouth 3 (three) times daily as needed. 30 tablet 2   methocarbamol  (ROBAXIN ) 750 MG tablet Take 1 tablet (750 mg total) by mouth 3 (three) times daily as needed. 30 tablet 2   methylPREDNISolone  (MEDROL  DOSEPAK) 4 MG TBPK tablet Take as directed on package 21 tablet 0   montelukast  (SINGULAIR ) 10 MG tablet Take 1  tablet (10 mg total) by mouth at bedtime. 30 tablet 3   omeprazole  (PRILOSEC) 40 MG capsule Take 1 capsule (40 mg total) by mouth daily. 90 capsule 3   ondansetron  (ZOFRAN ) 4 MG tablet Take 1 tablet (4 mg total) by mouth every 8 (eight) hours as needed for nausea or vomiting. 40 tablet 0   oxyCODONE -acetaminophen  (PERCOCET) 5-325 MG tablet Take 1-2 tablets by mouth every 6 (six) hours as needed. To be taken after surgery 40 tablet 0   oxyCODONE -acetaminophen  (PERCOCET) 5-325 MG tablet Take 1-2 tablets by mouth 3 (three) times daily as needed. 40 tablet 0   Prasterone  (INTRAROSA ) 6.5 MG INST Insert one capsule vaginally 28 each 3   dapagliflozin  propanediol (FARXIGA ) 5 MG TABS tablet TAKE 1 TABLET BY MOUTH ONCE  DAILY BEFORE BREAKFAST 100 tablet 1   Dulaglutide  (TRULICITY ) 3 MG/0.5ML SOAJ Inject 3 mg as directed once a week. 6 mL 0   ramipril  (ALTACE ) 2.5 MG capsule TAKE 1 CAPSULE(2.5 MG) BY MOUTH DAILY 90 capsule 0   doxycycline  (VIBRA -TABS) 100 MG tablet Take 1 tablet (100 mg total) by mouth 2 (two) times daily. To be taken after surgery (Patient not taking: Reported on 12/18/2023) 20  tablet 0   fluticasone  (FLONASE ) 50 MCG/ACT nasal spray Place 2 sprays into both nostrils daily as needed for allergies. (Patient not taking: Reported on 12/18/2023)     No facility-administered medications prior to visit.    No Known Allergies     Objective:    BP 107/65 (BP Location: Left Arm, Patient Position: Sitting, Cuff Size: Normal)   Pulse 75   Resp 19   Ht 5' 2 (1.575 m)   Wt 191 lb 12.8 oz (87 kg)   SpO2 100%   BMI 35.08 kg/m  Wt Readings from Last 3 Encounters:  12/18/23 191 lb 12.8 oz (87 kg)  11/20/23 (P) 193 lb (87.5 kg)  11/09/23 196 lb 6.4 oz (89.1 kg)    Physical Exam Vitals and nursing note reviewed.  Constitutional:      Appearance: She is well-developed.  HENT:     Head: Normocephalic and atraumatic.  Cardiovascular:     Rate and Rhythm: Normal rate and regular rhythm.     Heart sounds: Normal heart sounds. No murmur heard.    No friction rub. No gallop.  Pulmonary:     Effort: Pulmonary effort is normal. No tachypnea or respiratory distress.     Breath sounds: Normal breath sounds. No decreased breath sounds, wheezing, rhonchi or rales.  Chest:     Chest wall: No tenderness.  Abdominal:     General: Bowel sounds are normal.     Palpations: Abdomen is soft.  Musculoskeletal:        General: Normal range of motion.     Cervical back: Normal range of motion.     Right knee: Swelling (mild. surgical incision intact) present.  Skin:    General: Skin is warm and dry.  Neurological:     Mental Status: She is alert and oriented to person, place, and time.     Coordination: Coordination normal.  Psychiatric:        Behavior: Behavior normal. Behavior is cooperative.        Thought Content: Thought content normal.        Judgment: Judgment normal.          Patient has been counseled extensively about nutrition and exercise as well as the importance of adherence with medications and regular follow-up.  The patient was given clear instructions to  go to ER or return to medical center if symptoms don't improve, worsen or new problems develop. The patient verbalized understanding.   Follow-up: Return in about 3 months (around 03/29/2024).   Haze LELON Servant, FNP-BC [facility] [place], [place] [phone]   12/18/2023, 10:06 AM

## 2023-12-19 ENCOUNTER — Ambulatory Visit: Admitting: Rehabilitation

## 2023-12-19 DIAGNOSIS — M25561 Pain in right knee: Secondary | ICD-10-CM

## 2023-12-19 DIAGNOSIS — R600 Localized edema: Secondary | ICD-10-CM

## 2023-12-19 DIAGNOSIS — R262 Difficulty in walking, not elsewhere classified: Secondary | ICD-10-CM

## 2023-12-19 DIAGNOSIS — M6281 Muscle weakness (generalized): Secondary | ICD-10-CM

## 2023-12-19 DIAGNOSIS — M25661 Stiffness of right knee, not elsewhere classified: Secondary | ICD-10-CM

## 2023-12-19 NOTE — Therapy (Signed)
 OUTPATIENT PHYSICAL THERAPY LOWER EXTREMITY EVALUATION   Patient Name: Lizzeth Meder MRN: [id number] DOB: Jan 02, 1966, 58 y.o., female Today's Date: 12/19/2023   END OF SESSION:  PT End of Session - 12/19/23 1018     Visit Number 6    Date for PT Re-Evaluation 01/31/24    Authorization Type [employer] Dual complete    PT Start Time 1015    PT Stop Time 1117    PT Time Calculation (min) 62 min    Activity Tolerance No increased pain;Patient tolerated treatment well    Behavior During Therapy [facility] for tasks assessed/performed             Past Medical History:  Diagnosis Date   Ankle fracture 2016   Right   Aortic atherosclerosis (HCC)    trace calcific atherosclerosis aortic arch per ct neck done 12-22-17   Bronchitis    Diabetes mellitus without complication (HCC)    Hypertension    OA (osteoarthritis) of shoulder    left shoulder, both knees arthritis   Osteoarthritis, knee    Sleep apnea    wears CPAP   Past Surgical History:  Procedure Laterality Date   CHOLECYSTECTOMY N/A 04/15/2023   Procedure: LAPAROSCOPIC CHOLECYSTECTOMY WITH ICG DYE;  Surgeon: Ebbie Cough, MD;  Location: [facility];  Service: General;  Laterality: N/A;   COLONOSCOPY     TOTAL KNEE ARTHROPLASTY Right 11/20/2023   Procedure: ARTHROPLASTY, KNEE, TOTAL RIGHT;  Surgeon: Jerri Kay HERO, MD;  Location: [facility];  Service: Orthopedics;  Laterality: Right;   Patient Active Problem List   Diagnosis Date Noted   Status post total right knee replacement 11/20/2023   Primary osteoarthritis of right knee 09/26/2023   Snoring 06/07/2023   Acute cholecystitis 04/13/2023   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, with long-term current use of insulin  (HCC) 04/25/2022   Amblyopia of eye, right 07/27/2021   Corneal guttata of left eye 07/27/2021   Influenza vaccine refused 07/29/2020   COVID-19 vaccine dose declined 07/29/2020   Vasomotor symptoms  due to menopause 04/24/2020   Atrophic vaginitis 04/24/2020   Impingement syndrome of left shoulder 01/23/2018    PCP: Theotis Haze ORN, NP   REFERRING PROVIDER: Jerri Kay HERO, MD   REFERRING DIAG:  M17.11 (ICD-10-CM) - Primary osteoarthritis of right knee  THERAPY DIAG:  Acute pain of right knee  Stiffness of right knee, not elsewhere classified  Difficulty in walking, not elsewhere classified  Muscle weakness (generalized)  Localized edema  RATIONALE FOR EVALUATION AND TREATMENT: Rehabilitation  ONSET DATE: 11/20/23  NEXT MD VISIT: 01/02/24 Dr Jerri.   SUBJECTIVE:  SUBJECTIVE STATEMENT: Patient reports feeling some better . States was able to do full revolutions on her pedaler bike at home this weekend.  Rates pain today 4/10  Patient referred to PT following  R TKA on by DR XU on 11/20/23.   She has had 2 weeks HH PT.     She is also c/o nerve pain in the R hip and anterolateral thigh that has been present since surgery and is attributed to the nerve block.  She had her bandage removed yesterday and has steri strips in place.  There is no redness/drainage/dehischence noted.  Calf edema is minimal.  Homan's is negative RLE.   Patient has end stage L knee OA as well and hopes to the L TKA done before the year is out once she is recovered from this surgery.  She has been walker dependent since surgery, but no falls.  PAIN: Are you having pain? Yes: NPRS scale: 7/10 Pain location: R knee Pain description: dull to sharp variably  Aggravating factors: end ROM flex/ext; prlonged walking Relieving factors: ice, meds, rest, elevation  PERTINENT HISTORY:  Bronchitis, Diabetes, Htn, OA  PRECAUTIONS: None  RED FLAGS: None  WEIGHT BEARING RESTRICTIONS: No  FALLS:  Has patient fallen  in last 6 months? No  LIVING ENVIRONMENT: Lives with: lives with their family Lives in: House/apartment Stairs: Yes: External: 2 steps; none Has following equipment at home: Walker - 2 wheeled, shower chair, and bed side commode  OCCUPATION:  disabled  PLOF: Independentused cane some prior to surgery due to pain, but normally independent with no device  PATIENT GOALS: walk normally without pain and get my other knee done   OBJECTIVE: (objective measures completed at initial evaluation unless otherwise dated)  PATIENT SURVEYS:  LEFS = 49/80  COGNITION: Overall cognitive status: Within functional limits for tasks assessed    SENSATION: WFL  EDEMA:  Circumferential: 42.5 cm on L knee; 44.5 cm on R knee 7/18: LLE =  43.5; RLE = 45 cm  POSTURE:  No Significant postural limitations  PALPATION: TTP over the medial and lateral R knee, along with R mid to distal IT band, and distal quads  MUSCLE LENGTH: Hamstrings: Right 80 deg; Left 90  deg Hamstrings: 90 degrees + SLR BLE ITB: mild tightness RLE Heelcord: mild tight RLE  LOWER EXTREMITY ROM:  Active ROM Right eval Left eval  Hip flexion    Hip extension    Hip abduction    Hip adduction    Hip internal rotation    Hip external rotation    Knee flexion 70 120  Knee extension -10 0  Ankle dorsiflexion 0 5  Ankle plantarflexion    Ankle inversion    Ankle eversion     Passive ROM Right eval Left eval  Hip flexion    Hip extension    Hip abduction    Hip adduction    Hip internal rotation    Hip external rotation    Knee flexion 80   Knee extension -6   Ankle dorsiflexion    Ankle plantarflexion    Ankle inversion    Ankle eversion    (Blank rows = not tested)  LOWER EXTREMITY MMT:  MMT Right eval Left eval  Hip flexion 4- 4+  Hip extension    Hip abduction 4- 4+  Hip adduction    Hip internal rotation 3+ 4  Hip external rotation 4- 5  Knee flexion 4+ 5  Knee extension 4- 5  Ankle dorsiflexion  4 4  Ankle plantarflexion    Ankle inversion    Ankle eversion     (Blank rows = not tested)   FUNCTIONAL TESTS:  5x STS = 22.41 TUG = 18.39 DGI =  16   GAIT: Distance walked: 200 feet into clinic Assistive device utilized: Walker - 2 wheeled Level of assistance: Complete Independence Gait pattern: decreased stance time- Right and antalgic Comments: ambulates independently with FWW with decreased R knee flexion in swing, decreased stance time RLE with mild limp and decreased WB RLE;   can ambulate with a SPC but more pronounced limp and requires min gait belt assist for safety;   able to ambulate down/up 1 flight steps but has to ambulate 2 feet to same step using BUE on same rail   TODAY'S TREATMENT:  12/19/23 THERAPEUTIC EXERCISE: To improve strength and ROM.  Demonstration, verbal and tactile cues throughout for technique. Bike some partial ROM, some full RPM x 8'  THERAPEUTIC ACTIVITIES: To improve functional performance.  Demonstration, verbal and tactile cues throughout for technique. Aerobic step calf stretch x 1' x 2 RLE Aerobic step lunge x 20 Aerobic step up F x 2/10; lateral x 2/10 RLE Lateral lunge on RLE x 2/10 Standing hip ext RTB x 20 RLE Standing hip abd RTB x 20 RLE  Seated knee curls RTB x 20 RLE Seated hip flexor stretch x 1' Seated LAQ 2# x 2/20 RLE Prone knee extension x 20 RLE Prone knee flexion stretch w/ PT assist x 1'  NEUROMUSCULAR RE-EDUCATION: To improve balance. Standing unsupported:  Toe/heel rocking x 2/20   Minisquat x 2/20   Marching x 2/20 BLE  Vasopneumatic to R knee at min compression x 15' at 34 degrees.   12/15/23 THERAPEUTIC EXERCISE: To improve ROM.  Demonstration, verbal and tactile cues throughout for technique. Bike partial ROM x 8'  THERAPEUTIC ACTIVITIES: To improve functional performance.  Demonstration, verbal and tactile cues throughout for technique. Aerobic step calf stretch x 1' x 2 Aerobic step lunges x 20 Step  ups F x 10; lateral x 10 RLE Standing:  toe to heel rocking no UE support x 20 Minisquats x 20  Marching 3# x 20 Hip abd 3# x 20 Hip ext 3# x 20 Knee flexion 3# x 20 LAQ 3# X 20 Prone knee extension 3# x 20 Prone knee flexion 0# x 20 Prone PNF flexion stretching contract-relax-contract for knee flexion x 4' SKTC flexion stretching with PNF C-R-C x 4'  MANUAL THERAPY: To promote improved flexibility, increased ROM, and reduced pain utilizing myofascial release and scar mobilization MFR to in SL to R IT band x 5' Scar massage cross friction and j-hooks x 2'  Vasopneumatic to R knee at min compression x 15' at 34 degrees.   12/13/23 THERAPEUTIC EXERCISE: To improve ROM.  Demonstration, verbal and tactile cues throughout for technique. Bike partial ROM x 8' no tension  THERAPEUTIC ACTIVITIES: To improve functional performance.  Demonstration, verbal and tactile cues throughout for technique. Aerobic step calf stretch x 1' x 2  Aerobic step forward lunge in step stance x 20 Step ups forward x 10 RLE Step ups lateral x 2/10 RLE Seated LAQ x 20 w/ 2 sec holds x 20 RLE Seated sit n slide knee flexion sttretch x 1' x 2 RLE Prone reverse QS/knee extension x 20 Prone knee curls x 20 RLE Prone knee flexion PNF with CR and active flexion with PT assist SKTC flexion stretch x 1' x 2 RLE  12/11/23 Bike partial ROM L1 x 6'  Seated LAQ 2# x 3/10 Supine SLR 2# x 2/10 Supine SAQ 2# 2/10 QS x 20 HS x 20 SKTC flexion stretch x 1' x 2 RLE Prone knee flexion/curls x 20 RLE Prone knee flexion stretch x 1' x 2 RLE  Standing calf stretch x 1' x 2 RLE Standing toe raises no support x 20 Standing heel raises no support x 20 Minisquats no support x 20 Marching no support x 20 Aerobic step ups x 2/10 forward RTB TKE x 20  Vasopneumatic to R knee at min compression x 15' at 34 degrees.  12/08/23 Bike partial ROM L1 x 5' Gait with SPC with cueing and tactile cues for R knee flexion in  swing, proper cane sequence Supine QS x 3/10 Supine SAQ x 3/10 SLR x 2/10 SKTC knee flexion stretch x 1' x 2 RLE   Standing unsupported toe raise, heel raise, minisquat, march BLE x 20 ea with CGA to min assist for balance, L weight shifting for 50/50% Standing calf stretch on foam x 1' x 2 RLE Standing supported hip ABD, hip ext, and knee flexion x 20 RLE Seated LAQ x 20 RLE Step stance rock/lunge F/B on RLE x 20 Standing RTB TKE x 20 RLE  12/06/23 Initial HEP, safety with gait with walker and cane, instruction for ice 3-4 x daily/15-30 min to R knee; keeping R kne straight when lying in bed without pillows; no submerging incision   PATIENT EDUCATION:  Education details: PT eval findings, anticipated POC, initial HEP, transfer safety, and gait safety with spc and fww  Person educated: Patient Education method: Explanation, Demonstration, Verbal cues, Tactile cues, and MedBridgeGO app access provided Education comprehension: verbalized understanding, verbal cues required, tactile cues required, and needs further education  HOME EXERCISE PROGRAM: Access Code: [email or web address] URL: [email or web address] Date: 12/06/2023 Prepared by: Garnette Montclair  Exercises - Supine Ankle Pumps  - 3 x daily - 7 x weekly - 1 sets - 10 reps - Supine Quad Set  - 3 x daily - 7 x weekly - 1 sets - 10 reps - Straight Leg Raise  - 3 x daily - 7 x weekly - 1 sets - 10 reps - Supine Heel Slides  - 3 x daily - 7 x weekly - 1 sets - 10 reps - Supine Hip Abduction  - 3 x daily - 7 x weekly - 1 sets - 10 reps - Seated Long Arc Quad  - 3 x daily - 7 x weekly - 1 sets - 10 reps - Seated Knee Flexion  - 3 x daily - 7 x weekly - 1 sets - 10 reps - Supine Knee Extension Strengthening  - 1 x daily - 7 x weekly - 3 sets - 10 reps - Standing Heel Raise with Support  - 1 x daily - 7 x weekly - 3 sets - 5 reps - Toe Raises with Counter Support  - 1 x daily - 7 x weekly - 3 sets - 5 reps - Mini Squats at Table   - 1 x daily - 7 x weekly - 3 sets - 5 reps - Standing Hip Flexion AROM  - 1 x daily - 7 x weekly - 3 sets - 5 reps - Standing Hip Abduction  - 1 x daily - 7 x weekly - 3 sets - 5 reps - Standing Hip Extension  - 1 x daily - 7 x weekly - 3 sets - 5 reps -  Standing Knee Flexion AROM with Chair Support  - 1 x daily - 7 x weekly - 3 sets - 5 reps   ASSESSMENT:  CLINICAL IMPRESSION: 7/18: Knee flexion today is 90 degrees with firm end feel.  Continuing to work on contract relax PNF to improve knee flexion.  Patient is encouraged to work with her bike at home to get more ROM and with prone stretching  EVAL:  Saprina Chuong is a 58 y.o. female who was referred to physical therapy for evaluation and treatment following a R TKA by Dr Jerri 2 weeks ago.   Patient reports onset of R knee pain beginning at least a year ago. Pain is worse with R knee flexion and extension and with FWB unsupported on RLE.  Patient has deficits in R knee ROM, R LE flexibility, RLE strength, balance, safety, and HEP understanding which are interfering with ADLs and are impacting quality of life.  On LEFS patient scored 49/80 demonstrating 38.75% functional limitation.  Chyrl will benefit from skilled PT to address above deficits to improve mobility and activity tolerance with decreased pain interference.  OBJECTIVE IMPAIRMENTS: Abnormal gait, decreased balance, decreased knowledge of use of DME, decreased mobility, difficulty walking, decreased ROM, decreased strength, decreased safety awareness, increased edema, increased muscle spasms, impaired flexibility, and pain.   ACTIVITY LIMITATIONS: carrying, lifting, bending, standing, squatting, sleeping, stairs, and locomotion level  PARTICIPATION LIMITATIONS: meal prep, cleaning, laundry, driving, shopping, and community activity  PERSONAL FACTORS: Age, Fitness, and 1-2 comorbidities: Bronchitis, Diabetes, Htn, OA are also affecting patient's functional outcome.   REHAB  POTENTIAL: Good  CLINICAL DECISION MAKING: Evolving/moderate complexity  EVALUATION COMPLEXITY: Moderate   GOALS: Goals reviewed with patient? Yes  SHORT TERM GOALS: Target date: 01/03/2024  Patient will be independent with initial HEP. Baseline: 100% PT assist required for correct completion  Goal status: INITIAL  2.  Patient will report at least 25% improvement in R knee pain to improve QOL. Baseline: 6/10 average, 8/10 worst Goal status: INITIAL  LONG TERM GOALS: Target date: 01/31/2024  Patient will be independent with advanced/ongoing HEP to improve outcomes and carryover.  Baseline: no advanced HEP yet Goal status: INITIAL  2.  Patient will report at least 50-75% improvement in R knee pain to improve QOL. Baseline: 6-8/10 Goal status: INITIAL  3.  Patient will demonstrate improved R knee AROM to 0-120 degrees to allow for normal gait and stair mechanics. Baseline: Refer to above LE ROM table Goal status: INITIAL  4.  Patient will demonstrate improved R knee and RLE strength to >/= 4+/5 for improved stability and ease of mobility. Baseline: Refer to above LE MMT table Goal status: INITIAL  5.  Patient will be able to ambulate x 10-20 minutes with no device, and normal gait pattern without increased pain to access community for shopping Baseline: 200 feet with walker Goal status: INITIAL  6. Patient will be able to ascend/descend stairs with 1 HR and reciprocal step pattern safely to access home and community.  Baseline: both hands required and 2 foot on 1 step advancedment Goal status: INITIAL  7.  Patient will report >/= 60/80 on LEFS (MCID = 9 pts) to demonstrate improved functional ability. Baseline: 49  Goal status: INITIAL  8.  Patient will demonstrate at least 20/24 on DGI to decrease risk of falls. Baseline: 16/20 Goal status: INITIAL     PLAN:  PT FREQUENCY: 2x/week  PT DURATION: 8 weeks  PLANNED INTERVENTIONS: {97164- PT Re-evaluation, 97750-  Physical Performance  Testing, 97110-Therapeutic exercises, 97530- Therapeutic activity, [id number]- Neuromuscular re-education, [phone]- Self Care, [id number]- Manual therapy, [phone]- Gait training, [phone]- Electrical stimulation (unattended), 97016- Vasopneumatic device, [phone] (1-2 muscles), 20561 (3+ muscles)- Dry Needling, Patient/Family education, Balance training, Stair training, Taping, Joint mobilization, DME instructions, Cryotherapy, Moist heat, and Biofeedback  PLAN FOR NEXT SESSION:  Continue with R knee extension/flexion stretching, progress balance activities to get off SPC, continue vaso   Dwain Huhn, PT 12/19/2023, 11:59 AM

## 2023-12-21 ENCOUNTER — Ambulatory Visit

## 2023-12-21 DIAGNOSIS — M25561 Pain in right knee: Secondary | ICD-10-CM

## 2023-12-21 DIAGNOSIS — R262 Difficulty in walking, not elsewhere classified: Secondary | ICD-10-CM

## 2023-12-21 DIAGNOSIS — R600 Localized edema: Secondary | ICD-10-CM

## 2023-12-21 DIAGNOSIS — M25661 Stiffness of right knee, not elsewhere classified: Secondary | ICD-10-CM

## 2023-12-21 DIAGNOSIS — M6281 Muscle weakness (generalized): Secondary | ICD-10-CM

## 2023-12-21 NOTE — Therapy (Signed)
 OUTPATIENT PHYSICAL THERAPY LOWER EXTREMITY EVALUATION   Patient Name: Ann Foster MRN: [id number] DOB: Sep 28, 1965, 58 y.o., female Today's Date: 12/21/2023   END OF SESSION:  PT End of Session - 12/21/23 1402     Visit Number 7    Date for PT Re-Evaluation 01/31/24    Authorization Type [employer] Dual complete    PT Start Time 1313    PT Stop Time 1412    PT Time Calculation (min) 59 min    Activity Tolerance No increased pain;Patient tolerated treatment well    Behavior During Therapy [facility] for tasks assessed/performed              Past Medical History:  Diagnosis Date   Ankle fracture 2016   Right   Aortic atherosclerosis (HCC)    trace calcific atherosclerosis aortic arch per ct neck done 12-22-17   Bronchitis    Diabetes mellitus without complication (HCC)    Hypertension    OA (osteoarthritis) of shoulder    left shoulder, both knees arthritis   Osteoarthritis, knee    Sleep apnea    wears CPAP   Past Surgical History:  Procedure Laterality Date   CHOLECYSTECTOMY N/A 04/15/2023   Procedure: LAPAROSCOPIC CHOLECYSTECTOMY WITH ICG DYE;  Surgeon: Ebbie Cough, MD;  Location: [facility];  Service: General;  Laterality: N/A;   COLONOSCOPY     TOTAL KNEE ARTHROPLASTY Right 11/20/2023   Procedure: ARTHROPLASTY, KNEE, TOTAL RIGHT;  Surgeon: Jerri Kay HERO, MD;  Location: [facility] OR;  Service: Orthopedics;  Laterality: Right;   Patient Active Problem List   Diagnosis Date Noted   Status post total right knee replacement 11/20/2023   Primary osteoarthritis of right knee 09/26/2023   Snoring 06/07/2023   Acute cholecystitis 04/13/2023   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, with long-term current use of insulin  (HCC) 04/25/2022   Amblyopia of eye, right 07/27/2021   Corneal guttata of left eye 07/27/2021   Influenza vaccine refused 07/29/2020   COVID-19 vaccine dose declined 07/29/2020   Vasomotor  symptoms due to menopause 04/24/2020   Atrophic vaginitis 04/24/2020   Impingement syndrome of left shoulder 01/23/2018    PCP: Theotis Haze ORN, NP   REFERRING PROVIDER: Jerri Kay HERO, MD   REFERRING DIAG:  M17.11 (ICD-10-CM) - Primary osteoarthritis of right knee  THERAPY DIAG:  Acute pain of right knee  Stiffness of right knee, not elsewhere classified  Difficulty in walking, not elsewhere classified  Muscle weakness (generalized)  Localized edema  RATIONALE FOR EVALUATION AND TREATMENT: Rehabilitation  ONSET DATE: 11/20/23  NEXT MD VISIT: 01/02/24 Dr Jerri.   SUBJECTIVE:  SUBJECTIVE STATEMENT: Pt reports her incision very tender, it is stiff today.  Patient referred to PT following  R TKA on by DR XU on 11/20/23.   She has had 2 weeks HH PT.     She is also c/o nerve pain in the R hip and anterolateral thigh that has been present since surgery and is attributed to the nerve block.  She had her bandage removed yesterday and has steri strips in place.  There is no redness/drainage/dehischence noted.  Calf edema is minimal.  Homan's is negative RLE.   Patient has end stage L knee OA as well and hopes to the L TKA done before the year is out once she is recovered from this surgery.  She has been walker dependent since surgery, but no falls.  PAIN: Are you having pain? Yes: NPRS scale: 3/10 Pain location: R knee Pain description: dull to sharp variably  Aggravating factors: end ROM flex/ext; prlonged walking Relieving factors: ice, meds, rest, elevation  PERTINENT HISTORY:  Bronchitis, Diabetes, Htn, OA  PRECAUTIONS: None  RED FLAGS: None  WEIGHT BEARING RESTRICTIONS: No  FALLS:  Has patient fallen in last 6 months? No  LIVING ENVIRONMENT: Lives with: lives with their  family Lives in: House/apartment Stairs: Yes: External: 2 steps; none Has following equipment at home: Walker - 2 wheeled, shower chair, and bed side commode  OCCUPATION:  disabled  PLOF: Independentused cane some prior to surgery due to pain, but normally independent with no device  PATIENT GOALS: walk normally without pain and get my other knee done   OBJECTIVE: (objective measures completed at initial evaluation unless otherwise dated)  PATIENT SURVEYS:  LEFS = 49/80  COGNITION: Overall cognitive status: Within functional limits for tasks assessed    SENSATION: WFL  EDEMA:  Circumferential: 42.5 cm on L knee; 44.5 cm on R knee 7/18: LLE =  43.5; RLE = 45 cm  POSTURE:  No Significant postural limitations  PALPATION: TTP over the medial and lateral R knee, along with R mid to distal IT band, and distal quads  MUSCLE LENGTH: Hamstrings: Right 80 deg; Left 90  deg Hamstrings: 90 degrees + SLR BLE ITB: mild tightness RLE Heelcord: mild tight RLE  LOWER EXTREMITY ROM:  Active ROM Right eval Left eval  Hip flexion    Hip extension    Hip abduction    Hip adduction    Hip internal rotation    Hip external rotation    Knee flexion 70 120  Knee extension -10 0  Ankle dorsiflexion 0 5  Ankle plantarflexion    Ankle inversion    Ankle eversion     Passive ROM Right eval Left eval  Hip flexion    Hip extension    Hip abduction    Hip adduction    Hip internal rotation    Hip external rotation    Knee flexion 80   Knee extension -6   Ankle dorsiflexion    Ankle plantarflexion    Ankle inversion    Ankle eversion    (Blank rows = not tested)  LOWER EXTREMITY MMT:  MMT Right eval Left eval  Hip flexion 4- 4+  Hip extension    Hip abduction 4- 4+  Hip adduction    Hip internal rotation 3+ 4  Hip external rotation 4- 5  Knee flexion 4+ 5  Knee extension 4- 5  Ankle dorsiflexion 4 4  Ankle plantarflexion    Ankle inversion    Ankle eversion      (  Blank rows = not tested)   FUNCTIONAL TESTS:  5x STS = 22.41 TUG = 18.39 DGI =  16   GAIT: Distance walked: 200 feet into clinic Assistive device utilized: Walker - 2 wheeled Level of assistance: Complete Independence Gait pattern: decreased stance time- Right and antalgic Comments: ambulates independently with FWW with decreased R knee flexion in swing, decreased stance time RLE with mild limp and decreased WB RLE;   can ambulate with a SPC but more pronounced limp and requires min gait belt assist for safety;   able to ambulate down/up 1 flight steps but has to ambulate 2 feet to same step using BUE on same rail   TODAY'S TREATMENT:  12/21/23 THERAPEUTIC EXERCISE: To improve strength and ROM.  Demonstration, verbal and tactile cues throughout for technique. Bike some partial ROM, some full RPM x 8' Standing gastroc stretch on step 1' RLE Step lunge into step 10x5 RLE THERAPEUTIC ACTIVITIES: To improve functional performance.  Demonstration, verbal and tactile cues throughout for technique. Step ups 6' RLE x 20  Lateral step ups 6' RLE x 20 Heel raise drop from 6' step  x20 Squats with UE support 2x10 Vasopneumatic to R knee at min compression x 10' at 34 degrees. GAIT TRAINING: With [facility] cues to increase step length and to avoid circumduction during swing phase  12/19/23 THERAPEUTIC EXERCISE: To improve strength and ROM.  Demonstration, verbal and tactile cues throughout for technique. Bike some partial ROM, some full RPM x 8'  THERAPEUTIC ACTIVITIES: To improve functional performance.  Demonstration, verbal and tactile cues throughout for technique. Aerobic step calf stretch x 1' x 2 RLE Aerobic step lunge x 20 Aerobic step up F x 2/10; lateral x 2/10 RLE Lateral lunge on RLE x 2/10 Standing hip ext RTB x 20 RLE Standing hip abd RTB x 20 RLE  Seated knee curls RTB x 20 RLE Seated hip flexor stretch x 1' Seated LAQ 2# x 2/20 RLE Prone knee extension x 20 RLE Prone knee  flexion stretch w/ PT assist x 1'  NEUROMUSCULAR RE-EDUCATION: To improve balance. Standing unsupported:  Toe/heel rocking x 2/20   Minisquat x 2/20   Marching x 2/20 BLE  Vasopneumatic to R knee at min compression x 15' at 34 degrees.   12/15/23 THERAPEUTIC EXERCISE: To improve ROM.  Demonstration, verbal and tactile cues throughout for technique. Bike partial ROM x 8'  THERAPEUTIC ACTIVITIES: To improve functional performance.  Demonstration, verbal and tactile cues throughout for technique. Aerobic step calf stretch x 1' x 2 Aerobic step lunges x 20 Step ups F x 10; lateral x 10 RLE Standing:  toe to heel rocking no UE support x 20 Minisquats x 20  Marching 3# x 20 Hip abd 3# x 20 Hip ext 3# x 20 Knee flexion 3# x 20 LAQ 3# X 20 Prone knee extension 3# x 20 Prone knee flexion 0# x 20 Prone PNF flexion stretching contract-relax-contract for knee flexion x 4' SKTC flexion stretching with PNF C-R-C x 4'  MANUAL THERAPY: To promote improved flexibility, increased ROM, and reduced pain utilizing myofascial release and scar mobilization MFR to in SL to R IT band x 5' Scar massage cross friction and j-hooks x 2'  Vasopneumatic to R knee at min compression x 15' at 34 degrees.   12/13/23 THERAPEUTIC EXERCISE: To improve ROM.  Demonstration, verbal and tactile cues throughout for technique. Bike partial ROM x 8' no tension  THERAPEUTIC ACTIVITIES: To improve functional performance.  Demonstration, verbal  and tactile cues throughout for technique. Aerobic step calf stretch x 1' x 2  Aerobic step forward lunge in step stance x 20 Step ups forward x 10 RLE Step ups lateral x 2/10 RLE Seated LAQ x 20 w/ 2 sec holds x 20 RLE Seated sit n slide knee flexion sttretch x 1' x 2 RLE Prone reverse QS/knee extension x 20 Prone knee curls x 20 RLE Prone knee flexion PNF with CR and active flexion with PT assist SKTC flexion stretch x 1' x 2 RLE     12/11/23 Bike partial ROM L1 x  6'  Seated LAQ 2# x 3/10 Supine SLR 2# x 2/10 Supine SAQ 2# 2/10 QS x 20 HS x 20 SKTC flexion stretch x 1' x 2 RLE Prone knee flexion/curls x 20 RLE Prone knee flexion stretch x 1' x 2 RLE  Standing calf stretch x 1' x 2 RLE Standing toe raises no support x 20 Standing heel raises no support x 20 Minisquats no support x 20 Marching no support x 20 Aerobic step ups x 2/10 forward RTB TKE x 20  Vasopneumatic to R knee at min compression x 15' at 34 degrees.  12/08/23 Bike partial ROM L1 x 5' Gait with SPC with cueing and tactile cues for R knee flexion in swing, proper cane sequence Supine QS x 3/10 Supine SAQ x 3/10 SLR x 2/10 SKTC knee flexion stretch x 1' x 2 RLE   Standing unsupported toe raise, heel raise, minisquat, march BLE x 20 ea with CGA to min assist for balance, L weight shifting for 50/50% Standing calf stretch on foam x 1' x 2 RLE Standing supported hip ABD, hip ext, and knee flexion x 20 RLE Seated LAQ x 20 RLE Step stance rock/lunge F/B on RLE x 20 Standing RTB TKE x 20 RLE  12/06/23 Initial HEP, safety with gait with walker and cane, instruction for ice 3-4 x daily/15-30 min to R knee; keeping R kne straight when lying in bed without pillows; no submerging incision   PATIENT EDUCATION:  Education details: PT eval findings, anticipated POC, initial HEP, transfer safety, and gait safety with spc and fww  Person educated: Patient Education method: Explanation, Demonstration, Verbal cues, Tactile cues, and MedBridgeGO app access provided Education comprehension: verbalized understanding, verbal cues required, tactile cues required, and needs further education  HOME EXERCISE PROGRAM: Access Code: [email or web address] URL: [email or web address] Date: 12/06/2023 Prepared by: Garnette Montclair  Exercises - Supine Ankle Pumps  - 3 x daily - 7 x weekly - 1 sets - 10 reps - Supine Quad Set  - 3 x daily - 7 x weekly - 1 sets - 10 reps - Straight Leg Raise  -  3 x daily - 7 x weekly - 1 sets - 10 reps - Supine Heel Slides  - 3 x daily - 7 x weekly - 1 sets - 10 reps - Supine Hip Abduction  - 3 x daily - 7 x weekly - 1 sets - 10 reps - Seated Long Arc Quad  - 3 x daily - 7 x weekly - 1 sets - 10 reps - Seated Knee Flexion  - 3 x daily - 7 x weekly - 1 sets - 10 reps - Supine Knee Extension Strengthening  - 1 x daily - 7 x weekly - 3 sets - 10 reps - Standing Heel Raise with Support  - 1 x daily - 7 x weekly - 3 sets - 5 reps - Toe Raises with  Counter Support  - 1 x daily - 7 x weekly - 3 sets - 5 reps - Mini Squats at Table  - 1 x daily - 7 x weekly - 3 sets - 5 reps - Standing Hip Flexion AROM  - 1 x daily - 7 x weekly - 3 sets - 5 reps - Standing Hip Abduction  - 1 x daily - 7 x weekly - 3 sets - 5 reps - Standing Hip Extension  - 1 x daily - 7 x weekly - 3 sets - 5 reps - Standing Knee Flexion AROM with Chair Support  - 1 x daily - 7 x weekly - 3 sets - 5 reps   ASSESSMENT:  CLINICAL IMPRESSION: Pt tolerated the progression of exercises well. Cues required throughout session to correct form. Pt demonstrating difficulty ascending stairs w/o compensation. Reviewed gait training also to increase B step length.   EVAL:  Ann Foster is a 58 y.o. female who was referred to physical therapy for evaluation and treatment following a R TKA by Dr Jerri 2 weeks ago.   Patient reports onset of R knee pain beginning at least a year ago. Pain is worse with R knee flexion and extension and with FWB unsupported on RLE.  Patient has deficits in R knee ROM, R LE flexibility, RLE strength, balance, safety, and HEP understanding which are interfering with ADLs and are impacting quality of life.  On LEFS patient scored 49/80 demonstrating 38.75% functional limitation.  Morrison will benefit from skilled PT to address above deficits to improve mobility and activity tolerance with decreased pain interference.  OBJECTIVE IMPAIRMENTS: Abnormal gait, decreased balance,  decreased knowledge of use of DME, decreased mobility, difficulty walking, decreased ROM, decreased strength, decreased safety awareness, increased edema, increased muscle spasms, impaired flexibility, and pain.   ACTIVITY LIMITATIONS: carrying, lifting, bending, standing, squatting, sleeping, stairs, and locomotion level  PARTICIPATION LIMITATIONS: meal prep, cleaning, laundry, driving, shopping, and community activity  PERSONAL FACTORS: Age, Fitness, and 1-2 comorbidities: Bronchitis, Diabetes, Htn, OA are also affecting patient's functional outcome.   REHAB POTENTIAL: Good  CLINICAL DECISION MAKING: Evolving/moderate complexity  EVALUATION COMPLEXITY: Moderate   GOALS: Goals reviewed with patient? Yes  SHORT TERM GOALS: Target date: 01/03/2024  Patient will be independent with initial HEP. Baseline: 100% PT assist required for correct completion  Goal status: INITIAL  2.  Patient will report at least 25% improvement in R knee pain to improve QOL. Baseline: 6/10 average, 8/10 worst Goal status: INITIAL  LONG TERM GOALS: Target date: 01/31/2024  Patient will be independent with advanced/ongoing HEP to improve outcomes and carryover.  Baseline: no advanced HEP yet Goal status: INITIAL  2.  Patient will report at least 50-75% improvement in R knee pain to improve QOL. Baseline: 6-8/10 Goal status: INITIAL  3.  Patient will demonstrate improved R knee AROM to 0-120 degrees to allow for normal gait and stair mechanics. Baseline: Refer to above LE ROM table Goal status: INITIAL  4.  Patient will demonstrate improved R knee and RLE strength to >/= 4+/5 for improved stability and ease of mobility. Baseline: Refer to above LE MMT table Goal status: INITIAL  5.  Patient will be able to ambulate x 10-20 minutes with no device, and normal gait pattern without increased pain to access community for shopping Baseline: 200 feet with walker Goal status: INITIAL  6. Patient will be  able to ascend/descend stairs with 1 HR and reciprocal step pattern safely to access home and  community.  Baseline: both hands required and 2 foot on 1 step advancedment Goal status: INITIAL  7.  Patient will report >/= 60/80 on LEFS (MCID = 9 pts) to demonstrate improved functional ability. Baseline: 49  Goal status: INITIAL  8.  Patient will demonstrate at least 20/24 on DGI to decrease risk of falls. Baseline: 16/20 Goal status: INITIAL     PLAN:  PT FREQUENCY: 2x/week  PT DURATION: 8 weeks  PLANNED INTERVENTIONS: {97164- PT Re-evaluation, 97750- Physical Performance Testing, 97110-Therapeutic exercises, 97530- Therapeutic activity, 97112- Neuromuscular re-education, 97535- Self Care, [id number]- Manual therapy, [phone]- Gait training, [phone]- Electrical stimulation (unattended), 97016- Vasopneumatic device, 20560 (1-2 muscles), 20561 (3+ muscles)- Dry Needling, Patient/Family education, Balance training, Stair training, Taping, Joint mobilization, DME instructions, Cryotherapy, Moist heat, and Biofeedback  PLAN FOR NEXT SESSION:  Continue with R knee extension/flexion stretching, progress balance activities to get off SPC, continue vaso   Jalayla Chrismer L Maryam Feely, PTA 12/21/2023, 3:10 PM

## 2023-12-26 ENCOUNTER — Ambulatory Visit: Admitting: Rehabilitation

## 2023-12-26 DIAGNOSIS — R600 Localized edema: Secondary | ICD-10-CM

## 2023-12-26 DIAGNOSIS — M25561 Pain in right knee: Secondary | ICD-10-CM

## 2023-12-26 DIAGNOSIS — M6281 Muscle weakness (generalized): Secondary | ICD-10-CM

## 2023-12-26 DIAGNOSIS — M25661 Stiffness of right knee, not elsewhere classified: Secondary | ICD-10-CM

## 2023-12-26 DIAGNOSIS — R262 Difficulty in walking, not elsewhere classified: Secondary | ICD-10-CM

## 2023-12-26 NOTE — Therapy (Signed)
 OUTPATIENT PHYSICAL THERAPY LOWER EXTREMITY TREATMENT   Patient Name: Ann Foster MRN: [id number] DOB: 1966/01/22, 58 y.o., female Today's Date: 12/26/2023   END OF SESSION:  PT End of Session - 12/26/23 1021     Visit Number 8    Date for PT Re-Evaluation 01/31/24    Authorization Type [employer] Dual complete    PT Start Time 1019    PT Stop Time 1115    PT Time Calculation (min) 56 min    Activity Tolerance No increased pain;Patient tolerated treatment well    Behavior During Therapy [facility] for tasks assessed/performed              Past Medical History:  Diagnosis Date   Ankle fracture 2016   Right   Aortic atherosclerosis (HCC)    trace calcific atherosclerosis aortic arch per ct neck done 12-22-17   Bronchitis    Diabetes mellitus without complication (HCC)    Hypertension    OA (osteoarthritis) of shoulder    left shoulder, both knees arthritis   Osteoarthritis, knee    Sleep apnea    wears CPAP   Past Surgical History:  Procedure Laterality Date   CHOLECYSTECTOMY N/A 04/15/2023   Procedure: LAPAROSCOPIC CHOLECYSTECTOMY WITH ICG DYE;  Surgeon: Ebbie Cough, MD;  Location: [facility];  Service: General;  Laterality: N/A;   COLONOSCOPY     TOTAL KNEE ARTHROPLASTY Right 11/20/2023   Procedure: ARTHROPLASTY, KNEE, TOTAL RIGHT;  Surgeon: Jerri Kay HERO, MD;  Location: [facility];  Service: Orthopedics;  Laterality: Right;   Patient Active Problem List   Diagnosis Date Noted   Status post total right knee replacement 11/20/2023   Primary osteoarthritis of right knee 09/26/2023   Snoring 06/07/2023   Acute cholecystitis 04/13/2023   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, with long-term current use of insulin  (HCC) 04/25/2022   Amblyopia of eye, right 07/27/2021   Corneal guttata of left eye 07/27/2021   Influenza vaccine refused 07/29/2020   COVID-19 vaccine dose declined 07/29/2020   Vasomotor  symptoms due to menopause 04/24/2020   Atrophic vaginitis 04/24/2020   Impingement syndrome of left shoulder 01/23/2018    PCP: Theotis Haze ORN, NP   REFERRING PROVIDER: Jerri Kay HERO, MD   REFERRING DIAG:  M17.11 (ICD-10-CM) - Primary osteoarthritis of right knee  THERAPY DIAG:  Acute pain of right knee  Stiffness of right knee, not elsewhere classified  Difficulty in walking, not elsewhere classified  Muscle weakness (generalized)  Localized edema  RATIONALE FOR EVALUATION AND TREATMENT: Rehabilitation  ONSET DATE: 11/20/23  NEXT MD VISIT: 01/02/24 Dr Jerri.   SUBJECTIVE:  SUBJECTIVE STATEMENT: Pt. States feels ok and not a lot of pain on arrival.  States not using her cane at home, but using it when in community.    Patient referred to PT following  R TKA on by DR XU on 11/20/23.   She has had 2 weeks HH PT.     She is also c/o nerve pain in the R hip and anterolateral thigh that has been present since surgery and is attributed to the nerve block.  She had her bandage removed yesterday and has steri strips in place.  There is no redness/drainage/dehischence noted.  Calf edema is minimal.  Homan's is negative RLE.   Patient has end stage L knee OA as well and hopes to the L TKA done before the year is out once she is recovered from this surgery.  She has been walker dependent since surgery, but no falls.  PAIN: Are you having pain? Yes: NPRS scale: 3/10 Pain location: R knee Pain description: dull to sharp variably  Aggravating factors: end ROM flex/ext; prlonged walking Relieving factors: ice, meds, rest, elevation  PERTINENT HISTORY:  Bronchitis, Diabetes, Htn, OA  PRECAUTIONS: None  RED FLAGS: None  WEIGHT BEARING RESTRICTIONS: No  FALLS:  Has patient fallen in last 6  months? No  LIVING ENVIRONMENT: Lives with: lives with their family Lives in: House/apartment Stairs: Yes: External: 2 steps; none Has following equipment at home: Walker - 2 wheeled, shower chair, and bed side commode  OCCUPATION:  disabled  PLOF: Independentused cane some prior to surgery due to pain, but normally independent with no device  PATIENT GOALS: walk normally without pain and get my other knee done   OBJECTIVE: (objective measures completed at initial evaluation unless otherwise dated)  PATIENT SURVEYS:  LEFS = 49/80  COGNITION: Overall cognitive status: Within functional limits for tasks assessed    SENSATION: WFL  EDEMA:  Circumferential: 42.5 cm on L knee; 44.5 cm on R knee 7/18: LLE =  43.5; RLE = 45 cm  POSTURE:  No Significant postural limitations  PALPATION: TTP over the medial and lateral R knee, along with R mid to distal IT band, and distal quads  MUSCLE LENGTH: Hamstrings: Right 80 deg; Left 90  deg Hamstrings: 90 degrees + SLR BLE ITB: mild tightness RLE Heelcord: mild tight RLE  LOWER EXTREMITY ROM:  Active ROM Right eval Left eval  Hip flexion    Hip extension    Hip abduction    Hip adduction    Hip internal rotation    Hip external rotation    Knee flexion 70 120  Knee extension -10 0  Ankle dorsiflexion 0 5  Ankle plantarflexion    Ankle inversion    Ankle eversion     Passive ROM Right eval Left eval  Hip flexion    Hip extension    Hip abduction    Hip adduction    Hip internal rotation    Hip external rotation    Knee flexion 80   Knee extension -6   Ankle dorsiflexion    Ankle plantarflexion    Ankle inversion    Ankle eversion    (Blank rows = not tested)  LOWER EXTREMITY MMT:  MMT Right eval Left eval  Hip flexion 4- 4+  Hip extension    Hip abduction 4- 4+  Hip adduction    Hip internal rotation 3+ 4  Hip external rotation 4- 5  Knee flexion 4+ 5  Knee extension 4- 5  Ankle dorsiflexion 4 4   Ankle plantarflexion    Ankle inversion    Ankle eversion     (Blank rows = not tested)   FUNCTIONAL TESTS:  5x STS = 22.41 TUG = 18.39 DGI =  16   GAIT: Distance walked: 200 feet into clinic Assistive device utilized: Walker - 2 wheeled Level of assistance: Complete Independence Gait pattern: decreased stance time- Right and antalgic Comments: ambulates independently with FWW with decreased R knee flexion in swing, decreased stance time RLE with mild limp and decreased WB RLE;   can ambulate with a SPC but more pronounced limp and requires min gait belt assist for safety;   able to ambulate down/up 1 flight steps but has to ambulate 2 feet to same step using BUE on same rail   TODAY'S TREATMENT:  12/25/23 THERAPEUTIC EXERCISE: To improve strength and ROM.  Demonstration, verbal and tactile cues throughout for technique. Bike some partial ROM, some full RPM x 10'  seat level 8  THERAPEUTIC ACTIVITIES: To improve functional performance.  Demonstration, verbal and tactile cues throughout for technique. Standing gastroc stretch on step 1' RLE Step lunge into step 10x5 RLE Step ups 6' RLE x 20  Lateral step ups 6' RLE x 20 Heel raise drop from 6' step  x20 Calf stretch step hang x 1' BLE Seated LAQ 4# w/ 2-3 sec holds x 3/10 RLE Prone knee flexion 4# x 2/10 RLE SKTC knee flexion stretch x 1' x 2 w/ 4# ankle wt   NEUROMUSCULAR RE-EDUCATION: To improve balance. Sidestepping GTB at knees 1/2 lap in gym x 4 Monster walk GTB at knees 1/2 lap x 2 SLS RLE x 1' PNF knee flexion stretch in prone with contract-relax-contract  Vasopneumatic to R knee at min compression x 10' at 34 degrees.  12/21/23 THERAPEUTIC EXERCISE: To improve strength and ROM.  Demonstration, verbal and tactile cues throughout for technique. Bike some partial ROM, some full RPM x 8' Standing gastroc stretch on step 1' RLE Step lunge into step 10x5 RLE THERAPEUTIC ACTIVITIES: To improve functional performance.   Demonstration, verbal and tactile cues throughout for technique. Step ups 6' RLE x 20  Lateral step ups 6' RLE x 20 Heel raise drop from 6' step  x20 Squats with UE support 2x10 Vasopneumatic to R knee at min compression x 10' at 34 degrees. GAIT TRAINING: With [facility] cues to increase step length and to avoid circumduction during swing phase  12/19/23 THERAPEUTIC EXERCISE: To improve strength and ROM.  Demonstration, verbal and tactile cues throughout for technique. Bike some partial ROM, some full RPM x 8'  THERAPEUTIC ACTIVITIES: To improve functional performance.  Demonstration, verbal and tactile cues throughout for technique. Aerobic step calf stretch x 1' x 2 RLE Aerobic step lunge x 20 Aerobic step up F x 2/10; lateral x 2/10 RLE Lateral lunge on RLE x 2/10 Standing hip ext RTB x 20 RLE Standing hip abd RTB x 20 RLE  Seated knee curls RTB x 20 RLE Seated hip flexor stretch x 1' Seated LAQ 2# x 2/20 RLE Prone knee extension x 20 RLE Prone knee flexion stretch w/ PT assist x 1'  NEUROMUSCULAR RE-EDUCATION: To improve balance. Standing unsupported:  Toe/heel rocking x 2/20   Minisquat x 2/20   Marching x 2/20 BLE  Vasopneumatic to R knee at min compression x 15' at 34 degrees.   12/15/23 THERAPEUTIC EXERCISE: To improve ROM.  Demonstration, verbal and tactile cues throughout for technique. Bike partial ROM  x 8'  THERAPEUTIC ACTIVITIES: To improve functional performance.  Demonstration, verbal and tactile cues throughout for technique. Aerobic step calf stretch x 1' x 2 Aerobic step lunges x 20 Step ups F x 10; lateral x 10 RLE Standing:  toe to heel rocking no UE support x 20 Minisquats x 20  Marching 3# x 20 Hip abd 3# x 20 Hip ext 3# x 20 Knee flexion 3# x 20 LAQ 3# X 20 Prone knee extension 3# x 20 Prone knee flexion 0# x 20 Prone PNF flexion stretching contract-relax-contract for knee flexion x 4' SKTC flexion stretching with PNF C-R-C x 4'  MANUAL THERAPY:  To promote improved flexibility, increased ROM, and reduced pain utilizing myofascial release and scar mobilization MFR to in SL to R IT band x 5' Scar massage cross friction and j-hooks x 2'  Vasopneumatic to R knee at min compression x 15' at 34 degrees.   12/13/23 THERAPEUTIC EXERCISE: To improve ROM.  Demonstration, verbal and tactile cues throughout for technique. Bike partial ROM x 8' no tension  THERAPEUTIC ACTIVITIES: To improve functional performance.  Demonstration, verbal and tactile cues throughout for technique. Aerobic step calf stretch x 1' x 2  Aerobic step forward lunge in step stance x 20 Step ups forward x 10 RLE Step ups lateral x 2/10 RLE Seated LAQ x 20 w/ 2 sec holds x 20 RLE Seated sit n slide knee flexion sttretch x 1' x 2 RLE Prone reverse QS/knee extension x 20 Prone knee curls x 20 RLE Prone knee flexion PNF with CR and active flexion with PT assist SKTC flexion stretch x 1' x 2 RLE     12/11/23 Bike partial ROM L1 x 6'  Seated LAQ 2# x 3/10 Supine SLR 2# x 2/10 Supine SAQ 2# 2/10 QS x 20 HS x 20 SKTC flexion stretch x 1' x 2 RLE Prone knee flexion/curls x 20 RLE Prone knee flexion stretch x 1' x 2 RLE  Standing calf stretch x 1' x 2 RLE Standing toe raises no support x 20 Standing heel raises no support x 20 Minisquats no support x 20 Marching no support x 20 Aerobic step ups x 2/10 forward RTB TKE x 20  Vasopneumatic to R knee at min compression x 15' at 34 degrees.  12/08/23 Bike partial ROM L1 x 5' Gait with SPC with cueing and tactile cues for R knee flexion in swing, proper cane sequence Supine QS x 3/10 Supine SAQ x 3/10 SLR x 2/10 SKTC knee flexion stretch x 1' x 2 RLE   Standing unsupported toe raise, heel raise, minisquat, march BLE x 20 ea with CGA to min assist for balance, L weight shifting for 50/50% Standing calf stretch on foam x 1' x 2 RLE Standing supported hip ABD, hip ext, and knee flexion x 20 RLE Seated LAQ x  20 RLE Step stance rock/lunge F/B on RLE x 20 Standing RTB TKE x 20 RLE  12/06/23 Initial HEP, safety with gait with walker and cane, instruction for ice 3-4 x daily/15-30 min to R knee; keeping R kne straight when lying in bed without pillows; no submerging incision   PATIENT EDUCATION:  Education details: adding step ups at home to HEP, pushing on bike to get full ROM, pushing flexion stretches in [facility] and in prone w/ strap  Person educated: Patient Education method: Explanation, Demonstration, Verbal cues, Tactile cues, and MedBridgeGO app access provided Education comprehension: verbalized understanding, verbal cues required, tactile cues required, and  needs further education  HOME EXERCISE PROGRAM: Access Code: [email or web address] URL: [email or web address] Date: 12/06/2023 Prepared by: Garnette Montclair  Exercises - Supine Ankle Pumps  - 3 x daily - 7 x weekly - 1 sets - 10 reps - Supine Quad Set  - 3 x daily - 7 x weekly - 1 sets - 10 reps - Straight Leg Raise  - 3 x daily - 7 x weekly - 1 sets - 10 reps - Supine Heel Slides  - 3 x daily - 7 x weekly - 1 sets - 10 reps - Supine Hip Abduction  - 3 x daily - 7 x weekly - 1 sets - 10 reps - Seated Long Arc Quad  - 3 x daily - 7 x weekly - 1 sets - 10 reps - Seated Knee Flexion  - 3 x daily - 7 x weekly - 1 sets - 10 reps - Supine Knee Extension Strengthening  - 1 x daily - 7 x weekly - 3 sets - 10 reps - Standing Heel Raise with Support  - 1 x daily - 7 x weekly - 3 sets - 5 reps - Toe Raises with Counter Support  - 1 x daily - 7 x weekly - 3 sets - 5 reps - Mini Squats at Table  - 1 x daily - 7 x weekly - 3 sets - 5 reps - Standing Hip Flexion AROM  - 1 x daily - 7 x weekly - 3 sets - 5 reps - Standing Hip Abduction  - 1 x daily - 7 x weekly - 3 sets - 5 reps - Standing Hip Extension  - 1 x daily - 7 x weekly - 3 sets - 5 reps - Standing Knee Flexion AROM with Chair Support  - 1 x daily - 7 x weekly - 3 sets - 5  reps   ASSESSMENT:  CLINICAL IMPRESSION: Focus today on improving R knee flexion and pushing harder into knee flexion.  Knee flexion remains her greatest limitation at this point.   Strength is progressively improving.  Lateral step ups are improving, but eccentric control is not good yet.  Patient still has moderate R knee edema present  EVAL:  Ann Foster is a 58 y.o. female who was referred to physical therapy for evaluation and treatment following a R TKA by Dr Jerri 2 weeks ago.   Patient reports onset of R knee pain beginning at least a year ago. Pain is worse with R knee flexion and extension and with FWB unsupported on RLE.  Patient has deficits in R knee ROM, R LE flexibility, RLE strength, balance, safety, and HEP understanding which are interfering with ADLs and are impacting quality of life.  On LEFS patient scored 49/80 demonstrating 38.75% functional limitation.  Krisalyn will benefit from skilled PT to address above deficits to improve mobility and activity tolerance with decreased pain interference.  OBJECTIVE IMPAIRMENTS: Abnormal gait, decreased balance, decreased knowledge of use of DME, decreased mobility, difficulty walking, decreased ROM, decreased strength, decreased safety awareness, increased edema, increased muscle spasms, impaired flexibility, and pain.   ACTIVITY LIMITATIONS: carrying, lifting, bending, standing, squatting, sleeping, stairs, and locomotion level  PARTICIPATION LIMITATIONS: meal prep, cleaning, laundry, driving, shopping, and community activity  PERSONAL FACTORS: Age, Fitness, and 1-2 comorbidities: Bronchitis, Diabetes, Htn, OA are also affecting patient's functional outcome.   REHAB POTENTIAL: Good  CLINICAL DECISION MAKING: Evolving/moderate complexity  EVALUATION COMPLEXITY: Moderate   GOALS: Goals reviewed with patient? Yes  SHORT TERM GOALS:  Target date: 01/03/2024  Patient will be independent with initial HEP. Baseline: 100% PT assist  required for correct completion  Goal status: MET  2.  Patient will report at least 25% improvement in R knee pain to improve QOL. Baseline: 6/10 average, 8/10 worst 12/26/23--3/10 worst over last 5 days  Goal status: MET  LONG TERM GOALS: Target date: 01/31/2024  Patient will be independent with advanced/ongoing HEP to improve outcomes and carryover.  Baseline: no advanced HEP yet Goal status: IN PROGRESS  2.  Patient will report at least 50-75% improvement in R knee pain to improve QOL. Baseline: 6-8/10 Goal status: MET  3.  Patient will demonstrate improved R knee AROM to 0-120 degrees to allow for normal gait and stair mechanics. Baseline: Refer to above LE ROM table 12/26/23 : 0-100 degrees R knee Goal status: IN PROGRESS  4.  Patient will demonstrate improved R knee and RLE strength to >/= 4+/5 for improved stability and ease of mobility. Baseline: Refer to above LE MMT table Goal status: INITIAL  5.  Patient will be able to ambulate x 10-20 minutes with no device, and normal gait pattern without increased pain to access community for shopping Baseline: 200 feet with walker Goal status: INITIAL  6. Patient will be able to ascend/descend stairs with 1 HR and reciprocal step pattern safely to access home and community.  Baseline: both hands required and 2 foot on 1 step advancedment Goal status: INITIAL  7.  Patient will report >/= 60/80 on LEFS (MCID = 9 pts) to demonstrate improved functional ability. Baseline: 49  Goal status: INITIAL  8.  Patient will demonstrate at least 20/24 on DGI to decrease risk of falls. Baseline: 16/20 Goal status: INITIAL     PLAN:  PT FREQUENCY: 2x/week  PT DURATION: 8 weeks  PLANNED INTERVENTIONS: {97164- PT Re-evaluation, 97750- Physical Performance Testing, 97110-Therapeutic exercises, 97530- Therapeutic activity, 97112- Neuromuscular re-education, 97535- Self Care, [id number]- Manual therapy, [phone]- Gait training, [phone]- Electrical  stimulation (unattended), 97016- Vasopneumatic device, 20560 (1-2 muscles), 20561 (3+ muscles)- Dry Needling, Patient/Family education, Balance training, Stair training, Taping, Joint mobilization, DME instructions, Cryotherapy, Moist heat, and Biofeedback  PLAN FOR NEXT SESSION:  Continue to work on R knee flexion ROM with more aggressive stretching as patient tolerates; Try KT tape web pattern for edema  Gentry Seeber, PT 12/26/2023, 1:53 PM

## 2023-12-28 ENCOUNTER — Ambulatory Visit: Admitting: Rehabilitation

## 2023-12-28 DIAGNOSIS — R600 Localized edema: Secondary | ICD-10-CM

## 2023-12-28 DIAGNOSIS — M25561 Pain in right knee: Secondary | ICD-10-CM | POA: Diagnosis not present

## 2023-12-28 DIAGNOSIS — R262 Difficulty in walking, not elsewhere classified: Secondary | ICD-10-CM

## 2023-12-28 DIAGNOSIS — M6281 Muscle weakness (generalized): Secondary | ICD-10-CM

## 2023-12-28 DIAGNOSIS — M25661 Stiffness of right knee, not elsewhere classified: Secondary | ICD-10-CM

## 2023-12-28 NOTE — Therapy (Signed)
 OUTPATIENT PHYSICAL THERAPY LOWER EXTREMITY TREATMENT   Patient Name: Ann Foster MRN: [id number] DOB: 08-Nov-1965, 58 y.o., female Today's Date: 12/30/2023   END OF SESSION:        Past Medical History:  Diagnosis Date   Ankle fracture 2016   Right   Aortic atherosclerosis (HCC)    trace calcific atherosclerosis aortic arch per ct neck done 12-22-17   Bronchitis    Diabetes mellitus without complication (HCC)    Hypertension    OA (osteoarthritis) of shoulder    left shoulder, both knees arthritis   Osteoarthritis, knee    Sleep apnea    wears CPAP   Past Surgical History:  Procedure Laterality Date   CHOLECYSTECTOMY N/A 04/15/2023   Procedure: LAPAROSCOPIC CHOLECYSTECTOMY WITH ICG DYE;  Surgeon: Ebbie Cough, MD;  Location: [facility];  Service: General;  Laterality: N/A;   COLONOSCOPY     TOTAL KNEE ARTHROPLASTY Right 11/20/2023   Procedure: ARTHROPLASTY, KNEE, TOTAL RIGHT;  Surgeon: Jerri Kay HERO, MD;  Location: [facility];  Service: Orthopedics;  Laterality: Right;   Patient Active Problem List   Diagnosis Date Noted   Status post total right knee replacement 11/20/2023   Primary osteoarthritis of right knee 09/26/2023   Snoring 06/07/2023   Acute cholecystitis 04/13/2023   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, with long-term current use of insulin  (HCC) 04/25/2022   Amblyopia of eye, right 07/27/2021   Corneal guttata of left eye 07/27/2021   Influenza vaccine refused 07/29/2020   COVID-19 vaccine dose declined 07/29/2020   Vasomotor symptoms due to menopause 04/24/2020   Atrophic vaginitis 04/24/2020   Impingement syndrome of left shoulder 01/23/2018    PCP: Theotis Haze ORN, NP   REFERRING PROVIDER: Jerri Kay HERO, MD   REFERRING DIAG:  M17.11 (ICD-10-CM) - Primary osteoarthritis of right knee  THERAPY DIAG:  Acute pain of right knee  Stiffness of right knee, not elsewhere  classified  Difficulty in walking, not elsewhere classified  Muscle weakness (generalized)  Localized edema  RATIONALE FOR EVALUATION AND TREATMENT: Rehabilitation  ONSET DATE: 11/20/23  NEXT MD VISIT: 01/02/24 Dr Jerri.   SUBJECTIVE:                                                                                                                                                                                                         SUBJECTIVE STATEMENT: States was very sore after last visit and still in pain today.  Rates 4/10.  States the flexion stretching is what  increased her pain and she has not done any HEP since last visit due to pain    Patient referred to PT following  R TKA on by DR XU on 11/20/23.   She has had 2 weeks HH PT.     She is also c/o nerve pain in the R hip and anterolateral thigh that has been present since surgery and is attributed to the nerve block.  She had her bandage removed yesterday and has steri strips in place.  There is no redness/drainage/dehischence noted.  Calf edema is minimal.  Homan's is negative RLE.   Patient has end stage L knee OA as well and hopes to the L TKA done before the year is out once she is recovered from this surgery.  She has been walker dependent since surgery, but no falls.  PAIN: Are you having pain? Yes: NPRS scale: 3/10 Pain location: R knee Pain description: dull to sharp variably  Aggravating factors: end ROM flex/ext; prlonged walking Relieving factors: ice, meds, rest, elevation  PERTINENT HISTORY:  Bronchitis, Diabetes, Htn, OA  PRECAUTIONS: None  RED FLAGS: None  WEIGHT BEARING RESTRICTIONS: No  FALLS:  Has patient fallen in last 6 months? No  LIVING ENVIRONMENT: Lives with: lives with their family Lives in: House/apartment Stairs: Yes: External: 2 steps; none Has following equipment at home: Walker - 2 wheeled, shower chair, and bed side commode  OCCUPATION:  disabled  PLOF: Independentused cane some prior  to surgery due to pain, but normally independent with no device  PATIENT GOALS: walk normally without pain and get my other knee done   OBJECTIVE: (objective measures completed at initial evaluation unless otherwise dated)  PATIENT SURVEYS:  LEFS = 49/80  COGNITION: Overall cognitive status: Within functional limits for tasks assessed    SENSATION: WFL  EDEMA:  Circumferential: 42.5 cm on L knee; 44.5 cm on R knee 7/18: LLE =  43.5; RLE = 45 cm  POSTURE:  No Significant postural limitations  PALPATION: TTP over the medial and lateral R knee, along with R mid to distal IT band, and distal quads  MUSCLE LENGTH: Hamstrings: Right 80 deg; Left 90  deg Hamstrings: 90 degrees + SLR BLE ITB: mild tightness RLE Heelcord: mild tight RLE  LOWER EXTREMITY ROM:  Active ROM Right eval Left eval  Hip flexion    Hip extension    Hip abduction    Hip adduction    Hip internal rotation    Hip external rotation    Knee flexion 70 120  Knee extension -10 0  Ankle dorsiflexion 0 5  Ankle plantarflexion    Ankle inversion    Ankle eversion     Passive ROM Right eval Left eval  Hip flexion    Hip extension    Hip abduction    Hip adduction    Hip internal rotation    Hip external rotation    Knee flexion 80   Knee extension -6   Ankle dorsiflexion    Ankle plantarflexion    Ankle inversion    Ankle eversion    (Blank rows = not tested)  LOWER EXTREMITY MMT:  MMT Right eval Left eval  Hip flexion 4- 4+  Hip extension    Hip abduction 4- 4+  Hip adduction    Hip internal rotation 3+ 4  Hip external rotation 4- 5  Knee flexion 4+ 5  Knee extension 4- 5  Ankle dorsiflexion 4 4  Ankle plantarflexion    Ankle inversion  Ankle eversion     (Blank rows = not tested)   FUNCTIONAL TESTS:  5x STS = 22.41 TUG = 18.39 DGI =  16   GAIT: Distance walked: 200 feet into clinic Assistive device utilized: Walker - 2 wheeled Level of assistance: Complete  Independence Gait pattern: decreased stance time- Right and antalgic Comments: ambulates independently with FWW with decreased R knee flexion in swing, decreased stance time RLE with mild limp and decreased WB RLE;   can ambulate with a SPC but more pronounced limp and requires min gait belt assist for safety;   able to ambulate down/up 1 flight steps but has to ambulate 2 feet to same step using BUE on same rail   TODAY'S TREATMENT:  12/28/23 THERAPEUTIC EXERCISE: To improve strength and ROM.  Demonstration, verbal and tactile cues throughout for technique. Bike some partial ROM, some full RPM x 6'seat level 8  THERAPEUTIC ACTIVITIES: To improve functional performance.  Demonstration, verbal and tactile cues throughout for technique. Knee extension 15# x 3/10 RLE Knee flexion 20# x 3/10 RLE Sidestepping GTB around ankles x 34' x 4 Standing hip ext 4# x 2/10 RLE Standing knee flexion 4# x 2/10 RLE  NEUROMUSCULAR RE-EDUCATION: To improve balance. Airex standing eyes closed x 1' Airex toe/heel raises x 30 Airex marching w/ turning 360 x 2 laps CW and CCW  MANUAL THERAPY: To promote reduced pain and reduced edema utilizing kinesiotaping. KT tape to R knee 2 I strip web pattern from medial and lateral covered by 2 more I strips for chondromaleacea pattern  Vasopneumatic to R knee at min compression x 10' at 34 degrees.  12/25/23 THERAPEUTIC ACTIVITIES: To improve functional performance.  Demonstration, verbal and tactile cues throughout for technique. Standing gastroc stretch on step 1' RLE Step lunge into step 10x5 RLE Step ups 6' RLE x 20  Lateral step ups 6' RLE x 20 Heel raise drop from 6' step  x20 Calf stretch step hang x 1' BLE Seated LAQ 4# w/ 2-3 sec holds x 3/10 RLE Prone knee flexion 4# x 2/10 RLE SKTC knee flexion stretch x 1' x 2 w/ 4# ankle wt   NEUROMUSCULAR RE-EDUCATION: To improve balance. Sidestepping GTB at knees 1/2 lap in gym x 4 Monster walk GTB at knees 1/2  lap x 2 SLS RLE x 1' PNF knee flexion stretch in prone with contract-relax-contract  Vasopneumatic to R knee at min compression x 10' at 34 degrees.  12/21/23 THERAPEUTIC EXERCISE: To improve strength and ROM.  Demonstration, verbal and tactile cues throughout for technique. Bike some partial ROM, some full RPM x 8' Standing gastroc stretch on step 1' RLE Step lunge into step 10x5 RLE THERAPEUTIC ACTIVITIES: To improve functional performance.  Demonstration, verbal and tactile cues throughout for technique. Step ups 6' RLE x 20  Lateral step ups 6' RLE x 20 Heel raise drop from 6' step  x20 Squats with UE support 2x10 Vasopneumatic to R knee at min compression x 10' at 34 degrees. GAIT TRAINING: With [facility] cues to increase step length and to avoid circumduction during swing phase  12/19/23 THERAPEUTIC EXERCISE: To improve strength and ROM.  Demonstration, verbal and tactile cues throughout for technique. Bike some partial ROM, some full RPM x 8'  THERAPEUTIC ACTIVITIES: To improve functional performance.  Demonstration, verbal and tactile cues throughout for technique. Aerobic step calf stretch x 1' x 2 RLE Aerobic step lunge x 20 Aerobic step up F x 2/10; lateral x 2/10 RLE Lateral lunge  on RLE x 2/10 Standing hip ext RTB x 20 RLE Standing hip abd RTB x 20 RLE  Seated knee curls RTB x 20 RLE Seated hip flexor stretch x 1' Seated LAQ 2# x 2/20 RLE Prone knee extension x 20 RLE Prone knee flexion stretch w/ PT assist x 1'  NEUROMUSCULAR RE-EDUCATION: To improve balance. Standing unsupported:  Toe/heel rocking x 2/20   Minisquat x 2/20   Marching x 2/20 BLE  Vasopneumatic to R knee at min compression x 15' at 34 degrees.   12/15/23 THERAPEUTIC EXERCISE: To improve ROM.  Demonstration, verbal and tactile cues throughout for technique. Bike partial ROM x 8'  THERAPEUTIC ACTIVITIES: To improve functional performance.  Demonstration, verbal and tactile cues throughout for  technique. Aerobic step calf stretch x 1' x 2 Aerobic step lunges x 20 Step ups F x 10; lateral x 10 RLE Standing:  toe to heel rocking no UE support x 20 Minisquats x 20  Marching 3# x 20 Hip abd 3# x 20 Hip ext 3# x 20 Knee flexion 3# x 20 LAQ 3# X 20 Prone knee extension 3# x 20 Prone knee flexion 0# x 20 Prone PNF flexion stretching contract-relax-contract for knee flexion x 4' SKTC flexion stretching with PNF C-R-C x 4'  MANUAL THERAPY: To promote improved flexibility, increased ROM, and reduced pain utilizing myofascial release and scar mobilization MFR to in SL to R IT band x 5' Scar massage cross friction and j-hooks x 2'  Vasopneumatic to R knee at min compression x 15' at 34 degrees.   12/13/23 THERAPEUTIC EXERCISE: To improve ROM.  Demonstration, verbal and tactile cues throughout for technique. Bike partial ROM x 8' no tension  THERAPEUTIC ACTIVITIES: To improve functional performance.  Demonstration, verbal and tactile cues throughout for technique. Aerobic step calf stretch x 1' x 2  Aerobic step forward lunge in step stance x 20 Step ups forward x 10 RLE Step ups lateral x 2/10 RLE Seated LAQ x 20 w/ 2 sec holds x 20 RLE Seated sit n slide knee flexion sttretch x 1' x 2 RLE Prone reverse QS/knee extension x 20 Prone knee curls x 20 RLE Prone knee flexion PNF with CR and active flexion with PT assist SKTC flexion stretch x 1' x 2 RLE     12/11/23 Bike partial ROM L1 x 6'  Seated LAQ 2# x 3/10 Supine SLR 2# x 2/10 Supine SAQ 2# 2/10 QS x 20 HS x 20 SKTC flexion stretch x 1' x 2 RLE Prone knee flexion/curls x 20 RLE Prone knee flexion stretch x 1' x 2 RLE  Standing calf stretch x 1' x 2 RLE Standing toe raises no support x 20 Standing heel raises no support x 20 Minisquats no support x 20 Marching no support x 20 Aerobic step ups x 2/10 forward RTB TKE x 20  Vasopneumatic to R knee at min compression x 15' at 34 degrees.  12/08/23 Bike partial  ROM L1 x 5' Gait with SPC with cueing and tactile cues for R knee flexion in swing, proper cane sequence Supine QS x 3/10 Supine SAQ x 3/10 SLR x 2/10 SKTC knee flexion stretch x 1' x 2 RLE   Standing unsupported toe raise, heel raise, minisquat, march BLE x 20 ea with CGA to min assist for balance, L weight shifting for 50/50% Standing calf stretch on foam x 1' x 2 RLE Standing supported hip ABD, hip ext, and knee flexion x 20 RLE Seated LAQ  x 20 RLE Step stance rock/lunge F/B on RLE x 20 Standing RTB TKE x 20 RLE  12/06/23 Initial HEP, safety with gait with walker and cane, instruction for ice 3-4 x daily/15-30 min to R knee; keeping R kne straight when lying in bed without pillows; no submerging incision   PATIENT EDUCATION:  Education details: adding step ups at home to HEP, pushing on bike to get full ROM, pushing flexion stretches in [facility] and in prone w/ strap  Person educated: Patient Education method: Explanation, Demonstration, Verbal cues, Tactile cues, and MedBridgeGO app access provided Education comprehension: verbalized understanding, verbal cues required, tactile cues required, and needs further education  HOME EXERCISE PROGRAM: Access Code: [email or web address] URL: [email or web address] Date: 12/06/2023 Prepared by: Garnette Montclair  Exercises - Supine Ankle Pumps  - 3 x daily - 7 x weekly - 1 sets - 10 reps - Supine Quad Set  - 3 x daily - 7 x weekly - 1 sets - 10 reps - Straight Leg Raise  - 3 x daily - 7 x weekly - 1 sets - 10 reps - Supine Heel Slides  - 3 x daily - 7 x weekly - 1 sets - 10 reps - Supine Hip Abduction  - 3 x daily - 7 x weekly - 1 sets - 10 reps - Seated Long Arc Quad  - 3 x daily - 7 x weekly - 1 sets - 10 reps - Seated Knee Flexion  - 3 x daily - 7 x weekly - 1 sets - 10 reps - Supine Knee Extension Strengthening  - 1 x daily - 7 x weekly - 3 sets - 10 reps - Standing Heel Raise with Support  - 1 x daily - 7 x weekly - 3 sets - 5  reps - Toe Raises with Counter Support  - 1 x daily - 7 x weekly - 3 sets - 5 reps - Mini Squats at Table  - 1 x daily - 7 x weekly - 3 sets - 5 reps - Standing Hip Flexion AROM  - 1 x daily - 7 x weekly - 3 sets - 5 reps - Standing Hip Abduction  - 1 x daily - 7 x weekly - 3 sets - 5 reps - Standing Hip Extension  - 1 x daily - 7 x weekly - 3 sets - 5 reps - Standing Knee Flexion AROM with Chair Support  - 1 x daily - 7 x weekly - 3 sets - 5 reps   ASSESSMENT:  CLINICAL IMPRESSION: 12/28/23  Patient is very sore today from prior PT visit.  Added KT tape web pattern and chondromalacea pattern over top to see if pain and edema will improve.   Advised patient to obtain ACE wrap and we will teach her to wrap her knee for edema control next visit.  She is still lacking knee flexion and this is her main deficit.   Unfortunately she has considerable pain when working on flexion.   We will continue to try to work in stretching into knee flexion each visit as able.  EVAL:  Ann Foster is a 58 y.o. female who was referred to physical therapy for evaluation and treatment following a R TKA by Dr Jerri 2 weeks ago.   Patient reports onset of R knee pain beginning at least a year ago. Pain is worse with R knee flexion and extension and with FWB unsupported on RLE.  Patient has deficits in R knee ROM, R LE flexibility,  RLE strength, balance, safety, and HEP understanding which are interfering with ADLs and are impacting quality of life.  On LEFS patient scored 49/80 demonstrating 38.75% functional limitation.  Ann Foster will benefit from skilled PT to address above deficits to improve mobility and activity tolerance with decreased pain interference.  OBJECTIVE IMPAIRMENTS: Abnormal gait, decreased balance, decreased knowledge of use of DME, decreased mobility, difficulty walking, decreased ROM, decreased strength, decreased safety awareness, increased edema, increased muscle spasms, impaired flexibility, and pain.    ACTIVITY LIMITATIONS: carrying, lifting, bending, standing, squatting, sleeping, stairs, and locomotion level  PARTICIPATION LIMITATIONS: meal prep, cleaning, laundry, driving, shopping, and community activity  PERSONAL FACTORS: Age, Fitness, and 1-2 comorbidities: Bronchitis, Diabetes, Htn, OA are also affecting patient's functional outcome.   REHAB POTENTIAL: Good  CLINICAL DECISION MAKING: Evolving/moderate complexity  EVALUATION COMPLEXITY: Moderate   GOALS: Goals reviewed with patient? Yes  SHORT TERM GOALS: Target date: 01/03/2024  Patient will be independent with initial HEP. Baseline: 100% PT assist required for correct completion  Goal status: MET  2.  Patient will report at least 25% improvement in R knee pain to improve QOL. Baseline: 6/10 average, 8/10 worst 12/26/23--3/10 worst over last 5 days  Goal status: MET  LONG TERM GOALS: Target date: 01/31/2024  Patient will be independent with advanced/ongoing HEP to improve outcomes and carryover.  Baseline: no advanced HEP yet Goal status: IN PROGRESS  2.  Patient will report at least 50-75% improvement in R knee pain to improve QOL. Baseline: 6-8/10 Goal status: MET  3.  Patient will demonstrate improved R knee AROM to 0-120 degrees to allow for normal gait and stair mechanics. Baseline: Refer to above LE ROM table 12/26/23 : 0-100 degrees R knee Goal status: IN PROGRESS  4.  Patient will demonstrate improved R knee and RLE strength to >/= 4+/5 for improved stability and ease of mobility. Baseline: Refer to above LE MMT table Goal status: INITIAL  5.  Patient will be able to ambulate x 10-20 minutes with no device, and normal gait pattern without increased pain to access community for shopping Baseline: 200 feet with walker Goal status: INITIAL  6. Patient will be able to ascend/descend stairs with 1 HR and reciprocal step pattern safely to access home and community.  Baseline: both hands required and 2 foot  on 1 step advancedment Goal status: INITIAL  7.  Patient will report >/= 60/80 on LEFS (MCID = 9 pts) to demonstrate improved functional ability. Baseline: 49  Goal status: INITIAL  8.  Patient will demonstrate at least 20/24 on DGI to decrease risk of falls. Baseline: 16/20 Goal status: INITIAL     PLAN:  PT FREQUENCY: 2x/week  PT DURATION: 8 weeks  PLANNED INTERVENTIONS: {97164- PT Re-evaluation, 97750- Physical Performance Testing, 97110-Therapeutic exercises, 97530- Therapeutic activity, [id number]- Neuromuscular re-education, 97535- Self Care, [id number]- Manual therapy, [phone]- Gait training, [phone]- Electrical stimulation (unattended), 97016- Vasopneumatic device, 20560 (1-2 muscles), 20561 (3+ muscles)- Dry Needling, Patient/Family education, Balance training, Stair training, Taping, Joint mobilization, DME instructions, Cryotherapy, Moist heat, and Biofeedback  PLAN FOR NEXT SESSION:  Assess if KT tape helped.  Continue to try to work on knee flexion as much as pain allows.  Continue to encourage pushing flexion stretching at home   Katisha Shimizu, PT 12/30/2023, 8:23 PM

## 2024-01-02 ENCOUNTER — Other Ambulatory Visit (INDEPENDENT_AMBULATORY_CARE_PROVIDER_SITE_OTHER)

## 2024-01-02 ENCOUNTER — Ambulatory Visit (INDEPENDENT_AMBULATORY_CARE_PROVIDER_SITE_OTHER): Admitting: Physician Assistant

## 2024-01-02 DIAGNOSIS — Z96651 Presence of right artificial knee joint: Secondary | ICD-10-CM

## 2024-01-02 NOTE — Progress Notes (Signed)
 Post-Op Visit Note   Patient: Ann Foster           Date of Birth: 04/06/1966           MRN: [id number] Visit Date: 01/02/2024 PCP: Theotis Haze ORN, NP   Assessment & Plan:  Chief Complaint:  Chief Complaint  Patient presents with   Right Knee - Follow-up    Right total knee arthroplasty 11/20/2023   Visit Diagnoses:  1. Status post total right knee replacement     Plan: Patient is a pleasant 58 year old female who comes in today 6 weeks status post right total knee replacement 11/20/2023.  She has been doing well.  Minimal pain.  She is taking Tylenol  as needed.  She has been taking baby aspirin twice daily for DVT prophylaxis.  She has been getting outpatient PT and is currently ambulating with a cane.  Examination of the right knee reveals a fully healed surgical scar without complication.  Range of motion from 0 to 95 degrees.  She is stable to valgus varus stress.  She is neurovascularly intact distally.  At this point, she may discontinue taking her baby aspirin from our standpoint.  She will continue with PT.  She will follow-up with us  in 6 weeks for recheck.  Call with concerns or questions in the meantime.  Follow-Up Instructions: Return in about 6 weeks (around 02/13/2024).   Orders:  Orders Placed This Encounter  Procedures   XR Knee 1-2 Views Right   No orders of the defined types were placed in this encounter.   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, with long-term current use of insulin  (HCC) 04/25/2022   Amblyopia of eye, right 07/27/2021   Corneal guttata of left  eye 07/27/2021   Influenza vaccine refused 07/29/2020   COVID-19 vaccine dose declined 07/29/2020   Vasomotor symptoms due to menopause 04/24/2020   Atrophic vaginitis 04/24/2020   Impingement syndrome of left shoulder 01/23/2018   Past Medical History:  Diagnosis Date   Ankle fracture 2016   Right   Aortic atherosclerosis (HCC)    trace calcific atherosclerosis aortic arch per ct neck done 12-22-17   Bronchitis    Diabetes mellitus without complication (HCC)    Hypertension    OA (osteoarthritis) of shoulder    left shoulder, both knees arthritis   Osteoarthritis, knee    Sleep apnea    wears CPAP    Family History  Problem Relation Age of Onset   Breast cancer Mother    Colon cancer Father 76   Diabetes Son    Esophageal cancer Neg Hx    Stomach cancer Neg Hx    Liver disease Neg Hx    Pancreatic cancer Neg Hx    Rectal cancer Neg Hx     Past Surgical History:  Procedure Laterality Date   CHOLECYSTECTOMY N/A 04/15/2023   Procedure: LAPAROSCOPIC CHOLECYSTECTOMY WITH ICG DYE;  Surgeon: Ebbie Cough, MD;  Location: [facility];  Service: General;  Laterality: N/A;   COLONOSCOPY     TOTAL KNEE ARTHROPLASTY Right 11/20/2023   Procedure: ARTHROPLASTY, KNEE, TOTAL RIGHT;  Surgeon: Jerri Kay HERO,  MD;  Location: [facility];  Service: Orthopedics;  Laterality: Right;   Social History   Occupational History   Not on file  Tobacco Use   Smoking status: Former    Current packs/day: 0.00    Types: Cigarettes    Quit date: 11/28/2019    Years since quitting: 4.0   Smokeless tobacco: Never  Vaping Use   Vaping status: Never Used  Substance and Sexual Activity   Alcohol use: Not Currently   Drug use: No   Sexual activity: Not Currently    Birth control/protection: Condom

## 2024-01-03 ENCOUNTER — Ambulatory Visit: Attending: Orthopaedic Surgery

## 2024-01-03 DIAGNOSIS — M25661 Stiffness of right knee, not elsewhere classified: Secondary | ICD-10-CM | POA: Diagnosis present

## 2024-01-03 DIAGNOSIS — R262 Difficulty in walking, not elsewhere classified: Secondary | ICD-10-CM | POA: Diagnosis present

## 2024-01-03 DIAGNOSIS — M6281 Muscle weakness (generalized): Secondary | ICD-10-CM | POA: Insufficient documentation

## 2024-01-03 DIAGNOSIS — M25561 Pain in right knee: Secondary | ICD-10-CM | POA: Diagnosis present

## 2024-01-03 DIAGNOSIS — R600 Localized edema: Secondary | ICD-10-CM | POA: Diagnosis present

## 2024-01-03 NOTE — Therapy (Signed)
 OUTPATIENT PHYSICAL THERAPY LOWER EXTREMITY TREATMENT   Patient Name: Ann Foster MRN: [id number] DOB: 06-25-1965, 58 y.o., female Today's Date: 01/03/2024   END OF SESSION:  PT End of Session - 01/03/24 1155     Visit Number 10    Date for PT Re-Evaluation 01/31/24    Authorization Type [employer] Dual complete    PT Start Time 1101    PT Stop Time 1152    PT Time Calculation (min) 51 min    Activity Tolerance No increased pain;Patient tolerated treatment well    Behavior During Therapy [facility] for tasks assessed/performed               Past Medical History:  Diagnosis Date   Ankle fracture 2016   Right   Aortic atherosclerosis (HCC)    trace calcific atherosclerosis aortic arch per ct neck done 12-22-17   Bronchitis    Diabetes mellitus without complication (HCC)    Hypertension    OA (osteoarthritis) of shoulder    left shoulder, both knees arthritis   Osteoarthritis, knee    Sleep apnea    wears CPAP   Past Surgical History:  Procedure Laterality Date   CHOLECYSTECTOMY N/A 04/15/2023   Procedure: LAPAROSCOPIC CHOLECYSTECTOMY WITH ICG DYE;  Surgeon: Ebbie Cough, MD;  Location: [facility];  Service: General;  Laterality: N/A;   COLONOSCOPY     TOTAL KNEE ARTHROPLASTY Right 11/20/2023   Procedure: ARTHROPLASTY, KNEE, TOTAL RIGHT;  Surgeon: Jerri Kay HERO, MD;  Location: [facility];  Service: Orthopedics;  Laterality: Right;   Patient Active Problem List   Diagnosis Date Noted   Status post total right knee replacement 11/20/2023   Primary osteoarthritis of right knee 09/26/2023   Snoring 06/07/2023   Acute cholecystitis 04/13/2023   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, with long-term current use of insulin  (HCC) 04/25/2022   Amblyopia of eye, right 07/27/2021   Corneal guttata of left eye 07/27/2021   Influenza vaccine refused 07/29/2020   COVID-19 vaccine dose declined 07/29/2020   Vasomotor  symptoms due to menopause 04/24/2020   Atrophic vaginitis 04/24/2020   Impingement syndrome of left shoulder 01/23/2018    PCP: Theotis Haze ORN, NP   REFERRING PROVIDER: Jerri Kay HERO, MD   REFERRING DIAG:  M17.11 (ICD-10-CM) - Primary osteoarthritis of right knee  THERAPY DIAG:  Acute pain of right knee  Stiffness of right knee, not elsewhere classified  Difficulty in walking, not elsewhere classified  Muscle weakness (generalized)  Localized edema  RATIONALE FOR EVALUATION AND TREATMENT: Rehabilitation  ONSET DATE: 11/20/23  NEXT MD VISIT: 01/02/24 Dr Jerri.   SUBJECTIVE:  SUBJECTIVE STATEMENT:  Pt reports a little sore, sat in car with un adjustable seated this morning. Dr. Jerri said that she could end PT whenever she wants  Patient referred to PT following  R TKA on by DR XU on 11/20/23.   She has had 2 weeks HH PT.     She is also c/o nerve pain in the R hip and anterolateral thigh that has been present since surgery and is attributed to the nerve block.  She had her bandage removed yesterday and has steri strips in place.  There is no redness/drainage/dehischence noted.  Calf edema is minimal.  Homan's is negative RLE.   Patient has end stage L knee OA as well and hopes to the L TKA done before the year is out once she is recovered from this surgery.  She has been walker dependent since surgery, but no falls.  PAIN: Are you having pain? Yes: NPRS scale: 3/10 Pain location: R knee Pain description: dull to sharp variably  Aggravating factors: end ROM flex/ext; prlonged walking Relieving factors: ice, meds, rest, elevation  PERTINENT HISTORY:  Bronchitis, Diabetes, Htn, OA  PRECAUTIONS: None  RED FLAGS: None  WEIGHT BEARING RESTRICTIONS: No  FALLS:  Has patient fallen in  last 6 months? No  LIVING ENVIRONMENT: Lives with: lives with their family Lives in: House/apartment Stairs: Yes: External: 2 steps; none Has following equipment at home: Walker - 2 wheeled, shower chair, and bed side commode  OCCUPATION:  disabled  PLOF: Independentused cane some prior to surgery due to pain, but normally independent with no device  PATIENT GOALS: walk normally without pain and get my other knee done   OBJECTIVE: (objective measures completed at initial evaluation unless otherwise dated)  PATIENT SURVEYS:  LEFS = 49/80  COGNITION: Overall cognitive status: Within functional limits for tasks assessed    SENSATION: WFL  EDEMA:  Circumferential: 42.5 cm on L knee; 44.5 cm on R knee 7/18: LLE =  43.5; RLE = 45 cm  POSTURE:  No Significant postural limitations  PALPATION: TTP over the medial and lateral R knee, along with R mid to distal IT band, and distal quads  MUSCLE LENGTH: Hamstrings: Right 80 deg; Left 90  deg Hamstrings: 90 degrees + SLR BLE ITB: mild tightness RLE Heelcord: mild tight RLE  LOWER EXTREMITY ROM:  Active ROM Right eval Left eval R 01/03/24  Hip flexion     Hip extension     Hip abduction     Hip adduction     Hip internal rotation     Hip external rotation     Knee flexion 70 120 108  Knee extension -10 0   Ankle dorsiflexion 0 5   Ankle plantarflexion     Ankle inversion     Ankle eversion      Passive ROM Right eval Left eval  Hip flexion    Hip extension    Hip abduction    Hip adduction    Hip internal rotation    Hip external rotation    Knee flexion 80   Knee extension -6   Ankle dorsiflexion    Ankle plantarflexion    Ankle inversion    Ankle eversion    (Blank rows = not tested)  LOWER EXTREMITY MMT:  MMT Right eval Left eval R 01/03/24  Hip flexion 4- 4+ 5  Hip extension     Hip abduction 4- 4+ 4+  Hip adduction     Hip internal rotation 3+  4 4  Hip external rotation 4- 5 4+  Knee flexion 4+ 5  4+  Knee extension 4- 5 4+  Ankle dorsiflexion 4 4   Ankle plantarflexion     Ankle inversion     Ankle eversion      (Blank rows = not tested)   FUNCTIONAL TESTS:  5x STS = 22.41 TUG = 18.39 DGI =  16   GAIT: Distance walked: 200 feet into clinic Assistive device utilized: Walker - 2 wheeled Level of assistance: Complete Independence Gait pattern: decreased stance time- Right and antalgic Comments: ambulates independently with FWW with decreased R knee flexion in swing, decreased stance time RLE with mild limp and decreased WB RLE;   can ambulate with a SPC but more pronounced limp and requires min gait belt assist for safety;   able to ambulate down/up 1 flight steps but has to ambulate 2 feet to same step using BUE on same rail   TODAY'S TREATMENT:  01/03/24 THERAPEUTIC EXERCISE: To improve strength and ROM.  Demonstration, verbal and tactile cues throughout for technique. Bike some partial ROM, some full RPM x 8'seat level 8 Knee flexion 20lb 2x10 BLE; 10lb x 10 R/L Knee extension 10lb 2x10 BLE; 5lb x 10 R/L  MANUAL THERAPY: To promote reduced pain and reduced edema utilizing kinesiotaping. KT tape to R knee 2 I strips for chondromalacia pattern  THERAPEUTIC ACTIVITIES: To improve functional performance.  Demonstration, verbal and tactile cues throughout for technique. Stairs: ascending/descending - uses momentum ascending w/ R LE; turns sideways going down leading with L MMT  [facility] PT Assessment - 01/03/24 0001       Standardized Balance Assessment   Standardized Balance Assessment Dynamic Gait Index      Dynamic Gait Index   Level Surface Mild Impairment    Change in Gait Speed Normal    Gait with Horizontal Head Turns Normal    Gait with Vertical Head Turns Normal    Gait and Pivot Turn Normal    Step Over Obstacle Mild Impairment    Step Around Obstacles Normal    Steps Moderate Impairment    Total Score 20          12/28/23 THERAPEUTIC EXERCISE: To  improve strength and ROM.  Demonstration, verbal and tactile cues throughout for technique. Bike some partial ROM, some full RPM x 6'seat level 8  THERAPEUTIC ACTIVITIES: To improve functional performance.  Demonstration, verbal and tactile cues throughout for technique. Knee extension 15# x 3/10 RLE Knee flexion 20# x 3/10 RLE Sidestepping GTB around ankles x 34' x 4 Standing hip ext 4# x 2/10 RLE Standing knee flexion 4# x 2/10 RLE  NEUROMUSCULAR RE-EDUCATION: To improve balance. Airex standing eyes closed x 1' Airex toe/heel raises x 30 Airex marching w/ turning 360 x 2 laps CW and CCW  MANUAL THERAPY: To promote reduced pain and reduced edema utilizing kinesiotaping. KT tape to R knee 2 I strip web pattern from medial and lateral covered by 2 more I strips for chondromaleacea pattern  Vasopneumatic to R knee at min compression x 10' at 34 degrees.  12/25/23 THERAPEUTIC ACTIVITIES: To improve functional performance.  Demonstration, verbal and tactile cues throughout for technique. Standing gastroc stretch on step 1' RLE Step lunge into step 10x5 RLE Step ups 6' RLE x 20  Lateral step ups 6' RLE x 20 Heel raise drop from 6' step  x20 Calf stretch step hang x 1' BLE Seated LAQ 4# w/ 2-3 sec holds x  3/10 RLE Prone knee flexion 4# x 2/10 RLE SKTC knee flexion stretch x 1' x 2 w/ 4# ankle wt   NEUROMUSCULAR RE-EDUCATION: To improve balance. Sidestepping GTB at knees 1/2 lap in gym x 4 Monster walk GTB at knees 1/2 lap x 2 SLS RLE x 1' PNF knee flexion stretch in prone with contract-relax-contract  Vasopneumatic to R knee at min compression x 10' at 34 degrees.  12/21/23 THERAPEUTIC EXERCISE: To improve strength and ROM.  Demonstration, verbal and tactile cues throughout for technique. Bike some partial ROM, some full RPM x 8' Standing gastroc stretch on step 1' RLE Step lunge into step 10x5 RLE THERAPEUTIC ACTIVITIES: To improve functional performance.  Demonstration,  verbal and tactile cues throughout for technique. Step ups 6' RLE x 20  Lateral step ups 6' RLE x 20 Heel raise drop from 6' step  x20 Squats with UE support 2x10 Vasopneumatic to R knee at min compression x 10' at 34 degrees. GAIT TRAINING: With [facility] cues to increase step length and to avoid circumduction during swing phase  12/19/23 THERAPEUTIC EXERCISE: To improve strength and ROM.  Demonstration, verbal and tactile cues throughout for technique. Bike some partial ROM, some full RPM x 8'  THERAPEUTIC ACTIVITIES: To improve functional performance.  Demonstration, verbal and tactile cues throughout for technique. Aerobic step calf stretch x 1' x 2 RLE Aerobic step lunge x 20 Aerobic step up F x 2/10; lateral x 2/10 RLE Lateral lunge on RLE x 2/10 Standing hip ext RTB x 20 RLE Standing hip abd RTB x 20 RLE  Seated knee curls RTB x 20 RLE Seated hip flexor stretch x 1' Seated LAQ 2# x 2/20 RLE Prone knee extension x 20 RLE Prone knee flexion stretch w/ PT assist x 1'  NEUROMUSCULAR RE-EDUCATION: To improve balance. Standing unsupported:  Toe/heel rocking x 2/20   Minisquat x 2/20   Marching x 2/20 BLE  Vasopneumatic to R knee at min compression x 15' at 34 degrees.   12/15/23 THERAPEUTIC EXERCISE: To improve ROM.  Demonstration, verbal and tactile cues throughout for technique. Bike partial ROM x 8'  THERAPEUTIC ACTIVITIES: To improve functional performance.  Demonstration, verbal and tactile cues throughout for technique. Aerobic step calf stretch x 1' x 2 Aerobic step lunges x 20 Step ups F x 10; lateral x 10 RLE Standing:  toe to heel rocking no UE support x 20 Minisquats x 20  Marching 3# x 20 Hip abd 3# x 20 Hip ext 3# x 20 Knee flexion 3# x 20 LAQ 3# X 20 Prone knee extension 3# x 20 Prone knee flexion 0# x 20 Prone PNF flexion stretching contract-relax-contract for knee flexion x 4' SKTC flexion stretching with PNF C-R-C x 4'  MANUAL THERAPY: To promote  improved flexibility, increased ROM, and reduced pain utilizing myofascial release and scar mobilization MFR to in SL to R IT band x 5' Scar massage cross friction and j-hooks x 2'  Vasopneumatic to R knee at min compression x 15' at 34 degrees.   12/13/23 THERAPEUTIC EXERCISE: To improve ROM.  Demonstration, verbal and tactile cues throughout for technique. Bike partial ROM x 8' no tension  THERAPEUTIC ACTIVITIES: To improve functional performance.  Demonstration, verbal and tactile cues throughout for technique. Aerobic step calf stretch x 1' x 2  Aerobic step forward lunge in step stance x 20 Step ups forward x 10 RLE Step ups lateral x 2/10 RLE Seated LAQ x 20 w/ 2 sec holds x 20  RLE Seated sit n slide knee flexion sttretch x 1' x 2 RLE Prone reverse QS/knee extension x 20 Prone knee curls x 20 RLE Prone knee flexion PNF with CR and active flexion with PT assist SKTC flexion stretch x 1' x 2 RLE     12/11/23 Bike partial ROM L1 x 6'  Seated LAQ 2# x 3/10 Supine SLR 2# x 2/10 Supine SAQ 2# 2/10 QS x 20 HS x 20 SKTC flexion stretch x 1' x 2 RLE Prone knee flexion/curls x 20 RLE Prone knee flexion stretch x 1' x 2 RLE  Standing calf stretch x 1' x 2 RLE Standing toe raises no support x 20 Standing heel raises no support x 20 Minisquats no support x 20 Marching no support x 20 Aerobic step ups x 2/10 forward RTB TKE x 20  Vasopneumatic to R knee at min compression x 15' at 34 degrees.  12/08/23 Bike partial ROM L1 x 5' Gait with SPC with cueing and tactile cues for R knee flexion in swing, proper cane sequence Supine QS x 3/10 Supine SAQ x 3/10 SLR x 2/10 SKTC knee flexion stretch x 1' x 2 RLE   Standing unsupported toe raise, heel raise, minisquat, march BLE x 20 ea with CGA to min assist for balance, L weight shifting for 50/50% Standing calf stretch on foam x 1' x 2 RLE Standing supported hip ABD, hip ext, and knee flexion x 20 RLE Seated LAQ x 20 RLE Step  stance rock/lunge F/B on RLE x 20 Standing RTB TKE x 20 RLE  12/06/23 Initial HEP, safety with gait with walker and cane, instruction for ice 3-4 x daily/15-30 min to R knee; keeping R kne straight when lying in bed without pillows; no submerging incision   PATIENT EDUCATION:  Education details: adding step ups at home to HEP, pushing on bike to get full ROM, pushing flexion stretches in [facility] and in prone w/ strap  Person educated: Patient Education method: Explanation, Demonstration, Verbal cues, Tactile cues, and MedBridgeGO app access provided Education comprehension: verbalized understanding, verbal cues required, tactile cues required, and needs further education  HOME EXERCISE PROGRAM: Access Code: [email or web address] URL: [email or web address] Date: 12/06/2023 Prepared by: Garnette Montclair  Exercises - Supine Ankle Pumps  - 3 x daily - 7 x weekly - 1 sets - 10 reps - Supine Quad Set  - 3 x daily - 7 x weekly - 1 sets - 10 reps - Straight Leg Raise  - 3 x daily - 7 x weekly - 1 sets - 10 reps - Supine Heel Slides  - 3 x daily - 7 x weekly - 1 sets - 10 reps - Supine Hip Abduction  - 3 x daily - 7 x weekly - 1 sets - 10 reps - Seated Long Arc Quad  - 3 x daily - 7 x weekly - 1 sets - 10 reps - Seated Knee Flexion  - 3 x daily - 7 x weekly - 1 sets - 10 reps - Supine Knee Extension Strengthening  - 1 x daily - 7 x weekly - 3 sets - 10 reps - Standing Heel Raise with Support  - 1 x daily - 7 x weekly - 3 sets - 5 reps - Toe Raises with Counter Support  - 1 x daily - 7 x weekly - 3 sets - 5 reps - Mini Squats at Table  - 1 x daily - 7 x weekly - 3 sets - 5 reps - Standing  Hip Flexion AROM  - 1 x daily - 7 x weekly - 3 sets - 5 reps - Standing Hip Abduction  - 1 x daily - 7 x weekly - 3 sets - 5 reps - Standing Hip Extension  - 1 x daily - 7 x weekly - 3 sets - 5 reps - Standing Knee Flexion AROM with Chair Support  - 1 x daily - 7 x weekly - 3 sets - 5  reps   ASSESSMENT:  CLINICAL IMPRESSION: Pt progressing very well, showing improved strength and knee flexion ROM. She has met goal for DGI and partially met goal for LE strength. She is showing difficulty with stair navigation with standard height steps w/o compensation (see treatment for specifics). She did see benefit from KT tape so we did that again for stability purposes. She declined vaso post session today. She continues to benefit from skilled therapy to continue addressing ongoing deficits.  EVAL:  Ann Foster is a 58 y.o. female who was referred to physical therapy for evaluation and treatment following a R TKA by Dr Jerri 2 weeks ago.   Patient reports onset of R knee pain beginning at least a year ago. Pain is worse with R knee flexion and extension and with FWB unsupported on RLE.  Patient has deficits in R knee ROM, R LE flexibility, RLE strength, balance, safety, and HEP understanding which are interfering with ADLs and are impacting quality of life.  On LEFS patient scored 49/80 demonstrating 38.75% functional limitation.  Eilyn will benefit from skilled PT to address above deficits to improve mobility and activity tolerance with decreased pain interference.  OBJECTIVE IMPAIRMENTS: Abnormal gait, decreased balance, decreased knowledge of use of DME, decreased mobility, difficulty walking, decreased ROM, decreased strength, decreased safety awareness, increased edema, increased muscle spasms, impaired flexibility, and pain.   ACTIVITY LIMITATIONS: carrying, lifting, bending, standing, squatting, sleeping, stairs, and locomotion level  PARTICIPATION LIMITATIONS: meal prep, cleaning, laundry, driving, shopping, and community activity  PERSONAL FACTORS: Age, Fitness, and 1-2 comorbidities: Bronchitis, Diabetes, Htn, OA are also affecting patient's functional outcome.   REHAB POTENTIAL: Good  CLINICAL DECISION MAKING: Evolving/moderate complexity  EVALUATION COMPLEXITY:  Moderate   GOALS: Goals reviewed with patient? Yes  SHORT TERM GOALS: Target date: 01/03/2024  Patient will be independent with initial HEP. Baseline: 100% PT assist required for correct completion  Goal status: MET  2.  Patient will report at least 25% improvement in R knee pain to improve QOL. Baseline: 6/10 average, 8/10 worst 12/26/23--3/10 worst over last 5 days  Goal status: MET  LONG TERM GOALS: Target date: 01/31/2024  Patient will be independent with advanced/ongoing HEP to improve outcomes and carryover.  Baseline: no advanced HEP yet Goal status: IN PROGRESS- 01/03/24 met for current HEP  2.  Patient will report at least 50-75% improvement in R knee pain to improve QOL. Baseline: 6-8/10 Goal status: MET  3.  Patient will demonstrate improved R knee AROM to 0-120 degrees to allow for normal gait and stair mechanics. Baseline: Refer to above LE ROM table 12/26/23 : 0-100 degrees R knee Goal status: IN PROGRESS- 01/03/24 108 knee flexion  4.  Patient will demonstrate improved R knee and RLE strength to >/= 4+/5 for improved stability and ease of mobility. Baseline: Refer to above LE MMT table Goal status: PARTIALLY MET- 01/03/24 see table  5.  Patient will be able to ambulate x 10-20 minutes with no device, and normal gait pattern without increased pain to access  community for shopping Baseline: 200 feet with walker Goal status: IN PROGRESS  6. Patient will be able to ascend/descend stairs with 1 HR and reciprocal step pattern safely to access home and community.  Baseline: both hands required and 2 foot on 1 step advancedment Goal status: IN PROGRESS- 01/03/24 see treatment  7.  Patient will report >/= 60/80 on LEFS (MCID = 9 pts) to demonstrate improved functional ability. Baseline: 49  Goal status: IN PROGRESS  8.  Patient will demonstrate at least 20/24 on DGI to decrease risk of falls. Baseline: 16/20 Goal status: MET- 01/03/24      PLAN:  PT FREQUENCY:  2x/week  PT DURATION: 8 weeks  PLANNED INTERVENTIONS: {02835- PT Re-evaluation, 97750- Physical Performance Testing, 97110-Therapeutic exercises, 97530- Therapeutic activity, 97112- Neuromuscular re-education, 97535- Self Care, [id number]- Manual therapy, [phone]- Gait training, [phone]- Electrical stimulation (unattended), 97016- Vasopneumatic device, [phone] (1-2 muscles), 20561 (3+ muscles)- Dry Needling, Patient/Family education, Balance training, Stair training, Taping, Joint mobilization, DME instructions, Cryotherapy, Moist heat, and Biofeedback  PLAN FOR NEXT SESSION: LEFS; assess LTG #5; Work on eccentric step downs, step ups on 8' step; Continue to try to work on knee flexion as much as pain allows.   Tiquan Bouch L Rusti Arizmendi, PTA 01/03/2024, 12:05 PM

## 2024-01-05 ENCOUNTER — Ambulatory Visit: Admitting: Rehabilitation

## 2024-01-05 DIAGNOSIS — M25561 Pain in right knee: Secondary | ICD-10-CM

## 2024-01-05 DIAGNOSIS — R600 Localized edema: Secondary | ICD-10-CM

## 2024-01-05 DIAGNOSIS — R262 Difficulty in walking, not elsewhere classified: Secondary | ICD-10-CM

## 2024-01-05 DIAGNOSIS — M25661 Stiffness of right knee, not elsewhere classified: Secondary | ICD-10-CM

## 2024-01-05 DIAGNOSIS — M6281 Muscle weakness (generalized): Secondary | ICD-10-CM

## 2024-01-05 NOTE — Therapy (Signed)
 OUTPATIENT PHYSICAL THERAPY LOWER EXTREMITY TREATMENT   Patient Name: Ann Foster MRN: [id number] DOB: [birth year], 58 y.o., female Today's Date: 01/05/2024   END OF SESSION:  PT End of Session - 01/05/24 1043     Visit Number 11    Date for PT Re-Evaluation 01/31/24    Authorization Type [employer] Dual complete    PT Start Time 0958    PT Stop Time 1100    PT Time Calculation (min) 62 min    Activity Tolerance No increased pain;Patient tolerated treatment well    Behavior During Therapy [facility] for tasks assessed/performed          Past Medical History:  Diagnosis Date   Ankle fracture 2016   Right   Aortic atherosclerosis (HCC)    trace calcific atherosclerosis aortic arch per ct neck done 12-22-17   Bronchitis    Diabetes mellitus without complication (HCC)    Hypertension    OA (osteoarthritis) of shoulder    left shoulder, both knees arthritis   Osteoarthritis, knee    Sleep apnea    wears CPAP   Past Surgical History:  Procedure Laterality Date   CHOLECYSTECTOMY N/A 04/15/2023   Procedure: LAPAROSCOPIC CHOLECYSTECTOMY WITH ICG DYE;  Surgeon: Ebbie Cough, MD;  Location: [facility];  Service: General;  Laterality: N/A;   COLONOSCOPY     TOTAL KNEE ARTHROPLASTY Right 11/20/2023   Procedure: ARTHROPLASTY, KNEE, TOTAL RIGHT;  Surgeon: Jerri Kay HERO, MD;  Location: [facility] OR;  Service: Orthopedics;  Laterality: Right;   Patient Active Problem List   Diagnosis Date Noted   Status post total right knee replacement 11/20/2023   Primary osteoarthritis of right knee 09/26/2023   Snoring 06/07/2023   Acute cholecystitis 04/13/2023   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, with long-term current use of insulin  (HCC) 04/25/2022   Amblyopia of eye, right 07/27/2021   Corneal guttata of left eye 07/27/2021   Influenza vaccine refused 07/29/2020   COVID-19 vaccine dose declined 07/29/2020   Vasomotor symptoms due to  menopause 04/24/2020   Atrophic vaginitis 04/24/2020   Impingement syndrome of left shoulder 01/23/2018    PCP: Theotis Haze ORN, NP   REFERRING PROVIDER: Jerri Kay HERO, MD   REFERRING DIAG:  M17.11 (ICD-10-CM) - Primary osteoarthritis of right knee  THERAPY DIAG:  Acute pain of right knee  Stiffness of right knee, not elsewhere classified  Difficulty in walking, not elsewhere classified  Muscle weakness (generalized)  Localized edema  RATIONALE FOR EVALUATION AND TREATMENT: Rehabilitation  ONSET DATE: 11/20/23  NEXT MD VISIT: 01/02/24 Dr Jerri.   SUBJECTIVE:  SUBJECTIVE STATEMENT: Patient states she feels ok today.  States has been working on bike and steps at home more over the last week.  Pain level today is 0/10 much of the time but 3/10 with exercises.      Patient referred to PT following  R TKA on by DR XU on 11/20/23.   She has had 2 weeks HH PT.     She is also c/o nerve pain in the R hip and anterolateral thigh that has been present since surgery and is attributed to the nerve block.  She had her bandage removed yesterday and has steri strips in place.  There is no redness/drainage/dehischence noted.  Calf edema is minimal.  Homan's is negative RLE.   Patient has end stage L knee OA as well and hopes to the L TKA done before the year is out once she is recovered from this surgery.  She has been walker dependent since surgery, but no falls.  PAIN: Are you having pain? Yes: NPRS scale: 3/10 Pain location: R knee Pain description: dull to sharp variably  Aggravating factors: end ROM flex/ext; prlonged walking Relieving factors: ice, meds, rest, elevation  PERTINENT HISTORY:  Bronchitis, Diabetes, Htn, OA  PRECAUTIONS: None  RED FLAGS: None  WEIGHT BEARING RESTRICTIONS:  No  FALLS:  Has patient fallen in last 6 months? No  LIVING ENVIRONMENT: Lives with: lives with their family Lives in: House/apartment Stairs: Yes: External: 2 steps; none Has following equipment at home: Walker - 2 wheeled, shower chair, and bed side commode  OCCUPATION:  disabled  PLOF: Independentused cane some prior to surgery due to pain, but normally independent with no device  PATIENT GOALS: walk normally without pain and get my other knee done   OBJECTIVE: (objective measures completed at initial evaluation unless otherwise dated)  PATIENT SURVEYS:  LEFS = 49/80  COGNITION: Overall cognitive status: Within functional limits for tasks assessed    SENSATION: WFL  EDEMA:  Circumferential: 42.5 cm on L knee; 44.5 cm on R knee 7/18: LLE =  43.5; RLE = 45 cm  POSTURE:  No Significant postural limitations  PALPATION: TTP over the medial and lateral R knee, along with R mid to distal IT band, and distal quads  MUSCLE LENGTH: Hamstrings: Right 80 deg; Left 90  deg Hamstrings: 90 degrees + SLR BLE ITB: mild tightness RLE Heelcord: mild tight RLE  LOWER EXTREMITY ROM:  Active ROM Right eval Left eval R 01/03/24  Hip flexion     Hip extension     Hip abduction     Hip adduction     Hip internal rotation     Hip external rotation     Knee flexion 70 120 108  Knee extension -10 0 0  Ankle dorsiflexion 0 5   Ankle plantarflexion     Ankle inversion     Ankle eversion      Passive ROM Right eval Left eval  Hip flexion    Hip extension    Hip abduction    Hip adduction    Hip internal rotation    Hip external rotation    Knee flexion 80   Knee extension -6   Ankle dorsiflexion    Ankle plantarflexion    Ankle inversion    Ankle eversion    (Blank rows = not tested)  LOWER EXTREMITY MMT:  MMT Right eval Left eval R 01/03/24  Hip flexion 4- 4+ 5  Hip extension     Hip abduction  4- 4+ 4+  Hip adduction     Hip internal rotation 3+ 4 4  Hip external  rotation 4- 5 4+  Knee flexion 4+ 5 4+  Knee extension 4- 5 4+  Ankle dorsiflexion 4 4   Ankle plantarflexion     Ankle inversion     Ankle eversion      (Blank rows = not tested)   FUNCTIONAL TESTS:  5x STS = 22.41 TUG = 18.39 DGI =  16   GAIT: Distance walked: 200 feet into clinic Assistive device utilized: Walker - 2 wheeled Level of assistance: Complete Independence Gait pattern: decreased stance time- Right and antalgic Comments: ambulates independently with FWW with decreased R knee flexion in swing, decreased stance time RLE with mild limp and decreased WB RLE;   can ambulate with a SPC but more pronounced limp and requires min gait belt assist for safety;   able to ambulate down/up 1 flight steps but has to ambulate 2 feet to same step using BUE on same rail   TODAY'S TREATMENT:  01/05/24 THERAPEUTIC EXERCISE: To improve strength and ROM.  Demonstration, verbal and tactile cues throughout for technique. Bike  full RPM x 8'seat level 8  NEUROMUSCULAR RE-EDUCATION: To improve balance. Up/down 1 flight steps x 3 with CGA with 1 rail and CGA for reciprocal pattern, not stumbling when stepping down LLE Ladder/foam obstacle course Large foam roll step over and back x 20 BUE  THERAPEUTIC ACTIVITIES: To improve functional performance.  Demonstration, verbal and tactile cues throughout for technique. Seated cone touch for accelator to brake motions for driving x 20 RLE Step hang calf stretch x 1' x 2 BLE Seated knee extension 5# x 3/10 RLE Seated knee flexion 20# x 3/10 BLE Seated heel prop QS x 20 RLE Supine SKTC flexion stretch x 1' x 3  RLE Supine thomas hip flexor stretch x 1' x 2 RLE Supine HS to end range x 20 RLE  CP x 10' R knee   01/03/24 THERAPEUTIC EXERCISE: To improve strength and ROM.  Demonstration, verbal and tactile cues throughout for technique. Bike some partial ROM, some full RPM x 8'seat level 8 Knee flexion 20lb 2x10 BLE; 10lb x 10 R/L Knee extension  10lb 2x10 BLE; 5lb x 10 R/L  MANUAL THERAPY: To promote reduced pain and reduced edema utilizing kinesiotaping. KT tape to R knee 2 I strips for chondromalacia pattern  THERAPEUTIC ACTIVITIES: To improve functional performance.  Demonstration, verbal and tactile cues throughout for technique. Stairs: ascending/descending - uses momentum ascending w/ R LE; turns sideways going down leading with L MMT  [facility] PT Assessment - 01/03/24 0001       Standardized Balance Assessment   Standardized Balance Assessment Dynamic Gait Index      Dynamic Gait Index   Level Surface Mild Impairment    Change in Gait Speed Normal    Gait with Horizontal Head Turns Normal    Gait with Vertical Head Turns Normal    Gait and Pivot Turn Normal    Step Over Obstacle Mild Impairment    Step Around Obstacles Normal    Steps Moderate Impairment    Total Score 20          12/28/23 THERAPEUTIC EXERCISE: To improve strength and ROM.  Demonstration, verbal and tactile cues throughout for technique. Bike some partial ROM, some full RPM x 6'seat level 8  THERAPEUTIC ACTIVITIES: To improve functional performance.  Demonstration, verbal and tactile cues throughout for technique. Knee extension  15# x 3/10 RLE Knee flexion 20# x 3/10 RLE Sidestepping GTB around ankles x 34' x 4 Standing hip ext 4# x 2/10 RLE Standing knee flexion 4# x 2/10 RLE  NEUROMUSCULAR RE-EDUCATION: To improve balance. Airex standing eyes closed x 1' Airex toe/heel raises x 30 Airex marching w/ turning 360 x 2 laps CW and CCW  MANUAL THERAPY: To promote reduced pain and reduced edema utilizing kinesiotaping. KT tape to R knee 2 I strip web pattern from medial and lateral covered by 2 more I strips for chondromaleacea pattern  Vasopneumatic to R knee at min compression x 10' at 34 degrees.  12/25/23 THERAPEUTIC ACTIVITIES: To improve functional performance.  Demonstration, verbal and tactile cues throughout for technique. Standing  gastroc stretch on step 1' RLE Step lunge into step 10x5 RLE Step ups 6' RLE x 20  Lateral step ups 6' RLE x 20 Heel raise drop from 6' step  x20 Calf stretch step hang x 1' BLE Seated LAQ 4# w/ 2-3 sec holds x 3/10 RLE Prone knee flexion 4# x 2/10 RLE SKTC knee flexion stretch x 1' x 2 w/ 4# ankle wt   NEUROMUSCULAR RE-EDUCATION: To improve balance. Sidestepping GTB at knees 1/2 lap in gym x 4 Monster walk GTB at knees 1/2 lap x 2 SLS RLE x 1' PNF knee flexion stretch in prone with contract-relax-contract  Vasopneumatic to R knee at min compression x 10' at 34 degrees.  12/21/23 THERAPEUTIC EXERCISE: To improve strength and ROM.  Demonstration, verbal and tactile cues throughout for technique. Bike some partial ROM, some full RPM x 8' Standing gastroc stretch on step 1' RLE Step lunge into step 10x5 RLE THERAPEUTIC ACTIVITIES: To improve functional performance.  Demonstration, verbal and tactile cues throughout for technique. Step ups 6' RLE x 20  Lateral step ups 6' RLE x 20 Heel raise drop from 6' step  x20 Squats with UE support 2x10 Vasopneumatic to R knee at min compression x 10' at 34 degrees. GAIT TRAINING: With [place] cues to increase step length and to avoid circumduction during swing phase  PATIENT EDUCATION:  Education details: stair training sequence doing reciprocal pattern and breaking old habit of 1 step at a time; not using SPC at home now; working on walking inclined surfaces at home.    Person educated: Patient Education method: Explanation, Demonstration, Verbal cues, Tactile cues, and MedBridgeGO app access provided Education comprehension: verbalized understanding, verbal cues required, tactile cues required, and needs further education  HOME EXERCISE PROGRAM: Access Code: [email or web address] URL: [email or web address] Date: 12/06/2023 Prepared by: Garnette Montclair  Exercises - Supine Ankle Pumps  - 3 x daily - 7 x weekly - 1 sets - 10 reps -  Supine Quad Set  - 3 x daily - 7 x weekly - 1 sets - 10 reps - Straight Leg Raise  - 3 x daily - 7 x weekly - 1 sets - 10 reps - Supine Heel Slides  - 3 x daily - 7 x weekly - 1 sets - 10 reps - Supine Hip Abduction  - 3 x daily - 7 x weekly - 1 sets - 10 reps - Seated Long Arc Quad  - 3 x daily - 7 x weekly - 1 sets - 10 reps - Seated Knee Flexion  - 3 x daily - 7 x weekly - 1 sets - 10 reps - Supine Knee Extension Strengthening  - 1 x daily - 7 x weekly - 3 sets - 10 reps - Standing  Heel Raise with Support  - 1 x daily - 7 x weekly - 3 sets - 5 reps - Toe Raises with Counter Support  - 1 x daily - 7 x weekly - 3 sets - 5 reps - Mini Squats at Table  - 1 x daily - 7 x weekly - 3 sets - 5 reps - Standing Hip Flexion AROM  - 1 x daily - 7 x weekly - 3 sets - 5 reps - Standing Hip Abduction  - 1 x daily - 7 x weekly - 3 sets - 5 reps - Standing Hip Extension  - 1 x daily - 7 x weekly - 3 sets - 5 reps - Standing Knee Flexion AROM with Chair Support  - 1 x daily - 7 x weekly - 3 sets - 5 reps   ASSESSMENT:  CLINICAL IMPRESSION: Patient is able to get full revolutions consistently on the bike which a major accomplishment for her.   As of last week she could only do 1-2 full revolutions with significant pain and then had to return to partial RPMs.  Last week has been the most improvement in knee flexion that she has seen over the course of her rehab.  She continues to use the cane with gait, but is ready to progress away from it now.     EVAL:  Madalee Altmann is a 58 y.o. female who was referred to physical therapy for evaluation and treatment following a R TKA by Dr Jerri 2 weeks ago.   Patient reports onset of R knee pain beginning at least a year ago. Pain is worse with R knee flexion and extension and with FWB unsupported on RLE.  Patient has deficits in R knee ROM, R LE flexibility, RLE strength, balance, safety, and HEP understanding which are interfering with ADLs and are impacting quality of  life.  On LEFS patient scored 49/80 demonstrating 38.75% functional limitation.  Yanelis will benefit from skilled PT to address above deficits to improve mobility and activity tolerance with decreased pain interference.  OBJECTIVE IMPAIRMENTS: Abnormal gait, decreased balance, decreased knowledge of use of DME, decreased mobility, difficulty walking, decreased ROM, decreased strength, decreased safety awareness, increased edema, increased muscle spasms, impaired flexibility, and pain.   ACTIVITY LIMITATIONS: carrying, lifting, bending, standing, squatting, sleeping, stairs, and locomotion level  PARTICIPATION LIMITATIONS: meal prep, cleaning, laundry, driving, shopping, and community activity  PERSONAL FACTORS: Age, Fitness, and 1-2 comorbidities: Bronchitis, Diabetes, Htn, OA are also affecting patient's functional outcome.   REHAB POTENTIAL: Good  CLINICAL DECISION MAKING: Evolving/moderate complexity  EVALUATION COMPLEXITY: Moderate   GOALS: Goals reviewed with patient? Yes  SHORT TERM GOALS: Target date: 01/03/2024  Patient will be independent with initial HEP. Baseline: 100% PT assist required for correct completion  Goal status: MET  2.  Patient will report at least 25% improvement in R knee pain to improve QOL. Baseline: 6/10 average, 8/10 worst 12/26/23--3/10 worst over last 5 days  Goal status: MET  LONG TERM GOALS: Target date: 01/31/2024  Patient will be independent with advanced/ongoing HEP to improve outcomes and carryover.  Baseline: no advanced HEP yet Goal status: IN PROGRESS- 01/03/24 met for current HEP  2.  Patient will report at least 50-75% improvement in R knee pain to improve QOL. Baseline: 6-8/10 Goal status: MET  3.  Patient will demonstrate improved R knee AROM to 0-120 degrees to allow for normal gait and stair mechanics. Baseline: Refer to above LE ROM table 12/26/23 : 0-100 degrees  R knee Goal status: IN PROGRESS- 01/03/24 108 knee flexion  4.  Patient  will demonstrate improved R knee and RLE strength to >/= 4+/5 for improved stability and ease of mobility. Baseline: Refer to above LE MMT table Goal status: PARTIALLY MET- 01/03/24 see table  5.  Patient will be able to ambulate x 10-20 minutes with no device, and normal gait pattern without increased pain to access community for shopping Baseline: 200 feet with walker Goal status: IN PROGRESS  6. Patient will be able to ascend/descend stairs with 1 HR and reciprocal step pattern safely to access home and community.  Baseline: both hands required and 2 foot on 1 step advancedment Goal status: IN PROGRESS- 01/03/24 see treatment  7.  Patient will report >/= 60/80 on LEFS (MCID = 9 pts) to demonstrate improved functional ability. Baseline: 49  Goal status: IN PROGRESS  8.  Patient will demonstrate at least 20/24 on DGI to decrease risk of falls. Baseline: 16/20 Goal status: MET- 01/03/24      PLAN:  PT FREQUENCY: 2x/week  PT DURATION: 8 weeks  PLANNED INTERVENTIONS: {97164- PT Re-evaluation, 97750- Physical Performance Testing, 97110-Therapeutic exercises, 97530- Therapeutic activity, 97112- Neuromuscular re-education, 97535- Self Care, 02859- Manual therapy, [phone]- Gait training, [phone]- Electrical stimulation (unattended), 97016- Vasopneumatic device, 20560 (1-2 muscles), 20561 (3+ muscles)- Dry Needling, Patient/Family education, Balance training, Stair training, Taping, Joint mobilization, DME instructions, Cryotherapy, Moist heat, and Biofeedback  PLAN FOR NEXT SESSION: LEFS; TM walking for LTG #5; eccentric step downs, step ups on 8' step  Mckennon Zwart, PT 01/05/2024, 11:32 AM

## 2024-01-08 ENCOUNTER — Ambulatory Visit
Admission: RE | Admit: 2024-01-08 | Discharge: 2024-01-08 | Disposition: A | Discharge status: Home / Self Care | Source: Ambulatory Visit | Attending: Nurse Practitioner

## 2024-01-08 DIAGNOSIS — Z1231 Encounter for screening mammogram for malignant neoplasm of breast: Secondary | ICD-10-CM

## 2024-01-09 ENCOUNTER — Ambulatory Visit

## 2024-01-09 DIAGNOSIS — M6281 Muscle weakness (generalized): Secondary | ICD-10-CM

## 2024-01-09 DIAGNOSIS — M25561 Pain in right knee: Secondary | ICD-10-CM

## 2024-01-09 DIAGNOSIS — M25661 Stiffness of right knee, not elsewhere classified: Secondary | ICD-10-CM

## 2024-01-09 DIAGNOSIS — R600 Localized edema: Secondary | ICD-10-CM

## 2024-01-09 DIAGNOSIS — R262 Difficulty in walking, not elsewhere classified: Secondary | ICD-10-CM

## 2024-01-09 NOTE — Therapy (Addendum)
 OUTPATIENT PHYSICAL THERAPY LOWER EXTREMITY TREATMENT   Patient Name: Ann Foster MRN: [id number] DOB: 07/18/1965, 58 y.o., female Today's Date: 01/09/2024   END OF SESSION:    Past Medical History:  Diagnosis Date   Ankle fracture 2016   Right   Aortic atherosclerosis (HCC)    trace calcific atherosclerosis aortic arch per ct neck done 12-22-17   Bronchitis    Diabetes mellitus without complication (HCC)    Hypertension    OA (osteoarthritis) of shoulder    left shoulder, both knees arthritis   Osteoarthritis, knee    Sleep apnea    wears CPAP   Past Surgical History:  Procedure Laterality Date   CHOLECYSTECTOMY N/A 04/15/2023   Procedure: LAPAROSCOPIC CHOLECYSTECTOMY WITH ICG DYE;  Surgeon: Ebbie Cough, MD;  Location: [facility];  Service: General;  Laterality: N/A;   COLONOSCOPY     TOTAL KNEE ARTHROPLASTY Right 11/20/2023   Procedure: ARTHROPLASTY, KNEE, TOTAL RIGHT;  Surgeon: Jerri Kay HERO, MD;  Location: [facility];  Service: Orthopedics;  Laterality: Right;   Patient Active Problem List   Diagnosis Date Noted   Status post total right knee replacement 11/20/2023   Primary osteoarthritis of right knee 09/26/2023   Snoring 06/07/2023   Acute cholecystitis 04/13/2023   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, with long-term current use of insulin  (HCC) 04/25/2022   Amblyopia of eye, right 07/27/2021   Corneal guttata of left eye 07/27/2021   Influenza vaccine refused 07/29/2020   COVID-19 vaccine dose declined 07/29/2020   Vasomotor symptoms due to menopause 04/24/2020   Atrophic vaginitis 04/24/2020   Impingement syndrome of left shoulder 01/23/2018    PCP: Theotis Haze ORN, NP   REFERRING PROVIDER: Jerri Kay HERO, MD   REFERRING DIAG:  M17.11 (ICD-10-CM) - Primary osteoarthritis of right knee  THERAPY DIAG:  Acute pain of right knee  Stiffness of right knee, not elsewhere  classified  Difficulty in walking, not elsewhere classified  Muscle weakness (generalized)  Localized edema  RATIONALE FOR EVALUATION AND TREATMENT: Rehabilitation  ONSET DATE: 11/20/23  NEXT MD VISIT: 01/02/24 Dr Jerri.   SUBJECTIVE:                                                                                                                                                                                                         SUBJECTIVE STATEMENT: Pt reports L knee been bothering her, R knee not painful    Patient referred to PT following  R TKA on by DR JERRI  on 11/20/23.   She has had 2 weeks HH PT.     She is also c/o nerve pain in the R hip and anterolateral thigh that has been present since surgery and is attributed to the nerve block.  She had her bandage removed yesterday and has steri strips in place.  There is no redness/drainage/dehischence noted.  Calf edema is minimal.  Homan's is negative RLE.   Patient has end stage L knee OA as well and hopes to the L TKA done before the year is out once she is recovered from this surgery.  She has been walker dependent since surgery, but no falls.  PAIN: Are you having pain? Yes: NPRS scale: 3/10 Pain location: R knee Pain description: dull to sharp variably  Aggravating factors: end ROM flex/ext; prlonged walking Relieving factors: ice, meds, rest, elevation  PERTINENT HISTORY:  Bronchitis, Diabetes, Htn, OA  PRECAUTIONS: None  RED FLAGS: None  WEIGHT BEARING RESTRICTIONS: No  FALLS:  Has patient fallen in last 6 months? No  LIVING ENVIRONMENT: Lives with: lives with their family Lives in: House/apartment Stairs: Yes: External: 2 steps; none Has following equipment at home: Walker - 2 wheeled, shower chair, and bed side commode  OCCUPATION:  disabled  PLOF: Independentused cane some prior to surgery due to pain, but normally independent with no device  PATIENT GOALS: walk normally without pain and get my other knee  done   OBJECTIVE: (objective measures completed at initial evaluation unless otherwise dated)  PATIENT SURVEYS:  LEFS = 49/80  COGNITION: Overall cognitive status: Within functional limits for tasks assessed    SENSATION: WFL  EDEMA:  Circumferential: 42.5 cm on L knee; 44.5 cm on R knee 7/18: LLE =  43.5; RLE = 45 cm  POSTURE:  No Significant postural limitations  PALPATION: TTP over the medial and lateral R knee, along with R mid to distal IT band, and distal quads  MUSCLE LENGTH: Hamstrings: Right 80 deg; Left 90  deg Hamstrings: 90 degrees + SLR BLE ITB: mild tightness RLE Heelcord: mild tight RLE  LOWER EXTREMITY ROM:  Active ROM Right eval Left eval R 01/03/24  Hip flexion     Hip extension     Hip abduction     Hip adduction     Hip internal rotation     Hip external rotation     Knee flexion 70 120 108  Knee extension -10 0 0  Ankle dorsiflexion 0 5   Ankle plantarflexion     Ankle inversion     Ankle eversion      Passive ROM Right eval Left eval  Hip flexion    Hip extension    Hip abduction    Hip adduction    Hip internal rotation    Hip external rotation    Knee flexion 80   Knee extension -6   Ankle dorsiflexion    Ankle plantarflexion    Ankle inversion    Ankle eversion    (Blank rows = not tested)  LOWER EXTREMITY MMT:  MMT Right eval Left eval R 01/03/24  Hip flexion 4- 4+ 5  Hip extension     Hip abduction 4- 4+ 4+  Hip adduction     Hip internal rotation 3+ 4 4  Hip external rotation 4- 5 4+  Knee flexion 4+ 5 4+  Knee extension 4- 5 4+  Ankle dorsiflexion 4 4   Ankle plantarflexion     Ankle inversion     Ankle  eversion      (Blank rows = not tested)   FUNCTIONAL TESTS:  5x STS = 22.41 TUG = 18.39 DGI =  16   GAIT: Distance walked: 200 feet into clinic Assistive device utilized: Walker - 2 wheeled Level of assistance: Complete Independence Gait pattern: decreased stance time- Right and antalgic Comments:  ambulates independently with FWW with decreased R knee flexion in swing, decreased stance time RLE with mild limp and decreased WB RLE;   can ambulate with a SPC but more pronounced limp and requires min gait belt assist for safety;   able to ambulate down/up 1 flight steps but has to ambulate 2 feet to same step using BUE on same rail   TODAY'S TREATMENT:  01/09/24 THERAPEUTIC EXERCISE: To improve strength and ROM.  Demonstration, verbal and tactile cues throughout for technique. Bike partial rev to full rev x 8 min Gait for 10 min 5 laps in long hallway Prone quad stretch 2x30 w/ strap Supine quad stretch w/ strap x 1 '  THERAPEUTIC ACTIVITIES: To improve functional performance.  Demonstration, verbal and tactile cues throughout for technique. Step ups 8' RLE 2x10 Eccentric step downs 6' x 10 - cues to avoid twisting hips Lateral step downs w/ heel tap 4' x 10  Vasopneumatic to R knee at min med compression x 10' at 34 degrees. 01/05/24 THERAPEUTIC EXERCISE: To improve strength and ROM.  Demonstration, verbal and tactile cues throughout for technique. Bike  full RPM x 8'seat level 8  NEUROMUSCULAR RE-EDUCATION: To improve balance. Up/down 1 flight steps x 3 with CGA with 1 rail and CGA for reciprocal pattern, not stumbling when stepping down LLE Ladder/foam obstacle course Large foam roll step over and back x 20 BUE  THERAPEUTIC ACTIVITIES: To improve functional performance.  Demonstration, verbal and tactile cues throughout for technique. Seated cone touch for accelator to brake motions for driving x 20 RLE Step hang calf stretch x 1' x 2 BLE Seated knee extension 5# x 3/10 RLE Seated knee flexion 20# x 3/10 BLE Seated heel prop QS x 20 RLE Supine SKTC flexion stretch x 1' x 3  RLE Supine thomas hip flexor stretch x 1' x 2 RLE Supine HS to end range x 20 RLE  CP x 10' R knee   01/03/24 THERAPEUTIC EXERCISE: To improve strength and ROM.  Demonstration, verbal and tactile cues  throughout for technique. Bike some partial ROM, some full RPM x 8'seat level 8 Knee flexion 20lb 2x10 BLE; 10lb x 10 R/L Knee extension 10lb 2x10 BLE; 5lb x 10 R/L  MANUAL THERAPY: To promote reduced pain and reduced edema utilizing kinesiotaping. KT tape to R knee 2 I strips for chondromalacia pattern  THERAPEUTIC ACTIVITIES: To improve functional performance.  Demonstration, verbal and tactile cues throughout for technique. Stairs: ascending/descending - uses momentum ascending w/ R LE; turns sideways going down leading with L MMT  [facility] PT Assessment - 01/03/24 0001       Standardized Balance Assessment   Standardized Balance Assessment Dynamic Gait Index      Dynamic Gait Index   Level Surface Mild Impairment    Change in Gait Speed Normal    Gait with Horizontal Head Turns Normal    Gait with Vertical Head Turns Normal    Gait and Pivot Turn Normal    Step Over Obstacle Mild Impairment    Step Around Obstacles Normal    Steps Moderate Impairment    Total Score 20  12/28/23 THERAPEUTIC EXERCISE: To improve strength and ROM.  Demonstration, verbal and tactile cues throughout for technique. Bike some partial ROM, some full RPM x 6'seat level 8  THERAPEUTIC ACTIVITIES: To improve functional performance.  Demonstration, verbal and tactile cues throughout for technique. Knee extension 15# x 3/10 RLE Knee flexion 20# x 3/10 RLE Sidestepping GTB around ankles x 34' x 4 Standing hip ext 4# x 2/10 RLE Standing knee flexion 4# x 2/10 RLE  NEUROMUSCULAR RE-EDUCATION: To improve balance. Airex standing eyes closed x 1' Airex toe/heel raises x 30 Airex marching w/ turning 360 x 2 laps CW and CCW  MANUAL THERAPY: To promote reduced pain and reduced edema utilizing kinesiotaping. KT tape to R knee 2 I strip web pattern from medial and lateral covered by 2 more I strips for chondromaleacea pattern  Vasopneumatic to R knee at min compression x 10' at 34  degrees.  12/25/23 THERAPEUTIC ACTIVITIES: To improve functional performance.  Demonstration, verbal and tactile cues throughout for technique. Standing gastroc stretch on step 1' RLE Step lunge into step 10x5 RLE Step ups 6' RLE x 20  Lateral step ups 6' RLE x 20 Heel raise drop from 6' step  x20 Calf stretch step hang x 1' BLE Seated LAQ 4# w/ 2-3 sec holds x 3/10 RLE Prone knee flexion 4# x 2/10 RLE SKTC knee flexion stretch x 1' x 2 w/ 4# ankle wt   NEUROMUSCULAR RE-EDUCATION: To improve balance. Sidestepping GTB at knees 1/2 lap in gym x 4 Monster walk GTB at knees 1/2 lap x 2 SLS RLE x 1' PNF knee flexion stretch in prone with contract-relax-contract  Vasopneumatic to R knee at min compression x 10' at 34 degrees.  12/21/23 THERAPEUTIC EXERCISE: To improve strength and ROM.  Demonstration, verbal and tactile cues throughout for technique. Bike some partial ROM, some full RPM x 8' Standing gastroc stretch on step 1' RLE Step lunge into step 10x5 RLE THERAPEUTIC ACTIVITIES: To improve functional performance.  Demonstration, verbal and tactile cues throughout for technique. Step ups 6' RLE x 20  Lateral step ups 6' RLE x 20 Heel raise drop from 6' step  x20 Squats with UE support 2x10 Vasopneumatic to R knee at min compression x 10' at 34 degrees. GAIT TRAINING: With [facility] cues to increase step length and to avoid circumduction during swing phase  PATIENT EDUCATION:  Education details: stair training sequence doing reciprocal pattern and breaking old habit of 1 step at a time; not using SPC at home now; working on walking inclined surfaces at home.    Person educated: Patient Education method: Explanation, Demonstration, Verbal cues, Tactile cues, and MedBridgeGO app access provided Education comprehension: verbalized understanding, verbal cues required, tactile cues required, and needs further education  HOME EXERCISE PROGRAM: Access Code: [email or web address] URL:  [email or web address] Date: 12/06/2023 Prepared by: Garnette Montclair  Exercises - Supine Ankle Pumps  - 3 x daily - 7 x weekly - 1 sets - 10 reps - Supine Quad Set  - 3 x daily - 7 x weekly - 1 sets - 10 reps - Straight Leg Raise  - 3 x daily - 7 x weekly - 1 sets - 10 reps - Supine Heel Slides  - 3 x daily - 7 x weekly - 1 sets - 10 reps - Supine Hip Abduction  - 3 x daily - 7 x weekly - 1 sets - 10 reps - Seated Long Arc Quad  - 3 x daily - 7 x weekly -  1 sets - 10 reps - Seated Knee Flexion  - 3 x daily - 7 x weekly - 1 sets - 10 reps - Supine Knee Extension Strengthening  - 1 x daily - 7 x weekly - 3 sets - 10 reps - Standing Heel Raise with Support  - 1 x daily - 7 x weekly - 3 sets - 5 reps - Toe Raises with Counter Support  - 1 x daily - 7 x weekly - 3 sets - 5 reps - Mini Squats at Table  - 1 x daily - 7 x weekly - 3 sets - 5 reps - Standing Hip Flexion AROM  - 1 x daily - 7 x weekly - 3 sets - 5 reps - Standing Hip Abduction  - 1 x daily - 7 x weekly - 3 sets - 5 reps - Standing Hip Extension  - 1 x daily - 7 x weekly - 3 sets - 5 reps - Standing Knee Flexion AROM with Chair Support  - 1 x daily - 7 x weekly - 3 sets - 5 reps   ASSESSMENT:  CLINICAL IMPRESSION: Pt showing progress with ability to walk for longer distances, able to go for 10 min. Continues to show deficits with stair navigation ascending and descending. Cues required to avoid compensation with step downs. GR post session to address pain and swelling  EVAL:  Ann Foster is a 58 y.o. female who was referred to physical therapy for evaluation and treatment following a R TKA by Dr Jerri 2 weeks ago.   Patient reports onset of R knee pain beginning at least a year ago. Pain is worse with R knee flexion and extension and with FWB unsupported on RLE.  Patient has deficits in R knee ROM, R LE flexibility, RLE strength, balance, safety, and HEP understanding which are interfering with ADLs and are  impacting quality of life.  On LEFS patient scored 49/80 demonstrating 38.75% functional limitation.  Ann Foster will benefit from skilled PT to address above deficits to improve mobility and activity tolerance with decreased pain interference.  OBJECTIVE IMPAIRMENTS: Abnormal gait, decreased balance, decreased knowledge of use of DME, decreased mobility, difficulty walking, decreased ROM, decreased strength, decreased safety awareness, increased edema, increased muscle spasms, impaired flexibility, and pain.   ACTIVITY LIMITATIONS: carrying, lifting, bending, standing, squatting, sleeping, stairs, and locomotion level  PARTICIPATION LIMITATIONS: meal prep, cleaning, laundry, driving, shopping, and community activity  PERSONAL FACTORS: Age, Fitness, and 1-2 comorbidities: Bronchitis, Diabetes, Htn, OA are also affecting patient's functional outcome.   REHAB POTENTIAL: Good  CLINICAL DECISION MAKING: Evolving/moderate complexity  EVALUATION COMPLEXITY: Moderate   GOALS: Goals reviewed with patient? Yes  SHORT TERM GOALS: Target date: 01/03/2024  Patient will be independent with initial HEP. Baseline: 100% PT assist required for correct completion  Goal status: MET  2.  Patient will report at least 25% improvement in R knee pain to improve QOL. Baseline: 6/10 average, 8/10 worst 12/26/23--3/10 worst over last 5 days  Goal status: MET  LONG TERM GOALS: Target date: 01/31/2024  Patient will be independent with advanced/ongoing HEP to improve outcomes and carryover.  Baseline: no advanced HEP yet Goal status: IN PROGRESS- 01/03/24 met for current HEP  2.  Patient will report at least 50-75% improvement in R knee pain to improve QOL. Baseline: 6-8/10 Goal status: MET  3.  Patient will demonstrate improved R knee AROM to 0-120 degrees to allow for normal gait and stair mechanics. Baseline: Refer to above LE ROM  table 12/26/23 : 0-100 degrees R knee Goal status: IN PROGRESS- 01/03/24 108 knee  flexion  4.  Patient will demonstrate improved R knee and RLE strength to >/= 4+/5 for improved stability and ease of mobility. Baseline: Refer to above LE MMT table Goal status: PARTIALLY MET- 01/03/24 see table  5.  Patient will be able to ambulate x 10-20 minutes with no device, and normal gait pattern without increased pain to access community for shopping Baseline: 200 feet with walker Goal status: IN PROGRESS  6. Patient will be able to ascend/descend stairs with 1 HR and reciprocal step pattern safely to access home and community.  Baseline: both hands required and 2 foot on 1 step advancedment Goal status: IN PROGRESS- 01/03/24 see treatment  7.  Patient will report >/= 60/80 on LEFS (MCID = 9 pts) to demonstrate improved functional ability. Baseline: 49  Goal status: IN PROGRESS- 50 / 80 = 62.5 % 01/09/24  8.  Patient will demonstrate at least 20/24 on DGI to decrease risk of falls. Baseline: 16/20 Goal status: MET- 01/03/24      PLAN:  PT FREQUENCY: 2x/week  PT DURATION: 8 weeks  PLANNED INTERVENTIONS: {97164- PT Re-evaluation, 97750- Physical Performance Testing, 97110-Therapeutic exercises, 97530- Therapeutic activity, 97112- Neuromuscular re-education, 97535- Self Care, [id number]- Manual therapy, [phone]- Gait training, [phone]- Electrical stimulation (unattended), 97016- Vasopneumatic device, 20560 (1-2 muscles), 20561 (3+ muscles)- Dry Needling, Patient/Family education, Balance training, Stair training, Taping, Joint mobilization, DME instructions, Cryotherapy, Moist heat, and Biofeedback  PLAN FOR NEXT SESSION: eccentric step downs, step ups on 8' step  Ann Foster, PTA 01/09/2024, 11:49 AM

## 2024-01-12 ENCOUNTER — Ambulatory Visit

## 2024-01-12 ENCOUNTER — Ambulatory Visit: Payer: Self-pay | Admitting: Nurse Practitioner

## 2024-01-12 DIAGNOSIS — M25561 Pain in right knee: Secondary | ICD-10-CM | POA: Diagnosis not present

## 2024-01-12 DIAGNOSIS — M6281 Muscle weakness (generalized): Secondary | ICD-10-CM

## 2024-01-12 DIAGNOSIS — R262 Difficulty in walking, not elsewhere classified: Secondary | ICD-10-CM

## 2024-01-12 DIAGNOSIS — R600 Localized edema: Secondary | ICD-10-CM

## 2024-01-12 DIAGNOSIS — M25661 Stiffness of right knee, not elsewhere classified: Secondary | ICD-10-CM

## 2024-01-12 NOTE — Therapy (Signed)
 OUTPATIENT PHYSICAL THERAPY LOWER EXTREMITY TREATMENT   Patient Name: Ann Foster MRN: [id number] DOB: 1965-06-25, 58 y.o., female Today's Date: 01/12/2024   END OF SESSION:  PT End of Session - 01/12/24 1056     Visit Number 13    Date for PT Re-Evaluation 01/31/24    Authorization Type [employer] Dual complete    PT Start Time 1019    PT Stop Time 1116    PT Time Calculation (min) 57 min    Activity Tolerance No increased pain;Patient tolerated treatment well    Behavior During Therapy [facility] for tasks assessed/performed           Past Medical History:  Diagnosis Date   Ankle fracture 2016   Right   Aortic atherosclerosis (HCC)    trace calcific atherosclerosis aortic arch per ct neck done 12-22-17   Bronchitis    Diabetes mellitus without complication (HCC)    Hypertension    OA (osteoarthritis) of shoulder    left shoulder, both knees arthritis   Osteoarthritis, knee    Sleep apnea    wears CPAP   Past Surgical History:  Procedure Laterality Date   CHOLECYSTECTOMY N/A 04/15/2023   Procedure: LAPAROSCOPIC CHOLECYSTECTOMY WITH ICG DYE;  Surgeon: Ebbie Cough, MD;  Location: [facility];  Service: General;  Laterality: N/A;   COLONOSCOPY     TOTAL KNEE ARTHROPLASTY Right 11/20/2023   Procedure: ARTHROPLASTY, KNEE, TOTAL RIGHT;  Surgeon: Jerri Kay HERO, MD;  Location: [facility] OR;  Service: Orthopedics;  Laterality: Right;   Patient Active Problem List   Diagnosis Date Noted   Status post total right knee replacement 11/20/2023   Primary osteoarthritis of right knee 09/26/2023   Snoring 06/07/2023   Acute cholecystitis 04/13/2023   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, with long-term current use of insulin  (HCC) 04/25/2022   Amblyopia of eye, right 07/27/2021   Corneal guttata of left eye 07/27/2021   Influenza vaccine refused 07/29/2020   COVID-19 vaccine dose declined 07/29/2020   Vasomotor symptoms due  to menopause 04/24/2020   Atrophic vaginitis 04/24/2020   Impingement syndrome of left shoulder 01/23/2018    PCP: Theotis Haze ORN, NP   REFERRING PROVIDER: Jerri Kay HERO, MD   REFERRING DIAG:  M17.11 (ICD-10-CM) - Primary osteoarthritis of right knee  THERAPY DIAG:  Acute pain of right knee  Stiffness of right knee, not elsewhere classified  Difficulty in walking, not elsewhere classified  Muscle weakness (generalized)  Localized edema  RATIONALE FOR EVALUATION AND TREATMENT: Rehabilitation  ONSET DATE: 11/20/23  NEXT MD VISIT: 01/02/24 Dr Jerri.   SUBJECTIVE:  SUBJECTIVE STATEMENT: Pt reports L knee been bothering her, R knee not painful    Patient referred to PT following  R TKA on by DR XU on 11/20/23.   She has had 2 weeks HH PT.     She is also c/o nerve pain in the R hip and anterolateral thigh that has been present since surgery and is attributed to the nerve block.  She had her bandage removed yesterday and has steri strips in place.  There is no redness/drainage/dehischence noted.  Calf edema is minimal.  Homan's is negative RLE.   Patient has end stage L knee OA as well and hopes to the L TKA done before the year is out once she is recovered from this surgery.  She has been walker dependent since surgery, but no falls.  PAIN: Are you having pain? Yes: NPRS scale: 3/10 Pain location: R knee Pain description: dull to sharp variably  Aggravating factors: end ROM flex/ext; prlonged walking Relieving factors: ice, meds, rest, elevation  PERTINENT HISTORY:  Bronchitis, Diabetes, Htn, OA  PRECAUTIONS: None  RED FLAGS: None  WEIGHT BEARING RESTRICTIONS: No  FALLS:  Has patient fallen in last 6 months? No  LIVING ENVIRONMENT: Lives with: lives with their  family Lives in: House/apartment Stairs: Yes: External: 2 steps; none Has following equipment at home: Walker - 2 wheeled, shower chair, and bed side commode  OCCUPATION:  disabled  PLOF: Independentused cane some prior to surgery due to pain, but normally independent with no device  PATIENT GOALS: walk normally without pain and get my other knee done   OBJECTIVE: (objective measures completed at initial evaluation unless otherwise dated)  PATIENT SURVEYS:  LEFS = 49/80  COGNITION: Overall cognitive status: Within functional limits for tasks assessed    SENSATION: WFL  EDEMA:  Circumferential: 42.5 cm on L knee; 44.5 cm on R knee 7/18: LLE =  43.5; RLE = 45 cm  POSTURE:  No Significant postural limitations  PALPATION: TTP over the medial and lateral R knee, along with R mid to distal IT band, and distal quads  MUSCLE LENGTH: Hamstrings: Right 80 deg; Left 90  deg Hamstrings: 90 degrees + SLR BLE ITB: mild tightness RLE Heelcord: mild tight RLE  LOWER EXTREMITY ROM:  Active ROM Right eval Left eval R 01/03/24  Hip flexion     Hip extension     Hip abduction     Hip adduction     Hip internal rotation     Hip external rotation     Knee flexion 70 120 108  Knee extension -10 0 0  Ankle dorsiflexion 0 5   Ankle plantarflexion     Ankle inversion     Ankle eversion      Passive ROM Right eval Left eval  Hip flexion    Hip extension    Hip abduction    Hip adduction    Hip internal rotation    Hip external rotation    Knee flexion 80   Knee extension -6   Ankle dorsiflexion    Ankle plantarflexion    Ankle inversion    Ankle eversion    (Blank rows = not tested)  LOWER EXTREMITY MMT:  MMT Right eval Left eval R 01/03/24  Hip flexion 4- 4+ 5  Hip extension     Hip abduction 4- 4+ 4+  Hip adduction     Hip internal rotation 3+ 4 4  Hip external rotation 4- 5 4+  Knee flexion 4+  5 4+  Knee extension 4- 5 4+  Ankle dorsiflexion 4 4   Ankle  plantarflexion     Ankle inversion     Ankle eversion      (Blank rows = not tested)   FUNCTIONAL TESTS:  5x STS = 22.41 TUG = 18.39 DGI =  16   GAIT: Distance walked: 200 feet into clinic Assistive device utilized: Walker - 2 wheeled Level of assistance: Complete Independence Gait pattern: decreased stance time- Right and antalgic Comments: ambulates independently with FWW with decreased R knee flexion in swing, decreased stance time RLE with mild limp and decreased WB RLE;   can ambulate with a SPC but more pronounced limp and requires min gait belt assist for safety;   able to ambulate down/up 1 flight steps but has to ambulate 2 feet to same step using BUE on same rail   TODAY'S TREATMENT:  01/12/24 THERAPEUTIC EXERCISE: To improve strength and ROM.  Demonstration, verbal and tactile cues throughout for technique. Bike partial rev to full rev x 8 min PROM to R knee flex and ext 2-106 deg knee ROM  THERAPEUTIC ACTIVITIES: To improve functional performance.  Demonstration, verbal and tactile cues throughout for technique. TRX squats 2x10 TRX side lunges x 20 Leg press 20lb 2x10 BLE Leg ext 15lb 2x10 BLE; R/L 5lb x 10 Leg curls 25lb 2x10 BLE; R/L 10lb x 10   Vasopneumatic to R knee at min med compression x 10' at 34 degrees. 01/09/24 THERAPEUTIC EXERCISE: To improve strength and ROM.  Demonstration, verbal and tactile cues throughout for technique. Bike partial rev to full rev x 8 min Gait for 10 min 5 laps in long hallway Prone quad stretch 2x30 w/ strap Supine quad stretch w/ strap x 1 '  THERAPEUTIC ACTIVITIES: To improve functional performance.  Demonstration, verbal and tactile cues throughout for technique. Step ups 8' RLE 2x10 Eccentric step downs 6' x 10 - cues to avoid twisting hips Lateral step downs w/ heel tap 4' x 10  Vasopneumatic to R knee at min med compression x 10' at 34 degrees. 01/05/24 THERAPEUTIC EXERCISE: To improve strength and ROM.   Demonstration, verbal and tactile cues throughout for technique. Bike  full RPM x 8'seat level 8  NEUROMUSCULAR RE-EDUCATION: To improve balance. Up/down 1 flight steps x 3 with CGA with 1 rail and CGA for reciprocal pattern, not stumbling when stepping down LLE Ladder/foam obstacle course Large foam roll step over and back x 20 BUE  THERAPEUTIC ACTIVITIES: To improve functional performance.  Demonstration, verbal and tactile cues throughout for technique. Seated cone touch for accelator to brake motions for driving x 20 RLE Step hang calf stretch x 1' x 2 BLE Seated knee extension 5# x 3/10 RLE Seated knee flexion 20# x 3/10 BLE Seated heel prop QS x 20 RLE Supine SKTC flexion stretch x 1' x 3  RLE Supine thomas hip flexor stretch x 1' x 2 RLE Supine HS to end range x 20 RLE  CP x 10' R knee   01/03/24 THERAPEUTIC EXERCISE: To improve strength and ROM.  Demonstration, verbal and tactile cues throughout for technique. Bike some partial ROM, some full RPM x 8'seat level 8 Knee flexion 20lb 2x10 BLE; 10lb x 10 R/L Knee extension 10lb 2x10 BLE; 5lb x 10 R/L  MANUAL THERAPY: To promote reduced pain and reduced edema utilizing kinesiotaping. KT tape to R knee 2 I strips for chondromalacia pattern  THERAPEUTIC ACTIVITIES: To improve functional performance.  Demonstration, verbal  and tactile cues throughout for technique. Stairs: ascending/descending - uses momentum ascending w/ R LE; turns sideways going down leading with L MMT  [facility] PT Assessment - 01/03/24 0001       Standardized Balance Assessment   Standardized Balance Assessment Dynamic Gait Index      Dynamic Gait Index   Level Surface Mild Impairment    Change in Gait Speed Normal    Gait with Horizontal Head Turns Normal    Gait with Vertical Head Turns Normal    Gait and Pivot Turn Normal    Step Over Obstacle Mild Impairment    Step Around Obstacles Normal    Steps Moderate Impairment    Total Score 20           12/28/23 THERAPEUTIC EXERCISE: To improve strength and ROM.  Demonstration, verbal and tactile cues throughout for technique. Bike some partial ROM, some full RPM x 6'seat level 8  THERAPEUTIC ACTIVITIES: To improve functional performance.  Demonstration, verbal and tactile cues throughout for technique. Knee extension 15# x 3/10 RLE Knee flexion 20# x 3/10 RLE Sidestepping GTB around ankles x 34' x 4 Standing hip ext 4# x 2/10 RLE Standing knee flexion 4# x 2/10 RLE  NEUROMUSCULAR RE-EDUCATION: To improve balance. Airex standing eyes closed x 1' Airex toe/heel raises x 30 Airex marching w/ turning 360 x 2 laps CW and CCW  MANUAL THERAPY: To promote reduced pain and reduced edema utilizing kinesiotaping. KT tape to R knee 2 I strip web pattern from medial and lateral covered by 2 more I strips for chondromaleacea pattern  Vasopneumatic to R knee at min compression x 10' at 34 degrees.  12/25/23 THERAPEUTIC ACTIVITIES: To improve functional performance.  Demonstration, verbal and tactile cues throughout for technique. Standing gastroc stretch on step 1' RLE Step lunge into step 10x5 RLE Step ups 6' RLE x 20  Lateral step ups 6' RLE x 20 Heel raise drop from 6' step  x20 Calf stretch step hang x 1' BLE Seated LAQ 4# w/ 2-3 sec holds x 3/10 RLE Prone knee flexion 4# x 2/10 RLE SKTC knee flexion stretch x 1' x 2 w/ 4# ankle wt   NEUROMUSCULAR RE-EDUCATION: To improve balance. Sidestepping GTB at knees 1/2 lap in gym x 4 Monster walk GTB at knees 1/2 lap x 2 SLS RLE x 1' PNF knee flexion stretch in prone with contract-relax-contract  Vasopneumatic to R knee at min compression x 10' at 34 degrees.  12/21/23 THERAPEUTIC EXERCISE: To improve strength and ROM.  Demonstration, verbal and tactile cues throughout for technique. Bike some partial ROM, some full RPM x 8' Standing gastroc stretch on step 1' RLE Step lunge into step 10x5 RLE THERAPEUTIC ACTIVITIES: To improve  functional performance.  Demonstration, verbal and tactile cues throughout for technique. Step ups 6' RLE x 20  Lateral step ups 6' RLE x 20 Heel raise drop from 6' step  x20 Squats with UE support 2x10 Vasopneumatic to R knee at min compression x 10' at 34 degrees. GAIT TRAINING: With [facility] cues to increase step length and to avoid circumduction during swing phase  PATIENT EDUCATION:  Education details: stair training sequence doing reciprocal pattern and breaking old habit of 1 step at a time; not using SPC at home now; working on walking inclined surfaces at home.    Person educated: Patient Education method: Explanation, Demonstration, Verbal cues, Tactile cues, and MedBridge[employer] app access provided Education comprehension: verbalized understanding, verbal cues required, tactile cues required,  and needs further education  HOME EXERCISE PROGRAM: Access Code: [email or web address] URL: [email or web address] Date: 12/06/2023 Prepared by: Garnette Montclair  Exercises - Supine Ankle Pumps  - 3 x daily - 7 x weekly - 1 sets - 10 reps - Supine Quad Set  - 3 x daily - 7 x weekly - 1 sets - 10 reps - Straight Leg Raise  - 3 x daily - 7 x weekly - 1 sets - 10 reps - Supine Heel Slides  - 3 x daily - 7 x weekly - 1 sets - 10 reps - Supine Hip Abduction  - 3 x daily - 7 x weekly - 1 sets - 10 reps - Seated Long Arc Quad  - 3 x daily - 7 x weekly - 1 sets - 10 reps - Seated Knee Flexion  - 3 x daily - 7 x weekly - 1 sets - 10 reps - Supine Knee Extension Strengthening  - 1 x daily - 7 x weekly - 3 sets - 10 reps - Standing Heel Raise with Support  - 1 x daily - 7 x weekly - 3 sets - 5 reps - Toe Raises with Counter Support  - 1 x daily - 7 x weekly - 3 sets - 5 reps - Mini Squats at Table  - 1 x daily - 7 x weekly - 3 sets - 5 reps - Standing Hip Flexion AROM  - 1 x daily - 7 x weekly - 3 sets - 5 reps - Standing Hip Abduction  - 1 x daily - 7 x weekly - 3 sets - 5 reps - Standing Hip  Extension  - 1 x daily - 7 x weekly - 3 sets - 5 reps - Standing Knee Flexion AROM with Chair Support  - 1 x daily - 7 x weekly - 3 sets - 5 reps   ASSESSMENT:  CLINICAL IMPRESSION: Pt tolerated the session well. Worked on squatting motions and machine bulk strengthening. Cues provided to isolate the correct muscles and avoid compensations. Knee ROM was a little stiffer today. GR today to address edema.  EVAL:  Ann Foster is a 58 y.o. female who was referred to physical therapy for evaluation and treatment following a R TKA by Dr Jerri 2 weeks ago.   Patient reports onset of R knee pain beginning at least a year ago. Pain is worse with R knee flexion and extension and with FWB unsupported on RLE.  Patient has deficits in R knee ROM, R LE flexibility, RLE strength, balance, safety, and HEP understanding which are interfering with ADLs and are impacting quality of life.  On LEFS patient scored 49/80 demonstrating 38.75% functional limitation.  [place] will benefit from skilled PT to address above deficits to improve mobility and activity tolerance with decreased pain interference.  OBJECTIVE IMPAIRMENTS: Abnormal gait, decreased balance, decreased knowledge of use of DME, decreased mobility, difficulty walking, decreased ROM, decreased strength, decreased safety awareness, increased edema, increased muscle spasms, impaired flexibility, and pain.   ACTIVITY LIMITATIONS: carrying, lifting, bending, standing, squatting, sleeping, stairs, and locomotion level  PARTICIPATION LIMITATIONS: meal prep, cleaning, laundry, driving, shopping, and community activity  PERSONAL FACTORS: Age, Fitness, and 1-2 comorbidities: Bronchitis, Diabetes, Htn, OA are also affecting patient's functional outcome.   REHAB POTENTIAL: Good  CLINICAL DECISION MAKING: Evolving/moderate complexity  EVALUATION COMPLEXITY: Moderate   GOALS: Goals reviewed with patient? Yes  SHORT TERM GOALS: Target date:  01/03/2024  Patient will be independent with initial HEP. Baseline: 100% PT  assist required for correct completion  Goal status: MET  2.  Patient will report at least 25% improvement in R knee pain to improve QOL. Baseline: 6/10 average, 8/10 worst 12/26/23--3/10 worst over last 5 days  Goal status: MET  LONG TERM GOALS: Target date: 01/31/2024  Patient will be independent with advanced/ongoing HEP to improve outcomes and carryover.  Baseline: no advanced HEP yet Goal status: IN PROGRESS- 01/03/24 met for current HEP  2.  Patient will report at least 50-75% improvement in R knee pain to improve QOL. Baseline: 6-8/10 Goal status: MET  3.  Patient will demonstrate improved R knee AROM to 0-120 degrees to allow for normal gait and stair mechanics. Baseline: Refer to above LE ROM table 12/26/23 : 0-100 degrees R knee Goal status: IN PROGRESS- 01/03/24 108 knee flexion  4.  Patient will demonstrate improved R knee and RLE strength to >/= 4+/5 for improved stability and ease of mobility. Baseline: Refer to above LE MMT table Goal status: PARTIALLY MET- 01/03/24 see table  5.  Patient will be able to ambulate x 10-20 minutes with no device, and normal gait pattern without increased pain to access community for shopping Baseline: 200 feet with walker Goal status: IN PROGRESS  6. Patient will be able to ascend/descend stairs with 1 HR and reciprocal step pattern safely to access home and community.  Baseline: both hands required and 2 foot on 1 step advancedment Goal status: IN PROGRESS- 01/03/24 see treatment  7.  Patient will report >/= 60/80 on LEFS (MCID = 9 pts) to demonstrate improved functional ability. Baseline: 49  Goal status: IN PROGRESS- 50 / 80 = 62.5 % 01/09/24  8.  Patient will demonstrate at least 20/24 on DGI to decrease risk of falls. Baseline: 16/20 Goal status: MET- 01/03/24      PLAN:  PT FREQUENCY: 2x/week  PT DURATION: 8 weeks  PLANNED INTERVENTIONS: {97164- PT  Re-evaluation, 97750- Physical Performance Testing, 97110-Therapeutic exercises, 97530- Therapeutic activity, 97112- Neuromuscular re-education, 97535- Self Care, [id number]- Manual therapy, [phone]- Gait training, [phone]- Electrical stimulation (unattended), 97016- Vasopneumatic device, 20560 (1-2 muscles), 20561 (3+ muscles)- Dry Needling, Patient/Family education, Balance training, Stair training, Taping, Joint mobilization, DME instructions, Cryotherapy, Moist heat, and Biofeedback  PLAN FOR NEXT SESSION: eccentric step downs, step ups on 8' step  Bryen Hinderman L Seymone Forlenza, PTA 01/12/2024, 12:10 PM

## 2024-01-16 ENCOUNTER — Ambulatory Visit: Admitting: Rehabilitation

## 2024-01-16 ENCOUNTER — Encounter: Payer: Self-pay | Admitting: Rehabilitation

## 2024-01-16 DIAGNOSIS — M25661 Stiffness of right knee, not elsewhere classified: Secondary | ICD-10-CM

## 2024-01-16 DIAGNOSIS — M6281 Muscle weakness (generalized): Secondary | ICD-10-CM

## 2024-01-16 DIAGNOSIS — M25561 Pain in right knee: Secondary | ICD-10-CM

## 2024-01-16 DIAGNOSIS — R262 Difficulty in walking, not elsewhere classified: Secondary | ICD-10-CM

## 2024-01-16 NOTE — Therapy (Signed)
 OUTPATIENT PHYSICAL THERAPY LOWER EXTREMITY TREATMENT   Patient Name: Ann Foster MRN: [id number] DOB: 1965-12-29, 58 y.o., female Today's Date: 01/16/2024   END OF SESSION:  PT End of Session - 01/16/24 1012     Visit Number 14    Date for PT Re-Evaluation 01/31/24    Authorization Type [employer] Dual complete    PT Start Time 1009    PT Stop Time 1110    PT Time Calculation (min) 61 min    Activity Tolerance No increased pain;Patient tolerated treatment well    Behavior During Therapy [facility] for tasks assessed/performed            Past Medical History:  Diagnosis Date   Ankle fracture 2016   Right   Aortic atherosclerosis (HCC)    trace calcific atherosclerosis aortic arch per ct neck done 12-22-17   Bronchitis    Diabetes mellitus without complication (HCC)    Hypertension    OA (osteoarthritis) of shoulder    left shoulder, both knees arthritis   Osteoarthritis, knee    Sleep apnea    wears CPAP   Past Surgical History:  Procedure Laterality Date   CHOLECYSTECTOMY N/A 04/15/2023   Procedure: LAPAROSCOPIC CHOLECYSTECTOMY WITH ICG DYE;  Surgeon: Ebbie Cough, MD;  Location: [facility];  Service: General;  Laterality: N/A;   COLONOSCOPY     TOTAL KNEE ARTHROPLASTY Right 11/20/2023   Procedure: ARTHROPLASTY, KNEE, TOTAL RIGHT;  Surgeon: Jerri Kay HERO, MD;  Location: [facility] OR;  Service: Orthopedics;  Laterality: Right;   Patient Active Problem List   Diagnosis Date Noted   Status post total right knee replacement 11/20/2023   Primary osteoarthritis of right knee 09/26/2023   Snoring 06/07/2023   Acute cholecystitis 04/13/2023   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, with long-term current use of insulin  (HCC) 04/25/2022   Amblyopia of eye, right 07/27/2021   Corneal guttata of left eye 07/27/2021   Influenza vaccine refused 07/29/2020   COVID-19 vaccine dose declined 07/29/2020   Vasomotor symptoms  due to menopause 04/24/2020   Atrophic vaginitis 04/24/2020   Impingement syndrome of left shoulder 01/23/2018    PCP: Theotis Haze ORN, NP   REFERRING PROVIDER: Jerri Kay HERO, MD   REFERRING DIAG:  M17.11 (ICD-10-CM) - Primary osteoarthritis of right knee  THERAPY DIAG:  Acute pain of right knee  Stiffness of right knee, not elsewhere classified  Difficulty in walking, not elsewhere classified  Muscle weakness (generalized)  RATIONALE FOR EVALUATION AND TREATMENT: Rehabilitation  ONSET DATE: 11/20/23  NEXT MD VISIT: 01/02/24 Dr Jerri.   SUBJECTIVE:  SUBJECTIVE STATEMENT: Patient states she is feeling a lot better.   Just can't get her knee to bend back further.   Patient is able to do continuous full RPM on the bike today without needing partial revolutions.     Patient referred to PT following  R TKA on by DR XU on 11/20/23.   She has had 2 weeks HH PT.     She is also c/o nerve pain in the R hip and anterolateral thigh that has been present since surgery and is attributed to the nerve block.  She had her bandage removed yesterday and has steri strips in place.  There is no redness/drainage/dehischence noted.  Calf edema is minimal.  Homan's is negative RLE.   Patient has end stage L knee OA as well and hopes to the L TKA done before the year is out once she is recovered from this surgery.  She has been walker dependent since surgery, but no falls.  PAIN: Are you having pain? Yes: NPRS scale: 3/10 Pain location: R knee Pain description: dull to sharp variably  Aggravating factors: end ROM flex/ext; prlonged walking Relieving factors: ice, meds, rest, elevation  PERTINENT HISTORY:  Bronchitis, Diabetes, Htn, OA  PRECAUTIONS: None  RED FLAGS: None  WEIGHT BEARING RESTRICTIONS:  No  FALLS:  Has patient fallen in last 6 months? No  LIVING ENVIRONMENT: Lives with: lives with their family Lives in: House/apartment Stairs: Yes: External: 2 steps; none Has following equipment at home: Walker - 2 wheeled, shower chair, and bed side commode  OCCUPATION:  disabled  PLOF: Independentused cane some prior to surgery due to pain, but normally independent with no device  PATIENT GOALS: walk normally without pain and get my other knee done   OBJECTIVE: (objective measures completed at initial evaluation unless otherwise dated)  PATIENT SURVEYS:  LEFS = 49/80  COGNITION: Overall cognitive status: Within functional limits for tasks assessed    SENSATION: WFL  EDEMA:  Circumferential: 42.5 cm on L knee; 44.5 cm on R knee 7/18: LLE =  43.5; RLE = 45 cm  POSTURE:  No Significant postural limitations  PALPATION: TTP over the medial and lateral R knee, along with R mid to distal IT band, and distal quads  MUSCLE LENGTH: Hamstrings: Right 80 deg; Left 90  deg Hamstrings: 90 degrees + SLR BLE ITB: mild tightness RLE Heelcord: mild tight RLE  LOWER EXTREMITY ROM:  Active ROM Right eval Left eval R 01/03/24  Hip flexion     Hip extension     Hip abduction     Hip adduction     Hip internal rotation     Hip external rotation     Knee flexion 70 120 108  Knee extension -10 0 0  Ankle dorsiflexion 0 5   Ankle plantarflexion     Ankle inversion     Ankle eversion      Passive ROM Right eval Left eval  Hip flexion    Hip extension    Hip abduction    Hip adduction    Hip internal rotation    Hip external rotation    Knee flexion 80   Knee extension -6   Ankle dorsiflexion    Ankle plantarflexion    Ankle inversion    Ankle eversion    (Blank rows = not tested)  LOWER EXTREMITY MMT:  MMT Right eval Left eval R 01/03/24  Hip flexion 4- 4+ 5  Hip extension     Hip abduction  4- 4+ 4+  Hip adduction     Hip internal rotation 3+ 4 4  Hip external  rotation 4- 5 4+  Knee flexion 4+ 5 4+  Knee extension 4- 5 4+  Ankle dorsiflexion 4 4   Ankle plantarflexion     Ankle inversion     Ankle eversion      (Blank rows = not tested)   FUNCTIONAL TESTS:  5x STS = 22.41 TUG = 18.39 DGI =  16   GAIT: Distance walked: 200 feet into clinic Assistive device utilized: Walker - 2 wheeled Level of assistance: Complete Independence Gait pattern: decreased stance time- Right and antalgic Comments: ambulates independently with FWW with decreased R knee flexion in swing, decreased stance time RLE with mild limp and decreased WB RLE;   can ambulate with a SPC but more pronounced limp and requires min gait belt assist for safety;   able to ambulate down/up 1 flight steps but has to ambulate 2 feet to same step using BUE on same rail   TODAY'S TREATMENT:  01/16/24 THERAPEUTIC EXERCISE: To improve ROM.  Demonstration, verbal and tactile cues throughout for technique. Bike full RPM x 8'  NEUROMUSCULAR RE-EDUCATION: To improve balance Aerobic step up to blue foam F x 2/10; lateral x 2/10 RLE SLS on foam EO x 1'; EC x 1' Resisted gait blue TB at counter F/B x 3 lap; to L x 3 lap; to R x 3 lap; facing PT backing up x 3 laps  THERAPEUTIC ACTIVITIES: To improve functional performance.  Demonstration, verbal and tactile cues throughout for technique. Step hang calf stretch x 1' x 2 BLE Standing foot prop bent knee soleus stretch x 1' x 2 Seated knee ext 5# x 3/10 RLE Seated knee flexion 10# x 2/10 RLE Supine thomas hip/knee stretch x 1' x 2 Prone R knee flexion with contract relax contract manual technique w/ PT  Supine SKTC knee flexion stretch w/ manual overpressure RLE Seated EOB R knee flexion w/ overpressure from contralateral LE x 1' x 2   MODALITIES: Vasopneumatic to R knee at min compression x 15' at 34 degrees  01/12/24 THERAPEUTIC EXERCISE: To improve strength and ROM.  Demonstration, verbal and tactile cues throughout for  technique. Bike partial rev to full rev x 8 min PROM to R knee flex and ext 2-106 deg knee ROM  THERAPEUTIC ACTIVITIES: To improve functional performance.  Demonstration, verbal and tactile cues throughout for technique. TRX squats 2x10 TRX side lunges x 20 Leg press 20lb 2x10 BLE Leg ext 15lb 2x10 BLE; R/L 5lb x 10 Leg curls 25lb 2x10 BLE; R/L 10lb x 10   Vasopneumatic to R knee at min med compression x 10' at 34 degrees. 01/09/24 THERAPEUTIC EXERCISE: To improve strength and ROM.  Demonstration, verbal and tactile cues throughout for technique. Bike partial rev to full rev x 8 min Gait for 10 min 5 laps in long hallway Prone quad stretch 2x30 w/ strap Supine quad stretch w/ strap x 1 '  THERAPEUTIC ACTIVITIES: To improve functional performance.  Demonstration, verbal and tactile cues throughout for technique. Step ups 8' RLE 2x10 Eccentric step downs 6' x 10 - cues to avoid twisting hips Lateral step downs w/ heel tap 4' x 10  Vasopneumatic to R knee at min med compression x 10' at 34 degrees. PATIENT EDUCATION:  Education details: SLS on pillow/couch cushion at home for balance   Person educated: Patient Education method: Explanation, Demonstration, Verbal cues, Tactile cues, and MedBridge[employer]  app access provided Education comprehension: verbalized understanding, verbal cues required, tactile cues required, and needs further education  HOME EXERCISE PROGRAM: Access Code: [email or web address] URL: [email or web address] Date: 12/06/2023 Prepared by: Garnette Montclair  Exercises - Supine Ankle Pumps  - 3 x daily - 7 x weekly - 1 sets - 10 reps - Supine Quad Set  - 3 x daily - 7 x weekly - 1 sets - 10 reps - Straight Leg Raise  - 3 x daily - 7 x weekly - 1 sets - 10 reps - Supine Heel Slides  - 3 x daily - 7 x weekly - 1 sets - 10 reps - Supine Hip Abduction  - 3 x daily - 7 x weekly - 1 sets - 10 reps - Seated Long Arc Quad  - 3 x daily - 7 x weekly - 1 sets - 10 reps -  Seated Knee Flexion  - 3 x daily - 7 x weekly - 1 sets - 10 reps - Supine Knee Extension Strengthening  - 1 x daily - 7 x weekly - 3 sets - 10 reps - Standing Heel Raise with Support  - 1 x daily - 7 x weekly - 3 sets - 5 reps - Toe Raises with Counter Support  - 1 x daily - 7 x weekly - 3 sets - 5 reps - Mini Squats at Table  - 1 x daily - 7 x weekly - 3 sets - 5 reps - Standing Hip Flexion AROM  - 1 x daily - 7 x weekly - 3 sets - 5 reps - Standing Hip Abduction  - 1 x daily - 7 x weekly - 3 sets - 5 reps - Standing Hip Extension  - 1 x daily - 7 x weekly - 3 sets - 5 reps - Standing Knee Flexion AROM with Chair Support  - 1 x daily - 7 x weekly - 3 sets - 5 reps   ASSESSMENT:  CLINICAL IMPRESSION: 8/19:  Patient continues to make steady progress.   R knee ROM today is -2 to 110 degrees with SKTC flexion stretch.  Flexion remains her biggest limitation.   She has difficulty with single limb balance activities today and there is more room for improvement with her balance.   Pain is much improved.  Her main c/o pain is with knee flexion stretching.  Still has some weakness in the R knee with steps ups.  She still has some edema in the L knee so vaspneumatic is still appropriate for her.   Anticipate d/c in 2 weeks and she is agreeable with this plan.  EVAL:  Marisue Canion is a 58 y.o. female who was referred to physical therapy for evaluation and treatment following a R TKA by Dr Jerri 2 weeks ago.   Patient reports onset of R knee pain beginning at least a year ago. Pain is worse with R knee flexion and extension and with FWB unsupported on RLE.  Patient has deficits in R knee ROM, R LE flexibility, RLE strength, balance, safety, and HEP understanding which are interfering with ADLs and are impacting quality of life.  On LEFS patient scored 49/80 demonstrating 38.75% functional limitation.  [place] will benefit from skilled PT to address above deficits to improve mobility and activity tolerance with  decreased pain interference.  OBJECTIVE IMPAIRMENTS: Abnormal gait, decreased balance, decreased knowledge of use of DME, decreased mobility, difficulty walking, decreased ROM, decreased strength, decreased safety awareness, increased edema, increased muscle spasms, impaired flexibility,  and pain.   ACTIVITY LIMITATIONS: carrying, lifting, bending, standing, squatting, sleeping, stairs, and locomotion level  PARTICIPATION LIMITATIONS: meal prep, cleaning, laundry, driving, shopping, and community activity  PERSONAL FACTORS: Age, Fitness, and 1-2 comorbidities: Bronchitis, Diabetes, Htn, OA are also affecting patient's functional outcome.   REHAB POTENTIAL: Good  CLINICAL DECISION MAKING: Evolving/moderate complexity  EVALUATION COMPLEXITY: Moderate   GOALS: Goals reviewed with patient? Yes  SHORT TERM GOALS: Target date: 01/03/2024  Patient will be independent with initial HEP. Baseline: 100% PT assist required for correct completion  Goal status: MET  2.  Patient will report at least 25% improvement in R knee pain to improve QOL. Baseline: 6/10 average, 8/10 worst 12/26/23--3/10 worst over last 5 days  Goal status: MET  LONG TERM GOALS: Target date: 01/31/2024  Patient will be independent with advanced/ongoing HEP to improve outcomes and carryover.  Baseline: no advanced HEP yet Goal status: IN PROGRESS- 01/03/24 met for current HEP  2.  Patient will report at least 50-75% improvement in R knee pain to improve QOL. Baseline: 6-8/10 Goal status: MET  3.  Patient will demonstrate improved R knee AROM to 0-120 degrees to allow for normal gait and stair mechanics. Baseline: Refer to above LE ROM table 12/26/23 : 0-100 degrees R knee 01/15/24:  -2 to 110 degrees R knee Goal status: IN PROGRESS- 01/03/24 108 knee flexion  4.  Patient will demonstrate improved R knee and RLE strength to >/= 4+/5 for improved stability and ease of mobility. Baseline: Refer to above LE MMT table Goal  status: PARTIALLY MET- 01/03/24 see table  5.  Patient will be able to ambulate x 10-20 minutes with no device, and normal gait pattern without increased pain to access community for shopping Baseline: 200 feet with walker Goal status: IN PROGRESS  6. Patient will be able to ascend/descend stairs with 1 HR and reciprocal step pattern safely to access home and community.  Baseline: both hands required and 2 foot on 1 step advancedment Goal status: IN PROGRESS- 01/03/24 see treatment  7.  Patient will report >/= 60/80 on LEFS (MCID = 9 pts) to demonstrate improved functional ability. Baseline: 49  Goal status: IN PROGRESS- 50 / 80 = 62.5 % 01/09/24  8.  Patient will demonstrate at least 20/24 on DGI to decrease risk of falls. Baseline: 16/20 Goal status: MET- 01/03/24      PLAN:  PT FREQUENCY: 2x/week  PT DURATION: 8 weeks  PLANNED INTERVENTIONS: {97164- PT Re-evaluation, 97750- Physical Performance Testing, 97110-Therapeutic exercises, 97530- Therapeutic activity, 97112- Neuromuscular re-education, 97535- Self Care, [id number]- Manual therapy, [phone]- Gait training, [phone]- Electrical stimulation (unattended), 97016- Vasopneumatic device, 20560 (1-2 muscles), 20561 (3+ muscles)- Dry Needling, Patient/Family education, Balance training, Stair training, Taping, Joint mobilization, DME instructions, Cryotherapy, Moist heat, and Biofeedback  PLAN FOR NEXT SESSION: eccentric step downs, step ups on 8' step  Jenasis Straley, PT 01/16/2024, 2:09 PM

## 2024-01-18 ENCOUNTER — Ambulatory Visit: Admitting: Rehabilitation

## 2024-01-18 DIAGNOSIS — M25561 Pain in right knee: Secondary | ICD-10-CM

## 2024-01-18 DIAGNOSIS — R262 Difficulty in walking, not elsewhere classified: Secondary | ICD-10-CM

## 2024-01-18 DIAGNOSIS — R600 Localized edema: Secondary | ICD-10-CM

## 2024-01-18 DIAGNOSIS — M6281 Muscle weakness (generalized): Secondary | ICD-10-CM

## 2024-01-18 DIAGNOSIS — M25661 Stiffness of right knee, not elsewhere classified: Secondary | ICD-10-CM

## 2024-01-18 NOTE — Therapy (Signed)
 OUTPATIENT PHYSICAL THERAPY LOWER EXTREMITY TREATMENT   Patient Name: Breindy Meadow MRN: [id number] DOB: [birth year], 58 y.o., female Today's Date: 01/18/2024   END OF SESSION:  PT End of Session - 01/18/24 1019     Visit Number 15    Date for PT Re-Evaluation 01/31/24    Authorization Type [employer] Dual complete    PT Start Time 1015    PT Stop Time 1115    PT Time Calculation (min) 60 min    Activity Tolerance No increased pain;Patient tolerated treatment well    Behavior During Therapy [facility] for tasks assessed/performed            Past Medical History:  Diagnosis Date   Ankle fracture 2016   Right   Aortic atherosclerosis (HCC)    trace calcific atherosclerosis aortic arch per ct neck done 12-22-17   Bronchitis    Diabetes mellitus without complication (HCC)    Hypertension    OA (osteoarthritis) of shoulder    left shoulder, both knees arthritis   Osteoarthritis, knee    Sleep apnea    wears CPAP   Past Surgical History:  Procedure Laterality Date   CHOLECYSTECTOMY N/A 04/15/2023   Procedure: LAPAROSCOPIC CHOLECYSTECTOMY WITH ICG DYE;  Surgeon: Ebbie Cough, MD;  Location: [facility];  Service: General;  Laterality: N/A;   COLONOSCOPY     TOTAL KNEE ARTHROPLASTY Right 11/20/2023   Procedure: ARTHROPLASTY, KNEE, TOTAL RIGHT;  Surgeon: Jerri Kay HERO, MD;  Location: [facility] OR;  Service: Orthopedics;  Laterality: Right;   Patient Active Problem List   Diagnosis Date Noted   Status post total right knee replacement 11/20/2023   Primary osteoarthritis of right knee 09/26/2023   Snoring 06/07/2023   Acute cholecystitis 04/13/2023   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, with long-term current use of insulin  (HCC) 04/25/2022   Amblyopia of eye, right 07/27/2021   Corneal guttata of left eye 07/27/2021   Influenza vaccine refused 07/29/2020   COVID-19 vaccine dose declined 07/29/2020   Vasomotor symptoms  due to menopause 04/24/2020   Atrophic vaginitis 04/24/2020   Impingement syndrome of left shoulder 01/23/2018    PCP: Theotis Haze ORN, NP   REFERRING PROVIDER: Jerri Kay HERO, MD   REFERRING DIAG:  M17.11 (ICD-10-CM) - Primary osteoarthritis of right knee  THERAPY DIAG:  Acute pain of right knee  Stiffness of right knee, not elsewhere classified  Difficulty in walking, not elsewhere classified  Muscle weakness (generalized)  Localized edema  RATIONALE FOR EVALUATION AND TREATMENT: Rehabilitation  ONSET DATE: 11/20/23  NEXT MD VISIT: 01/02/24 Dr Jerri.   SUBJECTIVE:  SUBJECTIVE STATEMENT: Patient reports she is not having any pain today; 0/10.   This is a big improvement for her.  She is very pleased with her progress.  States has difficulty lowering down slowly on steps from RLE.    Patient referred to PT following  R TKA on by DR XU on 11/20/23.   She has had 2 weeks HH PT.     She is also c/o nerve pain in the R hip and anterolateral thigh that has been present since surgery and is attributed to the nerve block.  She had her bandage removed yesterday and has steri strips in place.  There is no redness/drainage/dehischence noted.  Calf edema is minimal.  Homan's is negative RLE.   Patient has end stage L knee OA as well and hopes to the L TKA done before the year is out once she is recovered from this surgery.  She has been walker dependent since surgery, but no falls.  PAIN: Are you having pain? Yes: NPRS scale: 3/10 Pain location: R knee Pain description: dull to sharp variably  Aggravating factors: end ROM flex/ext; prlonged walking Relieving factors: ice, meds, rest, elevation  PERTINENT HISTORY:  Bronchitis, Diabetes, Htn, OA  PRECAUTIONS: None  RED FLAGS: None  WEIGHT  BEARING RESTRICTIONS: No  FALLS:  Has patient fallen in last 6 months? No  LIVING ENVIRONMENT: Lives with: lives with their family Lives in: House/apartment Stairs: Yes: External: 2 steps; none Has following equipment at home: Walker - 2 wheeled, shower chair, and bed side commode  OCCUPATION:  disabled  PLOF: Independentused cane some prior to surgery due to pain, but normally independent with no device  PATIENT GOALS: walk normally without pain and get my other knee done   OBJECTIVE: (objective measures completed at initial evaluation unless otherwise dated)  PATIENT SURVEYS:  LEFS = 49/80  COGNITION: Overall cognitive status: Within functional limits for tasks assessed    SENSATION: WFL  EDEMA:  Circumferential: 42.5 cm on L knee; 44.5 cm on R knee 7/18: LLE =  43.5; RLE = 45 cm  POSTURE:  No Significant postural limitations  PALPATION: TTP over the medial and lateral R knee, along with R mid to distal IT band, and distal quads  MUSCLE LENGTH: Hamstrings: Right 80 deg; Left 90  deg Hamstrings: 90 degrees + SLR BLE ITB: mild tightness RLE Heelcord: mild tight RLE  LOWER EXTREMITY ROM:  Active ROM Right eval Left eval R 01/03/24  Hip flexion     Hip extension     Hip abduction     Hip adduction     Hip internal rotation     Hip external rotation     Knee flexion 70 120 108  Knee extension -10 0 0  Ankle dorsiflexion 0 5   Ankle plantarflexion     Ankle inversion     Ankle eversion      Passive ROM Right eval Left eval  Hip flexion    Hip extension    Hip abduction    Hip adduction    Hip internal rotation    Hip external rotation    Knee flexion 80   Knee extension -6   Ankle dorsiflexion    Ankle plantarflexion    Ankle inversion    Ankle eversion    (Blank rows = not tested)  LOWER EXTREMITY MMT:  MMT Right eval Left eval R 01/03/24  Hip flexion 4- 4+ 5  Hip extension     Hip abduction  4- 4+ 4+  Hip adduction     Hip internal rotation  3+ 4 4  Hip external rotation 4- 5 4+  Knee flexion 4+ 5 4+  Knee extension 4- 5 4+  Ankle dorsiflexion 4 4   Ankle plantarflexion     Ankle inversion     Ankle eversion      (Blank rows = not tested)   FUNCTIONAL TESTS:  5x STS = 22.41 TUG = 18.39 DGI =  16   GAIT: Distance walked: 200 feet into clinic Assistive device utilized: Walker - 2 wheeled Level of assistance: Complete Independence Gait pattern: decreased stance time- Right and antalgic Comments: ambulates independently with FWW with decreased R knee flexion in swing, decreased stance time RLE with mild limp and decreased WB RLE;   can ambulate with a SPC but more pronounced limp and requires min gait belt assist for safety;   able to ambulate down/up 1 flight steps but has to ambulate 2 feet to same step using BUE on same rail   TODAY'S TREATMENT:  01/18/24 THERAPEUTIC EXERCISE: To improve ROM.  Demonstration, verbal and tactile cues throughout for technique. NuStep L4 x 8'  NEUROMUSCULAR RE-EDUCATION: To improve balance. 8 step ups x 2/10 RLE Eccentric step downs from 6 x 2/10 RLE Resisted gait blue TB F/B, backward/forward, S/S each direction at counter x 3 laps each Prone PNF knee flexion stretch with contract relax contract and contract relax techniques  THERAPEUTIC ACTIVITIES: To improve functional performance.  Demonstration, verbal and tactile cues throughout for technique. Leg press 15# 2x10 BLE Leg ext 10# x 3/10 RLE Leg curls 10# 3x10 RLE SKTC flexion stretch x 1' x 2 RLE Prone knee flexion AROM X 20 RLE  01/16/24 THERAPEUTIC EXERCISE: To improve ROM.  Demonstration, verbal and tactile cues throughout for technique. Bike full RPM x 8'  NEUROMUSCULAR RE-EDUCATION: To improve balance Aerobic step up to blue foam F x 2/10; lateral x 2/10 RLE SLS on foam EO x 1'; EC x 1' Resisted gait blue TB at counter F/B x 3 lap; to L x 3 lap; to R x 3 lap; facing PT backing up x 3 laps  THERAPEUTIC ACTIVITIES: To  improve functional performance.  Demonstration, verbal and tactile cues throughout for technique. Step hang calf stretch x 1' x 2 BLE Standing foot prop bent knee soleus stretch x 1' x 2 Seated knee ext 5# x 3/10 RLE Seated knee flexion 10# x 2/10 RLE Supine thomas hip/knee stretch x 1' x 2 Prone R knee flexion with contract relax contract manual technique w/ PT  Supine SKTC knee flexion stretch w/ manual overpressure RLE Seated EOB R knee flexion w/ overpressure from contralateral LE x 1' x 2   MODALITIES: Vasopneumatic to R knee at min compression x 15' at 34 degrees  01/12/24 THERAPEUTIC EXERCISE: To improve strength and ROM.  Demonstration, verbal and tactile cues throughout for technique. Bike partial rev to full rev x 8 min PROM to R knee flex and ext 2-106 deg knee ROM  THERAPEUTIC ACTIVITIES: To improve functional performance.  Demonstration, verbal and tactile cues throughout for technique. TRX squats 2x10 TRX side lunges x 20 Leg press 20lb 2x10 BLE Leg ext 15lb 2x10 BLE; R/L 5lb x 10 Leg curls 25lb 2x10 BLE; R/L 10lb x 10   Vasopneumatic to R knee at min med compression x 10' at 34 degrees. 01/09/24 THERAPEUTIC EXERCISE: To improve strength and ROM.  Demonstration, verbal and tactile cues throughout for technique. Bike  partial rev to full rev x 8 min Gait for 10 min 5 laps in long hallway Prone quad stretch 2x30 w/ strap Supine quad stretch w/ strap x 1 '  THERAPEUTIC ACTIVITIES: To improve functional performance.  Demonstration, verbal and tactile cues throughout for technique. Step ups 8' RLE 2x10 Eccentric step downs 6' x 10 - cues to avoid twisting hips Lateral step downs w/ heel tap 4' x 10  Vasopneumatic to R knee at min med compression x 10' at 34 degrees. PATIENT EDUCATION:  Education details: adding higher step ups to HEP; closer to 8  Person educated: Patient Education method: Explanation, Demonstration, Verbal cues, Tactile cues, and MedBridge[employer] app  access provided Education comprehension: verbalized understanding, verbal cues required, tactile cues required, and needs further education  HOME EXERCISE PROGRAM: Access Code: [email or web address] URL: [email or web address] Date: 12/06/2023 Prepared by: Garnette Montclair  Exercises - Supine Ankle Pumps  - 3 x daily - 7 x weekly - 1 sets - 10 reps - Supine Quad Set  - 3 x daily - 7 x weekly - 1 sets - 10 reps - Straight Leg Raise  - 3 x daily - 7 x weekly - 1 sets - 10 reps - Supine Heel Slides  - 3 x daily - 7 x weekly - 1 sets - 10 reps - Supine Hip Abduction  - 3 x daily - 7 x weekly - 1 sets - 10 reps - Seated Long Arc Quad  - 3 x daily - 7 x weekly - 1 sets - 10 reps - Seated Knee Flexion  - 3 x daily - 7 x weekly - 1 sets - 10 reps - Supine Knee Extension Strengthening  - 1 x daily - 7 x weekly - 3 sets - 10 reps - Standing Heel Raise with Support  - 1 x daily - 7 x weekly - 3 sets - 5 reps - Toe Raises with Counter Support  - 1 x daily - 7 x weekly - 3 sets - 5 reps - Mini Squats at Table  - 1 x daily - 7 x weekly - 3 sets - 5 reps - Standing Hip Flexion AROM  - 1 x daily - 7 x weekly - 3 sets - 5 reps - Standing Hip Abduction  - 1 x daily - 7 x weekly - 3 sets - 5 reps - Standing Hip Extension  - 1 x daily - 7 x weekly - 3 sets - 5 reps - Standing Knee Flexion AROM with Chair Support  - 1 x daily - 7 x weekly - 3 sets - 5 reps   ASSESSMENT:  CLINICAL IMPRESSION: 01/18/24:  Patient reports is doing a lot of work on steps at home.  She is unable to do an eccentric step down from an 8 step and lacks eccentric control on short 4 step with step downs.  Need further work on higher step ups and eccentric control as well as remaining knee flexion deficits.  Continue per POC  EVAL:  Halei Hanover is a 58 y.o. female who was referred to physical therapy for evaluation and treatment following a R TKA by Dr Jerri 2 weeks ago.   Patient reports onset of R knee pain beginning at least a  year ago. Pain is worse with R knee flexion and extension and with FWB unsupported on RLE.  Patient has deficits in R knee ROM, R LE flexibility, RLE strength, balance, safety, and HEP understanding which are interfering with ADLs and  are impacting quality of life.  On LEFS patient scored 49/80 demonstrating 38.75% functional limitation.  Kari will benefit from skilled PT to address above deficits to improve mobility and activity tolerance with decreased pain interference.  OBJECTIVE IMPAIRMENTS: Abnormal gait, decreased balance, decreased knowledge of use of DME, decreased mobility, difficulty walking, decreased ROM, decreased strength, decreased safety awareness, increased edema, increased muscle spasms, impaired flexibility, and pain.   ACTIVITY LIMITATIONS: carrying, lifting, bending, standing, squatting, sleeping, stairs, and locomotion level  PARTICIPATION LIMITATIONS: meal prep, cleaning, laundry, driving, shopping, and community activity  PERSONAL FACTORS: Age, Fitness, and 1-2 comorbidities: Bronchitis, Diabetes, Htn, OA are also affecting patient's functional outcome.   REHAB POTENTIAL: Good  CLINICAL DECISION MAKING: Evolving/moderate complexity  EVALUATION COMPLEXITY: Moderate   GOALS: Goals reviewed with patient? Yes  SHORT TERM GOALS: Target date: 01/03/2024  Patient will be independent with initial HEP. Baseline: 100% PT assist required for correct completion  Goal status: MET  2.  Patient will report at least 25% improvement in R knee pain to improve QOL. Baseline: 6/10 average, 8/10 worst 12/26/23--3/10 worst over last 5 days  Goal status: MET  LONG TERM GOALS: Target date: 01/31/2024  Patient will be independent with advanced/ongoing HEP to improve outcomes and carryover.  Baseline: no advanced HEP yet Goal status: IN PROGRESS- 01/03/24 met for current HEP  2.  Patient will report at least 50-75% improvement in R knee pain to improve QOL. Baseline: 6-8/10 Goal  status: MET  3.  Patient will demonstrate improved R knee AROM to 0-120 degrees to allow for normal gait and stair mechanics. Baseline: Refer to above LE ROM table 12/26/23 : 0-100 degrees R knee 01/15/24:  -2 to 110 degrees R knee Goal status: IN PROGRESS- 01/03/24 108 knee flexion  4.  Patient will demonstrate improved R knee and RLE strength to >/= 4+/5 for improved stability and ease of mobility. Baseline: Refer to above LE MMT table Goal status: PARTIALLY MET- 01/03/24 see table  5.  Patient will be able to ambulate x 10-20 minutes with no device, and normal gait pattern without increased pain to access community for shopping Baseline: 200 feet with walker Goal status: IN PROGRESS  6. Patient will be able to ascend/descend stairs with 1 HR and reciprocal step pattern safely to access home and community.  Baseline: both hands required and 2 foot on 1 step advancedment Goal status: IN PROGRESS- 01/03/24 see treatment  7.  Patient will report >/= 60/80 on LEFS (MCID = 9 pts) to demonstrate improved functional ability. Baseline: 49  Goal status: IN PROGRESS- 50 / 80 = 62.5 % 01/09/24  8.  Patient will demonstrate at least 20/24 on DGI to decrease risk of falls. Baseline: 16/20 Goal status: MET- 01/03/24      PLAN:  PT FREQUENCY: 2x/week  PT DURATION: 8 weeks  PLANNED INTERVENTIONS: {97164- PT Re-evaluation, 97750- Physical Performance Testing, 97110-Therapeutic exercises, 97530- Therapeutic activity, 97112- Neuromuscular re-education, 97535- Self Care, [id number]- Manual therapy, [phone]- Gait training, [phone]- Electrical stimulation (unattended), 97016- Vasopneumatic device, 20560 (1-2 muscles), 20561 (3+ muscles)- Dry Needling, Patient/Family education, Balance training, Stair training, Taping, Joint mobilization, DME instructions, Cryotherapy, Moist heat, and Biofeedback  PLAN FOR NEXT SESSION: continue to work on knee flexion and eccentric control  Markeya Mincy, PT 01/18/2024, 9:31  PM

## 2024-01-23 ENCOUNTER — Ambulatory Visit: Admitting: Rehabilitation

## 2024-01-23 DIAGNOSIS — M25661 Stiffness of right knee, not elsewhere classified: Secondary | ICD-10-CM

## 2024-01-23 DIAGNOSIS — M6281 Muscle weakness (generalized): Secondary | ICD-10-CM

## 2024-01-23 DIAGNOSIS — M25561 Pain in right knee: Secondary | ICD-10-CM

## 2024-01-23 DIAGNOSIS — R600 Localized edema: Secondary | ICD-10-CM

## 2024-01-23 DIAGNOSIS — R262 Difficulty in walking, not elsewhere classified: Secondary | ICD-10-CM

## 2024-01-23 NOTE — Therapy (Signed)
 OUTPATIENT PHYSICAL THERAPY LOWER EXTREMITY TREATMENT   Patient Name: Ann Foster MRN: [id number] DOB: [birth year], 58 y.o., female Today's Date: 01/23/2024   END OF SESSION:  PT End of Session - 01/23/24 1021     Visit Number 16    Date for PT Re-Evaluation 01/31/24    Authorization Type [employer] Dual complete    PT Start Time 1019    PT Stop Time 1120    PT Time Calculation (min) 61 min    Activity Tolerance No increased pain;Patient tolerated treatment well    Behavior During Therapy [facility] for tasks assessed/performed            Past Medical History:  Diagnosis Date   Ankle fracture 2016   Right   Aortic atherosclerosis (HCC)    trace calcific atherosclerosis aortic arch per ct neck done 12-22-17   Bronchitis    Diabetes mellitus without complication (HCC)    Hypertension    OA (osteoarthritis) of shoulder    left shoulder, both knees arthritis   Osteoarthritis, knee    Sleep apnea    wears CPAP   Past Surgical History:  Procedure Laterality Date   CHOLECYSTECTOMY N/A 04/15/2023   Procedure: LAPAROSCOPIC CHOLECYSTECTOMY WITH ICG DYE;  Surgeon: Ebbie Cough, MD;  Location: [facility];  Service: General;  Laterality: N/A;   COLONOSCOPY     TOTAL KNEE ARTHROPLASTY Right 11/20/2023   Procedure: ARTHROPLASTY, KNEE, TOTAL RIGHT;  Surgeon: Jerri Kay HERO, MD;  Location: [facility] OR;  Service: Orthopedics;  Laterality: Right;   Patient Active Problem List   Diagnosis Date Noted   Status post total right knee replacement 11/20/2023   Primary osteoarthritis of right knee 09/26/2023   Snoring 06/07/2023   Acute cholecystitis 04/13/2023   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, with long-term current use of insulin  (HCC) 04/25/2022   Amblyopia of eye, right 07/27/2021   Corneal guttata of left eye 07/27/2021   Influenza vaccine refused 07/29/2020   COVID-19 vaccine dose declined 07/29/2020   Vasomotor symptoms  due to menopause 04/24/2020   Atrophic vaginitis 04/24/2020   Impingement syndrome of left shoulder 01/23/2018    PCP: Theotis Haze ORN, NP   REFERRING PROVIDER: Jerri Kay HERO, MD   REFERRING DIAG:  M17.11 (ICD-10-CM) - Primary osteoarthritis of right knee  THERAPY DIAG:  Acute pain of right knee  Stiffness of right knee, not elsewhere classified  Difficulty in walking, not elsewhere classified  Muscle weakness (generalized)  Localized edema  RATIONALE FOR EVALUATION AND TREATMENT: Rehabilitation  ONSET DATE: 11/20/23  NEXT MD VISIT: 01/02/24 Dr Jerri.   SUBJECTIVE:  SUBJECTIVE STATEMENT: Patient reports feeling progressively better.  She is ambulating into clinic with no device now without a limp.  Her biggest c/o and limitations are going down stairs with reciprocal pattern and good control and with R knee flexion ROM.  She is ok with next visit being D/C visit.   She is ready to schedule her L TKA when ortho will let her.  Patient referred to PT following  R TKA on by DR XU on 11/20/23.   She has had 2 weeks HH PT.     She is also c/o nerve pain in the R hip and anterolateral thigh that has been present since surgery and is attributed to the nerve block.  She had her bandage removed yesterday and has steri strips in place.  There is no redness/drainage/dehischence noted.  Calf edema is minimal.  Homan's is negative RLE.   Patient has end stage L knee OA as well and hopes to the L TKA done before the year is out once she is recovered from this surgery.  She has been walker dependent since surgery, but no falls.  PAIN: Are you having pain? Yes: NPRS scale: 3/10 Pain location: R knee Pain description: dull to sharp variably  Aggravating factors: end ROM flex/ext; prlonged  walking Relieving factors: ice, meds, rest, elevation  PERTINENT HISTORY:  Bronchitis, Diabetes, Htn, OA  PRECAUTIONS: None  RED FLAGS: None  WEIGHT BEARING RESTRICTIONS: No  FALLS:  Has patient fallen in last 6 months? No  LIVING ENVIRONMENT: Lives with: lives with their family Lives in: House/apartment Stairs: Yes: External: 2 steps; none Has following equipment at home: Walker - 2 wheeled, shower chair, and bed side commode  OCCUPATION:  disabled  PLOF: Independentused cane some prior to surgery due to pain, but normally independent with no device  PATIENT GOALS: walk normally without pain and get my other knee done   OBJECTIVE: (objective measures completed at initial evaluation unless otherwise dated)  PATIENT SURVEYS:  LEFS = 49/80  COGNITION: Overall cognitive status: Within functional limits for tasks assessed    SENSATION: WFL  EDEMA:  Circumferential: 42.5 cm on L knee; 44.5 cm on R knee 7/18: LLE =  43.5; RLE = 45 cm  POSTURE:  No Significant postural limitations  PALPATION: TTP over the medial and lateral R knee, along with R mid to distal IT band, and distal quads  MUSCLE LENGTH: Hamstrings: Right 80 deg; Left 90  deg Hamstrings: 90 degrees + SLR BLE ITB: mild tightness RLE Heelcord: mild tight RLE  LOWER EXTREMITY ROM:  Active ROM Right eval Left eval R 01/03/24  Hip flexion     Hip extension     Hip abduction     Hip adduction     Hip internal rotation     Hip external rotation     Knee flexion 70 120 108  Knee extension -10 0 0  Ankle dorsiflexion 0 5   Ankle plantarflexion     Ankle inversion     Ankle eversion      Passive ROM Right eval Left eval  Hip flexion    Hip extension    Hip abduction    Hip adduction    Hip internal rotation    Hip external rotation    Knee flexion 80   Knee extension -6   Ankle dorsiflexion    Ankle plantarflexion    Ankle inversion    Ankle eversion    (Blank rows = not tested)  LOWER  EXTREMITY MMT:  MMT Right eval Left eval R 01/03/24  Hip flexion 4- 4+ 5  Hip extension     Hip abduction 4- 4+ 4+  Hip adduction     Hip internal rotation 3+ 4 4  Hip external rotation 4- 5 4+  Knee flexion 4+ 5 4+  Knee extension 4- 5 4+  Ankle dorsiflexion 4 4   Ankle plantarflexion     Ankle inversion     Ankle eversion      (Blank rows = not tested)   FUNCTIONAL TESTS:  5x STS = 22.41 TUG = 18.39 DGI =  16   GAIT: Distance walked: 200 feet into clinic Assistive device utilized: Walker - 2 wheeled Level of assistance: Complete Independence Gait pattern: decreased stance time- Right and antalgic Comments: ambulates independently with FWW with decreased R knee flexion in swing, decreased stance time RLE with mild limp and decreased WB RLE;   can ambulate with a SPC but more pronounced limp and requires min gait belt assist for safety;   able to ambulate down/up 1 flight steps but has to ambulate 2 feet to same step using BUE on same rail   TODAY'S TREATMENT:  01/23/24 THERAPEUTIC EXERCISE: To improve ROM.  Demonstration, verbal and tactile cues throughout for technique. Bike full RPM x 8' seat #6  NEUROMUSCULAR RE-EDUCATION: To improve balance. Foam step up to SLS 8 no UE support x 3/10 RLE Eccentric control forward step downs from aerobic step with foam x 3/10 intermittently able to do without UE support but needs CGA Forward lunge no UE support x 3/10 with SBA and vc for foot placement and knee position and balance maintenance Supine SKTC knee flexion stretch with contract relax and overpressure by PT x 3 trials Supine modified Thomas hip flexor stretch w/ PT assisted knee flexion and contract-relax-overpressure x 2 trials  THERAPEUTIC ACTIVITIES: To improve functional performance.  Demonstration, verbal and tactile cues throughout for technique. Seated knee extension 10# x 3/10 RLE only Seated knee flexion 15# x 3/10 RLE mostly  MODALITIES: Vasopneumatic to R knee  at min med compression x 15' at 34 degrees  01/18/24 THERAPEUTIC EXERCISE: To improve ROM.  Demonstration, verbal and tactile cues throughout for technique. NuStep L4 x 8'  NEUROMUSCULAR RE-EDUCATION: To improve balance. 8 step ups x 2/10 RLE Eccentric step downs from 6 x 2/10 RLE Resisted gait blue TB F/B, backward/forward, S/S each direction at counter x 3 laps each Prone PNF knee flexion stretch with contract relax contract and contract relax techniques  THERAPEUTIC ACTIVITIES: To improve functional performance.  Demonstration, verbal and tactile cues throughout for technique. Leg press 15# 2x10 BLE Leg ext 10# x 3/10 RLE Leg curls 10# 3x10 RLE SKTC flexion stretch x 1' x 2 RLE Prone knee flexion AROM X 20 RLE  Vasopneumatic to R knee at min med compression x 10' at 34 degrees. PATIENT EDUCATION:  Education details: using pillow or foam for step ups to challenge balance  Person educated: Patient Education method: Explanation, Demonstration, Verbal cues, Tactile cues, and MedBridge[employer] app access provided Education comprehension: verbalized understanding, verbal cues required, tactile cues required, and needs further education  HOME EXERCISE PROGRAM: Access Code: [email or web address] URL: [email or web address] Date: 12/06/2023 Prepared by: Garnette Montclair  Exercises - Supine Ankle Pumps  - 3 x daily - 7 x weekly - 1 sets - 10 reps - Supine Quad Set  - 3 x daily - 7 x weekly - 1 sets - 10 reps - Straight Leg  Raise  - 3 x daily - 7 x weekly - 1 sets - 10 reps - Supine Heel Slides  - 3 x daily - 7 x weekly - 1 sets - 10 reps - Supine Hip Abduction  - 3 x daily - 7 x weekly - 1 sets - 10 reps - Seated Long Arc Quad  - 3 x daily - 7 x weekly - 1 sets - 10 reps - Seated Knee Flexion  - 3 x daily - 7 x weekly - 1 sets - 10 reps - Supine Knee Extension Strengthening  - 1 x daily - 7 x weekly - 3 sets - 10 reps - Standing Heel Raise with Support  - 1 x daily - 7 x weekly - 3 sets  - 5 reps - Toe Raises with Counter Support  - 1 x daily - 7 x weekly - 3 sets - 5 reps - Mini Squats at Table  - 1 x daily - 7 x weekly - 3 sets - 5 reps - Standing Hip Flexion AROM  - 1 x daily - 7 x weekly - 3 sets - 5 reps - Standing Hip Abduction  - 1 x daily - 7 x weekly - 3 sets - 5 reps - Standing Hip Extension  - 1 x daily - 7 x weekly - 3 sets - 5 reps - Standing Knee Flexion AROM with Chair Support  - 1 x daily - 7 x weekly - 3 sets - 5 reps   ASSESSMENT:  CLINICAL IMPRESSION: Patient doing better with eccentric quad control RLE.  Still some difficulty but should be able to attain full control by next visit.   D/C is planned for next visit due to all goals met except for knee flexion which is slow to progress.   She may need to speak with ortho about a JAS stretch splint.   She should meet all goals by next visit nad is agreeable to d/c  EVAL:  Agueda Houpt is a 58 y.o. female who was referred to physical therapy for evaluation and treatment following a R TKA by Dr Jerri 2 weeks ago.   Patient reports onset of R knee pain beginning at least a year ago. Pain is worse with R knee flexion and extension and with FWB unsupported on RLE.  Patient has deficits in R knee ROM, R LE flexibility, RLE strength, balance, safety, and HEP understanding which are interfering with ADLs and are impacting quality of life.  On LEFS patient scored 49/80 demonstrating 38.75% functional limitation.  Lucie will benefit from skilled PT to address above deficits to improve mobility and activity tolerance with decreased pain interference.  OBJECTIVE IMPAIRMENTS: Abnormal gait, decreased balance, decreased knowledge of use of DME, decreased mobility, difficulty walking, decreased ROM, decreased strength, decreased safety awareness, increased edema, increased muscle spasms, impaired flexibility, and pain.   ACTIVITY LIMITATIONS: carrying, lifting, bending, standing, squatting, sleeping, stairs, and locomotion  level  PARTICIPATION LIMITATIONS: meal prep, cleaning, laundry, driving, shopping, and community activity  PERSONAL FACTORS: Age, Fitness, and 1-2 comorbidities: Bronchitis, Diabetes, Htn, OA are also affecting patient's functional outcome.   REHAB POTENTIAL: Good  CLINICAL DECISION MAKING: Evolving/moderate complexity  EVALUATION COMPLEXITY: Moderate   GOALS: Goals reviewed with patient? Yes  SHORT TERM GOALS: Target date: 01/03/2024  Patient will be independent with initial HEP. Baseline: 100% PT assist required for correct completion  Goal status: MET  2.  Patient will report at least 25% improvement in R knee pain  to improve QOL. Baseline: 6/10 average, 8/10 worst 12/26/23--3/10 worst over last 5 days  Goal status: MET  LONG TERM GOALS: Target date: 01/31/2024  Patient will be independent with advanced/ongoing HEP to improve outcomes and carryover.  Baseline: no advanced HEP yet Goal status: MET- 01/03/24 met for current HEP  2.  Patient will report at least 50-75% improvement in R knee pain to improve QOL. Baseline: 6-8/10 Goal status: MET  3.  Patient will demonstrate improved R knee AROM to 0-120 degrees to allow for normal gait and stair mechanics. Baseline: Refer to above LE ROM table 12/26/23 : 0-100 degrees R knee 01/15/24:  -2 to 110 degrees R knee Goal status: IN PROGRESS- 01/03/24 108 knee flexion  4.  Patient will demonstrate improved R knee and RLE strength to >/= 4+/5 for improved stability and ease of mobility. Baseline: Refer to above LE MMT table Goal status: PARTIALLY MET- 01/03/24 see table  5.  Patient will be able to ambulate x 10-20 minutes with no device, and normal gait pattern without increased pain to access community for shopping Baseline: 200 feet with walker Goal status: MET;  8/26:  no device, indoors, outdoors  6. Patient will be able to ascend/descend stairs with 1 HR and reciprocal step pattern safely to access home and community.  Baseline:  both hands required and 2 foot on 1 step advancedment Goal status: MET- 01/03/24 see treatment;  8/25 can do steps reciprocally with intermittent 1 rail support  7.  Patient will report >/= 60/80 on LEFS (MCID = 9 pts) to demonstrate improved functional ability. Baseline: 49  Goal status: IN PROGRESS- 50 / 80 = 62.5 % 01/09/24  8.  Patient will demonstrate at least 20/24 on DGI to decrease risk of falls. Baseline: 16/20 Goal status: MET- 01/03/24      PLAN:  PT FREQUENCY: 2x/week  PT DURATION: 8 weeks  PLANNED INTERVENTIONS: {97164- PT Re-evaluation, 97750- Physical Performance Testing, 97110-Therapeutic exercises, 97530- Therapeutic activity, 97112- Neuromuscular re-education, 97535- Self Care, 02859- Manual therapy, [phone]- Gait training, [phone]- Electrical stimulation (unattended), 97016- Vasopneumatic device, 20560 (1-2 muscles), 20561 (3+ muscles)- Dry Needling, Patient/Family education, Balance training, Stair training, Taping, Joint mobilization, DME instructions, Cryotherapy, Moist heat, and Biofeedback  PLAN FOR NEXT SESSION: Review all flexion stretching and plan to d/c next visit.  Patient is prepared and agreeable  Saige Canton, PT 01/23/2024, 1:20 PM

## 2024-01-25 ENCOUNTER — Ambulatory Visit: Admitting: Rehabilitation

## 2024-01-25 DIAGNOSIS — M25561 Pain in right knee: Secondary | ICD-10-CM

## 2024-01-25 DIAGNOSIS — M25661 Stiffness of right knee, not elsewhere classified: Secondary | ICD-10-CM

## 2024-01-25 DIAGNOSIS — M6281 Muscle weakness (generalized): Secondary | ICD-10-CM

## 2024-01-25 DIAGNOSIS — R262 Difficulty in walking, not elsewhere classified: Secondary | ICD-10-CM

## 2024-01-25 DIAGNOSIS — R600 Localized edema: Secondary | ICD-10-CM

## 2024-01-25 NOTE — Therapy (Signed)
 OUTPATIENT PHYSICAL THERAPY LOWER EXTREMITY TREATMENT / DC SUMMARY   Patient Name: Ann Foster MRN: [id number] DOB: [birth year], 58 y.o., female Today's Date: 01/25/2024   END OF SESSION:  PT End of Session - 01/25/24 1048     Visit Number 17    Date for PT Re-Evaluation 01/31/24    Authorization Type [employer] Dual complete    PT Start Time 1020    PT Stop Time 1113    PT Time Calculation (min) 53 min    Activity Tolerance No increased pain;Patient tolerated treatment well    Behavior During Therapy [facility] for tasks assessed/performed            Past Medical History:  Diagnosis Date   Ankle fracture 2016   Right   Aortic atherosclerosis (HCC)    trace calcific atherosclerosis aortic arch per ct neck done 12-22-17   Bronchitis    Diabetes mellitus without complication (HCC)    Hypertension    OA (osteoarthritis) of shoulder    left shoulder, both knees arthritis   Osteoarthritis, knee    Sleep apnea    wears CPAP   Past Surgical History:  Procedure Laterality Date   CHOLECYSTECTOMY N/A 04/15/2023   Procedure: LAPAROSCOPIC CHOLECYSTECTOMY WITH ICG DYE;  Surgeon: Ebbie Cough, MD;  Location: [facility];  Service: General;  Laterality: N/A;   COLONOSCOPY     TOTAL KNEE ARTHROPLASTY Right 11/20/2023   Procedure: ARTHROPLASTY, KNEE, TOTAL RIGHT;  Surgeon: Jerri Kay HERO, MD;  Location: [facility] OR;  Service: Orthopedics;  Laterality: Right;   Patient Active Problem List   Diagnosis Date Noted   Status post total right knee replacement 11/20/2023   Primary osteoarthritis of right knee 09/26/2023   Snoring 06/07/2023   Acute cholecystitis 04/13/2023   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, with long-term current use of insulin  (HCC) 04/25/2022   Amblyopia of eye, right 07/27/2021   Corneal guttata of left eye 07/27/2021   Influenza vaccine refused 07/29/2020   COVID-19 vaccine dose declined 07/29/2020    Vasomotor symptoms due to menopause 04/24/2020   Atrophic vaginitis 04/24/2020   Impingement syndrome of left shoulder 01/23/2018    PCP: Theotis Haze ORN, NP   REFERRING PROVIDER: Jerri Kay HERO, MD   REFERRING DIAG:  M17.11 (ICD-10-CM) - Primary osteoarthritis of right knee  THERAPY DIAG:  Acute pain of right knee  Stiffness of right knee, not elsewhere classified  Difficulty in walking, not elsewhere classified  Muscle weakness (generalized)  Localized edema  RATIONALE FOR EVALUATION AND TREATMENT: Rehabilitation  ONSET DATE: 11/20/23  NEXT MD VISIT: 01/02/24 Dr Jerri.   SUBJECTIVE:  SUBJECTIVE STATEMENT: Patient reports feeling progressively better.  She is ambulating into clinic with no device now without a limp.  Her biggest c/o and limitations are going down stairs with reciprocal pattern and good control and with R knee flexion ROM.  She is ok with next visit being D/C visit.   She is ready to schedule her L TKA when ortho will let her.  Patient referred to PT following  R TKA on by DR XU on 11/20/23.   She has had 2 weeks HH PT.     She is also c/o nerve pain in the R hip and anterolateral thigh that has been present since surgery and is attributed to the nerve block.  She had her bandage removed yesterday and has steri strips in place.  There is no redness/drainage/dehischence noted.  Calf edema is minimal.  Homan's is negative RLE.   Patient has end stage L knee OA as well and hopes to the L TKA done before the year is out once she is recovered from this surgery.  She has been walker dependent since surgery, but no falls.  PAIN: Are you having pain? Yes: NPRS scale: 3/10 Pain location: R knee Pain description: dull to sharp variably  Aggravating factors: end ROM flex/ext;  prlonged walking Relieving factors: ice, meds, rest, elevation  PERTINENT HISTORY:  Bronchitis, Diabetes, Htn, OA  PRECAUTIONS: None  RED FLAGS: None  WEIGHT BEARING RESTRICTIONS: No  FALLS:  Has patient fallen in last 6 months? No  LIVING ENVIRONMENT: Lives with: lives with their family Lives in: House/apartment Stairs: Yes: External: 2 steps; none Has following equipment at home: Walker - 2 wheeled, shower chair, and bed side commode  OCCUPATION:  disabled  PLOF: Independentused cane some prior to surgery due to pain, but normally independent with no device  PATIENT GOALS: walk normally without pain and get my other knee done   OBJECTIVE: (objective measures completed at initial evaluation unless otherwise dated)  PATIENT SURVEYS:  LEFS = 49/80  COGNITION: Overall cognitive status: Within functional limits for tasks assessed    SENSATION: WFL  EDEMA:  Circumferential: 42.5 cm on L knee; 44.5 cm on R knee 7/18: LLE =  43.5; RLE = 45 cm  POSTURE:  No Significant postural limitations  PALPATION: TTP over the medial and lateral R knee, along with R mid to distal IT band, and distal quads  MUSCLE LENGTH: Hamstrings: Right 80 deg; Left 90  deg Hamstrings: 90 degrees + SLR BLE ITB: mild tightness RLE Heelcord: mild tight RLE  LOWER EXTREMITY ROM:  Active ROM Right eval Left eval R 01/03/24  Hip flexion     Hip extension     Hip abduction     Hip adduction     Hip internal rotation     Hip external rotation     Knee flexion 70 120 108  Knee extension -10 0 0  Ankle dorsiflexion 0 5   Ankle plantarflexion     Ankle inversion     Ankle eversion      Passive ROM Right eval Left eval  Hip flexion    Hip extension    Hip abduction    Hip adduction    Hip internal rotation    Hip external rotation    Knee flexion 80   Knee extension -6   Ankle dorsiflexion    Ankle plantarflexion    Ankle inversion    Ankle eversion    (Blank rows = not  tested)  LOWER EXTREMITY MMT:  MMT Right eval Left eval R 01/03/24  Hip flexion 4- 4+ 5  Hip extension     Hip abduction 4- 4+ 4+  Hip adduction     Hip internal rotation 3+ 4 4  Hip external rotation 4- 5 4+  Knee flexion 4+ 5 4+  Knee extension 4- 5 4+  Ankle dorsiflexion 4 4   Ankle plantarflexion     Ankle inversion     Ankle eversion      (Blank rows = not tested)   FUNCTIONAL TESTS:  5x STS = 22.41 TUG = 18.39 DGI =  16   GAIT: Distance walked: 200 feet into clinic Assistive device utilized: Walker - 2 wheeled Level of assistance: Complete Independence Gait pattern: decreased stance time- Right and antalgic Comments: ambulates independently with FWW with decreased R knee flexion in swing, decreased stance time RLE with mild limp and decreased WB RLE;   can ambulate with a SPC but more pronounced limp and requires min gait belt assist for safety;   able to ambulate down/up 1 flight steps but has to ambulate 2 feet to same step using BUE on same rail   TODAY'S TREATMENT:  01/25/24 THERAPEUTIC EXERCISE: To improve ROM.  Demonstration, verbal and tactile cues throughout for technique. Bike x 10' L4  NEUROMUSCULAR RE-EDUCATION: To improve balance. 8 step up to foam and SLS x 3/10 RLE 6 step downs x 3/10 RLE Monster walk blue TB 1/2 gym lap x 4  THERAPEUTIC ACTIVITIES: To improve functional performance.  Demonstration, verbal and tactile cues throughout for technique. Seated knee ext 10# x 3/10 RLE Seated kne flexion 15# x 3/10 RLE SKTC flexion stretch for ROM measure R knee ROM measurement:  0-105 degrees Sit n scoot flexion stretch  PHYSICAL PERFORMANCE TEST or MEASUREMENT: Gait speed recheck is 3.01 ft/sec   [facility] PT Assessment - 01/25/24 0001       Standardized Balance Assessment   Standardized Balance Assessment Berg Balance Test      Berg Balance Test   Sit to Stand Able to stand without using hands and stabilize independently    Standing Unsupported  Able to stand safely 2 minutes    Sitting with Back Unsupported but Feet Supported on Floor or Stool Able to sit safely and securely 2 minutes    Stand to Sit Sits safely with minimal use of hands    Transfers Able to transfer safely, minor use of hands    Standing Unsupported with Eyes Closed Able to stand 10 seconds safely    Standing Unsupported with Feet Together Able to place feet together independently and stand 1 minute safely    From Standing, Reach Forward with Outstretched Arm Can reach confidently >25 cm (10)    From Standing Position, Pick up Object from Floor Able to pick up shoe safely and easily    From Standing Position, Turn to Look Behind Over each Shoulder Looks behind from both sides and weight shifts well    Turn 360 Degrees Able to turn 360 degrees safely in 4 seconds or less    Standing Unsupported, Alternately Place Feet on Step/Stool Able to stand independently and safely and complete 8 steps in 20 seconds    Standing Unsupported, One Foot in Front Able to place foot tandem independently and hold 30 seconds    Standing on One Leg Tries to lift leg/unable to hold 3 seconds but remains standing independently    Total Score 53  Dynamic Gait Index   Level Surface Normal    Change in Gait Speed Normal    Gait with Horizontal Head Turns Normal    Gait with Vertical Head Turns Normal    Gait and Pivot Turn Normal    Step Over Obstacle Mild Impairment    Step Around Obstacles Normal    Steps Mild Impairment    Total Score 22           01/23/24 THERAPEUTIC EXERCISE: To improve ROM.  Demonstration, verbal and tactile cues throughout for technique. Bike full RPM x 8' seat #6  NEUROMUSCULAR RE-EDUCATION: To improve balance. Foam step up to SLS 8 no UE support x 3/10 RLE Eccentric control forward step downs from aerobic step with foam x 3/10 intermittently able to do without UE support but needs CGA Forward lunge no UE support x 3/10 with SBA and vc for foot  placement and knee position and balance maintenance Supine SKTC knee flexion stretch with contract relax and overpressure by PT x 3 trials Supine modified Thomas hip flexor stretch w/ PT assisted knee flexion and contract-relax-overpressure x 2 trials  THERAPEUTIC ACTIVITIES: To improve functional performance.  Demonstration, verbal and tactile cues throughout for technique. Seated knee extension 10# x 3/10 RLE only Seated knee flexion 15# x 3/10 RLE mostly  MODALITIES: Vasopneumatic to R knee at min med compression x 15' at 34 degrees  01/18/24 THERAPEUTIC EXERCISE: To improve ROM.  Demonstration, verbal and tactile cues throughout for technique. NuStep L4 x 8'  NEUROMUSCULAR RE-EDUCATION: To improve balance. 8 step ups x 2/10 RLE Eccentric step downs from 6 x 2/10 RLE Resisted gait blue TB F/B, backward/forward, S/S each direction at counter x 3 laps each Prone PNF knee flexion stretch with contract relax contract and contract relax techniques  THERAPEUTIC ACTIVITIES: To improve functional performance.  Demonstration, verbal and tactile cues throughout for technique. Leg press 15# 2x10 BLE Leg ext 10# x 3/10 RLE Leg curls 10# 3x10 RLE SKTC flexion stretch x 1' x 2 RLE Prone knee flexion AROM X 20 RLE  Vasopneumatic to R knee at min med compression x 10' at 34 degrees. PATIENT EDUCATION:  Education details: using pillow or foam for step ups to challenge balance  Person educated: Patient Education method: Explanation, Demonstration, Verbal cues, Tactile cues, and MedBridgeGO app access provided Education comprehension: verbalized understanding, verbal cues required, tactile cues required, and needs further education  HOME EXERCISE PROGRAM: Access Code: [email or web address] URL: [email or web address] Date: 12/06/2023 Prepared by: Garnette Montclair  Exercises - Supine Ankle Pumps  - 3 x daily - 7 x weekly - 1 sets - 10 reps - Supine Quad Set  - 3 x daily - 7 x weekly - 1  sets - 10 reps - Straight Leg Raise  - 3 x daily - 7 x weekly - 1 sets - 10 reps - Supine Heel Slides  - 3 x daily - 7 x weekly - 1 sets - 10 reps - Supine Hip Abduction  - 3 x daily - 7 x weekly - 1 sets - 10 reps - Seated Long Arc Quad  - 3 x daily - 7 x weekly - 1 sets - 10 reps - Seated Knee Flexion  - 3 x daily - 7 x weekly - 1 sets - 10 reps - Supine Knee Extension Strengthening  - 1 x daily - 7 x weekly - 3 sets - 10 reps - Standing Heel Raise with Support  - 1 x daily - 7  x weekly - 3 sets - 5 reps - Toe Raises with Counter Support  - 1 x daily - 7 x weekly - 3 sets - 5 reps - Mini Squats at Table  - 1 x daily - 7 x weekly - 3 sets - 5 reps - Standing Hip Flexion AROM  - 1 x daily - 7 x weekly - 3 sets - 5 reps - Standing Hip Abduction  - 1 x daily - 7 x weekly - 3 sets - 5 reps - Standing Hip Extension  - 1 x daily - 7 x weekly - 3 sets - 5 reps - Standing Knee Flexion AROM with Chair Support  - 1 x daily - 7 x weekly - 3 sets - 5 reps   ASSESSMENT:  CLINICAL IMPRESSION: Patient has been seen x 2 months PT following a R TKA by Dr Jerri.   She has done well and is ready for d/c.   She is transferring and ambulating independently with no device.   She can ambulate up/down 1 flight of steps with light rail support on 1 side.   Strength has improved to 4+ to / RLE.   All balance testing is WNL for her age.   The only thing she still lacks is R knee flexion which is limited to 105 degrees.  Pain with stretching has been a barrier for her and still is.   She is advised to speak with ortho about a JAS splint vs. Manipulation for further ROM.  Otherwise she has met all of her goals at this time.    Patient is independent with all home exercises and advised to continue daily as tolerated and call us  with any questions.   D/C PT  EVAL:  Rene Gonsoulin is a 58 y.o. female who was referred to physical therapy for evaluation and treatment following a R TKA by Dr Jerri 2 weeks ago.   Patient reports  onset of R knee pain beginning at least a year ago. Pain is worse with R knee flexion and extension and with FWB unsupported on RLE.  Patient has deficits in R knee ROM, R LE flexibility, RLE strength, balance, safety, and HEP understanding which are interfering with ADLs and are impacting quality of life.  On LEFS patient scored 49/80 demonstrating 38.75% functional limitation.  Jamaiyah will benefit from skilled PT to address above deficits to improve mobility and activity tolerance with decreased pain interference.  OBJECTIVE IMPAIRMENTS: Abnormal gait, decreased balance, decreased knowledge of use of DME, decreased mobility, difficulty walking, decreased ROM, decreased strength, decreased safety awareness, increased edema, increased muscle spasms, impaired flexibility, and pain.   ACTIVITY LIMITATIONS: carrying, lifting, bending, standing, squatting, sleeping, stairs, and locomotion level  PARTICIPATION LIMITATIONS: meal prep, cleaning, laundry, driving, shopping, and community activity  PERSONAL FACTORS: Age, Fitness, and 1-2 comorbidities: Bronchitis, Diabetes, Htn, OA are also affecting patient's functional outcome.   REHAB POTENTIAL: Good  CLINICAL DECISION MAKING: Evolving/moderate complexity  EVALUATION COMPLEXITY: Moderate   GOALS: Goals reviewed with patient? Yes  SHORT TERM GOALS: Target date: 01/03/2024  Patient will be independent with initial HEP. Baseline: 100% PT assist required for correct completion  Goal status: MET  2.  Patient will report at least 25% improvement in R knee pain to improve QOL. Baseline: 6/10 average, 8/10 worst 12/26/23--3/10 worst over last 5 days  Goal status: MET  LONG TERM GOALS: Target date: 01/31/2024  Patient will be independent with advanced/ongoing HEP to improve outcomes and carryover.  Baseline: no advanced HEP yet Goal status: MET- 01/03/24 met for current HEP  2.  Patient will report at least 50-75% improvement in R knee pain to improve  QOL. Baseline: 6-8/10 Goal status: MET  3.  Patient will demonstrate improved R knee AROM to 0-120 degrees to allow for normal gait and stair mechanics. Baseline: Refer to above LE ROM table 12/26/23 : 0-100 degrees R knee 01/15/24:  -2 to 110 degrees R knee Goal status: IN PROGRESS- 01/03/24 108 knee flexion  4.  Patient will demonstrate improved R knee and RLE strength to >/= 4+/5 for improved stability and ease of mobility. Baseline: Refer to above LE MMT table Goal status: PARTIALLY MET- 01/03/24 see table  5.  Patient will be able to ambulate x 10-20 minutes with no device, and normal gait pattern without increased pain to access community for shopping Baseline: 200 feet with walker Goal status: MET;  8/26:  no device, indoors, outdoors  6. Patient will be able to ascend/descend stairs with 1 HR and reciprocal step pattern safely to access home and community.  Baseline: both hands required and 2 foot on 1 step advancedment Goal status: MET- 01/03/24 see treatment;  8/25 can do steps reciprocally with intermittent 1 rail support  7.  Patient will report >/= 60/80 on LEFS (MCID = 9 pts) to demonstrate improved functional ability. Baseline: 49  Goal status: IN PROGRESS- 50 / 80 = 62.5 % 01/09/24  8.  Patient will demonstrate at least 20/24 on DGI to decrease risk of falls. Baseline: 16/20 Goal status: MET- 01/03/24      PLAN:  PT FREQUENCY: 2x/week  PT DURATION: 8 weeks  PLANNED INTERVENTIONS: {97164- PT Re-evaluation, 97750- Physical Performance Testing, 97110-Therapeutic exercises, 97530- Therapeutic activity, 97112- Neuromuscular re-education, 97535- Self Care, 02859- Manual therapy, [phone]- Gait training, [phone]- Electrical stimulation (unattended), 97016- Vasopneumatic device, 20560 (1-2 muscles), 20561 (3+ muscles)- Dry Needling, Patient/Family education, Balance training, Stair training, Taping, Joint mobilization, DME instructions, Cryotherapy, Moist heat, and Biofeedback  PLAN  FOR NEXT SESSION: d/c PT   PHYSICAL THERAPY DISCHARGE SUMMARY  Visits from Start of Care: 17  Current functional level related to goals / functional outcomes: Independent all mobility and HEP;  4+ to 5/5 strength;    Remaining deficits: 0-105 degrees R knee ROM.   Unable to stretch into further flexion without severe pain   Education / Equipment: Patient is independent with all home exercises and advised to continue daily as tolerated and call us  with any questions    Patient agrees to discharge. Patient goals were partially met. Patient is being discharged due to maximized rehab potential.    Akshara Blumenthal, PT 01/25/2024, 11:43 AM

## 2024-01-30 ENCOUNTER — Telehealth: Payer: Self-pay | Admitting: *Deleted

## 2024-01-30 ENCOUNTER — Encounter

## 2024-01-30 NOTE — Telephone Encounter (Signed)
 Patient called and I spoke with her after being discharged from OPPT last week. RTKA on 11/20/23. Her flexion is at 105-108 and 0 extension. I think they mentioned to her having a JAS splint or MUA. She just wanted to ask me about this. She is scheduled for 02/13/24 for her 3 month f/u with you. I told her I'd pass along. I explained she is still early in healing and to continue with exercises and we will see her on 02/13/24 to see if she needs anything additional.

## 2024-01-30 NOTE — Telephone Encounter (Signed)
 I think it's fine to continue pushing things in PT since she is past 95 degrees of flexion

## 2024-02-01 ENCOUNTER — Encounter: Admitting: Rehabilitation

## 2024-02-09 ENCOUNTER — Telehealth: Payer: Self-pay | Admitting: Nurse Practitioner

## 2024-02-09 NOTE — Telephone Encounter (Signed)
 Contacted pt left vm to call back resch appt pcp not in the office we do have Dr.Newlin if she still wants to be seen

## 2024-02-12 ENCOUNTER — Telehealth: Payer: Self-pay | Admitting: Nurse Practitioner

## 2024-02-12 NOTE — Telephone Encounter (Signed)
 2nd attempt contacted pt left vm to resch appt or see another pcp.

## 2024-02-13 ENCOUNTER — Ambulatory Visit (INDEPENDENT_AMBULATORY_CARE_PROVIDER_SITE_OTHER): Admitting: Physician Assistant

## 2024-02-13 ENCOUNTER — Telehealth: Payer: Self-pay | Admitting: Nurse Practitioner

## 2024-02-13 DIAGNOSIS — Z96651 Presence of right artificial knee joint: Secondary | ICD-10-CM

## 2024-02-13 NOTE — Telephone Encounter (Signed)
 2nd attempt to contact pt to resch appt pcp not in the office on 10/31

## 2024-02-13 NOTE — Progress Notes (Signed)
 Post-Op Visit Note   Patient: Ann Foster           Date of Birth: 25-Mar-1966           MRN: [id number] Visit Date: 02/13/2024 PCP: Theotis Haze ORN, NP   Assessment & Plan:  Chief Complaint:  Chief Complaint  Patient presents with   Right Knee - Pain    Right total knee arthroplasty- 11/20/2023   Visit Diagnoses:  1. Status post total right knee replacement     Plan: Patient is a pleasant 58 year old female who comes in today 3 months status post right total knee replacement 11/20/2023.  She is still experiencing some pain.  She is taking primarily Tylenol  for this.  She has been in physical therapy making slow but steady progress.  PT did mention to her possible need for manipulation under anesthesia.  Examination of her right knee reveals range of motion from 0 to 108 degrees.  She is stable to valgus varus stress.  She does have a small to moderate effusion.  She is neurovascularly intact distally.  At this point, I do not feel she needs a formal manipulation under anesthesia.  I think that once her swelling subsides and she continues to work on her home exercise program, she will be able to achieve more flexion.  She understands and agrees.  She will follow-up with us  in 3 months for repeat evaluation and 2 view x-rays of the right knee.  Call with concerns or questions.  Follow-Up Instructions: Return in about 3 months (around 05/14/2024).   Orders:  No orders of the defined types were placed in this encounter.  No orders of the defined types were placed in this encounter.   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, with long-term current use of insulin   (HCC) 04/25/2022   Amblyopia of eye, right 07/27/2021   Corneal guttata of left eye 07/27/2021   Influenza vaccine refused 07/29/2020   COVID-19 vaccine dose declined 07/29/2020   Vasomotor symptoms due to menopause 04/24/2020   Atrophic vaginitis 04/24/2020   Impingement syndrome of left shoulder 01/23/2018   Past Medical History:  Diagnosis Date   Ankle fracture 2016   Right   Aortic atherosclerosis (HCC)    trace calcific atherosclerosis aortic arch per ct neck done 12-22-17   Bronchitis    Diabetes mellitus without complication (HCC)    Hypertension    OA (osteoarthritis) of shoulder    left shoulder, both knees arthritis   Osteoarthritis, knee    Sleep apnea    wears CPAP    Family History  Problem Relation Age of Onset   Breast cancer Mother    Colon cancer Father 8   Diabetes Son    Esophageal cancer Neg Hx    Stomach cancer Neg Hx    Liver disease Neg Hx    Pancreatic cancer Neg Hx    Rectal cancer Neg Hx     Past Surgical History:  Procedure Laterality Date   CHOLECYSTECTOMY N/A 04/15/2023   Procedure: LAPAROSCOPIC CHOLECYSTECTOMY WITH ICG DYE;  Surgeon: Ebbie Cough, MD;  Location: [facility];  Service: General;  Laterality: N/A;   COLONOSCOPY  TOTAL KNEE ARTHROPLASTY Right 11/20/2023   Procedure: ARTHROPLASTY, KNEE, TOTAL RIGHT;  Surgeon: Jerri Kay HERO, MD;  Location: [facility];  Service: Orthopedics;  Laterality: Right;   Social History   Occupational History   Not on file  Tobacco Use   Smoking status: Former    Current packs/day: 0.00    Types: Cigarettes    Quit date: 11/28/2019    Years since quitting: 4.2   Smokeless tobacco: Never  Vaping Use   Vaping status: Never Used  Substance and Sexual Activity   Alcohol use: Not Currently   Drug use: No   Sexual activity: Not Currently    Birth control/protection: Condom

## 2024-02-13 NOTE — Telephone Encounter (Signed)
 error

## 2024-02-15 ENCOUNTER — Other Ambulatory Visit: Payer: Self-pay | Admitting: Nurse Practitioner

## 2024-02-15 DIAGNOSIS — Z7985 Long-term (current) use of injectable non-insulin antidiabetic drugs: Secondary | ICD-10-CM

## 2024-02-19 ENCOUNTER — Telehealth: Payer: Self-pay | Admitting: Nurse Practitioner

## 2024-02-19 ENCOUNTER — Other Ambulatory Visit: Payer: Self-pay

## 2024-02-19 DIAGNOSIS — R399 Unspecified symptoms and signs involving the genitourinary system: Secondary | ICD-10-CM

## 2024-02-19 NOTE — Telephone Encounter (Signed)
 Patient advised to come into office to leave a urine sample for testing.

## 2024-02-19 NOTE — Telephone Encounter (Signed)
 Copied from CRM [id number]. Topic: Clinical - Medication Question >> Feb 19, 2024 10:32 AM Dedra B wrote:  Reason for CRM: Pt wants to know if she can have something called in for a uti to [place] on [address].

## 2024-02-20 ENCOUNTER — Telehealth: Payer: Self-pay | Admitting: Nurse Practitioner

## 2024-02-20 NOTE — Telephone Encounter (Signed)
 3rd attempt contacted pt left vm to call back to resch appt

## 2024-02-22 ENCOUNTER — Other Ambulatory Visit: Payer: Self-pay

## 2024-02-22 ENCOUNTER — Ambulatory Visit (INDEPENDENT_AMBULATORY_CARE_PROVIDER_SITE_OTHER): Admitting: Orthopaedic Surgery

## 2024-02-22 VITALS — Ht 63.5 in | Wt 194.0 lb

## 2024-02-22 DIAGNOSIS — M1712 Unilateral primary osteoarthritis, left knee: Secondary | ICD-10-CM

## 2024-02-22 NOTE — Progress Notes (Signed)
 Office Visit Note   Patient: Ann Foster           Date of Birth: Feb 20, 1966           MRN: [id number] Visit Date: 02/22/2024              Requested by: Theotis Haze ORN, NP [address] [place],  [place] [address] PCP: Theotis Haze ORN, NP   Assessment & Plan: Visit Diagnoses:  1. Primary osteoarthritis of left knee     Plan: History of Present Illness Ann Foster is a 58 year old female who presents with left knee pain and consideration of left knee replacement.  She experiences significant discomfort and functional limitation in her left knee, describing it as 'really like giving out'. She favors the left knee and is considering a knee replacement, similar to the procedure she underwent for her right knee approximately three months ago. She feels she has mostly recovered from the right knee replacement and is satisfied with the outcome, although she continues to work on flexibility and manage swelling.  Exam of the left knee shows pain and crepitus with manipulation of the joint.  Collaterals and cruciates are stable.  No joint effusion.  Assessment and Plan Left knee osteoarthritis Patient is considering left knee replacement and is ready to proceed sooner due to symptom severity. - Schedule left total knee replacement surgery for after Christmas. - Plan for overnight hospital stay post-surgery. - Detailed surgical plan discussed.  Impression is severe left knee degenerative joint disease secondary to Osteoarthritis.  Patient has attempted conservative treatment for at least 6 consecutive weeks within the past 12 weeks, including but not limited to physical therapy, home exercise program, NSAIDs, activity modification, and/or corticosteroid injections. Despite these efforts, symptoms have not improved or have worsened. Conservative measures have been deemed unsuccessful at this time. After a detailed discussion covering diagnosis and treatment  options--including the risks, benefits, alternatives, and potential complications of surgical and nonsurgical management--the patient elected to proceed with surgery  Anticoagulants: No antithrombotic Postop anticoagulation: Eliquis  Diabetic: Yes  Nickel allergy: No Prior DVT/PE: No Tobacco use: No Clearances needed for surgery: None Anticipated discharge dispo: Home   Status post right total knee replacement Approximately three months post-surgery with satisfactory recovery. Continued work on swelling and range of motion. - Continue exercises to improve range of motion and reduce swelling in the right knee.  Diabetes management Good diabetes control is crucial for health and surgical outcomes. - Ensure diabetes remains well-controlled.  Follow-Up Instructions: No follow-ups on file.   Orders:  Orders Placed This Encounter  Procedures   XR KNEE 3 VIEW LEFT   No orders of the defined types were placed in this encounter.     Procedures: No procedures performed   Clinical Data: No additional findings.   Subjective: Chief Complaint  Patient presents with   Left Knee - Pain    HPI  Review of Systems  Constitutional: Negative.   HENT: Negative.    Eyes: Negative.   Respiratory: Negative.    Cardiovascular: Negative.   Endocrine: Negative.   Musculoskeletal: Negative.   Neurological: Negative.   Hematological: Negative.   Psychiatric/Behavioral: Negative.    All other systems reviewed and are negative.    Objective: Vital Signs: Ht 5' 3.5 (1.613 m)   Wt 194 lb (88 kg)   BMI 33.83 kg/m   Physical Exam Vitals and nursing note reviewed.  Constitutional:      Appearance:  She is well-developed.  HENT:     Head: Normocephalic and atraumatic.  Pulmonary:     Effort: Pulmonary effort is normal.  Abdominal:     Palpations: Abdomen is soft.  Musculoskeletal:     Cervical back: Neck supple.  Skin:    General: Skin is warm.     Capillary Refill:  Capillary refill takes less than 2 seconds.  Neurological:     Mental Status: She is alert and oriented to person, place, and time.  Psychiatric:        Behavior: Behavior normal.        Thought Content: Thought content normal.        Judgment: Judgment normal.     Ortho Exam  Specialty Comments:  No specialty comments available.  Imaging: XR KNEE 3 VIEW LEFT Result Date: 02/22/2024 X-rays of the left knee show advanced tricompartmental osteoarthritis.  Bone-on-bone joint space narrowing.  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, with long-term current use of insulin  (HCC) 04/25/2022   Amblyopia of eye, right 07/27/2021   Corneal guttata of left eye 07/27/2021   Influenza vaccine refused 07/29/2020   COVID-19 vaccine dose declined 07/29/2020   Vasomotor symptoms due to menopause 04/24/2020   Atrophic vaginitis 04/24/2020   Impingement syndrome of left shoulder 01/23/2018   Past Medical History:  Diagnosis Date   Ankle fracture 2016   Right   Aortic atherosclerosis    trace calcific atherosclerosis aortic arch per ct neck done 12-22-17   Bronchitis    Diabetes mellitus without complication (HCC)    Hypertension    OA (osteoarthritis) of shoulder    left shoulder, both knees arthritis   Osteoarthritis, knee    Sleep apnea    wears CPAP    Family History  Problem Relation Age of Onset   Breast cancer Mother    Colon cancer Father 72   Diabetes Son    Esophageal cancer Neg Hx    Stomach cancer Neg Hx    Liver disease Neg Hx    Pancreatic cancer Neg Hx    Rectal cancer Neg Hx     Past Surgical  History:  Procedure Laterality Date   CHOLECYSTECTOMY N/A 04/15/2023   Procedure: LAPAROSCOPIC CHOLECYSTECTOMY WITH ICG DYE;  Surgeon: Ebbie Cough, MD;  Location: [facility];  Service: General;  Laterality: N/A;   COLONOSCOPY     TOTAL KNEE ARTHROPLASTY Right 11/20/2023   Procedure: ARTHROPLASTY, KNEE, TOTAL RIGHT;  Surgeon: Jerri Kay HERO, MD;  Location: [facility];  Service: Orthopedics;  Laterality: Right;   Social History   Occupational History   Not on file  Tobacco Use   Smoking status: Former    Current packs/day: 0.00    Types: Cigarettes    Quit date: 11/28/2019    Years since quitting: 4.2   Smokeless tobacco: Never  Vaping Use   Vaping status: Never Used  Substance and Sexual Activity   Alcohol use: Not Currently   Drug use: No   Sexual activity: Not Currently    Birth control/protection: Condom

## 2024-02-26 ENCOUNTER — Other Ambulatory Visit: Payer: Self-pay | Admitting: Nurse Practitioner

## 2024-02-26 DIAGNOSIS — E1165 Type 2 diabetes mellitus with hyperglycemia: Secondary | ICD-10-CM

## 2024-02-26 DIAGNOSIS — E785 Hyperlipidemia, unspecified: Secondary | ICD-10-CM

## 2024-02-26 NOTE — Telephone Encounter (Unsigned)
 Copied from CRM #[id number]. Topic: Clinical - Medication Refill >> Feb 26, 2024 11:49 AM Zebedee SAUNDERS wrote: Medication: atorvastatin  (LIPITOR) 80 MG tablet  Has the patient contacted their pharmacy? Yes (Agent: If no, request that the patient contact the pharmacy for the refill. If patient does not wish to contact the pharmacy document the reason why and proceed with request.) (Agent: If yes, when and what did the pharmacy advise?)  This is the patient's preferred pharmacy:  [id number] - [place], [place] - [address] AT [facility] [address] [place]  [address] Phone: [phone] Fax: [phone]  Is this the correct pharmacy for this prescription? Yes If no, delete pharmacy and type the correct one.   Has the prescription been filled recently? Yes  Is the patient out of the medication? Yes  Has the patient been seen for an appointment in the last year OR does the patient have an upcoming appointment? Yes  Can we respond through MyChart? Yes  Agent: Please be advised that Rx refills may take up to 3 business days. We ask that you follow-up with your pharmacy.

## 2024-02-27 MED ORDER — ATORVASTATIN CALCIUM 80 MG PO TABS: 80.0000 mg | ORAL_TABLET | Freq: Every day | ORAL | 1 refills | Status: AC

## 2024-03-12 ENCOUNTER — Ambulatory Visit: Payer: 59

## 2024-03-12 ENCOUNTER — Telehealth: Payer: Self-pay | Admitting: Nurse Practitioner

## 2024-03-12 VITALS — BP 107/65 | Ht 63.5 in | Wt 195.0 lb

## 2024-03-12 DIAGNOSIS — Z0000 Encounter for general adult medical examination without abnormal findings: Secondary | ICD-10-CM

## 2024-03-12 NOTE — Progress Notes (Signed)
 Because this visit was a virtual/telehealth visit,  certain criteria was not obtained, such a blood pressure, CBG if applicable, and timed get up and go. Any medications not marked as taking were not mentioned during the medication reconciliation part of the visit. Any vitals not documented were not able to be obtained due to this being a telehealth visit or patient was unable to self-report a recent blood pressure reading due to a lack of equipment at home via telehealth. Vitals that have been documented are verbally provided by the patient.   This visit was performed by a medical professional under my direct supervision. I was immediately available for consultation/collaboration. I have reviewed and agree with the Annual Wellness Visit documentation.  Subjective:   Ann Foster is a 58 y.o. who presents for a Medicare Wellness preventive visit.  As a reminder, Annual Wellness Visits don't include a physical exam, and some assessments may be limited, especially if this visit is performed virtually. We may recommend an in-person follow-up visit with your provider if needed.  Visit Complete: Virtual I connected with  Ann Foster on 03/12/24 by a audio enabled telemedicine application and verified that I am speaking with the correct person using two identifiers.  Patient Location: Home  Provider Location: Home Office  I discussed the limitations of evaluation and management by telemedicine. The patient expressed understanding and agreed to proceed.  Vital Signs: Because this visit was a virtual/telehealth visit, some criteria may be missing or patient reported. Any vitals not documented were not able to be obtained and vitals that have been documented are patient reported.  VideoDeclined- This patient declined Librarian, academic. Therefore the visit was completed with audio only.  Persons Participating in Visit: Patient.  AWV Questionnaire: Yes: Patient  Medicare AWV questionnaire was completed by the patient on 03/11/2024; I have confirmed that all information answered by patient is correct and no changes since this date.  Cardiac Risk Factors include: advanced age (>1men, >40 women);obesity (BMI >30kg/m2);smoking/ tobacco exposure;diabetes mellitus     Objective:    Today's Vitals   03/12/24 1417  BP: 107/65  Weight: 195 lb (88.5 kg)  Height: 5' 3.5 (1.613 m)   Body mass index is 34 kg/m.     03/12/2024    2:17 PM 12/06/2023   10:53 AM 11/09/2023    9:41 AM 06/07/2023    9:58 PM 04/14/2023    1:00 PM 04/05/2023   10:07 PM 03/01/2023    3:54 PM  Advanced Directives  Does Patient Have a Medical Advance Directive? Yes No No No No No No  Type of Estate agent of [place];Living will        Does patient want to make changes to medical advance directive? No - Patient declined        Copy of Healthcare Power of Attorney in Chart? No - copy requested        Would patient like information on creating a medical advance directive?  No - Patient declined No - Patient declined Yes (MAU/Ambulatory/Procedural Areas - Information given) No - Patient declined No - Patient declined Yes (MAU/Ambulatory/Procedural Areas - Information given)    Current Medications (verified) Outpatient Encounter Medications as of 03/12/2024  Medication Sig   Accu-Chek Softclix Lancets lancets Check blood glucose level by fingerstick once per day. E11.65   albuterol  (VENTOLIN  HFA) 108 (90 Base) MCG/ACT inhaler Inhale 2 puffs into the lungs every 6 (six) hours as needed for wheezing or shortness  of breath.   atorvastatin  (LIPITOR) 80 MG tablet Take 1 tablet (80 mg total) by mouth daily.   Blood Glucose Monitoring Suppl (ACCU-CHEK GUIDE) w/Device KIT Check blood glucose level by fingerstick once per day. E11.65   buPROPion  (WELLBUTRIN  SR) 150 MG 12 hr tablet TAKE 1 TABLET(150 MG) BY MOUTH TWICE DAILY   busPIRone  (BUSPAR ) 10 MG tablet TAKE 1  TABLET(10 MG) BY MOUTH TWICE DAILY   dapagliflozin  propanediol (FARXIGA ) 5 MG TABS tablet Take 1 tablet (5 mg total) by mouth daily before breakfast.   docusate sodium  (COLACE) 100 MG capsule Take 1 capsule (100 mg total) by mouth daily as needed.   estradiol  (ESTRACE ) 0.1 MG/GM vaginal cream Apply 1 gram per vagina every night for 2 weeks, then apply three times a week   glucose blood (ACCU-CHEK GUIDE) test strip Check blood glucose level by fingerstick once per day. E11.65   Insulin  Pen Needle (B-D UF III MINI PEN NEEDLES) 31G X 5 MM MISC Use as instructed. Inject into the skin once weekly   methocarbamol  (ROBAXIN ) 750 MG tablet Take 1 tablet (750 mg total) by mouth 3 (three) times daily as needed.   methocarbamol  (ROBAXIN ) 750 MG tablet Take 1 tablet (750 mg total) by mouth 3 (three) times daily as needed.   methylPREDNISolone  (MEDROL  DOSEPAK) 4 MG TBPK tablet Take as directed on package   montelukast  (SINGULAIR ) 10 MG tablet Take 1 tablet (10 mg total) by mouth at bedtime.   omeprazole  (PRILOSEC) 40 MG capsule Take 1 capsule (40 mg total) by mouth daily.   ondansetron  (ZOFRAN ) 4 MG tablet Take 1 tablet (4 mg total) by mouth every 8 (eight) hours as needed for nausea or vomiting.   oxyCODONE -acetaminophen  (PERCOCET) 5-325 MG tablet Take 1-2 tablets by mouth 3 (three) times daily as needed.   Prasterone  (INTRAROSA ) 6.5 MG INST Insert one capsule vaginally   ramipril  (ALTACE ) 2.5 MG capsule Take 1 capsule (2.5 mg total) by mouth daily.   TRULICITY  3 MG/0.5ML SOAJ ADMINISTER 3 MG UNDER THE SKIN 1 TIME A WEEK AS DIRECTED   apixaban  (ELIQUIS ) 2.5 MG TABS tablet Take one tablet by mouth twice daily for 30 days after surgery to prevent blood clots   oxyCODONE -acetaminophen  (PERCOCET) 5-325 MG tablet Take 1-2 tablets by mouth every 6 (six) hours as needed. To be taken after surgery   [DISCONTINUED] lisinopril  (ZESTRIL ) 2.5 MG tablet Take 1 tablet (2.5 mg total) by mouth 2 (two) times daily.   No  facility-administered encounter medications on file as of 03/12/2024.    Allergies (verified) Patient has no known allergies.   History: Past Medical History:  Diagnosis Date   Ankle fracture 2016   Right   Aortic atherosclerosis    trace calcific atherosclerosis aortic arch per ct neck done 12-22-17   Bronchitis    Diabetes mellitus without complication (HCC)    Hypertension    OA (osteoarthritis) of shoulder    left shoulder, both knees arthritis   Osteoarthritis, knee    Sleep apnea    wears CPAP   Past Surgical History:  Procedure Laterality Date   CHOLECYSTECTOMY N/A 04/15/2023   Procedure: LAPAROSCOPIC CHOLECYSTECTOMY WITH ICG DYE;  Surgeon: Ebbie Cough, MD;  Location: [facility];  Service: General;  Laterality: N/A;   COLONOSCOPY     TOTAL KNEE ARTHROPLASTY Right 11/20/2023   Procedure: ARTHROPLASTY, KNEE, TOTAL RIGHT;  Surgeon: Jerri Kay HERO, MD;  Location: [facility];  Service: Orthopedics;  Laterality: Right;   Family History  Problem  Relation Age of Onset   Breast cancer Mother    Colon cancer Father 78   Diabetes Son    Esophageal cancer Neg Hx    Stomach cancer Neg Hx    Liver disease Neg Hx    Pancreatic cancer Neg Hx    Rectal cancer Neg Hx    Social History   Socioeconomic History   Marital status: Single    Spouse name: Not on file   Number of children: 3   Years of education: Not on file   Highest education level: 12th grade  Occupational History   Not on file  Tobacco Use   Smoking status: Former    Current packs/day: 0.00    Types: Cigarettes    Quit date: 11/28/2019    Years since quitting: 4.2   Smokeless tobacco: Never  Vaping Use   Vaping status: Never Used  Substance and Sexual Activity   Alcohol use: Not Currently   Drug use: No   Sexual activity: Not Currently    Birth control/protection: Condom  Other Topics Concern   Not on file  Social History Narrative   Left Handed   One story home    Social Drivers of Health    Financial Resource Strain: Low Risk  (03/11/2024)   Overall Financial Resource Strain (CARDIA)    Difficulty of Paying Living Expenses: Not hard at all  Food Insecurity: No Food Insecurity (03/11/2024)   Hunger Vital Sign    Worried About Running Out of Food in the Last Year: Never true    Ran Out of Food in the Last Year: Never true  Transportation Needs: No Transportation Needs (03/11/2024)   PRAPARE - Administrator, Civil Service (Medical): No    Lack of Transportation (Non-Medical): No  Physical Activity: Sufficiently Active (03/11/2024)   Exercise Vital Sign    Days of Exercise per Week: 5 days    Minutes of Exercise per Session: 30 min  Stress: No Stress Concern Present (03/11/2024)   [employer] of Occupational Health - Occupational Stress Questionnaire    Feeling of Stress: Not at all  Social Connections: Moderately Isolated (03/11/2024)   Social Connection and Isolation Panel    Frequency of Communication with Friends and Family: More than three times a week    Frequency of Social Gatherings with Friends and Family: More than three times a week    Attends Religious Services: 1 to 4 times per year    Active Member of [employer] or Organizations: No    Attends Engineer, structural: Not on file    Marital Status: Never married    Tobacco Counseling Counseling given: Not Answered    Clinical Intake:  Pre-visit preparation completed: Yes  Pain : No/denies pain     BMI - recorded: 34 Nutritional Status: BMI > 30  Obese Nutritional Risks: None Diabetes: No  Lab Results  Component Value Date   HGBA1C 7.4 (A) 12/18/2023   HGBA1C 7.0 (H) 11/09/2023   HGBA1C 7.0 (A) 09/18/2023     How often do you need to have someone help you when you read instructions, pamphlets, or other written materials from your doctor or pharmacy?: 1 - Never  Interpreter Needed?: No  Information entered by :: Samaria Anes,CMA   Activities of Daily Living      03/12/2024   10:07 AM 11/09/2023    9:45 AM  In your present state of health, do you have any difficulty performing the following activities:  Hearing? 0   Vision? 0   Difficulty concentrating or making decisions? 0   Walking or climbing stairs? 0   Dressing or bathing? 0   Doing errands, shopping? 0 0  Preparing Food and eating ? N   Using the Toilet? N   In the past six months, have you accidently leaked urine? N   Do you have problems with loss of bowel control? N   Managing your Medications? N   Managing your Finances? N   Housekeeping or managing your Housekeeping? N     Patient Care Team: Theotis Haze ORN, NP as PCP - General (Nurse Practitioner) Tobie Tonita POUR, DO as Consulting Physician (Neurology)  I have updated your Care Teams any recent Medical Services you may have received from other providers in the past year.     Assessment:   This is a routine wellness examination for Ann Foster.  Hearing/Vision screen Hearing Screening - Comments:: No difficulties  Vision Screening - Comments:: Patient wears glasses    Goals Addressed             This Visit's Progress    Remain active and independent   On track      Depression Screen     03/12/2024    2:20 PM 12/18/2023    8:59 AM 06/19/2023   10:19 AM 03/01/2023    3:51 PM 11/02/2022    9:54 AM 06/03/2022   10:26 AM 03/02/2022   11:01 AM  PHQ 2/9 Scores  PHQ - 2 Score 0 0 0 0 0 0 0  PHQ- 9 Score 0 0 0  0 0 0    Fall Risk     03/12/2024   10:07 AM 12/18/2023    8:59 AM 09/18/2023   10:28 AM 04/05/2023    3:17 PM 03/01/2023    3:53 PM  Fall Risk   Falls in the past year? 0 0 1 0 0  Number falls in past yr: 0 0 0 0 0  Injury with Fall? 0 0 1 0 0  Risk for fall due to : No Fall Risks No Fall Risks No Fall Risks No Fall Risks Impaired mobility  Follow up Falls evaluation completed Falls evaluation completed Falls evaluation completed  Falls prevention discussed;Education provided;Falls evaluation completed     MEDICARE RISK AT HOME:  Medicare Risk at Home Any stairs in or around the home?: (Patient-Rptd) No If so, are there any without handrails?: (Patient-Rptd) No Home free of loose throw rugs in walkways, pet beds, electrical cords, etc?: (Patient-Rptd) No Adequate lighting in your home to reduce risk of falls?: (Patient-Rptd) Yes Life alert?: (Patient-Rptd) No Use of a cane, walker or w/c?: (Patient-Rptd) No Grab bars in the bathroom?: (Patient-Rptd) No Shower chair or bench in shower?: (Patient-Rptd) No Elevated toilet seat or a handicapped toilet?: (Patient-Rptd) No  TIMED UP AND GO:  Was the test performed?  No  Cognitive Function: 6CIT completed        03/12/2024    2:20 PM 03/01/2023    3:53 PM  6CIT Screen  What Year? 0 points 0 points  What month? 0 points 0 points  What time? 0 points 0 points  Count back from 20 0 points 0 points  Months in reverse 0 points 0 points  Repeat phrase 0 points 0 points  Total Score 0 points 0 points    Immunizations Immunization History  Administered Date(s) Administered   PNEUMOCOCCAL CONJUGATE-20 12/18/2023   Pneumococcal Polysaccharide-23 05/22/2019   Tdap  05/22/2019    Screening Tests Health Maintenance  Topic Date Due   COVID-19 Vaccine (1) Never done   FOOT EXAM  Never done   Zoster Vaccines- Shingrix (1 of 2) Never done   Diabetic kidney evaluation - Urine ACR  03/03/2023   Colonoscopy  03/31/2024   OPHTHALMOLOGY EXAM  03/21/2024   HEMOGLOBIN A1C  06/19/2024   Cervical Cancer Screening (HPV/Pap Cotest)  07/28/2024   Diabetic kidney evaluation - eGFR measurement  11/08/2024   Medicare Annual Wellness (AWV)  03/12/2025   Mammogram  01/07/2026   DTaP/Tdap/Td (2 - Td or Tdap) 05/21/2029   Pneumococcal Vaccine: 50+ Years  Completed   Hepatitis C Screening  Completed   HIV Screening  Completed   Hepatitis B Vaccines 19-59 Average Risk  Aged Out   HPV VACCINES  Aged Out   Meningococcal B Vaccine  Aged Out    Influenza Vaccine  Discontinued   COLON CANCER SCREENING ANNUAL FOBT  Discontinued    Health Maintenance Items Addressed:patient declined   Additional Screening:  Vision Screening: Recommended annual ophthalmology exams for early detection of glaucoma and other disorders of the eye. Is the patient up to date with their annual eye exam?  No  Who is the provider or what is the name of the office in which the patient attends annual eye exams?   Dental Screening: Recommended annual dental exams for proper oral hygiene  Community Resource Referral / Chronic Care Management: CRR required this visit?  No   CCM required this visit?  No   Plan:    I have personally reviewed and noted the following in the patient's chart:   Medical and social history Use of alcohol, tobacco or illicit drugs  Current medications and supplements including opioid prescriptions. Patient is not currently taking opioid prescriptions. Functional ability and status Nutritional status Physical activity Advanced directives List of other physicians Hospitalizations, surgeries, and ER visits in previous 12 months Vitals Screenings to include cognitive, depression, and falls Referrals and appointments  In addition, I have reviewed and discussed with patient certain preventive protocols, quality metrics, and best practice recommendations. A written personalized care plan for preventive services as well as general preventive health recommendations were provided to patient.   Ann Foster Right, [place]   03/12/2024   After Visit Summary: (MyChart) Due to this being a telephonic visit, the after visit summary with patients personalized plan was offered to patient via MyChart   Notes: Please refer to Routing Comments.

## 2024-03-12 NOTE — Patient Instructions (Signed)
 Ms. Laden,  Thank you for taking the time for your Medicare Wellness Visit. I appreciate your continued commitment to your health goals. Please review the care plan we discussed, and feel free to reach out if I can assist you further.  Medicare recommends these wellness visits once per year to help you and your care team stay ahead of potential health issues. These visits are designed to focus on prevention, allowing your provider to concentrate on managing your acute and chronic conditions during your regular appointments.  Please note that Annual Wellness Visits do not include a physical exam. Some assessments may be limited, especially if the visit was conducted virtually. If needed, we may recommend a separate in-person follow-up with your provider.  Ongoing Care Seeing your primary care provider every 3 to 6 months helps us  monitor your health and provide consistent, personalized care.   Referrals If a referral was made during today's visit and you haven't received any updates within two weeks, please contact the referred provider directly to check on the status.  Recommended Screenings:  Health Maintenance  Topic Date Due   COVID-19 Vaccine (1) Never done   Complete foot exam   Never done   Zoster (Shingles) Vaccine (1 of 2) Never done   Yearly kidney health urinalysis for diabetes  03/03/2023   Colon Cancer Screening  03/31/2024   Eye exam for diabetics  03/21/2024   Hemoglobin A1C  06/19/2024   Pap with HPV screening  07/28/2024   Yearly kidney function blood test for diabetes  11/08/2024   Medicare Annual Wellness Visit  03/12/2025   Breast Cancer Screening  01/07/2026   DTaP/Tdap/Td vaccine (2 - Td or Tdap) 05/21/2029   Pneumococcal Vaccine for age over 51  Completed   Hepatitis C Screening  Completed   HIV Screening  Completed   Hepatitis B Vaccine  Aged Out   HPV Vaccine  Aged Out   Meningitis B Vaccine  Aged Out   Flu Shot  Discontinued   Stool Blood Test   Discontinued       03/12/2024    2:17 PM  Advanced Directives  Does Patient Have a Medical Advance Directive? Yes  Type of Estate agent of [place];Living will  Does patient want to make changes to medical advance directive? No - Patient declined  Copy of Healthcare Power of Attorney in Chart? No - copy requested   Advance Care Planning is important because it: Ensures you receive medical care that aligns with your values, goals, and preferences. Provides guidance to your family and loved ones, reducing the emotional burden of decision-making during critical moments.  Vision: Annual vision screenings are recommended for early detection of glaucoma, cataracts, and diabetic retinopathy. These exams can also reveal signs of chronic conditions such as diabetes and high blood pressure.  Dental: Annual dental screenings help detect early signs of oral cancer, gum disease, and other conditions linked to overall health, including heart disease and diabetes.  Please see the attached documents for additional preventive care recommendations.

## 2024-03-12 NOTE — Telephone Encounter (Signed)
 Noted

## 2024-03-12 NOTE — Telephone Encounter (Signed)
 Patient came in the office today for lab work, however, the orders were not in. Please advise the patient once the A1c order has been placed.

## 2024-03-12 NOTE — Telephone Encounter (Signed)
 Noted, Called patient. Patient is scheduled for a nurse visit 10/22 for urine culture.

## 2024-03-20 ENCOUNTER — Ambulatory Visit

## 2024-03-29 ENCOUNTER — Ambulatory Visit: Admitting: Nurse Practitioner

## 2024-04-01 ENCOUNTER — Encounter: Payer: Self-pay | Admitting: Radiology

## 2024-04-02 ENCOUNTER — Encounter: Payer: Self-pay | Admitting: Nurse Practitioner

## 2024-04-02 ENCOUNTER — Ambulatory Visit: Attending: Nurse Practitioner | Admitting: Nurse Practitioner

## 2024-04-02 VITALS — BP 138/84 | HR 85 | Resp 19 | Ht 63.5 in | Wt 194.0 lb

## 2024-04-02 DIAGNOSIS — Z7901 Long term (current) use of anticoagulants: Secondary | ICD-10-CM

## 2024-04-02 DIAGNOSIS — L918 Other hypertrophic disorders of the skin: Secondary | ICD-10-CM

## 2024-04-02 DIAGNOSIS — E1165 Type 2 diabetes mellitus with hyperglycemia: Secondary | ICD-10-CM

## 2024-04-02 DIAGNOSIS — D229 Melanocytic nevi, unspecified: Secondary | ICD-10-CM

## 2024-04-02 DIAGNOSIS — Z7985 Long-term (current) use of injectable non-insulin antidiabetic drugs: Secondary | ICD-10-CM

## 2024-04-02 LAB — POCT GLYCOSYLATED HEMOGLOBIN (HGB A1C): HbA1c, POC (controlled diabetic range): 6.8 % (ref 0.0–7.0)

## 2024-04-02 NOTE — Patient Instructions (Signed)
 [facility]  [address] [place], [place] [address] PH# [phone]

## 2024-04-02 NOTE — Progress Notes (Signed)
 Assessment & Plan:  Ann Foster was seen today for diabetes.  Diagnoses and all orders for this visit:  Type 2 diabetes mellitus with hyperglycemia, unspecified whether long term insulin  use (HCC) -     POCT glycosylated hemoglobin (Hb A1C) -     CMP14+EGFR A1c at 6.8 indicates good glycemic control. Weight loss supports diabetes management. - Continue current diabetes management plan.   Multiple skin tags -     Ambulatory referral to Dermatology  Atypical mole -     Ambulatory referral to Dermatology Hypertrophic scalp lesion present for 3-4 years, causing irritation and itching. - Referred to dermatologist for evaluation. - Documented lesion with photograph.   Patient has been counseled on age-appropriate routine health concerns for screening and prevention. These are reviewed and up-to-date. Referrals have been placed accordingly. Immunizations are up-to-date or declined.    Subjective:   Chief Complaint  Patient presents with   Diabetes    Ann Foster 58 y.o. female presents to office today for follow up to DM and with concerns of atypical mole  DM 2 Her A1c is currently 6.8, the lowest it has been since 2020.  Her weight is down although her ability to walk has been limited due to knee surgery. Lab Results  Component Value Date   HGBA1C 6.8 04/02/2024    She underwent knee surgery six months ago, which has limited her physical activity. She is cautious about walking on uneven trails to avoid stressing her knees. Despite this, she continues to walk in her driveway and occasionally to the store. She is scheduled for another knee surgery on December 1st.   She has had a mole on her head for approximately four years, which is associated with constant itching. The mole is not sore but becomes tender when combing her hair. The size of the mole has remained consistent over time.  She also has several small skin tags under the left eye which she would like  removed.    Review of Systems  Constitutional:  Negative for fever, malaise/fatigue and weight loss.  HENT: Negative.  Negative for nosebleeds.   Eyes: Negative.  Negative for blurred vision, double vision and photophobia.  Respiratory: Negative.  Negative for cough and shortness of breath.   Cardiovascular: Negative.  Negative for chest pain, palpitations and leg swelling.  Gastrointestinal: Negative.  Negative for heartburn, nausea and vomiting.  Musculoskeletal: Negative.  Negative for myalgias.  Skin:        SEE HPI  Neurological: Negative.  Negative for dizziness, focal weakness, seizures and headaches.  Psychiatric/Behavioral: Negative.  Negative for suicidal ideas.     Past Medical History:  Diagnosis Date   Ankle fracture 2016   Right   Aortic atherosclerosis    trace calcific atherosclerosis aortic arch per ct neck done 12-22-17   Bronchitis    Diabetes mellitus without complication (HCC)    Hypertension    OA (osteoarthritis) of shoulder    left shoulder, both knees arthritis   Osteoarthritis, knee    Sleep apnea    wears CPAP    Past Surgical History:  Procedure Laterality Date   CHOLECYSTECTOMY N/A 04/15/2023   Procedure: LAPAROSCOPIC CHOLECYSTECTOMY WITH ICG DYE;  Surgeon: Ebbie Cough, MD;  Location: [facility];  Service: General;  Laterality: N/A;   COLONOSCOPY     TOTAL KNEE ARTHROPLASTY Right 11/20/2023   Procedure: ARTHROPLASTY, KNEE, TOTAL RIGHT;  Surgeon: Jerri Kay HERO, MD;  Location: [facility] OR;  Service: Orthopedics;  Laterality: Right;  Family History  Problem Relation Age of Onset   Breast cancer Mother    Colon cancer Father 29   Diabetes Son    Esophageal cancer Neg Hx    Stomach cancer Neg Hx    Liver disease Neg Hx    Pancreatic cancer Neg Hx    Rectal cancer Neg Hx     Social History Reviewed with no changes to be made today.   Outpatient Medications Prior to Visit  Medication Sig Dispense Refill   Accu-Chek Softclix Lancets lancets  Check blood glucose level by fingerstick once per day. E11.65 100 each 2   albuterol  (VENTOLIN  HFA) 108 (90 Base) MCG/ACT inhaler Inhale 2 puffs into the lungs every 6 (six) hours as needed for wheezing or shortness of breath. 18 g 2   atorvastatin  (LIPITOR) 80 MG tablet Take 1 tablet (80 mg total) by mouth daily. 90 tablet 1   Blood Glucose Monitoring Suppl (ACCU-CHEK GUIDE) w/Device KIT Check blood glucose level by fingerstick once per day. E11.65 1 kit 0   buPROPion  (WELLBUTRIN  SR) 150 MG 12 hr tablet TAKE 1 TABLET(150 MG) BY MOUTH TWICE DAILY 60 tablet 1   busPIRone  (BUSPAR ) 10 MG tablet TAKE 1 TABLET(10 MG) BY MOUTH TWICE DAILY 60 tablet 1   dapagliflozin  propanediol (FARXIGA ) 5 MG TABS tablet Take 1 tablet (5 mg total) by mouth daily before breakfast. 100 tablet 2   docusate sodium  (COLACE) 100 MG capsule Take 1 capsule (100 mg total) by mouth daily as needed. 30 capsule 2   estradiol  (ESTRACE ) 0.1 MG/GM vaginal cream Apply 1 gram per vagina every night for 2 weeks, then apply three times a week 42.5 g 12   glucose blood (ACCU-CHEK GUIDE) test strip Check blood glucose level by fingerstick once per day. E11.65 100 each 2   Insulin  Pen Needle (B-D UF III MINI PEN NEEDLES) 31G X 5 MM MISC Use as instructed. Inject into the skin once weekly 100 each 1   methocarbamol  (ROBAXIN ) 750 MG tablet Take 1 tablet (750 mg total) by mouth 3 (three) times daily as needed. 30 tablet 2   methocarbamol  (ROBAXIN ) 750 MG tablet Take 1 tablet (750 mg total) by mouth 3 (three) times daily as needed. 30 tablet 2   methylPREDNISolone  (MEDROL  DOSEPAK) 4 MG TBPK tablet Take as directed on package 21 tablet 0   montelukast  (SINGULAIR ) 10 MG tablet Take 1 tablet (10 mg total) by mouth at bedtime. 30 tablet 3   omeprazole  (PRILOSEC) 40 MG capsule Take 1 capsule (40 mg total) by mouth daily. 90 capsule 3   ondansetron  (ZOFRAN ) 4 MG tablet Take 1 tablet (4 mg total) by mouth every 8 (eight) hours as needed for nausea or  vomiting. 40 tablet 0   Prasterone  (INTRAROSA ) 6.5 MG INST Insert one capsule vaginally 28 each 3   ramipril  (ALTACE ) 2.5 MG capsule Take 1 capsule (2.5 mg total) by mouth daily. 90 capsule 1   TRULICITY  3 MG/0.5ML SOAJ ADMINISTER 3 MG UNDER THE SKIN 1 TIME A WEEK AS DIRECTED 6 mL 0   apixaban  (ELIQUIS ) 2.5 MG TABS tablet Take one tablet by mouth twice daily for 30 days after surgery to prevent blood clots 60 tablet 0   oxyCODONE -acetaminophen  (PERCOCET) 5-325 MG tablet Take 1-2 tablets by mouth every 6 (six) hours as needed. To be taken after surgery 40 tablet 0   oxyCODONE -acetaminophen  (PERCOCET) 5-325 MG tablet Take 1-2 tablets by mouth 3 (three) times daily as needed. (Patient not taking: Reported  on 04/02/2024) 40 tablet 0   No facility-administered medications prior to visit.    No Known Allergies     Objective:    BP 138/84 (BP Location: Left Arm, Patient Position: Sitting, Cuff Size: Large)   Pulse 85   Resp 19   Ht 5' 3.5 (1.613 m)   Wt 194 lb (88 kg)   SpO2 99%   BMI 33.83 kg/m  Wt Readings from Last 3 Encounters:  04/02/24 194 lb (88 kg)  03/12/24 195 lb (88.5 kg)  02/22/24 194 lb (88 kg)    Physical Exam Vitals and nursing note reviewed.  Constitutional:      Appearance: She is well-developed.  HENT:     Head: Normocephalic and atraumatic.  Cardiovascular:     Rate and Rhythm: Normal rate and regular rhythm.     Heart sounds: Normal heart sounds. No murmur heard.    No friction rub. No gallop.  Pulmonary:     Effort: Pulmonary effort is normal. No tachypnea or respiratory distress.     Breath sounds: Normal breath sounds. No decreased breath sounds, wheezing, rhonchi or rales.  Chest:     Chest wall: No tenderness.  Musculoskeletal:        General: Normal range of motion.     Cervical back: Normal range of motion.  Skin:    General: Skin is warm and dry.     Comments: SEE PHOTO  Neurological:     Mental Status: She is alert and oriented to person,  place, and time.     Coordination: Coordination normal.  Psychiatric:        Behavior: Behavior normal. Behavior is cooperative.        Thought Content: Thought content normal.        Judgment: Judgment normal.          Patient has been counseled extensively about nutrition and exercise as well as the importance of adherence with medications and regular follow-up. The patient was given clear instructions to go to ER or return to medical center if symptoms don't improve, worsen or new problems develop. The patient verbalized understanding.   Follow-up: Return in about 3 months (around 07/08/2024).   Haze LELON Servant, FNP-BC [facility] [place], [place] [phone]   04/02/2024, 1:55 PM

## 2024-04-03 ENCOUNTER — Ambulatory Visit: Payer: Self-pay | Admitting: Nurse Practitioner

## 2024-04-03 LAB — CMP14+EGFR
ALT: 24 IU/L (ref 0–32)
AST: 13 IU/L (ref 0–40)
Albumin: 4.5 g/dL (ref 3.8–4.9)
Alkaline Phosphatase: 101 IU/L (ref 49–135)
BUN/Creatinine Ratio: 16 (ref 12–28)
BUN: 13 mg/dL (ref 8–27)
Bilirubin Total: 0.6 mg/dL (ref 0.0–1.2)
CO2: 22 mmol/L (ref 20–29)
Calcium: 10 mg/dL (ref 8.7–10.3)
Chloride: 105 mmol/L (ref 96–106)
Creatinine, Ser: 0.81 mg/dL (ref 0.57–1.00)
Globulin, Total: 3.3 g/dL (ref 1.5–4.5)
Glucose: 103 mg/dL — ABNORMAL HIGH (ref 70–99)
Potassium: 4.2 mmol/L (ref 3.5–5.2)
Sodium: 142 mmol/L (ref 134–144)
Total Protein: 7.8 g/dL (ref 6.0–8.5)
eGFR: 83 mL/min/1.73 (ref >59)

## 2024-04-10 ENCOUNTER — Telehealth: Payer: Self-pay | Admitting: Nurse Practitioner

## 2024-04-10 NOTE — Telephone Encounter (Signed)
 Copied from CRM [id number]. Topic: Referral - Status >> Apr 10, 2024  9:40 AM Gustabo D wrote:  Pt was told to call if she hasn't heard from dermatologist

## 2024-04-10 NOTE — Telephone Encounter (Signed)
 Please advise, the referral status is showing as currently pending.

## 2024-04-12 ENCOUNTER — Telehealth: Payer: Self-pay | Admitting: Nurse Practitioner

## 2024-04-12 NOTE — Telephone Encounter (Signed)
 Copied from CRM #[id number]. Topic: Clinical - Request for Lab/Test Order >> Apr 12, 2024  2:24 PM Willma R wrote:  Reason for CRM: Patient states she needs an order to have her annual sleep study test done.  Patient can be reached at [phone]

## 2024-04-12 NOTE — Telephone Encounter (Signed)
 Patient identified by name and date of birth.   Patient aware of response and voiced understanding.

## 2024-04-15 ENCOUNTER — Other Ambulatory Visit: Payer: Self-pay | Admitting: Physician Assistant

## 2024-04-15 MED ORDER — DOCUSATE SODIUM 100 MG PO CAPS: 100.0000 mg | ORAL_CAPSULE | Freq: Every day | ORAL | 2 refills | Status: AC | PRN

## 2024-04-15 MED ORDER — METHOCARBAMOL 750 MG PO TABS
750.0000 mg | ORAL_TABLET | Freq: Three times a day (TID) | ORAL | 2 refills | Status: AC | PRN
Start: 2024-04-15 — End: ?

## 2024-04-15 MED ORDER — DOXYCYCLINE HYCLATE 100 MG PO TABS: 100.0000 mg | ORAL_TABLET | Freq: Two times a day (BID) | ORAL | 0 refills | Status: AC

## 2024-04-15 MED ORDER — OXYCODONE-ACETAMINOPHEN 5-325 MG PO TABS: 1.0000 | ORAL_TABLET | Freq: Four times a day (QID) | ORAL | 0 refills | Status: DC | PRN

## 2024-04-15 MED ORDER — ONDANSETRON HCL 4 MG PO TABS: 4.0000 mg | ORAL_TABLET | Freq: Three times a day (TID) | ORAL | 0 refills | Status: AC | PRN

## 2024-04-15 NOTE — Telephone Encounter (Signed)
 Patient identified by name and date of birth.  Patient has been informed of an annual sleep study not being needed since it was just done earlier this year. Confirmed with Kia at sleep center.

## 2024-04-17 ENCOUNTER — Other Ambulatory Visit: Payer: Self-pay | Admitting: Pharmacist

## 2024-04-17 DIAGNOSIS — E1165 Type 2 diabetes mellitus with hyperglycemia: Secondary | ICD-10-CM

## 2024-04-17 NOTE — Progress Notes (Signed)
 Surgical Instructions   Your procedure is scheduled on April 29, 2024. Report to [facility] Main Entrance A at 6:30 A.M., then check in with the Admitting office. Any questions or running late day of surgery: call [phone]  Questions prior to your surgery date: call [phone], Monday-Friday, 8am-4pm. If you experience any cold or flu symptoms such as cough, fever, chills, shortness of breath, etc. between now and your scheduled surgery, please notify us  at the above number.     Remember:  Do not eat after midnight the night before your surgery  You may drink clear liquids until 5:30 the morning of your surgery.   Clear liquids allowed are: Water, Non-Citrus Juices (without pulp), Carbonated Beverages, Clear Tea (no milk, honey, etc.), Black Coffee Only (NO MILK, CREAM OR POWDERED CREAMER of any kind), and Gatorade.    Take these medicines the morning of surgery with A SIP OF WATER  atorvastatin  (LIPITOR)  buPROPion  (WELLBUTRIN  SR)  omeprazole  (PRILOSEC)  busPIRone  (BUSPAR )   May take these medicines IF NEEDED: albuterol  (VENTOLIN  HFA) inhaler MAY BRING WITH YOU methocarbamol  (ROBAXIN )  ondansetron  (ZOFRAN )  PERCOCET   One week prior to surgery, STOP taking any Aspirin (unless otherwise instructed by your surgeon) Aleve , Naproxen , Ibuprofen , Motrin , Advil , Goody's, BC's, all herbal medications, fish oil, and non-prescription vitamins.          WHAT DO I DO ABOUT MY DIABETES MEDICATION?   Do not take oral diabetes medicines dapagliflozin  propanediol (FARXIGA ) the morning of surgery.  dapagliflozin  propanediol (FARXIGA )   LAST DOSE - 04-26-24 TRULICITY  LAST DOSE - 04-22-24  The day of surgery, do not take other diabetes injectables, including Byetta (exenatide), Bydureon (exenatide ER), Victoza (liraglutide), or Trulicity  (dulaglutide ).  If your CBG is greater than 220 mg/dL, you may take  of your sliding scale (correction) dose of insulin .   HOW TO MANAGE YOUR  DIABETES BEFORE AND AFTER SURGERY  Why is it important to control my blood sugar before and after surgery? Improving blood sugar levels before and after surgery helps healing and can limit problems. A way of improving blood sugar control is eating a healthy diet by:  Eating less sugar and carbohydrates  Increasing activity/exercise  Talking with your doctor about reaching your blood sugar goals High blood sugars (greater than 180 mg/dL) can raise your risk of infections and slow your recovery, so you will need to focus on controlling your diabetes during the weeks before surgery. Make sure that the doctor who takes care of your diabetes knows about your planned surgery including the date and location.  How do I manage my blood sugar before surgery? Check your blood sugar at least 4 times a day, starting 2 days before surgery, to make sure that the level is not too high or low.  Check your blood sugar the morning of your surgery when you wake up and every 2 hours until you get to the Short Stay unit.  If your blood sugar is less than 70 mg/dL, you will need to treat for low blood sugar: Do not take insulin . Treat a low blood sugar (less than 70 mg/dL) with  cup of clear juice (cranberry or apple), 4 glucose tablets, OR glucose gel. Recheck blood sugar in 15 minutes after treatment (to make sure it is greater than 70 mg/dL). If your blood sugar is not greater than 70 mg/dL on recheck, call [phone] for further instructions. Report your blood sugar to the short stay nurse when you get to Short Stay.  If you are admitted to the hospital after surgery: Your blood sugar will be checked by the staff and you will probably be given insulin  after surgery (instead of oral diabetes medicines) to make sure you have good blood sugar levels. The goal for blood sugar control after surgery is 80-180 mg/dL.            Do NOT Smoke (Tobacco/Vaping) for 24 hours prior to your procedure.  If you use a  CPAP at night, you may bring your mask/headgear for your overnight stay.   You will be asked to remove any contacts, glasses, piercing's, hearing aid's, dentures/partials prior to surgery. Please bring cases for these items if needed.    Patients discharged the day of surgery will not be allowed to drive home, and someone needs to stay with them for 24 hours.  SURGICAL WAITING ROOM VISITATION Patients may have no more than 2 support people in the waiting area - these visitors may rotate.   Pre-op nurse will coordinate an appropriate time for 1 ADULT support person, who may not rotate, to accompany patient in pre-op.  Children under the age of 61 must have an adult with them who is not the patient and must remain in the main waiting area with an adult.  If the patient needs to stay at the hospital during part of their recovery, the visitor guidelines for inpatient rooms apply.  Please refer to the [facility] website for the visitor guidelines for any additional information.   If you received a COVID test during your pre-op visit  it is requested that you wear a mask when out in public, stay away from anyone that may not be feeling well and notify your surgeon if you develop symptoms. If you have been in contact with anyone that has tested positive in the last 10 days please notify you surgeon.      Pre-operative 4 CHG Bathing Instructions   You can play a key role in reducing the risk of infection after surgery. Your skin needs to be as free of germs as possible. You can reduce the number of germs on your skin by washing with CHG (chlorhexidine  gluconate) soap before surgery. CHG is an antiseptic soap that kills germs and continues to kill germs even after washing.   DO NOT use if you have an allergy to chlorhexidine /CHG or antibacterial soaps. If your skin becomes reddened or irritated, stop using the CHG and notify one of our RNs at [phone].   Please shower with the CHG soap  starting 4 days before surgery using the following schedule:     Please keep in mind the following:  DO NOT shave, including legs and underarms, starting the day of your first shower.   You may shave your face at any point before/day of surgery.  Place clean sheets on your bed the day you start using CHG soap. Use a clean washcloth (not used since being washed) for each shower. DO NOT sleep with pets once you start using the CHG.   CHG Shower Instructions:  Wash your face and private area with normal soap. If you choose to wash your hair, wash first with your normal shampoo.  After you use shampoo/soap, rinse your hair and body thoroughly to remove shampoo/soap residue.  Turn the water OFF and apply  bottle of CHG soap to a CLEAN washcloth.  Apply CHG soap ONLY FROM YOUR NECK DOWN TO YOUR TOES (washing for 3-5 minutes)  DO NOT use CHG  soap on face, private areas, open wounds, or sores.  Pay special attention to the area where your surgery is being performed.  If you are having back surgery, having someone wash your back for you may be helpful. Wait 2 minutes after CHG soap is applied, then you may rinse off the CHG soap.  Pat dry with a clean towel  Put on clean clothes/pajamas   If you choose to wear lotion, please use ONLY the CHG-compatible lotions that are listed below.  Additional instructions for the day of surgery:  If you choose, you may shower the morning of surgery with an antibacterial soap.  DO NOT APPLY any lotions, deodorants, cologne, or perfumes.   Do not bring valuables to the hospital. [facility] is not responsible for any belongings/valuables. Do not wear nail polish, gel polish, artificial nails, or any other type of covering on natural nails (fingers and toes) Do not wear jewelry or makeup Put on clean/comfortable clothes.  Please brush your teeth.  Ask your nurse before applying any prescription medications to the skin.     CHG Compatible Lotions    Aveeno Moisturizing lotion  Cetaphil Moisturizing Cream  Cetaphil Moisturizing Lotion  Clairol Herbal Essence Moisturizing Lotion, Dry Skin  Clairol Herbal Essence Moisturizing Lotion, Extra Dry Skin  Clairol Herbal Essence Moisturizing Lotion, Normal Skin  Curel Age Defying Therapeutic Moisturizing Lotion with Alpha Hydroxy  Curel Extreme Care Body Lotion  Curel Soothing Hands Moisturizing Hand Lotion  Curel Therapeutic Moisturizing Cream, Fragrance-Free  Curel Therapeutic Moisturizing Lotion, Fragrance-Free  Curel Therapeutic Moisturizing Lotion, Original Formula  Eucerin Daily Replenishing Lotion  Eucerin Dry Skin Therapy Plus Alpha Hydroxy Crme  Eucerin Dry Skin Therapy Plus Alpha Hydroxy Lotion  Eucerin Original Crme  Eucerin Original Lotion  Eucerin Plus Crme Eucerin Plus Lotion  Eucerin TriLipid Replenishing Lotion  Keri Anti-Bacterial Hand Lotion  Keri Deep Conditioning Original Lotion Dry Skin Formula Softly Scented  Keri Deep Conditioning Original Lotion, Fragrance Free Sensitive Skin Formula  Keri Lotion Fast Absorbing Fragrance Free Sensitive Skin Formula  Keri Lotion Fast Absorbing Softly Scented Dry Skin Formula  Keri Original Lotion  Keri Skin Renewal Lotion Keri Silky Smooth Lotion  Keri Silky Smooth Sensitive Skin Lotion  Nivea Body Creamy Conditioning Oil  Nivea Body Extra Enriched Lotion  Nivea Body Original Lotion  Nivea Body Sheer Moisturizing Lotion Nivea Crme  Nivea Skin Firming Lotion  NutraDerm 30 Skin Lotion  NutraDerm Skin Lotion  NutraDerm Therapeutic Skin Cream  NutraDerm Therapeutic Skin Lotion  ProShield Protective Hand Cream  Provon moisturizing lotion  Please read over the following fact sheets that you were given.

## 2024-04-17 NOTE — Progress Notes (Signed)
 Pharmacy Quality Measure Review  This patient is appearing on a report for needing to complete her yearly uACR.   Order entered. I called the patient and she will try to come in tomorrow or Friday of this week to complete.   Ann Foster, PharmD, JAQUELINE, CPP Clinical Pharmacist [facility] [phone]

## 2024-04-18 ENCOUNTER — Encounter (HOSPITAL_COMMUNITY)
Admission: RE | Admit: 2024-04-18 | Discharge: 2024-04-18 | Disposition: A | Discharge status: Home / Self Care | Source: Ambulatory Visit | Attending: Orthopaedic Surgery | Admitting: Orthopaedic Surgery

## 2024-04-18 ENCOUNTER — Encounter (HOSPITAL_COMMUNITY): Payer: Self-pay

## 2024-04-18 ENCOUNTER — Other Ambulatory Visit: Payer: Self-pay

## 2024-04-18 VITALS — BP 135/73 | HR 86 | Temp 98.3°F | Resp 17 | Ht 63.0 in | Wt 197.0 lb

## 2024-04-18 DIAGNOSIS — Z01812 Encounter for preprocedural laboratory examination: Secondary | ICD-10-CM | POA: Diagnosis present

## 2024-04-18 DIAGNOSIS — Z01810 Encounter for preprocedural cardiovascular examination: Secondary | ICD-10-CM | POA: Diagnosis present

## 2024-04-18 DIAGNOSIS — Z79899 Other long term (current) drug therapy: Secondary | ICD-10-CM | POA: Insufficient documentation

## 2024-04-18 DIAGNOSIS — Z01818 Encounter for other preprocedural examination: Secondary | ICD-10-CM | POA: Diagnosis not present

## 2024-04-18 LAB — HEMOGLOBIN A1C
Hgb A1c MFr Bld: 6.8 % — ABNORMAL HIGH (ref 4.8–5.6)
Mean Plasma Glucose: 148.46 mg/dL

## 2024-04-18 LAB — GLUCOSE, CAPILLARY: Glucose-Capillary: 162 mg/dL — ABNORMAL HIGH (ref 70–99)

## 2024-04-18 LAB — CBC
HCT: 42.7 % (ref 36.0–46.0)
Hemoglobin: 13.8 g/dL (ref 12.0–15.0)
MCH: 30.8 pg (ref 26.0–34.0)
MCHC: 32.3 g/dL (ref 30.0–36.0)
MCV: 95.3 fL (ref 80.0–100.0)
Platelets: 310 K/uL (ref 150–400)
RBC: 4.48 MIL/uL (ref 3.87–5.11)
RDW: 13.2 % (ref 11.5–15.5)
WBC: 7.4 K/uL (ref 4.0–10.5)
nRBC: 0 % (ref 0.0–0.2)

## 2024-04-18 LAB — SURGICAL PCR SCREEN
MRSA, PCR: NEGATIVE
Staphylococcus aureus: NEGATIVE

## 2024-04-18 NOTE — Progress Notes (Addendum)
 PCP - Dr. Haze Servant Cardiologist - denies  PPM/ICD - denies Device Orders - n/a Rep Notified - n/a  Chest x-ray - n/a EKG - 04-18-24 Stress Test - denies ECHO - denies Cardiac Cath - denies  Sleep Study - 06-28-23, positive CPAP - wears nightly  Fasting Blood Sugar - 105-120  Checks Blood Sugar every morning  Last dose of GLP1 agonist-  Trulicity  GLP1 instructions: Last dose on 04-21-2024, hold for 7 days prior to surgery  Blood Thinner Instructions: denies Aspirin Instructions: denies  ERAS Protcol - clears  PRE-SURGERY Ensure or G2- none  COVID TEST- n/a   Anesthesia review: yes, HTN w/new EKG, OSA w/cpap, DM. Had right knee replacement 10/2023 w/ no complications afterwards   Patient denies shortness of breath, fever, cough and chest pain at PAT appointment. Patient denies any respiratory issues at this time.    All instructions explained to the patient, with a verbal understanding of the material. Patient agrees to go over the instructions while at home for a better understanding. Patient also instructed to self quarantine after being tested for COVID-19. The opportunity to ask questions was provided.

## 2024-04-26 MED ORDER — TRANEXAMIC ACID 1000 MG/10ML IV SOLN
2000.0000 mg | INTRAVENOUS | Status: DC
  Filled 2024-04-26: qty 20

## 2024-04-29 ENCOUNTER — Encounter (HOSPITAL_COMMUNITY): Admission: RE | Disposition: A | Payer: Self-pay | Source: Home / Self Care | Attending: Orthopaedic Surgery

## 2024-04-29 ENCOUNTER — Encounter (HOSPITAL_COMMUNITY): Payer: Self-pay | Admitting: Orthopaedic Surgery

## 2024-04-29 ENCOUNTER — Telehealth (HOSPITAL_COMMUNITY): Payer: Self-pay | Admitting: Pharmacy Technician

## 2024-04-29 ENCOUNTER — Ambulatory Visit (HOSPITAL_COMMUNITY): Admitting: Certified Registered Nurse Anesthetist

## 2024-04-29 ENCOUNTER — Ambulatory Visit (HOSPITAL_COMMUNITY): Payer: Self-pay | Admitting: Physician Assistant

## 2024-04-29 ENCOUNTER — Other Ambulatory Visit (HOSPITAL_COMMUNITY): Payer: Self-pay

## 2024-04-29 ENCOUNTER — Observation Stay (HOSPITAL_COMMUNITY)
Admission: RE | Admit: 2024-04-29 | Discharge: 2024-04-30 | Disposition: A | Discharge status: Home / Self Care | Attending: Orthopaedic Surgery | Admitting: Orthopaedic Surgery

## 2024-04-29 ENCOUNTER — Other Ambulatory Visit: Payer: Self-pay | Admitting: Physician Assistant

## 2024-04-29 ENCOUNTER — Other Ambulatory Visit: Payer: Self-pay

## 2024-04-29 ENCOUNTER — Observation Stay (HOSPITAL_COMMUNITY)

## 2024-04-29 DIAGNOSIS — E119 Type 2 diabetes mellitus without complications: Secondary | ICD-10-CM | POA: Insufficient documentation

## 2024-04-29 DIAGNOSIS — M1712 Unilateral primary osteoarthritis, left knee: Secondary | ICD-10-CM | POA: Diagnosis not present

## 2024-04-29 DIAGNOSIS — F17210 Nicotine dependence, cigarettes, uncomplicated: Secondary | ICD-10-CM | POA: Insufficient documentation

## 2024-04-29 DIAGNOSIS — Z96652 Presence of left artificial knee joint: Secondary | ICD-10-CM

## 2024-04-29 DIAGNOSIS — M25562 Pain in left knee: Secondary | ICD-10-CM | POA: Diagnosis present

## 2024-04-29 DIAGNOSIS — Z01818 Encounter for other preprocedural examination: Secondary | ICD-10-CM

## 2024-04-29 DIAGNOSIS — I10 Essential (primary) hypertension: Secondary | ICD-10-CM | POA: Diagnosis not present

## 2024-04-29 DIAGNOSIS — Z79899 Other long term (current) drug therapy: Secondary | ICD-10-CM | POA: Diagnosis not present

## 2024-04-29 DIAGNOSIS — Z7901 Long term (current) use of anticoagulants: Secondary | ICD-10-CM | POA: Diagnosis not present

## 2024-04-29 HISTORY — PX: TOTAL KNEE ARTHROPLASTY: SHX125

## 2024-04-29 HISTORY — DX: Gastro-esophageal reflux disease without esophagitis: K21.9

## 2024-04-29 LAB — GLUCOSE, CAPILLARY
Glucose-Capillary: 140 mg/dL — ABNORMAL HIGH (ref 70–99)
Glucose-Capillary: 113 mg/dL — ABNORMAL HIGH (ref 70–99)
Glucose-Capillary: 92 mg/dL (ref 70–99)
Glucose-Capillary: 99 mg/dL (ref 70–99)
Glucose-Capillary: 129 mg/dL — ABNORMAL HIGH (ref 70–99)
Glucose-Capillary: 154 mg/dL — ABNORMAL HIGH (ref 70–99)

## 2024-04-29 SURGERY — ARTHROPLASTY, KNEE, TOTAL
Anesthesia: Spinal | Site: Knee | Laterality: Left

## 2024-04-29 MED ORDER — TRANEXAMIC ACID-NACL 1000-0.7 MG/100ML-% IV SOLN
1000.0000 mg | Freq: Once | INTRAVENOUS | Status: AC
  Administered 2024-04-29: 1000 mg via INTRAVENOUS
  Filled 2024-04-29: qty 100

## 2024-04-29 MED ORDER — BUPIVACAINE IN DEXTROSE 0.75-8.25 % IT SOLN
INTRATHECAL | Status: DC | PRN
  Administered 2024-04-29: 2 mL via INTRATHECAL

## 2024-04-29 MED ORDER — PHENOL 1.4 % MT LIQD: 1.0000 | OROMUCOSAL | Status: DC | PRN

## 2024-04-29 MED ORDER — METHOCARBAMOL 500 MG PO TABS
500.0000 mg | ORAL_TABLET | Freq: Four times a day (QID) | ORAL | Status: DC | PRN
  Administered 2024-04-29: 500 mg via ORAL
  Filled 2024-04-29: qty 1

## 2024-04-29 MED ORDER — ONDANSETRON HCL 4 MG/2ML IJ SOLN
INTRAMUSCULAR | Status: AC
  Filled 2024-04-29: qty 2

## 2024-04-29 MED ORDER — KETOROLAC TROMETHAMINE 15 MG/ML IJ SOLN
7.5000 mg | Freq: Four times a day (QID) | INTRAMUSCULAR | Status: AC
  Administered 2024-04-29 – 2024-04-30 (×4): 7.5 mg via INTRAVENOUS
  Filled 2024-04-29 (×4): qty 1

## 2024-04-29 MED ORDER — VANCOMYCIN HCL 1000 MG IV SOLR
INTRAVENOUS | Status: AC
  Filled 2024-04-29: qty 20

## 2024-04-29 MED ORDER — PROPOFOL 500 MG/50ML IV EMUL
INTRAVENOUS | Status: DC | PRN
  Administered 2024-04-29: 20 mg via INTRAVENOUS
  Administered 2024-04-29: 50 ug/kg/min via INTRAVENOUS
  Administered 2024-04-29: 20 mg via INTRAVENOUS

## 2024-04-29 MED ORDER — BUPIVACAINE-MELOXICAM ER 400-12 MG/14ML IJ SOLN
INTRAMUSCULAR | Status: AC
  Filled 2024-04-29: qty 1

## 2024-04-29 MED ORDER — DOCUSATE SODIUM 100 MG PO CAPS
100.0000 mg | ORAL_CAPSULE | Freq: Two times a day (BID) | ORAL | Status: DC
  Administered 2024-04-29 – 2024-04-30 (×3): 100 mg via ORAL
  Filled 2024-04-29 (×3): qty 1

## 2024-04-29 MED ORDER — CEFAZOLIN SODIUM-DEXTROSE 2-4 GM/100ML-% IV SOLN
2.0000 g | Freq: Four times a day (QID) | INTRAVENOUS | Status: AC
  Administered 2024-04-29 (×2): 2 g via INTRAVENOUS
  Filled 2024-04-29 (×2): qty 100

## 2024-04-29 MED ORDER — RAMIPRIL 1.25 MG PO CAPS
2.5000 mg | ORAL_CAPSULE | Freq: Every day | ORAL | Status: DC
  Administered 2024-04-30: 2.5 mg via ORAL
  Filled 2024-04-29: qty 2

## 2024-04-29 MED ORDER — ONDANSETRON HCL 4 MG/2ML IJ SOLN
4.0000 mg | Freq: Four times a day (QID) | INTRAMUSCULAR | Status: DC | PRN
  Administered 2024-04-30: 4 mg via INTRAVENOUS
  Filled 2024-04-29: qty 2

## 2024-04-29 MED ORDER — APIXABAN 2.5 MG PO TABS
2.5000 mg | ORAL_TABLET | ORAL | 0 refills | Status: AC
  Filled 2024-04-29: qty 60, 30d supply, fill #0

## 2024-04-29 MED ORDER — BUPROPION HCL ER (SR) 150 MG PO TB12
150.0000 mg | ORAL_TABLET | Freq: Two times a day (BID) | ORAL | Status: DC
  Administered 2024-04-29 – 2024-04-30 (×2): 150 mg via ORAL
  Filled 2024-04-29 (×2): qty 1

## 2024-04-29 MED ORDER — CEFAZOLIN SODIUM-DEXTROSE 2-4 GM/100ML-% IV SOLN
2.0000 g | INTRAVENOUS | Status: AC
  Administered 2024-04-29: 2 g via INTRAVENOUS
  Filled 2024-04-29: qty 100

## 2024-04-29 MED ORDER — MIDAZOLAM HCL (PF) 2 MG/2ML IJ SOLN: 2.0000 mg | Freq: Once | INTRAMUSCULAR | Status: AC

## 2024-04-29 MED ORDER — INSULIN ASPART 100 UNIT/ML IJ SOLN: 0.0000 [IU] | Freq: Every day | INTRAMUSCULAR | Status: DC

## 2024-04-29 MED ORDER — POVIDONE-IODINE 10 % EX SWAB
2.0000 | Freq: Once | CUTANEOUS | Status: AC
  Administered 2024-04-29: 2 via TOPICAL

## 2024-04-29 MED ORDER — 0.9 % SODIUM CHLORIDE (POUR BTL) OPTIME
TOPICAL | Status: DC | PRN
  Administered 2024-04-29: 1000 mL

## 2024-04-29 MED ORDER — PRONTOSAN WOUND IRRIGATION OPTIME
TOPICAL | Status: DC | PRN
  Administered 2024-04-29: 3.5 mL

## 2024-04-29 MED ORDER — LIDOCAINE 2% (20 MG/ML) 5 ML SYRINGE
INTRAMUSCULAR | Status: DC | PRN
  Administered 2024-04-29: 20 mg via INTRAVENOUS

## 2024-04-29 MED ORDER — METOCLOPRAMIDE HCL 5 MG PO TABS: 5.0000 mg | ORAL_TABLET | Freq: Three times a day (TID) | ORAL | Status: DC | PRN

## 2024-04-29 MED ORDER — HYDROMORPHONE HCL 1 MG/ML IJ SOLN: 0.2500 mg | INTRAMUSCULAR | Status: DC | PRN

## 2024-04-29 MED ORDER — SODIUM CHLORIDE 0.9 % IR SOLN
Status: DC | PRN
  Administered 2024-04-29: 1000 mL

## 2024-04-29 MED ORDER — ORAL CARE MOUTH RINSE: 15.0000 mL | Freq: Once | OROMUCOSAL | Status: AC

## 2024-04-29 MED ORDER — INSULIN ASPART 100 UNIT/ML IJ SOLN
0.0000 [IU] | INTRAMUSCULAR | Status: DC | PRN
  Administered 2024-04-29: 2 [IU] via SUBCUTANEOUS
  Filled 2024-04-29: qty 2

## 2024-04-29 MED ORDER — APIXABAN 2.5 MG PO TABS
2.5000 mg | ORAL_TABLET | Freq: Two times a day (BID) | ORAL | Status: DC
  Administered 2024-04-30: 2.5 mg via ORAL
  Filled 2024-04-29: qty 1

## 2024-04-29 MED ORDER — DIPHENHYDRAMINE HCL 25 MG PO CAPS
25.0000 mg | ORAL_CAPSULE | Freq: Four times a day (QID) | ORAL | Status: DC | PRN
  Administered 2024-04-29 – 2024-04-30 (×2): 25 mg via ORAL
  Filled 2024-04-29 (×2): qty 1

## 2024-04-29 MED ORDER — FENTANYL CITRATE (PF) 100 MCG/2ML IJ SOLN
INTRAMUSCULAR | Status: AC
  Administered 2024-04-29: 50 ug via INTRAVENOUS
  Filled 2024-04-29: qty 2

## 2024-04-29 MED ORDER — MIDAZOLAM HCL 2 MG/2ML IJ SOLN
INTRAMUSCULAR | Status: AC
  Administered 2024-04-29: 2 mg via INTRAVENOUS
  Filled 2024-04-29: qty 2

## 2024-04-29 MED ORDER — ONDANSETRON HCL 4 MG PO TABS: 4.0000 mg | ORAL_TABLET | Freq: Four times a day (QID) | ORAL | Status: DC | PRN

## 2024-04-29 MED ORDER — ROPIVACAINE HCL 5 MG/ML IJ SOLN
INTRAMUSCULAR | Status: DC | PRN
  Administered 2024-04-29: 20 mL via PERINEURAL

## 2024-04-29 MED ORDER — OXYCODONE HCL 5 MG PO TABS
10.0000 mg | ORAL_TABLET | Freq: Four times a day (QID) | ORAL | Status: DC | PRN
  Administered 2024-04-29 – 2024-04-30 (×3): 10 mg via ORAL
  Filled 2024-04-29 (×3): qty 2

## 2024-04-29 MED ORDER — ACETAMINOPHEN 325 MG PO TABS: 325.0000 mg | ORAL_TABLET | Freq: Four times a day (QID) | ORAL | Status: DC | PRN

## 2024-04-29 MED ORDER — METOCLOPRAMIDE HCL 5 MG/ML IJ SOLN: 5.0000 mg | Freq: Three times a day (TID) | INTRAMUSCULAR | Status: DC | PRN

## 2024-04-29 MED ORDER — LACTATED RINGERS IV SOLN: INTRAVENOUS | Status: DC

## 2024-04-29 MED ORDER — OXYCODONE HCL 5 MG PO TABS: 5.0000 mg | ORAL_TABLET | Freq: Four times a day (QID) | ORAL | Status: DC | PRN

## 2024-04-29 MED ORDER — OXYCODONE HCL 5 MG PO TABS: 5.0000 mg | ORAL_TABLET | Freq: Once | ORAL | Status: DC | PRN

## 2024-04-29 MED ORDER — VANCOMYCIN HCL 1000 MG IV SOLR
INTRAVENOUS | Status: DC | PRN
  Administered 2024-04-29: 1000 mg

## 2024-04-29 MED ORDER — TRANEXAMIC ACID 1000 MG/10ML IV SOLN
INTRAVENOUS | Status: DC | PRN
  Administered 2024-04-29: 2000 mg via TOPICAL

## 2024-04-29 MED ORDER — METHOCARBAMOL 1000 MG/10ML IJ SOLN: 500.0000 mg | Freq: Four times a day (QID) | INTRAMUSCULAR | Status: DC | PRN

## 2024-04-29 MED ORDER — DEXAMETHASONE SOD PHOSPHATE PF 10 MG/ML IJ SOLN
10.0000 mg | Freq: Once | INTRAMUSCULAR | Status: AC
  Administered 2024-04-30: 10 mg via INTRAVENOUS

## 2024-04-29 MED ORDER — FENTANYL CITRATE (PF) 100 MCG/2ML IJ SOLN: 50.0000 ug | Freq: Once | INTRAMUSCULAR | Status: AC

## 2024-04-29 MED ORDER — ONDANSETRON HCL 4 MG/2ML IJ SOLN
4.0000 mg | Freq: Once | INTRAMUSCULAR | Status: AC
  Administered 2024-04-29: 4 mg via INTRAVENOUS

## 2024-04-29 MED ORDER — MENTHOL 3 MG MT LOZG: 1.0000 | LOZENGE | OROMUCOSAL | Status: DC | PRN

## 2024-04-29 MED ORDER — OXYCODONE HCL 5 MG/5ML PO SOLN: 5.0000 mg | Freq: Once | ORAL | Status: DC | PRN

## 2024-04-29 MED ORDER — HYDROMORPHONE HCL 1 MG/ML IJ SOLN: 1.0000 mg | Freq: Three times a day (TID) | INTRAMUSCULAR | Status: DC | PRN

## 2024-04-29 MED ORDER — INSULIN ASPART 100 UNIT/ML IJ SOLN
0.0000 [IU] | Freq: Three times a day (TID) | INTRAMUSCULAR | Status: DC
  Administered 2024-04-30: 3 [IU] via SUBCUTANEOUS
  Filled 2024-04-29: qty 3

## 2024-04-29 MED ORDER — CHLORHEXIDINE GLUCONATE 0.12 % MT SOLN
15.0000 mL | Freq: Once | OROMUCOSAL | Status: AC
  Administered 2024-04-29: 15 mL via OROMUCOSAL
  Filled 2024-04-29: qty 15

## 2024-04-29 MED ORDER — SODIUM CHLORIDE 0.9 % IV SOLN: INTRAVENOUS | Status: DC

## 2024-04-29 MED ORDER — ACETAMINOPHEN 500 MG PO TABS
1000.0000 mg | ORAL_TABLET | Freq: Four times a day (QID) | ORAL | Status: AC
  Administered 2024-04-29 – 2024-04-30 (×4): 1000 mg via ORAL
  Filled 2024-04-29 (×4): qty 2

## 2024-04-29 MED ORDER — TRANEXAMIC ACID-NACL 1000-0.7 MG/100ML-% IV SOLN
1000.0000 mg | INTRAVENOUS | Status: AC
  Administered 2024-04-29: 1000 mg via INTRAVENOUS
  Filled 2024-04-29: qty 100

## 2024-04-29 MED ORDER — BUPIVACAINE-MELOXICAM ER 400-12 MG/14ML IJ SOLN
INTRAMUSCULAR | Status: DC | PRN
  Administered 2024-04-29: 400 mg

## 2024-04-29 MED ORDER — PROPOFOL 10 MG/ML IV BOLUS
INTRAVENOUS | Status: AC
  Filled 2024-04-29: qty 20

## 2024-04-29 SURGICAL SUPPLY — 65 items
ALCOHOL 70% 16 OZ (MISCELLANEOUS) ×1 IMPLANT
BAG COUNTER SPONGE SURGICOUNT (BAG) ×1 IMPLANT
BAG DECANTER FOR FLEXI CONT (MISCELLANEOUS) ×1 IMPLANT
BLADE SAG 18X100X1.27 (BLADE) ×1 IMPLANT
BLADE SAW SAG 90X13X1.27 (BLADE) ×1 IMPLANT
BLADE SAW SGTL 73X25 THK (BLADE) ×1 IMPLANT
BNDG COMPR ESMARK 6X3 LF (GAUZE/BANDAGES/DRESSINGS) IMPLANT
BOWL SMART MIX CTS (DISPOSABLE) IMPLANT
CEMENT BONE REFOBACIN R1X40 US (Cement) IMPLANT
CLSR STERI-STRIP ANTIMIC 1/2X4 (GAUZE/BANDAGES/DRESSINGS) IMPLANT
COMPONENT FEM CMT PRSN STD SZ7 (Knees) IMPLANT
COMPONET TIB PS KNEE D 0D LT (Joint) IMPLANT
COOLER ICEMAN CLASSIC (MISCELLANEOUS) ×1 IMPLANT
COVER SURGICAL LIGHT HANDLE (MISCELLANEOUS) ×1 IMPLANT
CUFF TOURN SGL QUICK 42 (TOURNIQUET CUFF) IMPLANT
CUFF TRNQT CYL 34X4.125X (TOURNIQUET CUFF) ×1 IMPLANT
DERMABOND ADVANCED .7 DNX12 (GAUZE/BANDAGES/DRESSINGS) ×1 IMPLANT
DRAPE EXTREMITY T 121X128X90 (DISPOSABLE) ×1 IMPLANT
DRAPE HALF SHEET 40X57 (DRAPES) ×1 IMPLANT
DRAPE INCISE IOBAN 66X45 STRL (DRAPES) ×1 IMPLANT
DRAPE POUCH INSTRU U-SHP 10X18 (DRAPES) ×1 IMPLANT
DRAPE SURG ORHT 6 SPLT 77X108 (DRAPES) IMPLANT
DRAPE U-SHAPE 47X51 STRL (DRAPES) ×2 IMPLANT
DRSG AQUACEL AG ADV 3.5X10 (GAUZE/BANDAGES/DRESSINGS) ×1 IMPLANT
DURAPREP 26ML APPLICATOR (WOUND CARE) ×3 IMPLANT
ELECT CAUTERY BLADE 6.4 (BLADE) ×1 IMPLANT
ELECT PENCIL ROCKER SW 15FT (MISCELLANEOUS) ×1 IMPLANT
ELECTRODE REM PT RTRN 9FT ADLT (ELECTROSURGICAL) ×1 IMPLANT
GLOVE BIOGEL PI IND STRL 7.5 (GLOVE) ×1 IMPLANT
GLOVE BIOGEL PI MICRO STRL 7 (GLOVE) ×5 IMPLANT
GLOVE INDICATOR 7.0 STRL GRN (GLOVE) ×1 IMPLANT
GLOVE SURG SYN 7.5 PF PI (GLOVE) ×5 IMPLANT
GOWN STRL REUS W/ TWL LRG LVL3 (GOWN DISPOSABLE) ×2 IMPLANT
GOWN TOGA ZIPPER T7+ PEEL AWAY (MISCELLANEOUS) ×1 IMPLANT
HOOD PEEL AWAY T7 (MISCELLANEOUS) ×1 IMPLANT
INSERT ARTISURF SZ 6-7 LT (Insert) IMPLANT
IV 0.9% NACL 1000 ML (IV SOLUTION) ×1 IMPLANT
KIT BASIN OR (CUSTOM PROCEDURE TRAY) ×1 IMPLANT
KIT TURNOVER KIT B (KITS) ×1 IMPLANT
MANIFOLD NEPTUNE II (INSTRUMENTS) ×1 IMPLANT
MARKER SKIN DUAL TIP RULER LAB (MISCELLANEOUS) ×2 IMPLANT
NDL SPNL 18GX3.5 QUINCKE PK (NEEDLE) ×1 IMPLANT
PACK TOTAL JOINT (CUSTOM PROCEDURE TRAY) ×1 IMPLANT
PAD ARMBOARD POSITIONER FOAM (MISCELLANEOUS) ×2 IMPLANT
PAD COLD SHLDR WRAP-ON (PAD) ×1 IMPLANT
PIN DRILL HDLS TROCAR 75 4PK (PIN) IMPLANT
SCREW FEMALE HEX FIX 25X2.5 (ORTHOPEDIC DISPOSABLE SUPPLIES) IMPLANT
SET HNDPC FAN SPRY TIP SCT (DISPOSABLE) ×1 IMPLANT
SOLN 0.9% NACL POUR BTL 1000ML (IV SOLUTION) ×1 IMPLANT
SOLUTION PRONTOSAN WOUND 350ML (IRRIGATION / IRRIGATOR) ×1 IMPLANT
STAPLER SKIN PROX 35W (STAPLE) IMPLANT
STEM POLY PAT PLY 29M KNEE (Knees) IMPLANT
SUCTION TUBE FRAZIER 10FR DISP (SUCTIONS) IMPLANT
SUT ETHILON 2 0 FS 18 (SUTURE) ×2 IMPLANT
SUT STRATAFIX PDS+ 0 24IN (SUTURE) ×1 IMPLANT
SUT VIC AB 0 CT1 27XBRD ANBCTR (SUTURE) ×1 IMPLANT
SUT VIC AB 1 CTX 27 (SUTURE) IMPLANT
SUT VIC AB 1 CTX36XBRD ANBCTR (SUTURE) IMPLANT
SUT VIC AB 2-0 CT1 TAPERPNT 27 (SUTURE) ×3 IMPLANT
SYR 30ML LL (SYRINGE) ×2 IMPLANT
TOWEL GREEN STERILE (TOWEL DISPOSABLE) ×1 IMPLANT
TOWEL GREEN STERILE FF (TOWEL DISPOSABLE) ×1 IMPLANT
TUBE SUCT ARGYLE STRL (TUBING) ×1 IMPLANT
UNDERPAD 30X36 HEAVY ABSORB (UNDERPADS AND DIAPERS) ×1 IMPLANT
YANKAUER SUCT BULB TIP NO VENT (SUCTIONS) ×1 IMPLANT

## 2024-04-29 NOTE — Transfer of Care (Signed)
 Immediate Anesthesia Transfer of Care Note  Patient: Ann Foster  Procedure(s) Performed: ARTHROPLASTY, KNEE, TOTAL (Left: Knee)  Patient Location: PACU  Anesthesia Type:Spinal  Level of Consciousness: awake, alert , oriented, drowsy, and patient cooperative  Airway & Oxygen Therapy: Patient Spontanous Breathing  Post-op Assessment: Report given to RN and Post -op Vital signs reviewed and stable  Post vital signs: Reviewed and stable  Last Vitals:  Vitals Value Taken Time  BP 128/84 04/29/24 10:43  Temp    Pulse 79 04/29/24 10:44  Resp 18 04/29/24 10:44  SpO2 94 % 04/29/24 10:44  Vitals shown include unfiled device data.  Last Pain:  Vitals:   04/29/24 0759  TempSrc:   PainSc: 0-No pain      Patients Stated Pain Goal: 0 (04/29/24 9357)  Complications: No notable events documented.

## 2024-04-29 NOTE — Op Note (Signed)
 Total Knee Arthroplasty Procedure Note  Preoperative diagnosis: Left knee osteoarthritis  Postoperative diagnosis:same  Operative findings: Severe OA  Operative procedure: Left total knee arthroplasty. CPT [phone]  Surgeon: N. Ozell Cummins, MD  Assist: Eva Staple, RNFA  Anesthesia: Spinal, regional, local  Tourniquet time: see anesthesia record  Implants used: Zimmer persona Femur: CR 7 Tibia: D Patella: 29 mm Polyethylene: 10 mm medial congruent  Indication: Ann Foster is a 58 y.o. year old female with a history of knee pain. Having failed conservative management, the patient elected to proceed with a total knee arthroplasty.  We have reviewed the risk and benefits of the surgery and they elected to proceed after voicing understanding.  Procedure:  After informed consent was obtained and understanding of the risk were voiced including but not limited to bleeding, infection, damage to surrounding structures including nerves and vessels, blood clots, leg length inequality and the failure to achieve desired results, the operative extremity was marked with verbal confirmation of the patient in the holding area.   The patient was then brought to the operating room and transported to the operating room table in the supine position.  A tourniquet was applied to the operative extremity around the upper thigh. The operative limb was then prepped and draped in the usual sterile fashion and preoperative antibiotics were administered.  A time out was performed prior to the start of surgery confirming the correct extremity, preoperative antibiotic administration, as well as team members, implants and instruments available for the case. Correct surgical site was also confirmed with preoperative radiographs. The limb was then elevated for exsanguination and the tourniquet was inflated. A midline incision was made and a standard medial parapatellar approach was performed.  The infrapatellar  fat pad was removed.  Suprapatellar synovium was removed to reveal the anterior distal femoral cortex.  A medial peel was performed to release the capsule and the deep MCL off of the medial tibial plateau back to the semimembranosus.  The patella was then everted which showed complete loss of articular cartilage and was resected down to 13 mm and sized to a 29 mm.  A cover was placed on the patella for protection from retractors.  The knee was then brought into flexion and we then turned our attention to the femur.  The ACL was sacrificed.  Start site was drilled in the femur and the intramedullary distal femoral cutting guide was placed, set at 5 degrees valgus, taking 10 mm of distal resection. The distal cut was made. Osteophytes were then removed.  Next, the proximal tibial cutting guide was placed with appropriate slope, varus/valgus alignment and depth of resection.  The drop rod was attached to confirm that it was aimed at the second metatarsal.  The proximal tibial cut was made taking 2 mm off the low side. Gap blocks were then used to assess the extension gap and alignment, and appropriate soft tissue releases were performed. Attention was turned back to the femur, which was sized using the sizing guide to a size 7 standard. Appropriate rotation of the femoral component was determined using epicondylar axis, Whiteside's line, and assessing the flexion gap under ligament tension. The appropriate size 4-in-1 cutting block was placed and checked with an angel wing and cuts were made. Posterior femoral osteophytes and uncapped bone were then removed with the curved osteotome.  The menisci were removed.  Trial components were placed, and stability was checked in full extension, mid-flexion, and deep flexion.  PCL was resected. Proper  tibial rotation was determined and marked.  The patella tracked well without a lateral release.  The femoral lugs were then drilled. Trial components were then removed and tibial  preparation performed.  The trial tibia was pointed to the medial third of the tibial tubercle.  The tibia was sized for a size D component and prepared.  Trial components were removed.    The bony surfaces were irrigated with a pulse lavage and then dried. Bone cement was vacuum mixed on the back table, and the final components sized above were cemented into place.  Antibiotic irrigation was placed in the knee joint and soft tissues while the cement cured.  After cement had finished curing, excess cement was removed. The stability of the construct was re-evaluated throughout a range of motion and found to be acceptable. The trial liner was removed, the knee was copiously irrigated, and the knee was re-evaluated for any excess bone debris. The real polyethylene liner, 10 mm thick, was inserted and checked to ensure the locking mechanism had engaged appropriately. The tourniquet was deflated and hemostasis was achieved. The wound was irrigated with normal saline.  One gram of vancomycin  powder was placed in the surgical bed.  Topical mixture of 0.25% bupivacaine  and meloxicam  was placed in the joint for postoperative pain.  Capsular closure was performed with a #1 stratafix in flexion, subcutaneous fat closed with a 0 vicryl suture, then subcutaneous tissue closed with interrupted 2.0 vicryl suture. The skin was then closed with a 2.0 nylon and dermabond. A sterile dressing was applied.  The patient was awakened in the operating room and taken to recovery in stable condition. All sponge, needle, and instrument counts were correct at the end of the case.  Position: supine  Complications: none.  Time Out: performed   Drains/Packing: none Estimated blood loss: minimal Returned to Recovery Room: in good condition.   Mechanical VTE (DVT) Prophylaxis: sequential compression devices, TED thigh-high  Chemical VTE (DVT) Prophylaxis: eliquis  POD 1  Fluid Replacement  Crystalloid: see anesthesia record Blood:  none  FFP: none   Specimens Removed: 1 to pathology  Sponge and Instrument Count Correct? yes  PACU: portable radiograph - knee AP and Lateral  Plan/RTC: Return in 2 weeks for suture removal.  Weight Bearing/Load Lower Extremity: full   Implant Name Type Inv. Item Serial No. Manufacturer Lot No. LRB No. Used Action  CEMENT BONE REFOBACIN [id number] US  - [id number] Cement CEMENT BONE REFOBACIN [id number] US   ZIMMER RECON(ORTH,TRAU,BIO,SG) [id number] Left 2 Implanted  COMPONET TIB PS KNEE D 0D LT - [id number] Joint COMPONET TIB PS KNEE D 0D LT  ZIMMER RECON(ORTH,TRAU,BIO,SG) [id number] Left 1 Implanted  COMPONENT FEM CMT PRSN STD SZ7 - [id number] Knees COMPONENT FEM CMT PRSN STD SZ7  ZIMMER RECON(ORTH,TRAU,BIO,SG) [id number] Left 1 Implanted  INSERT ARTISURF SZ 6-7 LT - [id number] Insert INSERT ARTISURF SZ 6-7 LT  ZIMMER RECON(ORTH,TRAU,BIO,SG) [id number] Left 1 Implanted  STEM POLY PAT PLY 39M KNEE - [id number] Knees STEM POLY PAT PLY 39M KNEE  ZIMMER RECON(ORTH,TRAU,BIO,SG) [id number] Left 1 Implanted    N. Ozell Cummins, MD [facility] 10:01 AM

## 2024-04-29 NOTE — Evaluation (Signed)
 Occupational Therapy Evaluation Patient Details Name: Ann Foster MRN: [id number] DOB: [birth year] Today's Date: 04/29/2024   History of Present Illness   Pt is 58 year old presented to [facility] on  04/29/24 for lt TKR. PMH - Rt TKR, DM, arthritis     Clinical Impressions Patient s/p L TKA.  Patient had a R TKA in June of 2025.  Patient is up and moving well, needing Mod I for toileting, and is able to complete lower body ADL without assist.  No further OT needs in the acute setting, defer to PT for stairs and higher level mobility.  No post acute OT indicated.       If plan is discharge home, recommend the following:         Functional Status Assessment   Patient has had a recent decline in their functional status and demonstrates the ability to make significant improvements in function in a reasonable and predictable amount of time.     Equipment Recommendations   None recommended by OT     Recommendations for Other Services         Precautions/Restrictions   Precautions Recall of Precautions/Restrictions: Intact Restrictions Weight Bearing Restrictions Per Provider Order: Yes LLE Weight Bearing Per Provider Order: Weight bearing as tolerated     Mobility Bed Mobility Overal bed mobility: Modified Independent                  Transfers Overall transfer level: Modified independent Equipment used: Rolling walker (2 wheels)                      Balance Overall balance assessment: Mild deficits observed, not formally tested                                         ADL either performed or assessed with clinical judgement   ADL Overall ADL's : Modified independent                                             Vision Patient Visual Report: No change from baseline Vision Assessment?: No apparent visual deficits     Perception Perception: Not tested       Praxis Praxis: Not tested        Pertinent Vitals/Pain Pain Assessment Pain Assessment: Faces Faces Pain Scale: Hurts little more Pain Location: L knee Pain Descriptors / Indicators: Aching Pain Intervention(s): Premedicated before session     Extremity/Trunk Assessment Upper Extremity Assessment Upper Extremity Assessment: Overall WFL for tasks assessed   Lower Extremity Assessment Lower Extremity Assessment: Defer to PT evaluation   Cervical / Trunk Assessment Cervical / Trunk Assessment: Normal   Communication Communication Communication: No apparent difficulties   Cognition Arousal: Alert Behavior During Therapy: WFL for tasks assessed/performed Cognition: No apparent impairments                               Following commands: Intact       Cueing  General Comments   Cueing Techniques: Verbal cues      Exercises     Shoulder Instructions      Home Living Family/patient expects to be discharged to:: Private residence Living Arrangements: Children  Available Help at Discharge: Family;Available PRN/intermittently Type of Home: House Home Access: Stairs to enter [employer] of Steps: 2 Entrance Stairs-Rails: None Home Layout: One level     Bathroom Shower/Tub: Producer, Television/film/video: Standard Bathroom Accessibility: Yes How Accessible: Accessible via walker Home Equipment: Rollator (4 wheels);BSC/3in1          Prior Functioning/Environment Prior Level of Function : Independent/Modified Independent               ADLs Comments: indep    OT Problem List: Decreased range of motion;Pain   OT Treatment/Interventions:        OT Goals(Current goals can be found in the care plan section)   Acute Rehab OT Goals Patient Stated Goal: Return home OT Goal Formulation: With patient Time For Goal Achievement: 05/03/24 Potential to Achieve Goals: Good   OT Frequency:       Co-evaluation              AM-PAC OT 6 Clicks Daily  Activity     Outcome Measure Help from another person eating meals?: None Help from another person taking care of personal grooming?: None Help from another person toileting, which includes using toliet, bedpan, or urinal?: None Help from another person bathing (including washing, rinsing, drying)?: None Help from another person to put on and taking off regular upper body clothing?: None Help from another person to put on and taking off regular lower body clothing?: None 6 Click Score: 24   End of Session Equipment Utilized During Treatment: Rolling walker (2 wheels) Nurse Communication: Mobility status  Activity Tolerance: Patient tolerated treatment well Patient left: in bed;with call bell/phone within reach  OT Visit Diagnosis: Pain Pain - Right/Left: Left Pain - part of body: Knee                Time: 8563-8541 OT Time Calculation (min): 22 min Charges:  OT General Charges $OT Visit: 1 Visit OT Evaluation $OT Eval Moderate Complexity: 1 Mod  04/29/2024  RP, OTR/L  Acute Rehabilitation Services  Office:  [phone]   Ann Foster 04/29/2024, 3:03 PM

## 2024-04-29 NOTE — Anesthesia Procedure Notes (Signed)
 Anesthesia Regional Block: Adductor canal block   Pre-Anesthetic Checklist: , timeout performed,  Correct Patient, Correct Site, Correct Laterality,  Correct Procedure, Correct Position, site marked,  Risks and benefits discussed,  Surgical consent,  Pre-op evaluation,  At surgeon's request and post-op pain management  Laterality: Left  Prep: chloraprep       Needles:  Injection technique: Single-shot  Needle Type: Stimiplex     Needle Length: 9cm  Needle Gauge: 21     Additional Needles:   Narrative:  Start time: 04/29/2024 8:01 AM End time: 04/29/2024 8:06 AM Injection made incrementally with aspirations every 5 mL.  Performed by: Personally  Anesthesiologist: Cleotilde Butler Dade, MD

## 2024-04-29 NOTE — Plan of Care (Signed)

## 2024-04-29 NOTE — Anesthesia Preprocedure Evaluation (Addendum)
 Anesthesia Evaluation  Patient identified by MRN, date of birth, ID band Patient awake    Reviewed: Allergy & Precautions, NPO status , Patient's Chart, lab work & pertinent test results, reviewed documented beta blocker date and time   History of Anesthesia Complications Negative for: history of anesthetic complications  Airway Mallampati: II  TM Distance: >3 FB     Dental  (+) Edentulous Upper, Edentulous Lower   Pulmonary sleep apnea and Continuous Positive Airway Pressure Ventilation , neg COPD, Current Smoker and Patient abstained from smoking., former smoker   breath sounds clear to auscultation       Cardiovascular hypertension, (-) angina (-) CAD, (-) Past MI, (-) Cardiac Stents and (-) CABG  Rhythm:Regular Rate:Normal     Neuro/Psych neg Seizures    GI/Hepatic   Endo/Other  diabetes, Type 2, Insulin  Dependent    Renal/GU      Musculoskeletal  (+) Arthritis , Osteoarthritis,    Abdominal   Peds  Hematology   Anesthesia Other Findings   Reproductive/Obstetrics                              Anesthesia Physical Anesthesia Plan  ASA: 3  Anesthesia Plan: Spinal   Post-op Pain Management: Regional block*   Induction: Intravenous  PONV Risk Score and Plan: 1 and Propofol  infusion and Treatment may vary due to age or medical condition  Airway Management Planned: Natural Airway and Simple Face Mask  Additional Equipment:   Intra-op Plan:   Post-operative Plan: Extubation in OR  Informed Consent: I have reviewed the patients History and Physical, chart, labs and discussed the procedure including the risks, benefits and alternatives for the proposed anesthesia with the patient or authorized representative who has indicated his/her understanding and acceptance.     Dental advisory given  Plan Discussed with: CRNA  Anesthesia Plan Comments:          Anesthesia Quick  Evaluation

## 2024-04-29 NOTE — Progress Notes (Signed)
 Orthopedic Tech Progress Note Patient Details:  Ann Foster Dec 18, 1965 [id number]  Ortho Devices Type of Ortho Device: Bone foam zero knee Ortho Device/Splint Location: LE Ortho Device/Splint Interventions: Ordered, Application   Post Interventions Patient Tolerated: Well  Giovan Pinsky [name] 04/29/2024, 12:55 PM

## 2024-04-29 NOTE — Telephone Encounter (Signed)
 Patient Product/process development scientist completed.    The patient is insured through [facility]. Patient has Medicare and is not eligible for a copay card, but may be able to apply for patient assistance or Medicare RX Payment Plan (Patient Must reach out to their plan, if eligible for payment plan), if available.    Ran test claim for Eliquis 2.5 mg and the current 30 day co-pay is $0.00.   This test claim was processed through [facility]- copay amounts may vary at other pharmacies due to pharmacy/plan contracts, or as the patient moves through the different stages of their insurance plan.     Reyes Sharps, CPHT Pharmacy Technician Patient Advocate Specialist [facility] Patient Advocate Team Direct Number: [phone]  Fax: [phone]

## 2024-04-29 NOTE — H&P (Signed)
 PREOPERATIVE H&P  Chief Complaint: left knee osteoarthritis  HPI: Ann Foster is a 58 y.o. female who presents for surgical treatment of left knee osteoarthritis.  She denies any changes in medical history.  Past Surgical History:  Procedure Laterality Date   CHOLECYSTECTOMY N/A 04/15/2023   Procedure: LAPAROSCOPIC CHOLECYSTECTOMY WITH ICG DYE;  Surgeon: Ebbie Cough, MD;  Location: [facility];  Service: General;  Laterality: N/A;   COLONOSCOPY     TOTAL KNEE ARTHROPLASTY Right 11/20/2023   Procedure: ARTHROPLASTY, KNEE, TOTAL RIGHT;  Surgeon: Jerri Kay HERO, MD;  Location: [facility];  Service: Orthopedics;  Laterality: Right;   Social History   Socioeconomic History   Marital status: Single    Spouse name: Not on file   Number of children: 3   Years of education: Not on file   Highest education level: 12th grade  Occupational History   Not on file  Tobacco Use   Smoking status: Every Day    Current packs/day: 0.00    Types: Cigarettes    Last attempt to quit: 11/28/2019    Years since quitting: 4.4   Smokeless tobacco: Never  Vaping Use   Vaping status: Never Used  Substance and Sexual Activity   Alcohol use: Not Currently   Drug use: No   Sexual activity: Not Currently    Birth control/protection: Condom  Other Topics Concern   Not on file  Social History Narrative   Left Handed   One story home       Social Drivers of Health   Financial Resource Strain: Low Risk  (03/11/2024)   Overall Financial Resource Strain (CARDIA)    Difficulty of Paying Living Expenses: Not hard at all  Food Insecurity: No Food Insecurity (03/11/2024)   Hunger Vital Sign    Worried About Running Out of Food in the Last Year: Never true    Ran Out of Food in the Last Year: Never true  Transportation Needs: No Transportation Needs (03/11/2024)   PRAPARE - Administrator, Civil Service (Medical): No    Lack of Transportation (Non-Medical): No  Physical Activity:  Sufficiently Active (03/11/2024)   Exercise Vital Sign    Days of Exercise per Week: 5 days    Minutes of Exercise per Session: 30 min  Stress: No Stress Concern Present (03/11/2024)   [employer] of Occupational Health - Occupational Stress Questionnaire    Feeling of Stress: Not at all  Social Connections: Moderately Isolated (03/11/2024)   Social Connection and Isolation Panel    Frequency of Communication with Friends and Family: More than three times a week    Frequency of Social Gatherings with Friends and Family: More than three times a week    Attends Religious Services: 1 to 4 times per year    Active Member of [employer] or Organizations: No    Attends Engineer, Structural: Not on file    Marital Status: Never married   Family History  Problem Relation Age of Onset   Breast cancer Mother    Colon cancer Father 27   Diabetes Son    Esophageal cancer Neg Hx    Stomach cancer Neg Hx    Liver disease Neg Hx    Pancreatic cancer Neg Hx    Rectal cancer Neg Hx    No Known Allergies Prior to Admission medications   Medication Sig Start Date End Date Taking? Authorizing Provider  albuterol  (VENTOLIN  HFA) 108 (90 Base) MCG/ACT inhaler Inhale  2 puffs into the lungs every 6 (six) hours as needed for wheezing or shortness of breath. 09/18/23  Yes Theotis Haze ORN, NP  atorvastatin  (LIPITOR) 80 MG tablet Take 1 tablet (80 mg total) by mouth daily. 02/27/24  Yes Fleming, Zelda W, NP  buPROPion  (WELLBUTRIN  SR) 150 MG 12 hr tablet TAKE 1 TABLET(150 MG) BY MOUTH TWICE DAILY 06/12/23  Yes Ajewole, Christana, MD  busPIRone  (BUSPAR ) 10 MG tablet TAKE 1 TABLET(10 MG) BY MOUTH TWICE DAILY Patient taking differently: Take 10 mg by mouth daily. 08/14/23  Yes Ajewole, Christana, MD  dapagliflozin  propanediol (FARXIGA ) 5 MG TABS tablet Take 1 tablet (5 mg total) by mouth daily before breakfast. 12/18/23  Yes Fleming, Zelda W, NP  docusate sodium  (COLACE) 100 MG capsule Take 1 capsule (100 mg  total) by mouth daily as needed. 04/15/24 04/15/25  Jule Ronal CROME, PA-C  doxycycline  (VIBRA -TABS) 100 MG tablet Take 1 tablet (100 mg total) by mouth 2 (two) times daily. To be taken after surgery 04/15/24   Jule Ronal CROME, PA-C  estradiol  (ESTRACE ) 0.1 MG/GM vaginal cream Apply 1 gram per vagina every night for 2 weeks, then apply three times a week 02/17/23  Yes Ajewole, Christana, MD  methocarbamol  (ROBAXIN ) 750 MG tablet Take 1 tablet (750 mg total) by mouth 3 (three) times daily as needed. 04/15/24   Jule Ronal CROME, PA-C  montelukast  (SINGULAIR ) 10 MG tablet Take 1 tablet (10 mg total) by mouth at bedtime. 02/16/23  Yes McClung, Jon HERO, PA-C  omeprazole  (PRILOSEC) 40 MG capsule Take 1 capsule (40 mg total) by mouth daily. Patient taking differently: Take 40 mg by mouth daily as needed (acid reflux). 11/29/21  Yes Fleming, Zelda W, NP  ondansetron  (ZOFRAN ) 4 MG tablet Take 1 tablet (4 mg total) by mouth every 8 (eight) hours as needed for nausea or vomiting. 04/15/24  Yes Jule Ronal CROME, PA-C  oxyCODONE -acetaminophen  (PERCOCET) 5-325 MG tablet Take 1-2 tablets by mouth every 6 (six) hours as needed. To be taken after surgery 04/15/24   Jule Ronal CROME, PA-C  ramipril  (ALTACE ) 2.5 MG capsule Take 1 capsule (2.5 mg total) by mouth daily. 12/18/23  Yes Theotis Haze ORN, NP  TRULICITY  3 MG/0.5ML SOAJ ADMINISTER 3 MG UNDER THE SKIN 1 TIME A WEEK AS DIRECTED 02/15/24  Yes Newlin, Enobong, MD  Accu-Chek Softclix Lancets lancets Check blood glucose level by fingerstick once per day. E11.65 09/27/23   Fleming, Zelda W, NP  Blood Glucose Monitoring Suppl (ACCU-CHEK GUIDE) w/Device KIT Check blood glucose level by fingerstick once per day. E11.65 05/07/21   Newlin, Enobong, MD  docusate sodium  (COLACE) 100 MG capsule Take 1 capsule (100 mg total) by mouth daily as needed. Patient not taking: Reported on 04/16/2024 11/14/23 11/13/24  Jule Ronal CROME, PA-C  glucose blood (ACCU-CHEK GUIDE) test strip Check  blood glucose level by fingerstick once per day. E11.65 02/16/23   Danton Jon HERO, PA-C  Insulin  Pen Needle (B-D UF III MINI PEN NEEDLES) 31G X 5 MM MISC Use as instructed. Inject into the skin once weekly 09/18/23   Fleming, Zelda W, NP  methocarbamol  (ROBAXIN ) 750 MG tablet Take 1 tablet (750 mg total) by mouth 3 (three) times daily as needed. Patient not taking: Reported on 04/16/2024 11/14/23   Jule Ronal CROME, PA-C  methocarbamol  (ROBAXIN ) 750 MG tablet Take 1 tablet (750 mg total) by mouth 3 (three) times daily as needed. Patient not taking: Reported on 04/16/2024 12/05/23   Jule Ronal CROME, PA-C  methylPREDNISolone  (MEDROL  DOSEPAK) 4 MG TBPK tablet Take as directed on package Patient not taking: Reported on 04/16/2024 12/05/23   Jule Ronal CROME, PA-C  ondansetron  (ZOFRAN ) 4 MG tablet Take 1 tablet (4 mg total) by mouth every 8 (eight) hours as needed for nausea or vomiting. Patient not taking: Reported on 04/16/2024 11/14/23   Jule Ronal CROME, PA-C  Prasterone  (INTRAROSA ) 6.5 MG INST Insert one capsule vaginally Patient not taking: Reported on 04/16/2024 10/24/19   Cris Burnard DEL, MD  lisinopril  (ZESTRIL ) 2.5 MG tablet Take 1 tablet (2.5 mg total) by mouth 2 (two) times daily. 09/12/19 04/22/20  Delbert Clam, MD     Positive ROS: All other systems have been reviewed and were otherwise negative with the exception of those mentioned in the HPI and as above.  Physical Exam: General: Alert, no acute distress Cardiovascular: No pedal edema Respiratory: No cyanosis, no use of accessory musculature GI: abdomen soft Skin: No lesions in the area of chief complaint Neurologic: Sensation intact distally Psychiatric: Patient is competent for consent with normal mood and affect Lymphatic: no lymphedema  MUSCULOSKELETAL: exam stable  Assessment: left knee osteoarthritis  Plan: Plan for Procedure(s): ARTHROPLASTY, KNEE, TOTAL  The risks benefits and alternatives were discussed with the  patient including but not limited to the risks of nonoperative treatment, versus surgical intervention including infection, bleeding, nerve injury,  blood clots, cardiopulmonary complications, morbidity, mortality, among others, and they were willing to proceed.   Ozell Cummins, MD 04/29/2024 7:11 AM

## 2024-04-29 NOTE — Anesthesia Postprocedure Evaluation (Signed)
 Anesthesia Post Note  Patient: Ann Foster  Procedure(s) Performed: ARTHROPLASTY, KNEE, TOTAL (Left: Knee)     Patient location during evaluation: PACU Anesthesia Type: Spinal Level of consciousness: awake and alert Pain management: pain level controlled Vital Signs Assessment: post-procedure vital signs reviewed and stable Respiratory status: spontaneous breathing, nonlabored ventilation and respiratory function stable Cardiovascular status: blood pressure returned to baseline and stable Postop Assessment: no apparent nausea or vomiting Anesthetic complications: no   No notable events documented.  Last Vitals:  Vitals:   04/29/24 1145 04/29/24 1200  BP: (!) 156/88 (!) 156/86  Pulse: 60 65  Resp: 17 14  Temp:    SpO2: 97% 98%    Last Pain:  Vitals:   04/29/24 1130  TempSrc:   PainSc: 0-No pain                 Butler Levander Pinal

## 2024-04-29 NOTE — Evaluation (Signed)
 Physical Therapy Brief Evaluation and Discharge Note Patient Details Name: Ann Foster MRN: [id number] DOB: [birth year] Today's Date: 04/29/2024   History of Present Illness  Pt is 58 year old presented to [facility] on  04/29/24 for lt TKR. PMH - Rt TKR, DM, arthritis  Clinical Impression  Pt doing very well on POD #0 with mobility and ex's. Expect continued progress. Will practice stairs in AM and should be ready for dc home from PT standpoint.        PT Assessment    Assistance Needed at Discharge       Equipment Recommendations None recommended by PT  Recommendations for Other Services       Precautions/Restrictions Precautions Precautions: Knee Recall of Precautions/Restrictions: Intact Restrictions Weight Bearing Restrictions Per Provider Order: Yes LLE Weight Bearing Per Provider Order: Weight bearing as tolerated        Mobility  Bed Mobility          Transfers Overall transfer level: Modified independent Equipment used: Rolling walker (2 wheels)                    Ambulation/Gait Ambulation/Gait assistance: Supervision Gait Distance (Feet): 225 Feet Assistive device: Rolling walker (2 wheels) Gait Pattern/deviations: Step-through pattern, Decreased stride length      Home Activity Instructions    Stairs            Modified Rankin (Stroke Patients Only)        Balance                          Pertinent Vitals/Pain   Pain Assessment Pain Assessment: Faces Faces Pain Scale: Hurts little more Pain Location: L knee Pain Descriptors / Indicators: Aching Pain Intervention(s): Monitored during session, Ice applied     Home Living   Living Arrangements: Children       Home Equipment: Agricultural Consultant (2 wheels);BSC/3in1        Prior Function        UE/LE Assessment               Communication   Communication Communication: No apparent difficulties     Cognition         General Comments       Exercises Total Joint Exercises Ankle Circles/Pumps: AROM, Both, 10 reps, Seated Quad Sets: AROM, Left, 5 reps, Supine Long Arc Quad: AROM, Left, 5 reps, Seated Knee Flexion: AROM, Left, 5 reps, Seated Goniometric ROM: 3-90   Assessment/Plan    PT Problem List Decreased strength;Decreased range of motion;Decreased mobility;Pain       PT Visit Diagnosis Other abnormalities of gait and mobility (R26.89);Pain    No Skilled PT     Co-evaluation                AMPAC 6 Clicks Help needed turning from your back to your side while in a flat bed without using bedrails?: None Help needed moving from lying on your back to sitting on the side of a flat bed without using bedrails?: None Help needed moving to and from a bed to a chair (including a wheelchair)?: None Help needed standing up from a chair using your arms (e.g., wheelchair or bedside chair)?: None Help needed to walk in hospital room?: A Little Help needed climbing 3-5 steps with a railing? : A Little 6 Click Score: 22      End of Session   Activity Tolerance: Patient tolerated treatment well  Patient left: in bed Nurse Communication: Mobility status PT Visit Diagnosis: Other abnormalities of gait and mobility (R26.89);Pain Pain - Right/Left: Left Pain - part of body: Knee     Time: 8370-8349 PT Time Calculation (min) (ACUTE ONLY): 21 min  Charges:   PT Evaluation $PT Eval Low Complexity: 1 Low      [facility] PT Acute Rehabilitation Services Office [phone]   [facility] ORN [facility]  04/29/2024, 5:43 PM

## 2024-04-29 NOTE — Anesthesia Procedure Notes (Signed)
 Spinal  Patient location during procedure: OR Start time: 04/29/2024 8:44 AM End time: 04/29/2024 8:49 AM Reason for block: surgical anesthesia Staffing Performed: anesthesiologist  Anesthesiologist: Cleotilde Butler Dade, MD Performed by: Cleotilde Butler Dade, MD Authorized by: Cleotilde Butler Dade, MD   Preanesthetic Checklist Completed: patient identified, IV checked, site marked, risks and benefits discussed, surgical consent, monitors and equipment checked, pre-op evaluation and timeout performed Spinal Block Patient position: sitting Prep: DuraPrep Patient monitoring: heart rate, cardiac monitor, continuous pulse ox and blood pressure Approach: midline Location: L3-4 Injection technique: single-shot Needle Needle type: Sprotte  Needle gauge: 24 G Needle length: 9 cm Assessment Sensory level: T4 Events: CSF return

## 2024-04-29 NOTE — Discharge Instructions (Addendum)
 Information on my medicine - ELIQUIS  (apixaban )  This medication education was reviewed with me or my healthcare representative as part of my discharge preparation.    Why was Eliquis  prescribed for you? Eliquis  was prescribed for you to reduce the risk of blood clots forming after orthopedic surgery.    What do You need to know about Eliquis ? Take your Eliquis  TWICE DAILY - one tablet in the morning and one tablet in the evening with or without food.  It would be best to take the dose about the same time each day.  If you have difficulty swallowing the tablet whole please discuss with your pharmacist how to take the medication safely.  Take Eliquis  exactly as prescribed by your doctor and DO NOT stop taking Eliquis  without talking to the doctor who prescribed the medication.  Stopping without other medication to take the place of Eliquis  may increase your risk of developing a clot.  After discharge, you should have regular check-up appointments with your healthcare provider that is prescribing your Eliquis .  What do you do if you miss a dose? If a dose of ELIQUIS  is not taken at the scheduled time, take it as soon as possible on the same day and twice-daily administration should be resumed.  The dose should not be doubled to make up for a missed dose.  Do not take more than one tablet of ELIQUIS  at the same time.  INSTRUCTIONS AFTER JOINT REPLACEMENT   Remove items at home which could result in a fall. This includes throw rugs or furniture in walking pathways ICE to the affected joint every three hours while awake for 30 minutes at a time, for at least the first 3-5 days, and then as needed for pain and swelling.  Continue to use ice for pain and swelling. You may notice swelling that will progress down to the foot and ankle.  This is normal after surgery.  Elevate your leg when you are not up walking on it.   Continue to use the breathing machine you got in the hospital (incentive  spirometer) which will help keep your temperature down.  It is common for your temperature to cycle up and down following surgery, especially at night when you are not up moving around and exerting yourself.  The breathing machine keeps your lungs expanded and your temperature down.   DIET:  As you were doing prior to hospitalization, we recommend a well-balanced diet.  DRESSING / WOUND CARE / SHOWERING  Keep the surgical dressing until follow up.  The dressing is water proof, so you can shower without any extra covering.  IF THE DRESSING FALLS OFF or the wound gets wet inside, change the dressing with sterile gauze.  Please use good hand washing techniques before changing the dressing.  Do not use any lotions or creams on the incision until instructed by your surgeon.    ACTIVITY  Increase activity slowly as tolerated, but follow the weight bearing instructions below.   No driving for 6 weeks or until further direction given by your physician.  You cannot drive while taking narcotics.  No lifting or carrying greater than 10 lbs. until further directed by your surgeon. Avoid periods of inactivity such as sitting longer than an hour when not asleep. This helps prevent blood clots.  You may return to work once you are authorized by your doctor.     WEIGHT BEARING   Weight bearing as tolerated with assist device (walker, cane, etc) as directed, use  it as long as suggested by your surgeon or therapist, typically at least 4-6 weeks.   EXERCISES  Results after joint replacement surgery are often greatly improved when you follow the exercise, range of motion and muscle strengthening exercises prescribed by your doctor. Safety measures are also important to protect the joint from further injury. Any time any of these exercises cause you to have increased pain or swelling, decrease what you are doing until you are comfortable again and then slowly increase them. If you have problems or questions,  call your caregiver or physical therapist for advice.   Rehabilitation is important following a joint replacement. After just a few days of immobilization, the muscles of the leg can become weakened and shrink (atrophy).  These exercises are designed to build up the tone and strength of the thigh and leg muscles and to improve motion. Often times heat used for twenty to thirty minutes before working out will loosen up your tissues and help with improving the range of motion but do not use heat for the first two weeks following surgery (sometimes heat can increase post-operative swelling).   These exercises can be done on a training (exercise) mat, on the floor, on a table or on a bed. Use whatever works the best and is most comfortable for you.    Use music or television while you are exercising so that the exercises are a pleasant break in your day. This will make your life better with the exercises acting as a break in your routine that you can look forward to.   Perform all exercises about fifteen times, three times per day or as directed.  You should exercise both the operative leg and the other leg as well.  Exercises include:   Quad Sets - Tighten up the muscle on the front of the thigh (Quad) and hold for 5-10 seconds.   Straight Leg Raises - With your knee straight (if you were given a brace, keep it on), lift the leg to 60 degrees, hold for 3 seconds, and slowly lower the leg.  Perform this exercise against resistance later as your leg gets stronger.  Leg Slides: Lying on your back, slowly slide your foot toward your buttocks, bending your knee up off the floor (only go as far as is comfortable). Then slowly slide your foot back down until your leg is flat on the floor again.  Angel Wings: Lying on your back spread your legs to the side as far apart as you can without causing discomfort.  Hamstring Strength:  Lying on your back, push your heel against the floor with your leg straight by  tightening up the muscles of your buttocks.  Repeat, but this time bend your knee to a comfortable angle, and push your heel against the floor.  You may put a pillow under the heel to make it more comfortable if necessary.   A rehabilitation program following joint replacement surgery can speed recovery and prevent re-injury in the future due to weakened muscles. Contact your doctor or a physical therapist for more information on knee rehabilitation.    CONSTIPATION  Constipation is defined medically as fewer than three stools per week and severe constipation as less than one stool per week.  Even if you have a regular bowel pattern at home, your normal regimen is likely to be disrupted due to multiple reasons following surgery.  Combination of anesthesia, postoperative narcotics, change in appetite and fluid intake all can affect your bowels.  YOU MUST use at least one of the following options; they are listed in order of increasing strength to get the job done.  They are all available over the counter, and you may need to use some, POSSIBLY even all of these options:    Drink plenty of fluids (prune juice may be helpful) and high fiber foods Colace 100 mg by mouth twice a day  Senokot for constipation as directed and as needed Dulcolax (bisacodyl), take with full glass of water  Miralax (polyethylene glycol) once or twice a day as needed.  If you have tried all these things and are unable to have a bowel movement in the first 3-4 days after surgery call either your surgeon or your primary doctor.    If you experience loose stools or diarrhea, hold the medications until you stool forms back up.  If your symptoms do not get better within 1 week or if they get worse, check with your doctor.  If you experience the worst abdominal pain ever or develop nausea or vomiting, please contact the office immediately for further recommendations for treatment.   ITCHING:  If you experience itching with  your medications, try taking only a single pain pill, or even half a pain pill at a time.  You can also use Benadryl  over the counter for itching or also to help with sleep.   TED HOSE STOCKINGS:  Use stockings on both legs until for at least 2 weeks or as directed by physician office. They may be removed at night for sleeping.  MEDICATIONS:  See your medication summary on the "After Visit Summary" that nursing will review with you.  You may have some home medications which will be placed on hold until you complete the course of blood thinner medication.  It is important for you to complete the blood thinner medication as prescribed.  PRECAUTIONS:  If you experience chest pain or shortness of breath - call 911 immediately for transfer to the hospital emergency department.   If you develop a fever greater that 101 F, purulent drainage from wound, increased redness or drainage from wound, foul odor from the wound/dressing, or calf pain - CONTACT YOUR SURGEON.                                                   FOLLOW-UP APPOINTMENTS:  If you do not already have a post-op appointment, please call the office for an appointment to be seen by your surgeon.  Guidelines for how soon to be seen are listed in your "After Visit Summary", but are typically between 1-4 weeks after surgery.  OTHER INSTRUCTIONS:   Knee Replacement:  Do not place pillow under knee, focus on keeping the knee straight while resting. CPM instructions: 0-90 degrees, 2 hours in the morning, 2 hours in the afternoon, and 2 hours in the evening. Place foam block, curve side up under heel at all times except when in CPM or when walking.  DO NOT modify, tear, cut, or change the foam block in any way.  POST-OPERATIVE OPIOID TAPER INSTRUCTIONS: It is important to wean off of your opioid medication as soon as possible. If you do not need pain medication after your surgery it is ok to stop day one. Opioids include: Codeine, Hydrocodone (Norco,  Vicodin), Oxycodone (Percocet, oxycontin ) and hydromorphone  amongst others.  Long term and  even short term use of opiods can cause: Increased pain response Dependence Constipation Depression Respiratory depression And more.  Withdrawal symptoms can include Flu like symptoms Nausea, vomiting And more Techniques to manage these symptoms Hydrate well Eat regular healthy meals Stay active Use relaxation techniques(deep breathing, meditating, yoga) Do Not substitute Alcohol to help with tapering If you have been on opioids for less than two weeks and do not have pain than it is ok to stop all together.  Plan to wean off of opioids This plan should start within one week post op of your joint replacement. Maintain the same interval or time between taking each dose and first decrease the dose.  Cut the total daily intake of opioids by one tablet each day Next start to increase the time between doses. The last dose that should be eliminated is the evening dose.   Per [facility] clinic policy, our goal is ensure optimal postoperative pain control with a multimodal pain management strategy. For all [facility] patients, our goal is to wean post-operative narcotic medications by 6 weeks post-operatively. If this is not possible due to utilization of pain medication prior to surgery, your [facility] doctor will support your acute post-operative pain control for the first 6 weeks postoperatively, with a plan to transition you back to your primary pain team following that. Maralee will work to ensure a therapist, occupational.   MAKE SURE YOU:  Understand these instructions.  Get help right away if you are not doing well or get worse.    Thank you for letting us  be a part of your medical care team.  It is a privilege we respect greatly.  We hope these instructions will help you stay on track for a fast and full recovery!         Important Safety Information A possible side effect of Eliquis  is  bleeding. You should call your healthcare provider right away if you experience any of the following: Bleeding from an injury or your nose that does not stop. Unusual colored urine (red or dark brown) or unusual colored stools (red or black). Unusual bruising for unknown reasons. A serious fall or if you hit your head (even if there is no bleeding).  Some medicines may interact with Eliquis  and might increase your risk of bleeding or clotting while on Eliquis . To help avoid this, consult your healthcare provider or pharmacist prior to using any new prescription or non-prescription medications, including herbals, vitamins, non-steroidal anti-inflammatory drugs (NSAIDs) and supplements.  This website has more information on Eliquis  (apixaban ): [email or web address] .[email or web address] dena

## 2024-04-30 ENCOUNTER — Other Ambulatory Visit (HOSPITAL_COMMUNITY): Payer: Self-pay

## 2024-04-30 ENCOUNTER — Encounter (HOSPITAL_COMMUNITY): Payer: Self-pay | Admitting: Orthopaedic Surgery

## 2024-04-30 ENCOUNTER — Other Ambulatory Visit: Payer: Self-pay | Admitting: Physician Assistant

## 2024-04-30 DIAGNOSIS — M1712 Unilateral primary osteoarthritis, left knee: Secondary | ICD-10-CM | POA: Diagnosis not present

## 2024-04-30 DIAGNOSIS — Z96652 Presence of left artificial knee joint: Secondary | ICD-10-CM

## 2024-04-30 LAB — GLUCOSE, CAPILLARY: Glucose-Capillary: 162 mg/dL — ABNORMAL HIGH (ref 70–99)

## 2024-04-30 MED ORDER — HYDROCODONE-ACETAMINOPHEN 7.5-325 MG PO TABS
1.0000 | ORAL_TABLET | Freq: Four times a day (QID) | ORAL | 0 refills | Status: DC | PRN
  Filled 2024-04-30: qty 40, 5d supply, fill #0

## 2024-04-30 NOTE — Progress Notes (Signed)
 Physical Therapy Treatment Patient Details Name: Ann Foster MRN: [id number] DOB: [birth year] Today's Date: 04/30/2024   History of Present Illness 58 y.o. female admitted 04/29/24 for same day elective L TKA. PMH includes R TKA, DM, arthritis    PT Comments  Pt progressing with mobility, able to transfer and ambulate mod indep with RW; tolerated stair training well. Pt preparing for d/c home today; all education reviewed, pt reports no further questions or concerns. Reports plan for OPPT. If to remain admitted, will continue to follow acutely.      If plan is discharge home, recommend the following: Assist for transportation;Assistance with cooking/housework   Can travel by private vehicle      Yes  Equipment Recommendations  None recommended by PT    Recommendations for Other Services       Precautions / Restrictions Precautions Precautions: Knee Recall of Precautions/Restrictions: Intact Restrictions Weight Bearing Restrictions Per Provider Order: Yes LLE Weight Bearing Per Provider Order: Weight bearing as tolerated     Mobility  Bed Mobility Overal bed mobility: Independent                  Transfers Overall transfer level: Modified independent Equipment used: Rolling walker (2 wheels)                    Ambulation/Gait Ambulation/Gait assistance: Modified independent (Device/Increase time) Gait Distance (Feet): 500 Feet Assistive device: Rolling walker (2 wheels) Gait Pattern/deviations: Step-through pattern, Decreased stride length, Antalgic, Trunk flexed Gait velocity: Decreased         Stairs Stairs: Yes Stairs assistance: Supervision Stair Management: One rail Right, Step to pattern, Forwards Number of Stairs: 6 General stair comments: cues for sequencing   Wheelchair Mobility     Tilt Bed    Modified Rankin (Stroke Patients Only)       Balance Overall balance assessment: Needs assistance Sitting-balance support: No  upper extremity supported, Feet supported Sitting balance-Leahy Scale: Good     Standing balance support: No upper extremity supported, During functional activity Standing balance-Leahy Scale: Fair Standing balance comment: can stand and take steps without UE support                            Communication Communication Communication: No apparent difficulties  Cognition Arousal: Alert Behavior During Therapy: WFL for tasks assessed/performed   PT - Cognitive impairments: No apparent impairments                         Following commands: Intact      Cueing Cueing Techniques: Verbal cues  Exercises Other Exercises Other Exercises: seated L knee ext and flexion with overpressure stretch    General Comments General comments (skin integrity, edema, etc.): reviewed educ re: role of acute PT, POC, activity recommendations, precautions, positioning, edema control, DVT prevention, importance of mobility, LE therex/AROM, DME use      Pertinent Vitals/Pain Pain Assessment Pain Assessment: Faces Faces Pain Scale: Hurts a little bit Pain Location: L knee Pain Descriptors / Indicators: Aching, Sore Pain Intervention(s): Monitored during session, Limited activity within patient's tolerance, Premedicated before session    Home Living                          Prior Function            PT Goals (current goals can now be found in  the care plan section) Progress towards PT goals: Progressing toward goals    Frequency    7X/week      PT Plan      Co-evaluation              AM-PAC PT 6 Clicks Mobility   Outcome Measure  Help needed turning from your back to your side while in a flat bed without using bedrails?: None Help needed moving from lying on your back to sitting on the side of a flat bed without using bedrails?: None Help needed moving to and from a bed to a chair (including a wheelchair)?: None Help needed standing up from a  chair using your arms (e.g., wheelchair or bedside chair)?: None Help needed to walk in hospital room?: None Help needed climbing 3-5 steps with a railing? : A Little 6 Click Score: 23    End of Session   Activity Tolerance: Patient tolerated treatment well Patient left: in bed;with call bell/phone within reach Nurse Communication: Mobility status PT Visit Diagnosis: Other abnormalities of gait and mobility (R26.89);Pain Pain - Right/Left: Left Pain - part of body: Knee     Time: 0732-0748 PT Time Calculation (min) (ACUTE ONLY): 16 min  Charges:    $Gait Training: 8-22 mins PT General Charges $$ ACUTE PT VISIT: 1 Visit                     Darice Almas, PT, DPT Acute Rehabilitation Services  Personal: Secure [employer] Rehab Office: [phone]  Darice LITTIE Almas 04/30/2024, 11:44 AM

## 2024-04-30 NOTE — Progress Notes (Addendum)
 Subjective: 1 Day Post-Op Procedure(s) (LRB): ARTHROPLASTY, KNEE, TOTAL (Left) Patient reports pain as mild.  Itching possibly from the oxycodone   Objective: Vital signs in last 24 hours: Temp:  [97.6 F (36.4 C)-99 F (37.2 C)] 98.8 F (37.1 C) (12/02 0724) Pulse Rate:  [60-87] 87 (12/02 0724) Resp:  [14-20] 17 (12/02 0724) BP: (120-160)/(58-98) 160/79 (12/02 0724) SpO2:  [94 %-100 %] 98 % (12/02 0724)  Intake/Output from previous day: 12/01 0701 - 12/02 0700 In: 1950 [P.O.:1200; I.V.:550; IV Piggyback:200] Out: 805 [Urine:800; Blood:5] Intake/Output this shift: No intake/output data recorded.  No results for input(s): HGB in the last 72 hours. No results for input(s): WBC, RBC, HCT, PLT in the last 72 hours. No results for input(s): NA, K, CL, CO2, BUN, CREATININE, GLUCOSE, CALCIUM  in the last 72 hours. No results for input(s): LABPT, INR in the last 72 hours.  Neurologically intact Neurovascular intact Sensation intact distally Intact pulses distally Dorsiflexion/Plantar flexion intact Incision: dressing C/D/I No cellulitis present Compartment soft   Assessment/Plan: 1 Day Post-Op Procedure(s) (LRB): ARTHROPLASTY, KNEE, TOTAL (Left) Advance diet Up with therapy D/C IV fluids Discharge home with home health once cleared by PT Itching from ? Oxy- will send norco to [facility] Eliquis  sent to [facility] I have put in order for outpatient PT WBAT LLE      Ronal LITTIE Grave 04/30/2024, 8:18 AM

## 2024-04-30 NOTE — Progress Notes (Signed)
Patient awaiting family for discharge home, Patient in no acute distress nor complaints of pain nor discomfort; incision on knee is clean, dry and intact; No c/o pain at this time. Room was checked and accounted for all patient's belongings; discharge instructions concerning her medications, incision care, follow up appointment and when to call the doctor as needed were all discussed with patient by RN and she expressed understanding on the instructions given.

## 2024-04-30 NOTE — Discharge Summary (Signed)
 Patient ID: Ann Foster MRN: [id number] DOB/AGE: 60-Aug-1967 60 y.o.  Admit date: 04/29/2024 Discharge date: 04/30/2024  Admission Diagnoses:  Principal Problem:   Primary osteoarthritis of left knee Active Problems:   Status post total left knee replacement   Discharge Diagnoses:  Same  Past Medical History:  Diagnosis Date   Ankle fracture 2016   Right   Aortic atherosclerosis    trace calcific atherosclerosis aortic arch per ct neck done 12-22-17   Bronchitis    Diabetes mellitus without complication (HCC)    GERD (gastroesophageal reflux disease)    Hypertension    OA (osteoarthritis) of shoulder    left shoulder, both knees arthritis   Osteoarthritis, knee    Sleep apnea    wears CPAP    Surgeries: Procedure(s): ARTHROPLASTY, KNEE, TOTAL on 04/29/2024   Consultants:   Discharged Condition: Improved  Hospital Course: Lyrick Worland is an 58 y.o. female who was admitted 04/29/2024 for operative treatment ofPrimary osteoarthritis of left knee. Patient has severe unremitting pain that affects sleep, daily activities, and work/hobbies. After pre-op clearance the patient was taken to the operating room on 04/29/2024 and underwent  Procedure(s): ARTHROPLASTY, KNEE, TOTAL.    Patient was given perioperative antibiotics:  Anti-infectives (From admission, onward)    Start     Dose/Rate Route Frequency Ordered Stop   04/29/24 1400  ceFAZolin  (ANCEF ) IVPB 2g/100 mL premix        2 g 200 mL/hr over 30 Minutes Intravenous Every 6 hours 04/29/24 1249 04/29/24 1938   04/29/24 0938  vancomycin  (VANCOCIN ) powder  Status:  Discontinued          As needed 04/29/24 0938 04/29/24 1039   04/29/24 0645  ceFAZolin  (ANCEF ) IVPB 2g/100 mL premix        2 g 200 mL/hr over 30 Minutes Intravenous On call to O.R. 04/29/24 9364 04/29/24 0916        Patient was given sequential compression devices, early ambulation, and chemoprophylaxis to prevent DVT.  Inpatient Morphine  Milligram  Equivalents Per Day 12/1 - 12/2   Values displayed are in units of MME/Day    Order Start / End Date Yesterday Today    oxyCODONE  (Oxy IR/ROXICODONE ) immediate release tablet 5 mg 12/1 - 12/1 0 of Unknown --    oxyCODONE  (ROXICODONE ) 5 MG/5ML solution 5 mg 12/1 - 12/1 0 of Unknown --      Group total: 0 of Unknown     oxyCODONE  (Oxy IR/ROXICODONE ) immediate release tablet 5 mg 12/1 - No end date 0 of 15 0 of 30    oxyCODONE  (Oxy IR/ROXICODONE ) immediate release tablet 10 mg 12/1 - No end date 30 of 30 15 of 60    HYDROmorphone  (DILAUDID ) injection 1 mg 12/1 - No end date 0 of 40 0 of 60    fentaNYL  (SUBLIMAZE ) 100 MCG/2ML injection 12/1 - 12/1 0 of Unknown --    HYDROmorphone  (DILAUDID ) injection 0.25-0.5 mg 12/1 - 12/1 0 of 40-80 --    fentaNYL  (SUBLIMAZE ) injection 50 mcg 12/1 - 12/1 15 of 15 --    Daily Totals  45 of Unknown (at least 140-180) 15 of 150    Calculation Errors     Order Type Date Details   fentaNYL  (SUBLIMAZE ) 100 MCG/2ML injection Ordered Dose -- Frequency type could not be determined   oxyCODONE  (Oxy IR/ROXICODONE ) immediate release tablet 5 mg Ordered Dose -- Insufficient frequency information   oxyCODONE  (ROXICODONE ) 5 MG/5ML solution 5 mg Ordered Dose -- Insufficient  frequency information            Patient benefited maximally from hospital stay and there were no complications.    Recent vital signs: Patient Vitals for the past 24 hrs:  BP Temp Temp src Pulse Resp SpO2  04/30/24 0724 (!) 160/79 98.8 F (37.1 C) Oral 87 17 98 %  04/30/24 0428 (!) 128/58 98.9 F (37.2 C) Oral 87 18 100 %  04/29/24 2258 (!) 146/83 99 F (37.2 C) Oral 80 18 99 %  04/29/24 1935 137/72 98.7 F (37.1 C) Oral 80 18 99 %  04/29/24 1654 (!) 146/76 98.4 F (36.9 C) Oral 76 20 100 %  04/29/24 1258 (!) 143/88 97.7 F (36.5 C) -- 69 20 100 %  04/29/24 1230 (!) 143/85 97.8 F (36.6 C) -- 71 17 97 %  04/29/24 1215 (!) 136/90 -- -- 64 16 97 %  04/29/24 1200 (!) 156/86 -- -- 65 14  98 %  04/29/24 1145 (!) 156/88 -- -- 60 17 97 %  04/29/24 1130 (!) 154/98 -- -- 67 17 97 %  04/29/24 1115 (!) 149/86 -- -- 68 17 99 %  04/29/24 1100 (!) 137/95 -- -- 74 19 96 %  04/29/24 1045 (!) 120/93 -- -- 82 19 94 %  04/29/24 1043 -- 97.6 F (36.4 C) -- -- -- --  04/29/24 0825 130/75 -- -- 73 19 98 %     Recent laboratory studies: No results for input(s): WBC, HGB, HCT, PLT, NA, K, CL, CO2, BUN, CREATININE, GLUCOSE, INR, CALCIUM  in the last 72 hours.  Invalid input(s): PT, 2   Discharge Medications:   Allergies as of 04/30/2024   No Known Allergies      Medication List     STOP taking these medications    Intrarosa  6.5 MG Inst Generic drug: Prasterone    methylPREDNISolone  4 MG Tbpk tablet Commonly known as: MEDROL  DOSEPAK   oxyCODONE -acetaminophen  5-325 MG tablet Commonly known as: Percocet       TAKE these medications    Accu-Chek Guide test strip Generic drug: glucose blood Check blood glucose level by fingerstick once per day. E11.65   Accu-Chek Guide w/Device Kit Check blood glucose level by fingerstick once per day. E11.65   Accu-Chek Softclix Lancets lancets Check blood glucose level by fingerstick once per day. E11.65   albuterol  108 (90 Base) MCG/ACT inhaler Commonly known as: VENTOLIN  HFA Inhale 2 puffs into the lungs every 6 (six) hours as needed for wheezing or shortness of breath.   apixaban  2.5 MG Tabs tablet Commonly known as: Eliquis  Take 1 tablet (2.5 mg total) by mouth 2 (two) times daily after surgery to prevent blood clots.   atorvastatin  80 MG tablet Commonly known as: LIPITOR Take 1 tablet (80 mg total) by mouth daily.   B-D UF III MINI PEN NEEDLES 31G X 5 MM Misc Generic drug: Insulin  Pen Needle Use as instructed. Inject into the skin once weekly   buPROPion  150 MG 12 hr tablet Commonly known as: WELLBUTRIN  SR TAKE 1 TABLET(150 MG) BY MOUTH TWICE DAILY   busPIRone  10 MG tablet Commonly known  as: BUSPAR  TAKE 1 TABLET(10 MG) BY MOUTH TWICE DAILY What changed: See the new instructions.   dapagliflozin  propanediol 5 MG Tabs tablet Commonly known as: Farxiga  Take 1 tablet (5 mg total) by mouth daily before breakfast.   docusate sodium  100 MG capsule Commonly known as: Colace Take 1 capsule (100 mg total) by mouth daily as needed.   docusate  sodium 100 MG capsule Commonly known as: Colace Take 1 capsule (100 mg total) by mouth daily as needed.   doxycycline  100 MG tablet Commonly known as: VIBRA -TABS Take 1 tablet (100 mg total) by mouth 2 (two) times daily. To be taken after surgery   estradiol  0.1 MG/GM vaginal cream Commonly known as: ESTRACE  Apply 1 gram per vagina every night for 2 weeks, then apply three times a week   HYDROcodone -acetaminophen  7.5-325 MG tablet Commonly known as: NORCO Take 1-2 tablets by mouth every 6 (six) hours as needed for moderate pain (pain score 4-6).   methocarbamol  750 MG tablet Commonly known as: ROBAXIN  Take 1 tablet (750 mg total) by mouth 3 (three) times daily as needed. What changed: Another medication with the same name was removed. Continue taking this medication, and follow the directions you see here.   montelukast  10 MG tablet Commonly known as: SINGULAIR  Take 1 tablet (10 mg total) by mouth at bedtime.   omeprazole  40 MG capsule Commonly known as: PRILOSEC Take 1 capsule (40 mg total) by mouth daily. What changed:  when to take this reasons to take this   ondansetron  4 MG tablet Commonly known as: Zofran  Take 1 tablet (4 mg total) by mouth every 8 (eight) hours as needed for nausea or vomiting. What changed: Another medication with the same name was removed. Continue taking this medication, and follow the directions you see here.   ramipril  2.5 MG capsule Commonly known as: ALTACE  Take 1 capsule (2.5 mg total) by mouth daily.   Trulicity  3 MG/0.5ML Soaj Generic drug: Dulaglutide  ADMINISTER 3 MG UNDER THE SKIN 1  TIME A WEEK AS DIRECTED               Durable Medical Equipment  (From admission, onward)           Start     Ordered   04/29/24 1250  DME Walker rolling  Once       Question Answer Comment  Walker: With 5 Inch Wheels   Patient needs a walker to treat with the following condition Status post left partial knee replacement      04/29/24 1249   04/29/24 1250  DME 3 n 1  Once        04/29/24 1249   04/29/24 1250  DME Bedside commode  Once       Question:  Patient needs a bedside commode to treat with the following condition  Answer:  Status post left partial knee replacement   04/29/24 1249            Diagnostic Studies: DG Knee Left Port Result Date: 04/29/2024 CLINICAL DATA:  Left knee arthroplasty. EXAM: PORTABLE LEFT KNEE - 1-2 VIEW COMPARISON:  02/22/2024 FINDINGS: Evidence of interval left total knee arthroplasty with prosthetic components intact and normally located. Fluid and air over the knee joint compatible recent surgery. Remainder of the exam is unchanged. Recommend correlation with findings at the time of the procedure. IMPRESSION: Interval left total knee arthroplasty. Electronically Signed   By: Toribio Agreste M.D.   On: 04/29/2024 16:16    Disposition: Discharge disposition: 01-Home or Self Care          Follow-up Information     Jule Ronal CROME, PA-C. Schedule an appointment as soon as possible for a visit in 2 week(s).   Specialty: Orthopedic Surgery Contact information: [address] [place]  [place] [address] [phone]  Signed: Ronal LITTIE Grave 04/30/2024, 8:22 AM

## 2024-05-01 ENCOUNTER — Other Ambulatory Visit: Payer: Self-pay | Admitting: Nurse Practitioner

## 2024-05-01 ENCOUNTER — Telehealth: Payer: Self-pay

## 2024-05-01 DIAGNOSIS — R6882 Decreased libido: Secondary | ICD-10-CM

## 2024-05-01 MED ORDER — BUSPIRONE HCL 10 MG PO TABS: 10.0000 mg | ORAL_TABLET | Freq: Two times a day (BID) | ORAL | 2 refills | Status: AC

## 2024-05-01 NOTE — Telephone Encounter (Signed)
Sent buspar. 

## 2024-05-01 NOTE — Transitions of Care (Post Inpatient/ED Visit) (Signed)
 05/01/2024  Name: Ann Foster MRN: [id number] DOB: 1966/03/17  Today's TOC FU Call Status: Today's TOC FU Call Status:: Successful TOC FU Call Completed TOC FU Call Complete Date: 05/01/24  Patient's Name and Date of Birth confirmed. Name, DOB  Transition Care Management Follow-up Telephone Call Date of Discharge: 04/30/24 Discharge Facility: [facility]) Type of Discharge: Inpatient Admission Primary Inpatient Discharge Diagnosis:: osteoarthritis of left knee, s/p left TKR How have you been since you were released from the hospital?: Same (She stated she is still experiencing a great deal of pain depsite taking the norco.  She noted that the pain she is experiencing after this knee replacement is worse that the pain after her right TKR) Any questions or concerns?: No  Items Reviewed: Did you receive and understand the discharge instructions provided?: Yes Medications obtained,verified, and reconciled?: Yes (Medications Reviewed) (She has all medications except the buspar .  She confirmed she has a glucmometer and she did not have any questions about the med regime) Any new allergies since your discharge?: No Dietary orders reviewed?: Yes Type of Diet Ordered:: heart healthy, low sodium.  - she said she does not have much of an appetite. Do you have support at home?: Yes People in Home [RPT]: alone Name of Support/Comfort Primary Source: she stated she receives help from her niece and her daughter.  Medications Reviewed Today: Medications Reviewed Today     Reviewed by Marvis Bradley, RN (Case Manager) on 05/01/24 at 1310  Med List Status: <None>   Medication Order Taking? Sig Documenting Provider Last Dose Status Informant  Accu-Chek Softclix Lancets lancets [id number]  Check blood glucose level by fingerstick once per day. E11.65 Fleming, Zelda W, NP  Active Self  albuterol  (VENTOLIN  HFA) 108 (90 Base) MCG/ACT inhaler [id number] No Inhale 2 puffs into the lungs every 6  (six) hours as needed for wheezing or shortness of breath. Theotis Haze ORN, NP Past Month Active Self  apixaban  (ELIQUIS ) 2.5 MG TABS tablet [id number]  Take 1 tablet (2.5 mg total) by mouth 2 (two) times daily after surgery to prevent blood clots. Jule Ronal CROME, PA-C  Active   atorvastatin  (LIPITOR) 80 MG tablet [id number] No Take 1 tablet (80 mg total) by mouth daily. Theotis Haze ORN, NP 04/29/2024  5:30 AM Active Self  Blood Glucose Monitoring Suppl (ACCU-CHEK GUIDE) w/Device KIT [id number]  Check blood glucose level by fingerstick once per day. E11.65 Delbert Clam, MD  Active Self  buPROPion  (WELLBUTRIN  SR) 150 MG 12 hr tablet [id number] No TAKE 1 TABLET(150 MG) BY MOUTH TWICE DAILY Ajewole, Christana, MD 04/29/2024  5:30 AM Active Self  busPIRone  (BUSPAR ) 10 MG tablet [id number]  TAKE 1 TABLET(10 MG) BY MOUTH TWICE DAILY  Patient taking differently: Take 10 mg by mouth daily.   Ajewole, Christana, MD  Active Self  dapagliflozin  propanediol (FARXIGA ) 5 MG TABS tablet [id number] No Take 1 tablet (5 mg total) by mouth daily before breakfast. Theotis Haze ORN, NP 04/27/2024 Active Self  docusate sodium  (COLACE) 100 MG capsule [id number] No Take 1 capsule (100 mg total) by mouth daily as needed.  Patient not taking: Reported on 04/16/2024   Jule Ronal CROME DEVONNA Not Taking Active Self           Med Note SOILA LYLE BROCKS   Tue Apr 16, 2024  9:54 AM)    docusate sodium  (COLACE) 100 MG capsule [id number]  Take 1 capsule (100 mg total) by mouth daily as needed. Jule Ronal CROME, PA-C  Active Self  doxycycline  (VIBRA -TABS) 100 MG tablet [id number]  Take 1 tablet (100 mg total) by mouth 2 (two) times daily. To be taken after surgery Jule Ronal CROME, PA-C  Active Self           Med Note SOILA LYLE BROCKS   Tue Apr 16, 2024  9:53 AM) For use post procedure  estradiol  (ESTRACE ) 0.1 MG/GM vaginal cream [id number] No Apply 1 gram per vagina every night for 2 weeks, then apply three times a week Ajewole,  Christana, MD Past Week Active Self  glucose blood (ACCU-CHEK GUIDE) test strip [id number]  Check blood glucose level by fingerstick once per day. E11.65 Danton Jon HERO, PA-C  Active Self  HYDROcodone -acetaminophen  (NORCO) 7.5-325 MG tablet [id number]  Take 1-2 tablets by mouth every 6 (six) hours as needed for moderate pain (pain score 4-6). Jule Ronal CROME, PA-C  Active   Insulin  Pen Needle (B-D UF III MINI PEN NEEDLES) 31G X 5 MM MISC [id number]  Use as instructed. Inject into the skin once weekly Fleming, Zelda W, NP  Active Self  Discontinued 10/29/19 1427 (Reorder)            Med Note DIANAH, RACHEL E   Wed Apr 22, 2020 11:09 AM) Taking once daily  methocarbamol  (ROBAXIN ) 750 MG tablet [id number]  Take 1 tablet (750 mg total) by mouth 3 (three) times daily as needed. Jule Ronal CROME, PA-C  Active Self           Med Note SOILA LYLE BROCKS   Tue Apr 16, 2024  9:53 AM) For use post procedure  montelukast  (SINGULAIR ) 10 MG tablet [id number] No Take 1 tablet (10 mg total) by mouth at bedtime. Danton Jon HERO, PA-C 04/27/2024 Active Self  omeprazole  (PRILOSEC) 40 MG capsule [id number] No Take 1 capsule (40 mg total) by mouth daily.  Patient taking differently: Take 40 mg by mouth daily as needed (acid reflux).   Theotis Haze ORN, NP 04/28/2024 Active Self  ondansetron  (ZOFRAN ) 4 MG tablet [id number]  Take 1 tablet (4 mg total) by mouth every 8 (eight) hours as needed for nausea or vomiting. Jule Ronal CROME, PA-C  Active Self  ramipril  (ALTACE ) 2.5 MG capsule [id number] No Take 1 capsule (2.5 mg total) by mouth daily. Theotis Haze ORN, NP 04/29/2024  5:30 AM Active Self  TRULICITY  3 MG/0.5ML EMMANUEL [id number] No ADMINISTER 3 MG UNDER THE SKIN 1 TIME A WEEK AS DIRECTED Newlin, Enobong, MD 04/15/2024 Active Self            Home Care and Equipment/Supplies: Were Home Health Services Ordered?: No (She will be going to outpatient PT) Any new equipment or medical supplies ordered?: Yes Name of  Medical supply agency?: She stated she received a CPM and ice machine - arranged by the surgeon Were you able to get the equipment/medical supplies?: Yes Do you have any questions related to the use of the equipment/supplies?: No  Functional Questionnaire: Do you need assistance with bathing/showering or dressing?: No Do you need assistance with meal preparation?: Yes Do you need assistance with eating?: No Do you have difficulty maintaining continence: No Do you need assistance with getting out of bed/getting out of a chair/moving?: Yes (ambulates with RW WBAT LLE) Do you have difficulty managing or taking your medications?: No  Follow up appointments reviewed: PCP Follow-up appointment confirmed?: Yes Date of PCP follow-up appointment?: 07/09/24 Follow-up Provider: Haze Theotis, NP Specialist Hospital Follow-up appointment confirmed?: Yes Date of Specialist follow-up appointment?: 05/14/24 Follow-Up  Specialty Provider:: orthopedic surgeon Do you need transportation to your follow-up appointment?: No (She will arrange her ride through [facility] transportation.) Do you understand care options if your condition(s) worsen?: Yes-patient verbalized understanding    SIGNATURE Slater Diesel, RN

## 2024-05-01 NOTE — Telephone Encounter (Signed)
 Ann Foster- She is requesting a refill of buspar .

## 2024-05-02 NOTE — Telephone Encounter (Signed)
 I called the patient and informed her that the refill for Buspar  was sent to her pharmacy.  She was very adult nurse.

## 2024-05-08 ENCOUNTER — Ambulatory Visit: Payer: Self-pay | Admitting: Rehabilitation

## 2024-05-10 NOTE — Therapy (Signed)
 OUTPATIENT PHYSICAL THERAPY LOWER EXTREMITY EVALUATION   Patient Name: Ann Foster MRN: [id number] DOB:May 12, 1966, 58 y.o., female Today's Date: 05/13/2024   END OF SESSION:  PT End of Session - 05/13/24 1413     Visit Number 1    Date for Recertification  07/08/24    PT Start Time 1402    PT Stop Time 1445    PT Time Calculation (min) 43 min          Past Medical History:  Diagnosis Date   Ankle fracture 2016   Right   Aortic atherosclerosis    trace calcific atherosclerosis aortic arch per ct neck done 12-22-17   Bronchitis    Diabetes mellitus without complication (HCC)    GERD (gastroesophageal reflux disease)    Hypertension    OA (osteoarthritis) of shoulder    left shoulder, both knees arthritis   Osteoarthritis, knee    Sleep apnea    wears CPAP   Past Surgical History:  Procedure Laterality Date   CHOLECYSTECTOMY N/A 04/15/2023   Procedure: LAPAROSCOPIC CHOLECYSTECTOMY WITH ICG DYE;  Surgeon: Ebbie Cough, MD;  Location: [facility];  Service: General;  Laterality: N/A;   COLONOSCOPY     TOTAL KNEE ARTHROPLASTY Right 11/20/2023   Procedure: ARTHROPLASTY, KNEE, TOTAL RIGHT;  Surgeon: Jerri Kay HERO, MD;  Location: [facility];  Service: Orthopedics;  Laterality: Right;   TOTAL KNEE ARTHROPLASTY Left 04/29/2024   Procedure: ARTHROPLASTY, KNEE, TOTAL;  Surgeon: Jerri Kay HERO, MD;  Location: [facility];  Service: Orthopedics;  Laterality: Left;   Patient Active Problem List   Diagnosis Date Noted   Status post total left knee replacement 04/29/2024   Primary osteoarthritis of left knee 02/22/2024   Status post total right knee replacement 11/20/2023   Snoring 06/07/2023   Acute cholecystitis 04/13/2023   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, with long-term current use of insulin  (HCC) 04/25/2022   Amblyopia of eye, right 07/27/2021   Corneal guttata of left eye 07/27/2021   Influenza vaccine  refused 07/29/2020   COVID-19 vaccine dose declined 07/29/2020   Vasomotor symptoms due to menopause 04/24/2020   Atrophic vaginitis 04/24/2020   Impingement syndrome of left shoulder 01/23/2018    PCP: Theotis Haze ORN, NP   REFERRING PROVIDER: Jule Ronal CROME, PA-C   REFERRING DIAG: [phone] (ICD-10-CM) - Hx of total knee replacement, left  THERAPY DIAG:  Acute pain of right knee  Stiffness of right knee, not elsewhere classified  Difficulty in walking, not elsewhere classified  Muscle weakness (generalized)  Localized edema  RATIONALE FOR EVALUATION AND TREATMENT: Rehabilitation  ONSET DATE: 04/30/24  NEXT MD VISIT: 05/13/24   SUBJECTIVE:  SUBJECTIVE STATEMENT: 58 y/o patient referred to PT for L TKA by Dr Jerri on 04/30/24.  She is well known to us  from recent R TKA.   She is 2 weeks post op tomorrow.   Unclear why it took so long for her to get into see us .   She did not have any HH PT.   She reports she has been doing some of her home exercises from her R TKA on her own at home.  She has an Aquacel bandage in place over the L knee incision that is going to be removed/changed tomorrow when she f/u with Dr Jerri.  The bandage is loose, but it has not loosened to the inner square yet so the incision is not exposed.   Patient is advised that if it does come loose exposing the incision that she is call MD office and report.   She is also recommended not to shower or get the bandage wet until after MD appointment tomorrow, since it is just barely intact.     PAIN: Are you having pain? Yes: NPRS scale: 8/10 Pain location: L knee Pain description: aching/sore always,  sharp at times Aggravating factors: prolonged walking, end range stretching into flexion/extension Relieving factors: rest,  ice, muscle relaxers  PERTINENT HISTORY:  DM, HTN, bronchitis, R TKA  PRECAUTIONS: None  RED FLAGS: None  WEIGHT BEARING RESTRICTIONS: No  FALLS:  Has patient fallen in last 6 months? No  LIVING ENVIRONMENT: Lives with: lives with their family Lives in: House/apartment Stairs: Yes: External: 1 steps; none Has following equipment at home: Single point cane, Walker - 2 wheeled, and bed side commode  OCCUPATION: retired  PLOF: Independent with gait  PATIENT GOALS: get back 100%    OBJECTIVE: (objective measures completed at initial evaluation unless otherwise dated)  DIAGNOSTIC FINDINGS:  N/A  PATIENT SURVEYS:  LEFS  Extreme difficulty/unable (0), Quite a bit of difficulty (1), Moderate difficulty (2), Little difficulty (3), No difficulty (4) Survey date:  05/13/24  Any of your usual work, housework or school activities 1  2. Usual hobbies, recreational or sporting activities 1  3. Getting into/out of the bath 3  4. Walking between rooms 3  5. Putting on socks/shoes 3  6. Squatting  1  7. Lifting an object, like a bag of groceries from the floor 1  8. Performing light activities around your home 2  9. Performing heavy activities around your home 1  10. Getting into/out of a car 2  11. Walking 2 blocks 1  12. Walking 1 mile 0  13. Going up/down 10 stairs (1 flight) 2  14. Standing for 1 hour 3  15.  sitting for 1 hour 3  16. Running on even ground 0  17. Running on uneven ground 0  18. Making sharp turns while running fast 0  19. Hopping  0  20. Rolling over in bed 3  Score total:  30/80     COGNITION: Overall cognitive status: Within functional limits for tasks assessed    SENSATION: WFL  EDEMA:  Circumferential: L knee = 48.5 cm;  R knee = 44.0 cm  POSTURE:  No Significant postural limitations  PALPATION: Tender to palpate around the L knee in general   LOWER EXTREMITY ROM:  Active ROM Right eval Left eval  Hip flexion    Hip extension     Hip abduction    Hip adduction    Hip internal rotation    Hip external rotation  Knee flexion    Knee extension    Ankle dorsiflexion    Ankle plantarflexion    Ankle inversion    Ankle eversion     Passive ROM Right eval Left eval  Hip flexion    Hip extension    Hip abduction    Hip adduction    Hip internal rotation    Hip external rotation    Knee flexion 90 85  Knee extension 0 -6  Ankle dorsiflexion    Ankle plantarflexion    Ankle inversion    Ankle eversion    (Blank rows = not tested)  LOWER EXTREMITY MMT:  MMT Right eval Left eval  Hip flexion 5 4-  Hip extension    Hip abduction 4+ 3+  Hip adduction    Hip internal rotation 4+ 4-  Hip external rotation 5 4  Knee flexion 5 4  Knee extension 5 3+  Ankle dorsiflexion 5 4+  Ankle plantarflexion    Ankle inversion    Ankle eversion     (Blank rows = not tested)  LOWER EXTREMITY SPECIAL TESTS:  N/a  FUNCTIONAL TESTS:  5X STS = 26.49 sec TUG = 25.61 sec  GAIT: Distance walked: into clinic x 150' Assistive device utilized: [employer] - 2 wheeled Level of assistance: SBA Gait pattern: decreased step length- Left and decreased stance time- Left Comments: antalgic, decreased knee flexion in swing, decreased TKE at heel strike   TODAY'S TREATMENT:  05/13/24 SELF CARE: Provided education on PT POC progression and on post-surgical precautions.initial HEP  MODALITIES: Vasopneumatic ice x 13' to L knee at minimal compression x 34 degrees  PATIENT EDUCATION:  Education details: PT eval findings, anticipated POC, and initial HEP  Person educated: Patient Education method: Explanation, Demonstration, Verbal cues, Tactile cues, Handouts, and MedBridgeGO app access provided Education comprehension: verbalized understanding, verbal cues required, tactile cues required, and needs further education  HOME EXERCISE PROGRAM: Access Code: [email or web address] URL: [email or web address] Date:  05/13/2024 Prepared by: Garnette Montclair  Exercises - Supine Ankle Pumps  - 1 x daily - 7 x weekly - 3 sets - 10 reps - Supine Quad Set  - 1 x daily - 7 x weekly - 3 sets - 10 reps - Supine Heel Slides  - 1 x daily - 7 x weekly - 3 sets - 10 reps - Supine Short Arc Quad  - 1 x daily - 7 x weekly - 3 sets - 10 reps - Supine Hip Abduction  - 1 x daily - 7 x weekly - 1-2 sets - 10 reps - Supine Straight Leg Raises  - 1 x daily - 7 x weekly - 1-2 sets - 10 reps - Standing Ankle Dorsiflexion with Table Support  - 1 x daily - 7 x weekly - 3 sets - 10 reps - Standing Heel Raises  - 1 x daily - 7 x weekly - 3 sets - 10 reps - Standing March with Counter Support  - 1 x daily - 7 x weekly - 3 sets - 10 reps - Standing Hip Abduction with Counter Support  - 1 x daily - 7 x weekly - 3 sets - 10 reps - Standing Hip Extension with Counter Support  - 1 x daily - 7 x weekly - 3 sets - 10 reps - Standing Knee Flexion with Counter Support  - 1 x daily - 7 x weekly - 3 sets - 10 reps - Seated Knee Extension Stretch with Chair  - 1 x daily -  7 x weekly - 1 sets - 3 reps - 3-5 min hold - Seated Knee Flexion Stretch  - 1 x daily - 7 x weekly - 1 sets - 2-3 reps - 1 min hold   ASSESSMENT:  CLINICAL IMPRESSION: Aerianna Losey is a 58 y.o. female who was referred to physical therapy for evaluation and treatment for L TKA.  She is well known to us  from recent R TKA.  Surgery was 12/2, so she is 2 weeks post op.   Surprisingly she is not as stiff as we would have expected for 2 weeks without PT.   ROM is -5 to 85 degrees.    Pain is worse with end range flexion or extension of the L knee and with prolonged ambulation.  Patient has deficits in L knee ROM, LLE strength, walking,balance, edema, and pain which are interfering with ADLs and are impacting quality of life.  On LEFS patient scored 30/80 demonstrating moderate functional limitation.  Leira will benefit from skilled PT to address above deficits to improve  mobility and activity tolerance with decreased pain interference.   ---OF NOTE--the patient began to c/o calf pain at the end of vasopneumatic treatment today.   She relates the pain is posterolateral at the popliteal fossa and just distal to the fibular head on the upper lateral calf.   She states she has been having this pain since surgery.  However, she reports her eliquis  was D/C early due to bruising so she has not been taking it.   Contacted Dr Benjiman office and informed them of the above information.  Patient will f/u with their office tomorrow for bandage change/removal and is advised to discuss this pain with them.  She is advised to call 911 if she experiences any chest pain, SOB, diaphoresis, etc.    OBJECTIVE IMPAIRMENTS: difficulty walking, decreased ROM, decreased strength, increased edema, and pain.   ACTIVITY LIMITATIONS: carrying, lifting, bending, standing, squatting, stairs, and locomotion level  PARTICIPATION LIMITATIONS: meal prep, cleaning, laundry, driving, shopping, and community activity  PERSONAL FACTORS: Age and 1-2 comorbidities: DM, HTN, bronchitis, R TKA are also affecting patient's functional outcome.   REHAB POTENTIAL: Good  CLINICAL DECISION MAKING: Evolving/moderate complexity  EVALUATION COMPLEXITY: Moderate   GOALS: Goals reviewed with patient? Yes  SHORT TERM GOALS: Target date: 06/10/2024   Patient will be independent with initial HEP. Baseline: 100% PT assist required for correct completion Goal status: INITIAL  2.  Patient will report at least 25% improvement in L knee pain to improve QOL. Baseline: 8/10 worst Goal status: INITIAL  3.  Patient will ambulate with a SPC or no device indoors on level ground independently with good balance Baseline: walker dependent with SBA  Goal status: INITIAL   LONG TERM GOALS: Target date: 07/08/2024   Patient will be independent with advanced/ongoing HEP to improve outcomes and carryover.  Baseline: no  advanced HEP yet Goal status: INITIAL  2.  Patient will report at least 50-75% improvement in L knee pain to improve QOL. Baseline: 8/10 worst Goal status: INITIAL  3.  Patient will demonstrate improved L knee AROM to >/= 0-130 deg to allow for normal gait and stair mechanics. Baseline: Refer to above LE ROM table Goal status: INITIAL  4.  Patient will demonstrate improved LLE strength to >/= 5/5 for improved stability and ease of mobility. Baseline: Refer to above LE MMT table Goal status: INITIAL  5.  Patient will be able to ambulate 77' with no device and  normal gait pattern without increased pain to access community.  Baseline: 5 min Goal status: INITIAL  6. Patient will be able to ascend/descend stairs with 1 HR and reciprocal step pattern safely to access home and community.  Baseline: 1 step at a time holding rail leading with RLE only going up/LLE only going down Goal status: INITIAL  7.  Patient will report >/= 50/80 on LEFS (MCID = 9 pts) to demonstrate improved functional ability. Baseline: 30/80 Goal status: INITIAL  8.  Patient will demonstrate at least 19/24 on DGI to decrease risk of falls. Baseline: TBD Goal status: INITIAL   9.  Patient will improve on TUG test to </= 14 sec to reduce fall risk Baseline: 25.61 sec Goal status: INITIAL  10.  Patient will improve on 5X STS to </= to 12 sec to demonstrate improved glut/quad strength for independence with transfers from low seating surfaces   PLAN:  PT FREQUENCY: 1-2x/week  PT DURATION: 8 weeks  PLANNED INTERVENTIONS: 97750- Physical Performance Testing, 97110-Therapeutic exercises, 97530- Therapeutic activity, 97112- Neuromuscular re-education, 97535- Self Care, 02859- Manual therapy, G0283- Electrical stimulation (unattended), 97016- Vasopneumatic device, 20560 (1-2 muscles), 20561 (3+ muscles)- Dry Needling, Patient/Family education, Taping, Joint mobilization, Cryotherapy, and Moist heat  PLAN FOR NEXT  SESSION: progress ROM, strength, gait, balance, control edema with vasopneumatic ice   Fontaine Hehl, PT 05/13/2024, 5:12 PM

## 2024-05-13 ENCOUNTER — Encounter: Payer: Self-pay | Admitting: Rehabilitation

## 2024-05-13 ENCOUNTER — Telehealth: Payer: Self-pay | Admitting: Orthopaedic Surgery

## 2024-05-13 ENCOUNTER — Other Ambulatory Visit: Payer: Self-pay

## 2024-05-13 ENCOUNTER — Ambulatory Visit: Admitting: Rehabilitation

## 2024-05-13 DIAGNOSIS — Z96652 Presence of left artificial knee joint: Secondary | ICD-10-CM | POA: Insufficient documentation

## 2024-05-13 DIAGNOSIS — M6281 Muscle weakness (generalized): Secondary | ICD-10-CM

## 2024-05-13 DIAGNOSIS — M25661 Stiffness of right knee, not elsewhere classified: Secondary | ICD-10-CM

## 2024-05-13 DIAGNOSIS — M25561 Pain in right knee: Secondary | ICD-10-CM | POA: Diagnosis present

## 2024-05-13 DIAGNOSIS — R262 Difficulty in walking, not elsewhere classified: Secondary | ICD-10-CM

## 2024-05-13 DIAGNOSIS — R600 Localized edema: Secondary | ICD-10-CM | POA: Insufficient documentation

## 2024-05-13 NOTE — Telephone Encounter (Signed)
 Elspeth from [facility] called and said the patient isn't taking her blood thinners and she is complaining about her calf pain. CB#[phone]

## 2024-05-14 ENCOUNTER — Ambulatory Visit (HOSPITAL_COMMUNITY)
Admission: RE | Admit: 2024-05-14 | Discharge: 2024-05-14 | Disposition: A | Source: Ambulatory Visit | Attending: Orthopaedic Surgery | Admitting: Orthopaedic Surgery

## 2024-05-14 ENCOUNTER — Ambulatory Visit: Admitting: Orthopaedic Surgery

## 2024-05-14 ENCOUNTER — Ambulatory Visit: Admitting: Physician Assistant

## 2024-05-14 DIAGNOSIS — Z96652 Presence of left artificial knee joint: Secondary | ICD-10-CM | POA: Diagnosis present

## 2024-05-14 MED ORDER — HYDROCODONE-ACETAMINOPHEN 7.5-325 MG PO TABS: 1.0000 | ORAL_TABLET | Freq: Four times a day (QID) | ORAL | 0 refills | Status: AC | PRN

## 2024-05-14 NOTE — Progress Notes (Signed)
 Post-Op Visit Note   Patient: Ann Foster           Date of Birth: Nov 15, 1965           MRN: [id number] Visit Date: 05/14/2024 PCP: Theotis Haze ORN, NP   Assessment & Plan:  Chief Complaint:  Chief Complaint  Patient presents with   Left Knee - Routine Post Op    L TKA-04/29/2024   Visit Diagnoses:  1. Status post total left knee replacement     Plan: Patient is a pleasant 58 year old female who comes in today 2 weeks status post left total knee replacement.  She has been doing well but has had increased pain to her left calf over the past several days.  She has been taking Norco and Tylenol  as needed.  She has been compliant taking Eliquis  twice daily for DVT prophylaxis.  She did not get any home health PT but started outpatient physical therapy yesterday and is ambulating with a walker.  Examination of her left knee reveals a well-healing surgical incision with nylon sutures in place.  No evidence of infection or cellulitis.  She has moderate tenderness to the lateral aspect of the proximal calf.  Negative Homans.  She is neurovascular intact distally.  Today, sutures were removed and Steri-Strips applied.  I have made a referral for venous ultrasound left lower extremity to rule out DVT.  So long as she is not positive, she will continue with her Eliquis  twice daily for 2 weeks and then transition to a baby aspirin twice daily for 2 weeks.  I have refilled her Norco.  Follow-up in 4 weeks for repeat evaluation and 2 view x-rays of the left knee.  Call with concerns or questions.  Follow-Up Instructions: Return in about 4 weeks (around 06/11/2024).   Orders:  No orders of the defined types were placed in this encounter.  Meds ordered this encounter  Medications   HYDROcodone -acetaminophen  (NORCO) 7.5-325 MG tablet    Sig: Take 1-2 tablets by mouth every 6 (six) hours as needed for moderate pain (pain score 4-6).    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, with long-term current use of insulin  (HCC) 04/25/2022   Amblyopia of eye, right 07/27/2021   Corneal guttata of left eye 07/27/2021   Influenza vaccine refused 07/29/2020   COVID-19 vaccine dose declined 07/29/2020   Vasomotor symptoms due to menopause 04/24/2020   Atrophic vaginitis 04/24/2020   Impingement syndrome of left shoulder 01/23/2018   Past Medical History:  Diagnosis Date   Ankle fracture 2016   Right   Aortic atherosclerosis    trace calcific atherosclerosis aortic arch per ct neck done 12-22-17   Bronchitis    Diabetes mellitus without complication (HCC)    GERD (gastroesophageal reflux disease)    Hypertension    OA (osteoarthritis) of shoulder    left shoulder, both knees arthritis   Osteoarthritis, knee    Sleep apnea    wears CPAP    Family History  Problem Relation Age of Onset   Breast cancer Mother  Colon cancer Father 46   Diabetes Son    Esophageal cancer Neg Hx    Stomach cancer Neg Hx    Liver disease Neg Hx    Pancreatic cancer Neg Hx    Rectal cancer Neg Hx     Past Surgical History:  Procedure Laterality Date   CHOLECYSTECTOMY N/A 04/15/2023   Procedure: LAPAROSCOPIC CHOLECYSTECTOMY WITH ICG DYE;  Surgeon: Ebbie Cough, MD;  Location: [facility];  Service: General;  Laterality: N/A;   COLONOSCOPY     TOTAL KNEE ARTHROPLASTY Right 11/20/2023   Procedure: ARTHROPLASTY, KNEE, TOTAL RIGHT;  Surgeon: Jerri Kay HERO, MD;  Location: [facility];  Service: Orthopedics;  Laterality: Right;   TOTAL KNEE ARTHROPLASTY Left 04/29/2024   Procedure: ARTHROPLASTY, KNEE, TOTAL;  Surgeon:  Jerri Kay HERO, MD;  Location: [facility];  Service: Orthopedics;  Laterality: Left;   Social History   Occupational History   Not on file  Tobacco Use   Smoking status: Every Day    Current packs/day: 0.00    Average packs/day: 0.3 packs/day    Types: Cigarettes    Last attempt to quit: 11/28/2019    Years since quitting: 4.4   Smokeless tobacco: Never  Vaping Use   Vaping status: Never Used  Substance and Sexual Activity   Alcohol use: Not Currently   Drug use: No   Sexual activity: Not Currently    Birth control/protection: Condom

## 2024-05-14 NOTE — Addendum Note (Signed)
 Addended by: Eamonn Sermeno on: 05/14/2024 11:01 AM   Modules accepted: Orders

## 2024-05-15 ENCOUNTER — Ambulatory Visit

## 2024-05-15 DIAGNOSIS — M25561 Pain in right knee: Secondary | ICD-10-CM

## 2024-05-15 DIAGNOSIS — M6281 Muscle weakness (generalized): Secondary | ICD-10-CM

## 2024-05-15 DIAGNOSIS — M25661 Stiffness of right knee, not elsewhere classified: Secondary | ICD-10-CM

## 2024-05-15 DIAGNOSIS — R262 Difficulty in walking, not elsewhere classified: Secondary | ICD-10-CM

## 2024-05-15 DIAGNOSIS — R600 Localized edema: Secondary | ICD-10-CM

## 2024-05-15 NOTE — Therapy (Signed)
 OUTPATIENT PHYSICAL THERAPY LOWER EXTREMITY TREATMENT   Patient Name: Ann Foster MRN: [id number] DOB:12/21/1965, 58 y.o., female Today's Date: 05/15/2024   END OF SESSION:  PT End of Session - 05/15/24 1002     Visit Number 2    Date for Recertification  07/08/24    PT Start Time 0957    PT Stop Time 1056    PT Time Calculation (min) 59 min           Past Medical History:  Diagnosis Date   Ankle fracture 2016   Right   Aortic atherosclerosis    trace calcific atherosclerosis aortic arch per ct neck done 12-22-17   Bronchitis    Diabetes mellitus without complication (HCC)    GERD (gastroesophageal reflux disease)    Hypertension    OA (osteoarthritis) of shoulder    left shoulder, both knees arthritis   Osteoarthritis, knee    Sleep apnea    wears CPAP   Past Surgical History:  Procedure Laterality Date   CHOLECYSTECTOMY N/A 04/15/2023   Procedure: LAPAROSCOPIC CHOLECYSTECTOMY WITH ICG DYE;  Surgeon: Ann Cough, MD;  Location: [facility];  Service: General;  Laterality: N/A;   COLONOSCOPY     TOTAL KNEE ARTHROPLASTY Right 11/20/2023   Procedure: ARTHROPLASTY, KNEE, TOTAL RIGHT;  Surgeon: Ann Kay HERO, MD;  Location: [facility];  Service: Orthopedics;  Laterality: Right;   TOTAL KNEE ARTHROPLASTY Left 04/29/2024   Procedure: ARTHROPLASTY, KNEE, TOTAL;  Surgeon: Ann Kay HERO, MD;  Location: [facility];  Service: Orthopedics;  Laterality: Left;   Patient Active Problem List   Diagnosis Date Noted   Status post total left knee replacement 04/29/2024   Primary osteoarthritis of left knee 02/22/2024   Status post total right knee replacement 11/20/2023   Snoring 06/07/2023   Acute cholecystitis 04/13/2023   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, with long-term current use of insulin  (HCC) 04/25/2022   Amblyopia of eye, right 07/27/2021   Corneal guttata of left eye 07/27/2021   Influenza vaccine  refused 07/29/2020   COVID-19 vaccine dose declined 07/29/2020   Vasomotor symptoms due to menopause 04/24/2020   Atrophic vaginitis 04/24/2020   Impingement syndrome of left shoulder 01/23/2018    PCP: Ann Foster ORN, NP   REFERRING PROVIDER: Jule Ronal CROME, PA-C   REFERRING DIAG: [phone] (ICD-10-CM) - Hx of total knee replacement, left  THERAPY DIAG:  Acute pain of right knee  Stiffness of right knee, not elsewhere classified  Difficulty in walking, not elsewhere classified  Muscle weakness (generalized)  Localized edema  RATIONALE FOR EVALUATION AND TREATMENT: Rehabilitation  ONSET DATE: 04/30/24  NEXT MD VISIT: 05/13/24   SUBJECTIVE:  SUBJECTIVE STATEMENT: Pt reports pain today, just took medicine before coming in, bandages have been removed, no DVT per US  yesteday   58 y/o patient referred to PT for L TKA by Dr Ann on 04/30/24.  She is well known to us  from recent R TKA.   She is 2 weeks post op tomorrow.   Unclear why it took so long for her to get into see us .   She did not have any HH PT.   She reports she has been doing some of her home exercises from her R TKA on her own at home.  She has an Aquacel bandage in place over the L knee incision that is going to be removed/changed tomorrow when she f/u with Dr Ann.  The bandage is loose, but it has not loosened to the inner square yet so the incision is not exposed.   Patient is advised that if it does come loose exposing the incision that she is call MD office and report.   She is also recommended not to shower or get the bandage wet until after MD appointment tomorrow, since it is just barely intact.     PAIN: Are you having pain? Yes: NPRS scale: 8/10 Pain location: L knee Pain description: aching/sore always,  sharp at  times Aggravating factors: prolonged walking, end range stretching into flexion/extension Relieving factors: rest, ice, muscle relaxers  PERTINENT HISTORY:  DM, HTN, bronchitis, R TKA  PRECAUTIONS: None  RED FLAGS: None  WEIGHT BEARING RESTRICTIONS: No  FALLS:  Has patient fallen in last 6 months? No  LIVING ENVIRONMENT: Lives with: lives with their family Lives in: House/apartment Stairs: Yes: External: 1 steps; none Has following equipment at home: Single point cane, Walker - 2 wheeled, and bed side commode  OCCUPATION: retired  PLOF: Independent with gait  PATIENT GOALS: get back 100%    OBJECTIVE: (objective measures completed at initial evaluation unless otherwise dated)  DIAGNOSTIC FINDINGS:  N/A  PATIENT SURVEYS:  LEFS  Extreme difficulty/unable (0), Quite a bit of difficulty (1), Moderate difficulty (2), Little difficulty (3), No difficulty (4) Survey date:  05/13/24  Any of your usual work, housework or school activities 1  2. Usual hobbies, recreational or sporting activities 1  3. Getting into/out of the bath 3  4. Walking between rooms 3  5. Putting on socks/shoes 3  6. Squatting  1  7. Lifting an object, like a bag of groceries from the floor 1  8. Performing light activities around your home 2  9. Performing heavy activities around your home 1  10. Getting into/out of a car 2  11. Walking 2 blocks 1  12. Walking 1 mile 0  13. Going up/down 10 stairs (1 flight) 2  14. Standing for 1 hour 3  15.  sitting for 1 hour 3  16. Running on even ground 0  17. Running on uneven ground 0  18. Making sharp turns while running fast 0  19. Hopping  0  20. Rolling over in bed 3  Score total:  30/80     COGNITION: Overall cognitive status: Within functional limits for tasks assessed    SENSATION: WFL  EDEMA:  Circumferential: L knee = 48.5 cm;  R knee = 44.0 cm  POSTURE:  No Significant postural limitations  PALPATION: Tender to palpate around  the L knee in general   LOWER EXTREMITY ROM:  Active ROM Right eval Left eval  Hip flexion    Hip extension  Hip abduction    Hip adduction    Hip internal rotation    Hip external rotation    Knee flexion    Knee extension    Ankle dorsiflexion    Ankle plantarflexion    Ankle inversion    Ankle eversion     Passive ROM Right eval Left eval 05/15/24  Hip flexion     Hip extension     Hip abduction     Hip adduction     Hip internal rotation     Hip external rotation     Knee flexion 90 85 98  Knee extension 0 -6 6  Ankle dorsiflexion     Ankle plantarflexion     Ankle inversion     Ankle eversion     (Blank rows = not tested)  LOWER EXTREMITY MMT:  MMT Right eval Left eval  Hip flexion 5 4-  Hip extension    Hip abduction 4+ 3+  Hip adduction    Hip internal rotation 4+ 4-  Hip external rotation 5 4  Knee flexion 5 4  Knee extension 5 3+  Ankle dorsiflexion 5 4+  Ankle plantarflexion    Ankle inversion    Ankle eversion     (Blank rows = not tested)  LOWER EXTREMITY SPECIAL TESTS:  N/a  FUNCTIONAL TESTS:  5X STS = 26.49 sec TUG = 25.61 sec  GAIT: Distance walked: into clinic x 150' Assistive device utilized: [employer] - 2 wheeled Level of assistance: SBA Gait pattern: decreased step length- Left and decreased stance time- Left Comments: antalgic, decreased knee flexion in swing, decreased TKE at heel strike   TODAY'S TREATMENT:  05/15/24 Therapeutic Exercise: To improve strength, ROM, and flexibility.  Nustep L2x33min UE/LE Seated heel slides L knee 2x10 Supine heel slides feet on peanut ball 2x10 with strap Supine AP legs elevated on peanut ball x 20  Gait Training: To normalize gait pattern and improve safety.  With RW- 3 laps around clinic 270 ft- cues for heel strike, knee flexion on swing  Neuromuscular Re-Ed: To improve coordination and proprioception.  Standing LLE while holding onto walker: Marching 2 x 10 Hip abduction 2 x  10 Hip extension 2 x 10 HS curl 2 x 10 Seated LLE SAQ knee on small ball 2x10  MODALITIES: Vasopneumatic ice x 10' to L knee at minimal compression x 34 degrees 05/13/24 SELF CARE: Provided education on PT POC progression and on post-surgical precautions.initial HEP  MODALITIES: Vasopneumatic ice x 13' to L knee at minimal compression x 34 degrees  PATIENT EDUCATION:  Education details: PT eval findings, anticipated POC, and initial HEP  Person educated: Patient Education method: Explanation, Demonstration, Verbal cues, Tactile cues, Handouts, and MedBridgeGO app access provided Education comprehension: verbalized understanding, verbal cues required, tactile cues required, and needs further education  HOME EXERCISE PROGRAM: Access Code: [email or web address] URL: [email or web address] Date: 05/13/2024 Prepared by: Garnette Montclair  Exercises - Supine Ankle Pumps  - 1 x daily - 7 x weekly - 3 sets - 10 reps - Supine Quad Set  - 1 x daily - 7 x weekly - 3 sets - 10 reps - Supine Heel Slides  - 1 x daily - 7 x weekly - 3 sets - 10 reps - Supine Short Arc Quad  - 1 x daily - 7 x weekly - 3 sets - 10 reps - Supine Hip Abduction  - 1 x daily - 7 x weekly - 1-2 sets - 10 reps - Supine Straight  Leg Raises  - 1 x daily - 7 x weekly - 1-2 sets - 10 reps - Standing Ankle Dorsiflexion with Table Support  - 1 x daily - 7 x weekly - 3 sets - 10 reps - Standing Heel Raises  - 1 x daily - 7 x weekly - 3 sets - 10 reps - Standing March with Counter Support  - 1 x daily - 7 x weekly - 3 sets - 10 reps - Standing Hip Abduction with Counter Support  - 1 x daily - 7 x weekly - 3 sets - 10 reps - Standing Hip Extension with Counter Support  - 1 x daily - 7 x weekly - 3 sets - 10 reps - Standing Knee Flexion with Counter Support  - 1 x daily - 7 x weekly - 3 sets - 10 reps - Seated Knee Extension Stretch with Chair  - 1 x daily - 7 x weekly - 1 sets - 3 reps - 3-5 min hold - Seated Knee Flexion  Stretch  - 1 x daily - 7 x weekly - 1 sets - 2-3 reps - 1 min hold   ASSESSMENT:  CLINICAL IMPRESSION: Pt was able to complete all interventions, with cuing to correct, correct gait pattern, and to push into full knee ROM. Vaso post session to address swelling/pain. Per patient the PA she saw was pleased, she is negative for DVT, and will f/u with MD in 4 weeks.   Taj Arteaga is a 58 y.o. female who was referred to physical therapy for evaluation and treatment for L TKA.  She is well known to us  from recent R TKA.  Surgery was 12/2, so she is 2 weeks post op.   Surprisingly she is not as stiff as we would have expected for 2 weeks without PT.   ROM is -5 to 85 degrees.    Pain is worse with end range flexion or extension of the L knee and with prolonged ambulation.  Patient has deficits in L knee ROM, LLE strength, walking,balance, edema, and pain which are interfering with ADLs and are impacting quality of life.  On LEFS patient scored 30/80 demonstrating moderate functional limitation.  Rhaya will benefit from skilled PT to address above deficits to improve mobility and activity tolerance with decreased pain interference.   ---OF NOTE--the patient began to c/o calf pain at the end of vasopneumatic treatment today.   She relates the pain is posterolateral at the popliteal fossa and just distal to the fibular head on the upper lateral calf.   She states she has been having this pain since surgery.  However, she reports her eliquis  was D/C early due to bruising so she has not been taking it.   Contacted Dr Benjiman office and informed them of the above information.  Patient will f/u with their office tomorrow for bandage change/removal and is advised to discuss this pain with them.  She is advised to call 911 if she experiences any chest pain, SOB, diaphoresis, etc.    OBJECTIVE IMPAIRMENTS: difficulty walking, decreased ROM, decreased strength, increased edema, and pain.   ACTIVITY LIMITATIONS:  carrying, lifting, bending, standing, squatting, stairs, and locomotion level  PARTICIPATION LIMITATIONS: meal prep, cleaning, laundry, driving, shopping, and community activity  PERSONAL FACTORS: Age and 1-2 comorbidities: DM, HTN, bronchitis, R TKA are also affecting patient's functional outcome.   REHAB POTENTIAL: Good  CLINICAL DECISION MAKING: Evolving/moderate complexity  EVALUATION COMPLEXITY: Moderate   GOALS: Goals reviewed with patient? Yes  SHORT TERM GOALS: Target date: 06/10/2024   Patient will be independent with initial HEP. Baseline: 100% PT assist required for correct completion Goal status: INITIAL  2.  Patient will report at least 25% improvement in L knee pain to improve QOL. Baseline: 8/10 worst Goal status: INITIAL  3.  Patient will ambulate with a SPC or no device indoors on level ground independently with good balance Baseline: walker dependent with SBA  Goal status: INITIAL   LONG TERM GOALS: Target date: 07/08/2024   Patient will be independent with advanced/ongoing HEP to improve outcomes and carryover.  Baseline: no advanced HEP yet Goal status: INITIAL  2.  Patient will report at least 50-75% improvement in L knee pain to improve QOL. Baseline: 8/10 worst Goal status: INITIAL  3.  Patient will demonstrate improved L knee AROM to >/= 0-130 deg to allow for normal gait and stair mechanics. Baseline: Refer to above LE ROM table Goal status: INITIAL  4.  Patient will demonstrate improved LLE strength to >/= 5/5 for improved stability and ease of mobility. Baseline: Refer to above LE MMT table Goal status: INITIAL  5.  Patient will be able to ambulate 30' with no device and normal gait pattern without increased pain to access community.  Baseline: 5 min Goal status: INITIAL  6. Patient will be able to ascend/descend stairs with 1 HR and reciprocal step pattern safely to access home and community.  Baseline: 1 step at a time holding rail  leading with RLE only going up/LLE only going down Goal status: INITIAL  7.  Patient will report >/= 50/80 on LEFS (MCID = 9 pts) to demonstrate improved functional ability. Baseline: 30/80 Goal status: INITIAL  8.  Patient will demonstrate at least 19/24 on DGI to decrease risk of falls. Baseline: TBD Goal status: INITIAL   9.  Patient will improve on TUG test to </= 14 sec to reduce fall risk Baseline: 25.61 sec Goal status: INITIAL  10.  Patient will improve on 5X STS to </= to 12 sec to demonstrate improved glut/quad strength for independence with transfers from low seating surfaces   PLAN:  PT FREQUENCY: 1-2x/week  PT DURATION: 8 weeks  PLANNED INTERVENTIONS: 97750- Physical Performance Testing, 97110-Therapeutic exercises, 97530- Therapeutic activity, 97112- Neuromuscular re-education, 97535- Self Care, 02859- Manual therapy, [id number]- Electrical stimulation (unattended), 97016- Vasopneumatic device, 20560 (1-2 muscles), 20561 (3+ muscles)- Dry Needling, Patient/Family education, Taping, Joint mobilization, Cryotherapy, and Moist heat  PLAN FOR NEXT SESSION: progress ROM, strength, gait, balance, control edema with vasopneumatic ice   Gavon Majano L Vicki Pasqual, PTA 05/15/2024, 10:47 AM

## 2024-05-16 ENCOUNTER — Encounter (HOSPITAL_BASED_OUTPATIENT_CLINIC_OR_DEPARTMENT_OTHER): Payer: Self-pay

## 2024-05-16 ENCOUNTER — Other Ambulatory Visit: Payer: Self-pay

## 2024-05-16 ENCOUNTER — Ambulatory Visit

## 2024-05-16 ENCOUNTER — Emergency Department (HOSPITAL_BASED_OUTPATIENT_CLINIC_OR_DEPARTMENT_OTHER)
Admission: EM | Admit: 2024-05-16 | Discharge: 2024-05-16 | Disposition: A | Discharge status: Home / Self Care | Source: Home / Self Care | Attending: Emergency Medicine | Admitting: Emergency Medicine

## 2024-05-16 DIAGNOSIS — K5900 Constipation, unspecified: Secondary | ICD-10-CM | POA: Diagnosis present

## 2024-05-16 DIAGNOSIS — E119 Type 2 diabetes mellitus without complications: Secondary | ICD-10-CM | POA: Diagnosis not present

## 2024-05-16 DIAGNOSIS — Z96651 Presence of right artificial knee joint: Secondary | ICD-10-CM | POA: Insufficient documentation

## 2024-05-16 DIAGNOSIS — K5903 Drug induced constipation: Secondary | ICD-10-CM | POA: Diagnosis not present

## 2024-05-16 DIAGNOSIS — Z7901 Long term (current) use of anticoagulants: Secondary | ICD-10-CM | POA: Diagnosis not present

## 2024-05-16 DIAGNOSIS — Z794 Long term (current) use of insulin: Secondary | ICD-10-CM | POA: Insufficient documentation

## 2024-05-16 LAB — CBC WITH DIFFERENTIAL/PLATELET
Abs Immature Granulocytes: 0.02 K/uL (ref 0.00–0.07)
Basophils Absolute: 0.1 K/uL (ref 0.0–0.1)
Basophils Relative: 1 %
Eosinophils Absolute: 0.1 K/uL (ref 0.0–0.5)
Eosinophils Relative: 1 %
HCT: 35.9 % — ABNORMAL LOW (ref 36.0–46.0)
Hemoglobin: 11.6 g/dL — ABNORMAL LOW (ref 12.0–15.0)
Immature Granulocytes: 0 %
Lymphocytes Relative: 32 %
Lymphs Abs: 2.6 K/uL (ref 0.7–4.0)
MCH: 30.7 pg (ref 26.0–34.0)
MCHC: 32.3 g/dL (ref 30.0–36.0)
MCV: 95 fL (ref 80.0–100.0)
Monocytes Absolute: 0.5 K/uL (ref 0.1–1.0)
Monocytes Relative: 7 %
Neutro Abs: 4.7 K/uL (ref 1.7–7.7)
Neutrophils Relative %: 59 %
Platelets: 441 K/uL — ABNORMAL HIGH (ref 150–400)
RBC: 3.78 MIL/uL — ABNORMAL LOW (ref 3.87–5.11)
RDW: 13.2 % (ref 11.5–15.5)
WBC: 7.9 K/uL (ref 4.0–10.5)
nRBC: 0 % (ref 0.0–0.2)

## 2024-05-16 LAB — COMPREHENSIVE METABOLIC PANEL WITH GFR
ALT: 48 U/L — ABNORMAL HIGH (ref 0–44)
AST: 19 U/L (ref 15–41)
Albumin: 4.3 g/dL (ref 3.5–5.0)
Alkaline Phosphatase: 99 U/L (ref 38–126)
Anion gap: 13 (ref 5–15)
BUN: 14 mg/dL (ref 6–20)
CO2: 26 mmol/L (ref 22–32)
Calcium: 10.3 mg/dL (ref 8.9–10.3)
Chloride: 102 mmol/L (ref 98–111)
Creatinine, Ser: 0.73 mg/dL (ref 0.44–1.00)
GFR, Estimated: >60 mL/min (ref >60)
Glucose, Bld: 179 mg/dL — ABNORMAL HIGH (ref 70–99)
Potassium: 4.1 mmol/L (ref 3.5–5.1)
Sodium: 140 mmol/L (ref 135–145)
Total Bilirubin: 0.6 mg/dL (ref 0.0–1.2)
Total Protein: 7.6 g/dL (ref 6.5–8.1)

## 2024-05-16 LAB — LIPASE, BLOOD: Lipase: 19 U/L (ref 11–51)

## 2024-05-16 MED ORDER — FLEET ENEMA RE ENEM
1.0000 | ENEMA | Freq: Once | RECTAL | Status: AC
  Administered 2024-05-16: 20:00:00 1 via RECTAL
  Filled 2024-05-16: qty 1

## 2024-05-16 MED ORDER — KETOROLAC TROMETHAMINE 15 MG/ML IJ SOLN
15.0000 mg | Freq: Once | INTRAMUSCULAR | Status: AC
  Administered 2024-05-16: 20:00:00 15 mg via INTRAVENOUS
  Filled 2024-05-16: qty 1

## 2024-05-16 NOTE — Discharge Instructions (Addendum)
 Is a pleasure taking care of you today.  You were seen in the emergency department for evaluation of constipation.  I did treat your constipation with a Fleet enema, which resulted in a bowel movement.  I do suspect your constipation was likely secondary to taking the opioid medication you are prescribed after your knee surgery.  As we discussed, please continue taking the stool softener twice a day while you are taking the opioids.  I also recommend you continue to take 1 capful of MiraLAX every day while you are taking the opioids.  Please also continue to stay hydrated with water and/or electrolytes.  Return to the emergency department if you experience worsening symptoms, chest pain, shortness of breath, seizures, uncontrollable vomiting and or any other life-threatening emergency.

## 2024-05-16 NOTE — ED Triage Notes (Signed)
 Pt states that left knee surgery 2 weeks ago. States that she is constipated from the meds she has been taking. States that she has been trying to go but nothing can come out. Pt went to [facility] and they didn't have resources to help.

## 2024-05-16 NOTE — ED Provider Notes (Signed)
 [facility] Provider Note   CSN: [id number] Arrival date & time: 05/16/24  1643     Patient presents with: Constipation   Ann Foster is a 58 y.o. female who with past medical history of diabetes, 2 weeks status post total right knee replacement, who presents emergency department for evaluation of constipation.  Patient reports that she normally has 2 bowel movements a day, and her last bowel movement was 3 days ago.  Patient has been taking opioid pain medicine since her surgery 2 weeks ago, and her reports she has been taking Colace as prescribed.  However, she states it is not working.  Patient is diffusely tearful and stating she is in significant pain.  She denies diffuse rectal bleeding, but does report some blood on the toilet paper when straining.  She states she is passing flatus and has been urinating as normal.  No nausea or vomiting.   Constipation      Prior to Admission medications  Medication Sig Start Date End Date Taking? Authorizing Provider  Accu-Chek Softclix Lancets lancets Check blood glucose level by fingerstick once per day. E11.65 09/27/23   Fleming, Zelda W, NP  albuterol  (VENTOLIN  HFA) [phone] Base) MCG/ACT inhaler Inhale 2 puffs into the lungs every 6 (six) hours as needed for wheezing or shortness of breath. 09/18/23   Theotis Haze ORN, NP  apixaban  (ELIQUIS ) 2.5 MG TABS tablet Take 1 tablet (2.5 mg total) by mouth 2 (two) times daily after surgery to prevent blood clots. 04/29/24 05/30/24  Jule Ronal CROME, PA-C  atorvastatin  (LIPITOR) 80 MG tablet Take 1 tablet (80 mg total) by mouth daily. 02/27/24   Fleming, Zelda W, NP  Blood Glucose Monitoring Suppl (ACCU-CHEK GUIDE) w/Device KIT Check blood glucose level by fingerstick once per day. E11.65 05/07/21   Newlin, Enobong, MD  buPROPion  (WELLBUTRIN  SR) 150 MG 12 hr tablet TAKE 1 TABLET(150 MG) BY MOUTH TWICE DAILY 06/12/23   Ajewole, Christana, MD  busPIRone   (BUSPAR ) 10 MG tablet Take 1 tablet (10 mg total) by mouth 2 (two) times daily. 05/01/24   Fleming, Zelda W, NP  dapagliflozin  propanediol (FARXIGA ) 5 MG TABS tablet Take 1 tablet (5 mg total) by mouth daily before breakfast. 12/18/23   Fleming, Zelda W, NP  docusate sodium  (COLACE) 100 MG capsule Take 1 capsule (100 mg total) by mouth daily as needed. 11/14/23 11/13/24  Jule Ronal CROME, PA-C  docusate sodium  (COLACE) 100 MG capsule Take 1 capsule (100 mg total) by mouth daily as needed. 04/15/24 04/15/25  Jule Ronal CROME, PA-C  doxycycline  (VIBRA -TABS) 100 MG tablet Take 1 tablet (100 mg total) by mouth 2 (two) times daily. To be taken after surgery 04/15/24   Jule Ronal CROME, PA-C  estradiol  (ESTRACE ) 0.1 MG/GM vaginal cream Apply 1 gram per vagina every night for 2 weeks, then apply three times a week 02/17/23   Jeralyn Crutch, MD  glucose blood (ACCU-CHEK GUIDE) test strip Check blood glucose level by fingerstick once per day. E11.65 02/16/23   McClung, Angela M, PA-C  HYDROcodone -acetaminophen  (NORCO) 7.5-325 MG tablet Take 1-2 tablets by mouth every 6 (six) hours as needed for moderate pain (pain score 4-6). 05/14/24   Jule Ronal CROME, PA-C  Insulin  Pen Needle (B-D UF III MINI PEN NEEDLES) 31G X 5 MM MISC Use as instructed. Inject into the skin once weekly 09/18/23   Fleming, Zelda W, NP  methocarbamol  (ROBAXIN ) 750 MG tablet Take 1 tablet (750 mg total)  by mouth 3 (three) times daily as needed. 04/15/24   Jule Ronal CROME, PA-C  montelukast  (SINGULAIR ) 10 MG tablet Take 1 tablet (10 mg total) by mouth at bedtime. 02/16/23   Danton Jon HERO, PA-C  omeprazole  (PRILOSEC) 40 MG capsule Take 1 capsule (40 mg total) by mouth daily. 11/29/21   Fleming, Zelda W, NP  ondansetron  (ZOFRAN ) 4 MG tablet Take 1 tablet (4 mg total) by mouth every 8 (eight) hours as needed for nausea or vomiting. 04/15/24   Jule Ronal CROME, PA-C  ramipril  (ALTACE ) 2.5 MG capsule Take 1 capsule (2.5 mg total) by mouth daily.  12/18/23   Theotis Haze ORN, NP  TRULICITY  3 MG/0.5ML SOAJ ADMINISTER 3 MG UNDER THE SKIN 1 TIME A WEEK AS DIRECTED 02/15/24   Newlin, Enobong, MD  lisinopril  (ZESTRIL ) 2.5 MG tablet Take 1 tablet (2.5 mg total) by mouth 2 (two) times daily. 09/12/19 04/22/20  Newlin, Enobong, MD    Allergies: Patient has no known allergies.    Review of Systems  Gastrointestinal:  Positive for constipation.    Updated Vital Signs BP 126/76 (BP Location: Right Arm)   Pulse 85   Temp 98.4 F (36.9 C) (Oral)   Resp 18   Ht 5' 3 (1.6 m)   Wt 88.9 kg   SpO2 97%   BMI 34.72 kg/m   Physical Exam Vitals and nursing note reviewed.  Constitutional:      Appearance: Normal appearance.  HENT:     Head: Normocephalic and atraumatic.     Mouth/Throat:     Mouth: Mucous membranes are moist.  Eyes:     General: No scleral icterus.       Right eye: No discharge.        Left eye: No discharge.     Conjunctiva/sclera: Conjunctivae normal.  Cardiovascular:     Rate and Rhythm: Normal rate and regular rhythm.     Pulses: Normal pulses.  Pulmonary:     Effort: Pulmonary effort is normal.     Breath sounds: Normal breath sounds.  Abdominal:     General: There is no distension.     Palpations: Abdomen is soft.     Tenderness: There is abdominal tenderness.     Comments: Patient with tenderness to palpation in the suprapubic region.  Musculoskeletal:        General: No deformity.     Cervical back: Normal range of motion.  Skin:    General: Skin is warm and dry.     Capillary Refill: Capillary refill takes less than 2 seconds.  Neurological:     Mental Status: She is alert.     Motor: No weakness.  Psychiatric:        Mood and Affect: Mood normal.     (all labs ordered are listed, but only abnormal results are displayed) Labs Reviewed - No data to display  EKG: None  Radiology: No results found.  Procedures   Medications Ordered in the ED - No data to display                                  Medical Decision Making  This patient presents to the ED for concern of ***, this involves an extensive number of treatment options, and is a complaint that carries with it a high risk of complications and morbidity.  *** Differential diagnosis includes: ***  Co morbidities: ***  Social Determinants  of Health:  ***  Additional history:  {Additional history obtained from ***} {External records from outside source obtained and reviewed including} {Chart review for pertinents}  Lab Tests:  I Ordered, and personally interpreted labs.  The pertinent results include:  ***  Imaging Studies:  I ordered imaging studies including *** I independently visualized and interpreted imaging which showed *** I agree with the radiologist interpretation  Cardiac Monitoring/ECG:  The patient was maintained on a cardiac monitor.  I personally viewed and interpreted the cardiac monitored which showed an underlying rhythm of: ***  Medicines ordered and prescription drug management:  I ordered medication including  Medications  ketorolac  (TORADOL ) 15 MG/ML injection 15 mg (has no administration in time range)  sodium phosphate  (FLEET) enema 1 enema (has no administration in time range)   for pain and constipation Reevaluation of the patient after these medicines showed that the patient {resolved/improved/worsened:23923::improved} I have reviewed the patients home medicines and have made adjustments as needed  Test Considered:   none  Critical Interventions:   none  Consultations Obtained: ***  Problem List / ED Course:  No diagnosis found.  MDM: 58 year old female who presents emergency department for evaluation of constipation.  Patient recently underwent right TKR approximately 2 weeks ago and has been taking opioid medicine to manage the pain.  I suspect, this is likely the cause of her constipation, but I did order basic abdominal labs to rule out any other cause.   Patient is reportedly passing flatus, so no immediate indication to order imaging.  I did order Toradol  for pain, as I did not want to continue giving her opioids and increase her risk of constipation.  I also ordered a Fleet enema, to encourage the patient to have a bowel movement.  Upon reassessment,***   Dispostion:  After consideration of the diagnostic results and the patients response to treatment, I feel that the patient would benefit from ***.   {Document critical care time when appropriate  Document review of labs and clinical decision tools ie CHADS2VASC2, etc  Document your independent review of radiology images and any outside records  Document your discussion with family members, caretakers and with consultants  Document social determinants of health affecting pt's care  Document your decision making why or why not admission, treatments were needed:32947:::1}   Final diagnoses:  None    ED Discharge Orders     None

## 2024-05-20 ENCOUNTER — Ambulatory Visit

## 2024-05-20 DIAGNOSIS — M25561 Pain in right knee: Secondary | ICD-10-CM

## 2024-05-20 DIAGNOSIS — R600 Localized edema: Secondary | ICD-10-CM

## 2024-05-20 DIAGNOSIS — R262 Difficulty in walking, not elsewhere classified: Secondary | ICD-10-CM

## 2024-05-20 DIAGNOSIS — M25661 Stiffness of right knee, not elsewhere classified: Secondary | ICD-10-CM

## 2024-05-20 DIAGNOSIS — M6281 Muscle weakness (generalized): Secondary | ICD-10-CM

## 2024-05-20 NOTE — Therapy (Signed)
 " OUTPATIENT PHYSICAL THERAPY LOWER EXTREMITY TREATMENT   Patient Name: Ann Foster MRN: [id number] DOB:[birth year], 58 y.o., female Today's Date: 05/20/2024   END OF SESSION:  PT End of Session - 05/20/24 1551     Visit Number 3    Date for Recertification  07/08/24    PT Start Time 1534    PT Stop Time 1614    PT Time Calculation (min) 40 min            Past Medical History:  Diagnosis Date   Ankle fracture 2016   Right   Aortic atherosclerosis    trace calcific atherosclerosis aortic arch per ct neck done 12-22-17   Bronchitis    Diabetes mellitus without complication (HCC)    GERD (gastroesophageal reflux disease)    Hypertension    OA (osteoarthritis) of shoulder    left shoulder, both knees arthritis   Osteoarthritis, knee    Sleep apnea    wears CPAP   Past Surgical History:  Procedure Laterality Date   CHOLECYSTECTOMY N/A 04/15/2023   Procedure: LAPAROSCOPIC CHOLECYSTECTOMY WITH ICG DYE;  Surgeon: Ebbie Cough, MD;  Location: [facility];  Service: General;  Laterality: N/A;   COLONOSCOPY     TOTAL KNEE ARTHROPLASTY Right 11/20/2023   Procedure: ARTHROPLASTY, KNEE, TOTAL RIGHT;  Surgeon: Jerri Kay HERO, MD;  Location: [facility] OR;  Service: Orthopedics;  Laterality: Right;   TOTAL KNEE ARTHROPLASTY Left 04/29/2024   Procedure: ARTHROPLASTY, KNEE, TOTAL;  Surgeon: Jerri Kay HERO, MD;  Location: [facility] OR;  Service: Orthopedics;  Laterality: Left;   Patient Active Problem List   Diagnosis Date Noted   Status post total left knee replacement 04/29/2024   Primary osteoarthritis of left knee 02/22/2024   Status post total right knee replacement 11/20/2023   Snoring 06/07/2023   Acute cholecystitis 04/13/2023   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, with long-term current use of insulin  (HCC) 04/25/2022   Amblyopia of eye, right 07/27/2021   Corneal guttata of left eye 07/27/2021   Influenza vaccine  refused 07/29/2020   COVID-19 vaccine dose declined 07/29/2020   Vasomotor symptoms due to menopause 04/24/2020   Atrophic vaginitis 04/24/2020   Impingement syndrome of left shoulder 01/23/2018    PCP: Theotis Haze ORN, NP   REFERRING PROVIDER: Jule Ronal CROME, PA-C   REFERRING DIAG: [phone] (ICD-10-CM) - Hx of total knee replacement, left  THERAPY DIAG:  Acute pain of right knee  Stiffness of right knee, not elsewhere classified  Difficulty in walking, not elsewhere classified  Muscle weakness (generalized)  Localized edema  RATIONALE FOR EVALUATION AND TREATMENT: Rehabilitation  ONSET DATE: 04/30/24  NEXT MD VISIT: 05/13/24   SUBJECTIVE:  SUBJECTIVE STATEMENT:    58 y/o patient referred to PT for L TKA by Dr Jerri on 04/30/24.  She is well known to us  from recent R TKA.   She is 2 weeks post op tomorrow.   Unclear why it took so long for her to get into see us .   She did not have any HH PT.   She reports she has been doing some of her home exercises from her R TKA on her own at home.  She has an Aquacel bandage in place over the L knee incision that is going to be removed/changed tomorrow when she f/u with Dr Jerri.  The bandage is loose, but it has not loosened to the inner square yet so the incision is not exposed.   Patient is advised that if it does come loose exposing the incision that she is call MD office and report.   She is also recommended not to shower or get the bandage wet until after MD appointment tomorrow, since it is just barely intact.     PAIN: Are you having pain? Yes: NPRS scale: 8/10 Pain location: L knee Pain description: aching/sore always,  sharp at times Aggravating factors: prolonged walking, end range stretching into flexion/extension Relieving factors:  rest, ice, muscle relaxers  PERTINENT HISTORY:  DM, HTN, bronchitis, R TKA  PRECAUTIONS: None  RED FLAGS: None  WEIGHT BEARING RESTRICTIONS: No  FALLS:  Has patient fallen in last 6 months? No  LIVING ENVIRONMENT: Lives with: lives with their family Lives in: House/apartment Stairs: Yes: External: 1 steps; none Has following equipment at home: Single point cane, Walker - 2 wheeled, and bed side commode  OCCUPATION: retired  PLOF: Independent with gait  PATIENT GOALS: get back 100%    OBJECTIVE: (objective measures completed at initial evaluation unless otherwise dated)  DIAGNOSTIC FINDINGS:  N/A  PATIENT SURVEYS:  LEFS  Extreme difficulty/unable (0), Quite a bit of difficulty (1), Moderate difficulty (2), Little difficulty (3), No difficulty (4) Survey date:  05/13/24  Any of your usual work, housework or school activities 1  2. Usual hobbies, recreational or sporting activities 1  3. Getting into/out of the bath 3  4. Walking between rooms 3  5. Putting on socks/shoes 3  6. Squatting  1  7. Lifting an object, like a bag of groceries from the floor 1  8. Performing light activities around your home 2  9. Performing heavy activities around your home 1  10. Getting into/out of a car 2  11. Walking 2 blocks 1  12. Walking 1 mile 0  13. Going up/down 10 stairs (1 flight) 2  14. Standing for 1 hour 3  15.  sitting for 1 hour 3  16. Running on even ground 0  17. Running on uneven ground 0  18. Making sharp turns while running fast 0  19. Hopping  0  20. Rolling over in bed 3  Score total:  30/80     COGNITION: Overall cognitive status: Within functional limits for tasks assessed    SENSATION: WFL  EDEMA:  Circumferential: L knee = 48.5 cm;  R knee = 44.0 cm  POSTURE:  No Significant postural limitations  PALPATION: Tender to palpate around the L knee in general   LOWER EXTREMITY ROM:  Active ROM Right eval Left eval  Hip flexion    Hip  extension    Hip abduction    Hip adduction    Hip internal rotation    Hip  external rotation    Knee flexion    Knee extension    Ankle dorsiflexion    Ankle plantarflexion    Ankle inversion    Ankle eversion     Passive ROM Right eval Left eval 05/15/24  Hip flexion     Hip extension     Hip abduction     Hip adduction     Hip internal rotation     Hip external rotation     Knee flexion 90 85 98  Knee extension 0 -6 6  Ankle dorsiflexion     Ankle plantarflexion     Ankle inversion     Ankle eversion     (Blank rows = not tested)  LOWER EXTREMITY MMT:  MMT Right eval Left eval  Hip flexion 5 4-  Hip extension    Hip abduction 4+ 3+  Hip adduction    Hip internal rotation 4+ 4-  Hip external rotation 5 4  Knee flexion 5 4  Knee extension 5 3+  Ankle dorsiflexion 5 4+  Ankle plantarflexion    Ankle inversion    Ankle eversion     (Blank rows = not tested)  LOWER EXTREMITY SPECIAL TESTS:  N/a  FUNCTIONAL TESTS:  5X STS = 26.49 sec TUG = 25.61 sec  GAIT: Distance walked: into clinic x 150' Assistive device utilized: Environmental Consultant - 2 wheeled Level of assistance: SBA Gait pattern: decreased step length- Left and decreased stance time- Left Comments: antalgic, decreased knee flexion in swing, decreased TKE at heel strike   TODAY'S TREATMENT:  05/20/24 Nustep L2x73min UE/LE Standing L knee flexion 4 inch 2x10 Standing heel raises from 4 inch step x 12 Supine QS with therapist hand under knee 2x8 Supine heel slides with strap 2x10 Longsitting gastroc stretch with strap 2x30 sec Assisted SLR in supine x 10  05/15/24 Therapeutic Exercise: To improve strength, ROM, and flexibility.  Nustep L2x8min UE/LE Seated heel slides L knee 2x10 Supine heel slides feet on peanut ball 2x10 with strap Supine AP legs elevated on peanut ball x 20  Gait Training: To normalize gait pattern and improve safety.  With RW- 3 laps around clinic 270 ft- cues for heel strike, knee  flexion on swing  Neuromuscular Re-Ed: To improve coordination and proprioception.  Standing LLE while holding onto walker: Marching 2 x 10 Hip abduction 2 x 10 Hip extension 2 x 10 HS curl 2 x 10 Seated LLE SAQ knee on small ball 2x10  MODALITIES: Vasopneumatic ice x 10' to L knee at minimal compression x 34 degrees 05/13/24 SELF CARE: Provided education on PT POC progression and on post-surgical precautions.initial HEP  MODALITIES: Vasopneumatic ice x 13' to L knee at minimal compression x 34 degrees  PATIENT EDUCATION:  Education details: PT eval findings, anticipated POC, and initial HEP  Person educated: Patient Education method: Explanation, Demonstration, Verbal cues, Tactile cues, Handouts, and MedBridgeGO app access provided Education comprehension: verbalized understanding, verbal cues required, tactile cues required, and needs further education  HOME EXERCISE PROGRAM: Access Code: [email or web address] URL: [email or web address] Date: 05/13/2024 Prepared by: Garnette Montclair  Exercises - Supine Ankle Pumps  - 1 x daily - 7 x weekly - 3 sets - 10 reps - Supine Quad Set  - 1 x daily - 7 x weekly - 3 sets - 10 reps - Supine Heel Slides  - 1 x daily - 7 x weekly - 3 sets - 10 reps - Supine Short Arc Quad  - 1 x daily -  7 x weekly - 3 sets - 10 reps - Supine Hip Abduction  - 1 x daily - 7 x weekly - 1-2 sets - 10 reps - Supine Straight Leg Raises  - 1 x daily - 7 x weekly - 1-2 sets - 10 reps - Standing Ankle Dorsiflexion with Table Support  - 1 x daily - 7 x weekly - 3 sets - 10 reps - Standing Heel Raises  - 1 x daily - 7 x weekly - 3 sets - 10 reps - Standing March with Counter Support  - 1 x daily - 7 x weekly - 3 sets - 10 reps - Standing Hip Abduction with Counter Support  - 1 x daily - 7 x weekly - 3 sets - 10 reps - Standing Hip Extension with Counter Support  - 1 x daily - 7 x weekly - 3 sets - 10 reps - Standing Knee Flexion with Counter Support  - 1 x  daily - 7 x weekly - 3 sets - 10 reps - Seated Knee Extension Stretch with Chair  - 1 x daily - 7 x weekly - 1 sets - 3 reps - 3-5 min hold - Seated Knee Flexion Stretch  - 1 x daily - 7 x weekly - 1 sets - 2-3 reps - 1 min hold   ASSESSMENT:  CLINICAL IMPRESSION: Pt doing well at this point. Continued progressing strength and ROM. Corrected gait with SPC to use in R hand vs L hand and this increased her stride length. Pt able to complete interventions with cuing. No vaso today as she was rushing to get to her transportation post session.   Ann Foster is a 58 y.o. female who was referred to physical therapy for evaluation and treatment for L TKA.  She is well known to us  from recent R TKA.  Surgery was 12/2, so she is 2 weeks post op.   Surprisingly she is not as stiff as we would have expected for 2 weeks without PT.   ROM is -5 to 85 degrees.    Pain is worse with end range flexion or extension of the L knee and with prolonged ambulation.  Patient has deficits in L knee ROM, LLE strength, walking,balance, edema, and pain which are interfering with ADLs and are impacting quality of life.  On LEFS patient scored 30/80 demonstrating moderate functional limitation.  [place] will benefit from skilled PT to address above deficits to improve mobility and activity tolerance with decreased pain interference.   ---OF NOTE--the patient began to c/o calf pain at the end of vasopneumatic treatment today.   She relates the pain is posterolateral at the popliteal fossa and just distal to the fibular head on the upper lateral calf.   She states she has been having this pain since surgery.  However, she reports her eliquis  was D/C early due to bruising so she has not been taking it.   Contacted Dr Benjiman office and informed them of the above information.  Patient will f/u with their office tomorrow for bandage change/removal and is advised to discuss this pain with them.  She is advised to call 911 if she  experiences any chest pain, SOB, diaphoresis, etc.    OBJECTIVE IMPAIRMENTS: difficulty walking, decreased ROM, decreased strength, increased edema, and pain.   ACTIVITY LIMITATIONS: carrying, lifting, bending, standing, squatting, stairs, and locomotion level  PARTICIPATION LIMITATIONS: meal prep, cleaning, laundry, driving, shopping, and community activity  PERSONAL FACTORS: Age and 1-2 comorbidities: DM,  HTN, bronchitis, R TKA are also affecting patient's functional outcome.   REHAB POTENTIAL: Good  CLINICAL DECISION MAKING: Evolving/moderate complexity  EVALUATION COMPLEXITY: Moderate   GOALS: Goals reviewed with patient? Yes  SHORT TERM GOALS: Target date: 06/10/2024   Patient will be independent with initial HEP. Baseline: 100% PT assist required for correct completion Goal status: INITIAL  2.  Patient will report at least 25% improvement in L knee pain to improve QOL. Baseline: 8/10 worst Goal status: INITIAL  3.  Patient will ambulate with a SPC or no device indoors on level ground independently with good balance Baseline: walker dependent with SBA  Goal status: INITIAL   LONG TERM GOALS: Target date: 07/08/2024   Patient will be independent with advanced/ongoing HEP to improve outcomes and carryover.  Baseline: no advanced HEP yet Goal status: INITIAL  2.  Patient will report at least 50-75% improvement in L knee pain to improve QOL. Baseline: 8/10 worst Goal status: INITIAL  3.  Patient will demonstrate improved L knee AROM to >/= 0-130 deg to allow for normal gait and stair mechanics. Baseline: Refer to above LE ROM table Goal status: INITIAL  4.  Patient will demonstrate improved LLE strength to >/= 5/5 for improved stability and ease of mobility. Baseline: Refer to above LE MMT table Goal status: INITIAL  5.  Patient will be able to ambulate 30' with no device and normal gait pattern without increased pain to access community.  Baseline: 5 min Goal  status: INITIAL  6. Patient will be able to ascend/descend stairs with 1 HR and reciprocal step pattern safely to access home and community.  Baseline: 1 step at a time holding rail leading with RLE only going up/LLE only going down Goal status: INITIAL  7.  Patient will report >/= 50/80 on LEFS (MCID = 9 pts) to demonstrate improved functional ability. Baseline: 30/80 Goal status: INITIAL  8.  Patient will demonstrate at least 19/24 on DGI to decrease risk of falls. Baseline: TBD Goal status: INITIAL   9.  Patient will improve on TUG test to </= 14 sec to reduce fall risk Baseline: 25.61 sec Goal status: INITIAL  10.  Patient will improve on 5X STS to </= to 12 sec to demonstrate improved glut/quad strength for independence with transfers from low seating surfaces   PLAN:  PT FREQUENCY: 1-2x/week  PT DURATION: 8 weeks  PLANNED INTERVENTIONS: 97750- Physical Performance Testing, 97110-Therapeutic exercises, 97530- Therapeutic activity, 97112- Neuromuscular re-education, 97535- Self Care, 02859- Manual therapy, [id number]- Electrical stimulation (unattended), 97016- Vasopneumatic device, 20560 (1-2 muscles), 20561 (3+ muscles)- Dry Needling, Patient/Family education, Taping, Joint mobilization, Cryotherapy, and Moist heat  PLAN FOR NEXT SESSION: progress ROM, strength, gait, balance, control edema with vasopneumatic ice   Sol LITTIE Gaskins, PTA 05/20/2024, 4:15 PM  "

## 2024-05-22 ENCOUNTER — Ambulatory Visit

## 2024-05-22 DIAGNOSIS — M25661 Stiffness of right knee, not elsewhere classified: Secondary | ICD-10-CM

## 2024-05-22 DIAGNOSIS — R600 Localized edema: Secondary | ICD-10-CM

## 2024-05-22 DIAGNOSIS — R262 Difficulty in walking, not elsewhere classified: Secondary | ICD-10-CM

## 2024-05-22 DIAGNOSIS — M25561 Pain in right knee: Secondary | ICD-10-CM

## 2024-05-22 DIAGNOSIS — M6281 Muscle weakness (generalized): Secondary | ICD-10-CM

## 2024-05-22 NOTE — Therapy (Signed)
 " OUTPATIENT PHYSICAL THERAPY LOWER EXTREMITY TREATMENT   Patient Name: Ann Foster MRN: [id number] DOB:02/14/1966, 58 y.o., female Today's Date: 05/22/2024   END OF SESSION:  PT End of Session - 05/22/24 1033     Visit Number 4    Date for Recertification  07/08/24    PT Start Time 1018    PT Stop Time 1110    PT Time Calculation (min) 52 min             Past Medical History:  Diagnosis Date   Ankle fracture 2016   Right   Aortic atherosclerosis    trace calcific atherosclerosis aortic arch per ct neck done 12-22-17   Bronchitis    Diabetes mellitus without complication (HCC)    GERD (gastroesophageal reflux disease)    Hypertension    OA (osteoarthritis) of shoulder    left shoulder, both knees arthritis   Osteoarthritis, knee    Sleep apnea    wears CPAP   Past Surgical History:  Procedure Laterality Date   CHOLECYSTECTOMY N/A 04/15/2023   Procedure: LAPAROSCOPIC CHOLECYSTECTOMY WITH ICG DYE;  Surgeon: Ebbie Cough, MD;  Location: [facility];  Service: General;  Laterality: N/A;   COLONOSCOPY     TOTAL KNEE ARTHROPLASTY Right 11/20/2023   Procedure: ARTHROPLASTY, KNEE, TOTAL RIGHT;  Surgeon: Jerri Kay HERO, MD;  Location: [facility];  Service: Orthopedics;  Laterality: Right;   TOTAL KNEE ARTHROPLASTY Left 04/29/2024   Procedure: ARTHROPLASTY, KNEE, TOTAL;  Surgeon: Jerri Kay HERO, MD;  Location: [facility];  Service: Orthopedics;  Laterality: Left;   Patient Active Problem List   Diagnosis Date Noted   Status post total left knee replacement 04/29/2024   Primary osteoarthritis of left knee 02/22/2024   Status post total right knee replacement 11/20/2023   Snoring 06/07/2023   Acute cholecystitis 04/13/2023   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, with long-term current use of insulin  (HCC) 04/25/2022   Amblyopia of eye, right 07/27/2021   Corneal guttata of left eye 07/27/2021   Influenza vaccine  refused 07/29/2020   COVID-19 vaccine dose declined 07/29/2020   Vasomotor symptoms due to menopause 04/24/2020   Atrophic vaginitis 04/24/2020   Impingement syndrome of left shoulder 01/23/2018    PCP: Theotis Haze ORN, NP   REFERRING PROVIDER: Jule Ronal CROME, PA-C   REFERRING DIAG: [phone] (ICD-10-CM) - Hx of total knee replacement, left  THERAPY DIAG:  Acute pain of right knee  Stiffness of right knee, not elsewhere classified  Difficulty in walking, not elsewhere classified  Muscle weakness (generalized)  Localized edema  RATIONALE FOR EVALUATION AND TREATMENT: Rehabilitation  ONSET DATE: 04/30/24  NEXT MD VISIT: 05/13/24   SUBJECTIVE:  SUBJECTIVE STATEMENT: Pt reports 6/10 pain today   58 y/o patient referred to PT for L TKA by Dr Jerri on 04/30/24.  She is well known to us  from recent R TKA.   She is 2 weeks post op tomorrow.   Unclear why it took so long for her to get into see us .   She did not have any HH PT.   She reports she has been doing some of her home exercises from her R TKA on her own at home.  She has an Aquacel bandage in place over the L knee incision that is going to be removed/changed tomorrow when she f/u with Dr Jerri.  The bandage is loose, but it has not loosened to the inner square yet so the incision is not exposed.   Patient is advised that if it does come loose exposing the incision that she is call MD office and report.   She is also recommended not to shower or get the bandage wet until after MD appointment tomorrow, since it is just barely intact.     PAIN: Are you having pain? Yes: NPRS scale: 6/10 Pain location: L knee Pain description: aching/sore always,  sharp at times Aggravating factors: prolonged walking, end range stretching into  flexion/extension Relieving factors: rest, ice, muscle relaxers  PERTINENT HISTORY:  DM, HTN, bronchitis, R TKA  PRECAUTIONS: None  RED FLAGS: None  WEIGHT BEARING RESTRICTIONS: No  FALLS:  Has patient fallen in last 6 months? No  LIVING ENVIRONMENT: Lives with: lives with their family Lives in: House/apartment Stairs: Yes: External: 1 steps; none Has following equipment at home: Single point cane, Walker - 2 wheeled, and bed side commode  OCCUPATION: retired  PLOF: Independent with gait  PATIENT GOALS: get back 100%    OBJECTIVE: (objective measures completed at initial evaluation unless otherwise dated)  DIAGNOSTIC FINDINGS:  N/A  PATIENT SURVEYS:  LEFS  Extreme difficulty/unable (0), Quite a bit of difficulty (1), Moderate difficulty (2), Little difficulty (3), No difficulty (4) Survey date:  05/13/24  Any of your usual work, housework or school activities 1  2. Usual hobbies, recreational or sporting activities 1  3. Getting into/out of the bath 3  4. Walking between rooms 3  5. Putting on socks/shoes 3  6. Squatting  1  7. Lifting an object, like a bag of groceries from the floor 1  8. Performing light activities around your home 2  9. Performing heavy activities around your home 1  10. Getting into/out of a car 2  11. Walking 2 blocks 1  12. Walking 1 mile 0  13. Going up/down 10 stairs (1 flight) 2  14. Standing for 1 hour 3  15.  sitting for 1 hour 3  16. Running on even ground 0  17. Running on uneven ground 0  18. Making sharp turns while running fast 0  19. Hopping  0  20. Rolling over in bed 3  Score total:  30/80     COGNITION: Overall cognitive status: Within functional limits for tasks assessed    SENSATION: WFL  EDEMA:  Circumferential: L knee = 48.5 cm;  R knee = 44.0 cm  POSTURE:  No Significant postural limitations  PALPATION: Tender to palpate around the L knee in general   LOWER EXTREMITY ROM:  Active ROM Right eval  Left eval  Hip flexion    Hip extension    Hip abduction    Hip adduction    Hip internal rotation  Hip external rotation    Knee flexion    Knee extension    Ankle dorsiflexion    Ankle plantarflexion    Ankle inversion    Ankle eversion     Passive ROM Right eval Left eval 05/15/24 05/22/24 AROM  Hip flexion      Hip extension      Hip abduction      Hip adduction      Hip internal rotation      Hip external rotation      Knee flexion 90 85 98 103  Knee extension 0 -6 6   Ankle dorsiflexion      Ankle plantarflexion      Ankle inversion      Ankle eversion      (Blank rows = not tested)  LOWER EXTREMITY MMT:  MMT Right eval Left eval  Hip flexion 5 4-  Hip extension    Hip abduction 4+ 3+  Hip adduction    Hip internal rotation 4+ 4-  Hip external rotation 5 4  Knee flexion 5 4  Knee extension 5 3+  Ankle dorsiflexion 5 4+  Ankle plantarflexion    Ankle inversion    Ankle eversion     (Blank rows = not tested)  LOWER EXTREMITY SPECIAL TESTS:  N/a  FUNCTIONAL TESTS:  5X STS = 26.49 sec TUG = 25.61 sec  GAIT: Distance walked: into clinic x 150' Assistive device utilized: [employer] - 2 wheeled Level of assistance: SBA Gait pattern: decreased step length- Left and decreased stance time- Left Comments: antalgic, decreased knee flexion in swing, decreased TKE at heel strike   TODAY'S TREATMENT:  05/22/24 Nustep L3x32min UE/LE Gait around clinic with SPC 180 ft Seated L knee flexion 2x10 with slider Supine leg lengtheners towel under heel 2x10  Standing heel/toe raises x 20 Standing hip abduction x 10 BLE Standing marching BLE x 10  Standing hip extension BLE x 10 Standing L knee flexion stretch on 8 inch step 2x10  Vaso to L knee 34 deg compression med x 6 min 05/20/24 Nustep L2x17min UE/LE Standing L knee flexion 4 inch 2x10 Standing heel raises from 4 inch step x 12 Supine QS with therapist hand under knee 2x8 Supine heel slides with strap  2x10 Longsitting gastroc stretch with strap 2x30 sec Assisted SLR in supine x 10  05/15/24 Therapeutic Exercise: To improve strength, ROM, and flexibility.  Nustep L2x59min UE/LE Seated heel slides L knee 2x10 Supine heel slides feet on peanut ball 2x10 with strap Supine AP legs elevated on peanut ball x 20  Gait Training: To normalize gait pattern and improve safety.  With RW- 3 laps around clinic 270 ft- cues for heel strike, knee flexion on swing  Neuromuscular Re-Ed: To improve coordination and proprioception.  Standing LLE while holding onto walker: Marching 2 x 10 Hip abduction 2 x 10 Hip extension 2 x 10 HS curl 2 x 10 Seated LLE SAQ knee on small ball 2x10  MODALITIES: Vasopneumatic ice x 10' to L knee at minimal compression x 34 degrees 05/13/24 SELF CARE: Provided education on PT POC progression and on post-surgical precautions.initial HEP  MODALITIES: Vasopneumatic ice x 13' to L knee at minimal compression x 34 degrees  PATIENT EDUCATION:  Education details: PT eval findings, anticipated POC, and initial HEP  Person educated: Patient Education method: Explanation, Demonstration, Verbal cues, Tactile cues, Handouts, and MedBridge[employer] app access provided Education comprehension: verbalized understanding, verbal cues required, tactile cues required, and needs further  education  HOME EXERCISE PROGRAM: Access Code: [email or web address] URL: [email or web address] Date: 05/13/2024 Prepared by: Garnette Montclair  Exercises - Supine Ankle Pumps  - 1 x daily - 7 x weekly - 3 sets - 10 reps - Supine Quad Set  - 1 x daily - 7 x weekly - 3 sets - 10 reps - Supine Heel Slides  - 1 x daily - 7 x weekly - 3 sets - 10 reps - Supine Short Arc Quad  - 1 x daily - 7 x weekly - 3 sets - 10 reps - Supine Hip Abduction  - 1 x daily - 7 x weekly - 1-2 sets - 10 reps - Supine Straight Leg Raises  - 1 x daily - 7 x weekly - 1-2 sets - 10 reps - Standing Ankle Dorsiflexion with Table  Support  - 1 x daily - 7 x weekly - 3 sets - 10 reps - Standing Heel Raises  - 1 x daily - 7 x weekly - 3 sets - 10 reps - Standing March with Counter Support  - 1 x daily - 7 x weekly - 3 sets - 10 reps - Standing Hip Abduction with Counter Support  - 1 x daily - 7 x weekly - 3 sets - 10 reps - Standing Hip Extension with Counter Support  - 1 x daily - 7 x weekly - 3 sets - 10 reps - Standing Knee Flexion with Counter Support  - 1 x daily - 7 x weekly - 3 sets - 10 reps - Seated Knee Extension Stretch with Chair  - 1 x daily - 7 x weekly - 1 sets - 3 reps - 3-5 min hold - Seated Knee Flexion Stretch  - 1 x daily - 7 x weekly - 1 sets - 2-3 reps - 1 min hold   ASSESSMENT:  CLINICAL IMPRESSION: Pt continues to progress well with AROM of L knee. She is using cane for ambulation now and shows good gait pattern. Continues to be limited with knee extension for long periods of time. Able to continue progressing with more standing exercises today.    Verma Grothaus is a 58 y.o. female who was referred to physical therapy for evaluation and treatment for L TKA.  She is well known to us  from recent R TKA.  Surgery was 12/2, so she is 2 weeks post op.   Surprisingly she is not as stiff as we would have expected for 2 weeks without PT.   ROM is -5 to 85 degrees.    Pain is worse with end range flexion or extension of the L knee and with prolonged ambulation.  Patient has deficits in L knee ROM, LLE strength, walking,balance, edema, and pain which are interfering with ADLs and are impacting quality of life.  On LEFS patient scored 30/80 demonstrating moderate functional limitation.  Rooney will benefit from skilled PT to address above deficits to improve mobility and activity tolerance with decreased pain interference.   ---OF NOTE--the patient began to c/o calf pain at the end of vasopneumatic treatment today.   She relates the pain is posterolateral at the popliteal fossa and just distal to the fibular  head on the upper lateral calf.   She states she has been having this pain since surgery.  However, she reports her eliquis  was D/C early due to bruising so she has not been taking it.   Contacted Dr Benjiman office and informed them of the above information.  Patient will f/u  with their office tomorrow for bandage change/removal and is advised to discuss this pain with them.  She is advised to call 911 if she experiences any chest pain, SOB, diaphoresis, etc.    OBJECTIVE IMPAIRMENTS: difficulty walking, decreased ROM, decreased strength, increased edema, and pain.   ACTIVITY LIMITATIONS: carrying, lifting, bending, standing, squatting, stairs, and locomotion level  PARTICIPATION LIMITATIONS: meal prep, cleaning, laundry, driving, shopping, and community activity  PERSONAL FACTORS: Age and 1-2 comorbidities: DM, HTN, bronchitis, R TKA are also affecting patient's functional outcome.   REHAB POTENTIAL: Good  CLINICAL DECISION MAKING: Evolving/moderate complexity  EVALUATION COMPLEXITY: Moderate   GOALS: Goals reviewed with patient? Yes  SHORT TERM GOALS: Target date: 06/10/2024   Patient will be independent with initial HEP. Baseline: 100% PT assist required for correct completion Goal status: INITIAL  2.  Patient will report at least 25% improvement in L knee pain to improve QOL. Baseline: 8/10 worst Goal status: INITIAL  3.  Patient will ambulate with a SPC or no device indoors on level ground independently with good balance Baseline: walker dependent with SBA  Goal status: INITIAL   LONG TERM GOALS: Target date: 07/08/2024   Patient will be independent with advanced/ongoing HEP to improve outcomes and carryover.  Baseline: no advanced HEP yet Goal status: INITIAL  2.  Patient will report at least 50-75% improvement in L knee pain to improve QOL. Baseline: 8/10 worst Goal status: INITIAL  3.  Patient will demonstrate improved L knee AROM to >/= 0-130 deg to allow for normal  gait and stair mechanics. Baseline: Refer to above LE ROM table Goal status: INITIAL  4.  Patient will demonstrate improved LLE strength to >/= 5/5 for improved stability and ease of mobility. Baseline: Refer to above LE MMT table Goal status: INITIAL  5.  Patient will be able to ambulate 30' with no device and normal gait pattern without increased pain to access community.  Baseline: 5 min Goal status: INITIAL  6. Patient will be able to ascend/descend stairs with 1 HR and reciprocal step pattern safely to access home and community.  Baseline: 1 step at a time holding rail leading with RLE only going up/LLE only going down Goal status: INITIAL  7.  Patient will report >/= 50/80 on LEFS (MCID = 9 pts) to demonstrate improved functional ability. Baseline: 30/80 Goal status: INITIAL  8.  Patient will demonstrate at least 19/24 on DGI to decrease risk of falls. Baseline: TBD Goal status: INITIAL   9.  Patient will improve on TUG test to </= 14 sec to reduce fall risk Baseline: 25.61 sec Goal status: INITIAL  10.  Patient will improve on 5X STS to </= to 12 sec to demonstrate improved glut/quad strength for independence with transfers from low seating surfaces   PLAN:  PT FREQUENCY: 1-2x/week  PT DURATION: 8 weeks  PLANNED INTERVENTIONS: 97750- Physical Performance Testing, 97110-Therapeutic exercises, 97530- Therapeutic activity, 97112- Neuromuscular re-education, 97535- Self Care, [id number]- Manual therapy, [id number]- Electrical stimulation (unattended), 97016- Vasopneumatic device, 20560 (1-2 muscles), 20561 (3+ muscles)- Dry Needling, Patient/Family education, Taping, Joint mobilization, Cryotherapy, and Moist heat  PLAN FOR NEXT SESSION: progress ROM, strength, gait, balance, control edema with vasopneumatic ice   Tanganyika Bowlds L Savannha Welle, PTA 05/22/2024, 11:01 AM  "

## 2024-05-28 ENCOUNTER — Ambulatory Visit: Admitting: Rehabilitation

## 2024-05-28 ENCOUNTER — Encounter: Payer: Self-pay | Admitting: Rehabilitation

## 2024-05-28 DIAGNOSIS — R262 Difficulty in walking, not elsewhere classified: Secondary | ICD-10-CM

## 2024-05-28 DIAGNOSIS — M25561 Pain in right knee: Secondary | ICD-10-CM | POA: Diagnosis not present

## 2024-05-28 DIAGNOSIS — M25661 Stiffness of right knee, not elsewhere classified: Secondary | ICD-10-CM

## 2024-05-28 DIAGNOSIS — M6281 Muscle weakness (generalized): Secondary | ICD-10-CM

## 2024-05-28 DIAGNOSIS — R600 Localized edema: Secondary | ICD-10-CM

## 2024-05-28 NOTE — Therapy (Signed)
 " OUTPATIENT PHYSICAL THERAPY LOWER EXTREMITY TREATMENT   Patient Name: Ann Foster MRN: [id number] DOB:1965-06-29, 58 y.o., female Today's Date: 05/28/2024   END OF SESSION:  PT End of Session - 05/28/24 1407     Visit Number 5    Date for Recertification  07/08/24    PT Start Time 1404    PT Stop Time 1500    PT Time Calculation (min) 56 min             Past Medical History:  Diagnosis Date   Ankle fracture 2016   Right   Aortic atherosclerosis    trace calcific atherosclerosis aortic arch per ct neck done 12-22-17   Bronchitis    Diabetes mellitus without complication (HCC)    GERD (gastroesophageal reflux disease)    Hypertension    OA (osteoarthritis) of shoulder    left shoulder, both knees arthritis   Osteoarthritis, knee    Sleep apnea    wears CPAP   Past Surgical History:  Procedure Laterality Date   CHOLECYSTECTOMY N/A 04/15/2023   Procedure: LAPAROSCOPIC CHOLECYSTECTOMY WITH ICG DYE;  Surgeon: Ebbie Cough, MD;  Location: [facility];  Service: General;  Laterality: N/A;   COLONOSCOPY     TOTAL KNEE ARTHROPLASTY Right 11/20/2023   Procedure: ARTHROPLASTY, KNEE, TOTAL RIGHT;  Surgeon: Jerri Kay HERO, MD;  Location: [facility];  Service: Orthopedics;  Laterality: Right;   TOTAL KNEE ARTHROPLASTY Left 04/29/2024   Procedure: ARTHROPLASTY, KNEE, TOTAL;  Surgeon: Jerri Kay HERO, MD;  Location: [facility];  Service: Orthopedics;  Laterality: Left;   Patient Active Problem List   Diagnosis Date Noted   Status post total left knee replacement 04/29/2024   Primary osteoarthritis of left knee 02/22/2024   Status post total right knee replacement 11/20/2023   Snoring 06/07/2023   Acute cholecystitis 04/13/2023   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, with long-term current use of insulin  (HCC) 04/25/2022   Amblyopia of eye, right 07/27/2021   Corneal guttata of left eye 07/27/2021   Influenza vaccine  refused 07/29/2020   COVID-19 vaccine dose declined 07/29/2020   Vasomotor symptoms due to menopause 04/24/2020   Atrophic vaginitis 04/24/2020   Impingement syndrome of left shoulder 01/23/2018    PCP: Theotis Haze ORN, NP   REFERRING PROVIDER: Jule Ronal CROME, PA-C   REFERRING DIAG: [id number] (ICD-10-CM) - Hx of total knee replacement, left  THERAPY DIAG:  Acute pain of right knee  Stiffness of right knee, not elsewhere classified  Difficulty in walking, not elsewhere classified  Muscle weakness (generalized)  Localized edema  RATIONALE FOR EVALUATION AND TREATMENT: Rehabilitation  ONSET DATE: 04/30/24  NEXT MD VISIT: 05/13/24   SUBJECTIVE:  SUBJECTIVE STATEMENT:  Patient reports feeling sore because she had to stand outside for an hour waiting on transportation to pick her up and bring her to PT.   Rates pain 7/10  58 y/o patient referred to PT for L TKA by Dr Jerri on 04/30/24.  She is well known to us  from recent R TKA.   She is 2 weeks post op tomorrow.   Unclear why it took so long for her to get into see us .   She did not have any HH PT.   She reports she has been doing some of her home exercises from her R TKA on her own at home.  She has an Aquacel bandage in place over the L knee incision that is going to be removed/changed tomorrow when she f/u with Dr Jerri.  The bandage is loose, but it has not loosened to the inner square yet so the incision is not exposed.   Patient is advised that if it does come loose exposing the incision that she is call MD office and report.   She is also recommended not to shower or get the bandage wet until after MD appointment tomorrow, since it is just barely intact.     PAIN: Are you having pain? Yes: NPRS scale: 6/10 Pain location: L knee Pain  description: aching/sore always,  sharp at times Aggravating factors: prolonged walking, end range stretching into flexion/extension Relieving factors: rest, ice, muscle relaxers  PERTINENT HISTORY:  DM, HTN, bronchitis, R TKA  PRECAUTIONS: None  RED FLAGS: None  WEIGHT BEARING RESTRICTIONS: No  FALLS:  Has patient fallen in last 6 months? No  LIVING ENVIRONMENT: Lives with: lives with their family Lives in: House/apartment Stairs: Yes: External: 1 steps; none Has following equipment at home: Single point cane, Walker - 2 wheeled, and bed side commode  OCCUPATION: retired  PLOF: Independent with gait  PATIENT GOALS: get back 100%    OBJECTIVE: (objective measures completed at initial evaluation unless otherwise dated)  DIAGNOSTIC FINDINGS:  N/A  PATIENT SURVEYS:  LEFS  Extreme difficulty/unable (0), Quite a bit of difficulty (1), Moderate difficulty (2), Little difficulty (3), No difficulty (4) Survey date:  05/13/24  Any of your usual work, housework or school activities 1  2. Usual hobbies, recreational or sporting activities 1  3. Getting into/out of the bath 3  4. Walking between rooms 3  5. Putting on socks/shoes 3  6. Squatting  1  7. Lifting an object, like a bag of groceries from the floor 1  8. Performing light activities around your home 2  9. Performing heavy activities around your home 1  10. Getting into/out of a car 2  11. Walking 2 blocks 1  12. Walking 1 mile 0  13. Going up/down 10 stairs (1 flight) 2  14. Standing for 1 hour 3  15.  sitting for 1 hour 3  16. Running on even ground 0  17. Running on uneven ground 0  18. Making sharp turns while running fast 0  19. Hopping  0  20. Rolling over in bed 3  Score total:  30/80     COGNITION: Overall cognitive status: Within functional limits for tasks assessed    SENSATION: WFL  EDEMA:  Circumferential: L knee = 48.5 cm;  R knee = 44.0 cm  POSTURE:  No Significant postural  limitations  PALPATION: Tender to palpate around the L knee in general   LOWER EXTREMITY ROM:  Active ROM Right eval Left  eval  Hip flexion    Hip extension    Hip abduction    Hip adduction    Hip internal rotation    Hip external rotation    Knee flexion    Knee extension    Ankle dorsiflexion    Ankle plantarflexion    Ankle inversion    Ankle eversion     Passive ROM Right eval Left eval 05/15/24 05/22/24 AROM 05/28/24 LLE AROM  Hip flexion       Hip extension       Hip abduction       Hip adduction       Hip internal rotation       Hip external rotation       Knee flexion 90 85 98 103 95  Knee extension 0 -6 6  -5  Ankle dorsiflexion       Ankle plantarflexion       Ankle inversion       Ankle eversion       (Blank rows = not tested)  LOWER EXTREMITY MMT:  MMT Right eval Left eval  Hip flexion 5 4-  Hip extension    Hip abduction 4+ 3+  Hip adduction    Hip internal rotation 4+ 4-  Hip external rotation 5 4  Knee flexion 5 4  Knee extension 5 3+  Ankle dorsiflexion 5 4+  Ankle plantarflexion    Ankle inversion    Ankle eversion     (Blank rows = not tested)  LOWER EXTREMITY SPECIAL TESTS:  N/a  FUNCTIONAL TESTS:  5X STS = 26.49 sec TUG = 25.61 sec  GAIT: Distance walked: into clinic x 150' Assistive device utilized: [employer] - 2 wheeled Level of assistance: SBA Gait pattern: decreased step length- Left and decreased stance time- Left Comments: antalgic, decreased knee flexion in swing, decreased TKE at heel strike   TODAY'S TREATMENT:  05/28/24 THERAPEUTIC EXERCISE: To improve ROM.  Demonstration, verbal and tactile cues throughout for technique. Bike L0 partial ROM rocking back and forth x 7'  THERAPEUTIC ACTIVITIES: To improve functional performance.  Demonstration, verbal and tactile cues throughout for technique. LAQ 3# x 3/10 LLE Seated knee flexion RTB x 2/10 LLE Sit n slide flexion stretch x 1' x 3 LLE Supine SKTC flexion  stretch x 1' x 3 LLE Standing gastroc stretch on 1/2 foam roll x 1' x 2 LLE Standing soleus stretch on 1/2 foam roll x 1' x 2 LLE  NEUROMUSCULAR RE-EDUCATION: To improve proprioception and balance. On airex:  EC x 1'  Heel to toe rocking x 20 BLE  Minisquat x 2/10 BLE  Marching x 2/10 BLE  Step ups x 2/10 LLE MANUAL THERAPY: To promote improved flexibility utilizing myofascial release and scar mobilization. MFR to L IT band distally, cross friction massage to L knee incision  MODALITIES: Vasopneumatic ice x 13' to L knee at minimal compression x 34 degrees  05/22/24 Nustep L3x63min UE/LE Gait around clinic with SPC 180 ft Seated L knee flexion 2x10 with slider Supine leg lengtheners towel under heel 2x10  Standing heel/toe raises x 20 Standing hip abduction x 10 BLE Standing marching BLE x 10  Standing hip extension BLE x 10 Standing L knee flexion stretch on 8 inch step 2x10  Vaso to L knee 34 deg compression med x 6 min 05/20/24 Nustep L2x53min UE/LE Standing L knee flexion 4 inch 2x10 Standing heel raises from 4 inch step x 12 Supine QS with therapist hand under  knee 2x8 Supine heel slides with strap 2x10 Longsitting gastroc stretch with strap 2x30 sec Assisted SLR in supine x 10  05/15/24 Therapeutic Exercise: To improve strength, ROM, and flexibility.  Nustep L2x31min UE/LE Seated heel slides L knee 2x10 Supine heel slides feet on peanut ball 2x10 with strap Supine AP legs elevated on peanut ball x 20  Gait Training: To normalize gait pattern and improve safety.  With RW- 3 laps around clinic 270 ft- cues for heel strike, knee flexion on swing  Neuromuscular Re-Ed: To improve coordination and proprioception.  Standing LLE while holding onto walker: Marching 2 x 10 Hip abduction 2 x 10 Hip extension 2 x 10 HS curl 2 x 10 Seated LLE SAQ knee on small ball 2x10  MODALITIES: Vasopneumatic ice x 10' to L knee at minimal compression x 34 degrees 05/13/24 SELF  CARE: Provided education on PT POC progression and on post-surgical precautions.initial HEP  MODALITIES: Vasopneumatic ice x 13' to L knee at minimal compression x 34 degrees  PATIENT EDUCATION:  Education details: PT eval findings, anticipated POC, and initial HEP  Person educated: Patient Education method: Explanation, Demonstration, Verbal cues, Tactile cues, Handouts, and MedBridgeGO app access provided Education comprehension: verbalized understanding, verbal cues required, tactile cues required, and needs further education  HOME EXERCISE PROGRAM: Access Code: [email or web address] URL: [email or web address] Date: 05/28/2024 Prepared by: Garnette Montclair  Exercises - Supine Ankle Pumps  - 1 x daily - 7 x weekly - 3 sets - 10 reps - Supine Quad Set  - 1 x daily - 7 x weekly - 3 sets - 10 reps - Supine Heel Slides  - 1 x daily - 7 x weekly - 3 sets - 10 reps - Supine Short Arc Quad  - 1 x daily - 7 x weekly - 3 sets - 10 reps - Supine Straight Leg Raises  - 1 x daily - 7 x weekly - 1-2 sets - 10 reps - Standing Ankle Dorsiflexion with Table Support  - 1 x daily - 7 x weekly - 3 sets - 10 reps - Standing Heel Raises  - 1 x daily - 7 x weekly - 3 sets - 10 reps - Seated Knee Extension Stretch with Chair  - 1 x daily - 7 x weekly - 1 sets - 3 reps - 3-5 min hold - Seated Knee Flexion Stretch  - 1 x daily - 7 x weekly - 1 sets - 2-3 reps - 1 min hold - Prone Knee Flexion  - 1 x daily - 7 x weekly - 3 sets - 10 reps - Prone Hip Extension  - 1 x daily - 7 x weekly - 3 sets - 10 reps - Standing Gastroc Stretch on Foam 1/2 Roll  - 1 x daily - 7 x weekly - 1 sets - 2 reps - 1 min hold - Standing Soleus Stretch on Foam 1/2 Roll  - 1 x daily - 7 x weekly - 1 sets - 2 reps -  hold  ASSESSMENT:  CLINICAL IMPRESSION:  Patient is doing well and ambulating with SPC into clinic today.  She still exhibits some limping on the LLE, but it is improving.   Continuing to work on L knee ROM  which is difficult for the patient due to pain with end range stretching.  Edema is still moderate in the L knee which is limiting her flexion more.   She still has quite a bit of pain over the L lateral knee  and IT band.  Did some MFR today for pain relief.   PT remains necessary for ROM, strength, gait, balance, pain deficits.   Continue per POC  EVAL:  Maleya Leever is a 58 y.o. female who was referred to physical therapy for evaluation and treatment for L TKA.  She is well known to us  from recent R TKA.  Surgery was 12/2, so she is 2 weeks post op.   Surprisingly she is not as stiff as we would have expected for 2 weeks without PT.   ROM is -5 to 85 degrees.    Pain is worse with end range flexion or extension of the L knee and with prolonged ambulation.  Patient has deficits in L knee ROM, LLE strength, walking,balance, edema, and pain which are interfering with ADLs and are impacting quality of life.  On LEFS patient scored 30/80 demonstrating moderate functional limitation.  Salimata will benefit from skilled PT to address above deficits to improve mobility and activity tolerance with decreased pain interference.   ---OF NOTE--the patient began to c/o calf pain at the end of vasopneumatic treatment today.   She relates the pain is posterolateral at the popliteal fossa and just distal to the fibular head on the upper lateral calf.   She states she has been having this pain since surgery.  However, she reports her eliquis  was D/C early due to bruising so she has not been taking it.   Contacted Dr Benjiman office and informed them of the above information.  Patient will f/u with their office tomorrow for bandage change/removal and is advised to discuss this pain with them.  She is advised to call 911 if she experiences any chest pain, SOB, diaphoresis, etc.    OBJECTIVE IMPAIRMENTS: difficulty walking, decreased ROM, decreased strength, increased edema, and pain.   ACTIVITY LIMITATIONS: carrying, lifting,  bending, standing, squatting, stairs, and locomotion level  PARTICIPATION LIMITATIONS: meal prep, cleaning, laundry, driving, shopping, and community activity  PERSONAL FACTORS: Age and 1-2 comorbidities: DM, HTN, bronchitis, R TKA are also affecting patient's functional outcome.   REHAB POTENTIAL: Good  CLINICAL DECISION MAKING: Evolving/moderate complexity  EVALUATION COMPLEXITY: Moderate   GOALS: Goals reviewed with patient? Yes  SHORT TERM GOALS: Target date: 06/10/2024   Patient will be independent with initial HEP. Baseline: 100% PT assist required for correct completion 05/28/24:  patient can teach back 100% Goal status: MET  2.  Patient will report at least 25% improvement in L knee pain to improve QOL. Baseline: 8/10 worst 05/28/24:  7/10 Goal status: IN PROGRESS  3.  Patient will ambulate with a SPC or no device indoors on level ground independently with good balance Baseline: walker dependent with SBA  05/28/24:  SPC with mild limp Goal status: IN PROGRESS   LONG TERM GOALS: Target date: 07/08/2024   Patient will be independent with advanced/ongoing HEP to improve outcomes and carryover.  Baseline: no advanced HEP yet 05/28/24: advanced today  Goal status: IN PROGRESS  2.  Patient will report at least 50-75% improvement in L knee pain to improve QOL. Baseline: 8/10 worst Goal status: INITIAL  3.  Patient will demonstrate improved L knee AROM to >/= 0-130 deg to allow for normal gait and stair mechanics. Baseline: Refer to above LE ROM table 05/28/24:  see ROM table above; -4 to 95 deg Goal status: IN PROGRESS  4.  Patient will demonstrate improved LLE strength to >/= 5/5 for improved stability and ease of mobility. Baseline: Refer to  above LE MMT table Goal status: INITIAL  5.  Patient will be able to ambulate 30' with no device and normal gait pattern without increased pain to access community.  Baseline: 5 min Goal status: INITIAL  6. Patient will  be able to ascend/descend stairs with 1 HR and reciprocal step pattern safely to access home and community.  Baseline: 1 step at a time holding rail leading with RLE only going up/LLE only going down Goal status: INITIAL  7.  Patient will report >/= 50/80 on LEFS (MCID = 9 pts) to demonstrate improved functional ability. Baseline: 30/80 Goal status: INITIAL  8.  Patient will demonstrate at least 19/24 on DGI to decrease risk of falls. Baseline: TBD Goal status: INITIAL   9.  Patient will improve on TUG test to </= 14 sec to reduce fall risk Baseline: 25.61 sec Goal status: INITIAL  10.  Patient will improve on 5X STS to </= to 12 sec to demonstrate improved glut/quad strength for independence with transfers from low seating surfaces   Baseline:  26.49 sec   Goal status:  INITIAL  PLAN:  PT FREQUENCY: 1-2x/week  PT DURATION: 8 weeks  PLANNED INTERVENTIONS: 97750- Physical Performance Testing, 97110-Therapeutic exercises, 97530- Therapeutic activity, 97112- Neuromuscular re-education, 97535- Self Care, [id number]- Manual therapy, [id number]- Electrical stimulation (unattended), 97016- Vasopneumatic device, 20560 (1-2 muscles), 20561 (3+ muscles)- Dry Needling, Patient/Family education, Taping, Joint mobilization, Cryotherapy, and Moist heat  PLAN FOR NEXT SESSION: check TUG, DGI, 5x STS;  progress ROM, strength, gait, balance, control edema with vasopneumatic ice   Jewell Ryans, PT 05/28/2024, 9:14 PM  "

## 2024-05-31 ENCOUNTER — Ambulatory Visit: Attending: Physician Assistant | Admitting: Rehabilitation

## 2024-05-31 ENCOUNTER — Encounter: Payer: Self-pay | Admitting: Rehabilitation

## 2024-05-31 DIAGNOSIS — R600 Localized edema: Secondary | ICD-10-CM | POA: Diagnosis present

## 2024-05-31 DIAGNOSIS — M25561 Pain in right knee: Secondary | ICD-10-CM | POA: Diagnosis present

## 2024-05-31 DIAGNOSIS — M6281 Muscle weakness (generalized): Secondary | ICD-10-CM | POA: Insufficient documentation

## 2024-05-31 DIAGNOSIS — M25661 Stiffness of right knee, not elsewhere classified: Secondary | ICD-10-CM | POA: Insufficient documentation

## 2024-05-31 DIAGNOSIS — R262 Difficulty in walking, not elsewhere classified: Secondary | ICD-10-CM | POA: Diagnosis present

## 2024-05-31 NOTE — Therapy (Signed)
 " OUTPATIENT PHYSICAL THERAPY LOWER EXTREMITY TREATMENT   Patient Name: Ann Foster MRN: [id number] DOB:1965-10-02, 58 y.o., female Today's Date: 05/31/2024   END OF SESSION:  PT End of Session - 05/31/24 0950     Visit Number 6    Date for Recertification  07/08/24    PT Start Time 0941    PT Stop Time 1035    PT Time Calculation (min) 54 min              Past Medical History:  Diagnosis Date   Ankle fracture 2016   Right   Aortic atherosclerosis    trace calcific atherosclerosis aortic arch per ct neck done 12-22-17   Bronchitis    Diabetes mellitus without complication (HCC)    GERD (gastroesophageal reflux disease)    Hypertension    OA (osteoarthritis) of shoulder    left shoulder, both knees arthritis   Osteoarthritis, knee    Sleep apnea    wears CPAP   Past Surgical History:  Procedure Laterality Date   CHOLECYSTECTOMY N/A 04/15/2023   Procedure: LAPAROSCOPIC CHOLECYSTECTOMY WITH ICG DYE;  Surgeon: Ebbie Cough, MD;  Location: [facility];  Service: General;  Laterality: N/A;   COLONOSCOPY     TOTAL KNEE ARTHROPLASTY Right 11/20/2023   Procedure: ARTHROPLASTY, KNEE, TOTAL RIGHT;  Surgeon: Jerri Kay HERO, MD;  Location: [facility];  Service: Orthopedics;  Laterality: Right;   TOTAL KNEE ARTHROPLASTY Left 04/29/2024   Procedure: ARTHROPLASTY, KNEE, TOTAL;  Surgeon: Jerri Kay HERO, MD;  Location: [facility];  Service: Orthopedics;  Laterality: Left;   Patient Active Problem List   Diagnosis Date Noted   Status post total left knee replacement 04/29/2024   Primary osteoarthritis of left knee 02/22/2024   Status post total right knee replacement 11/20/2023   Snoring 06/07/2023   Acute cholecystitis 04/13/2023   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, with long-term current use of insulin  (HCC) 04/25/2022   Amblyopia of eye, right 07/27/2021   Corneal guttata of left eye 07/27/2021   Influenza vaccine  refused 07/29/2020   COVID-19 vaccine dose declined 07/29/2020   Vasomotor symptoms due to menopause 04/24/2020   Atrophic vaginitis 04/24/2020   Impingement syndrome of left shoulder 01/23/2018    PCP: Theotis Haze ORN, NP   REFERRING PROVIDER: Jule Ronal CROME, PA-C   REFERRING DIAG: [phone] (ICD-10-CM) - Hx of total knee replacement, left  THERAPY DIAG:  Acute pain of right knee  Stiffness of right knee, not elsewhere classified  Difficulty in walking, not elsewhere classified  Muscle weakness (generalized)  Localized edema  RATIONALE FOR EVALUATION AND TREATMENT: Rehabilitation  ONSET DATE: 04/30/24  NEXT MD VISIT: 05/13/24   SUBJECTIVE:  SUBJECTIVE STATEMENT:     EVAL:  59 y/o patient referred to PT for L TKA by Dr Jerri on 04/30/24.  She is well known to us  from recent R TKA.   She is 2 weeks post op tomorrow.   Unclear why it took so long for her to get into see us .   She did not have any HH PT.   She reports she has been doing some of her home exercises from her R TKA on her own at home.  She has an Aquacel bandage in place over the L knee incision that is going to be removed/changed tomorrow when she f/u with Dr Jerri.  The bandage is loose, but it has not loosened to the inner square yet so the incision is not exposed.   Patient is advised that if it does come loose exposing the incision that she is call MD office and report.   She is also recommended not to shower or get the bandage wet until after MD appointment tomorrow, since it is just barely intact.     PAIN: Are you having pain? Yes: NPRS scale: 6/10 Pain location: L knee Pain description: aching/sore always,  sharp at times Aggravating factors: prolonged walking, end range stretching into flexion/extension Relieving  factors: rest, ice, muscle relaxers  PERTINENT HISTORY:  DM, HTN, bronchitis, R TKA  PRECAUTIONS: None  RED FLAGS: None  WEIGHT BEARING RESTRICTIONS: No  FALLS:  Has patient fallen in last 6 months? No  LIVING ENVIRONMENT: Lives with: lives with their family Lives in: House/apartment Stairs: Yes: External: 1 steps; none Has following equipment at home: Single point cane, Walker - 2 wheeled, and bed side commode  OCCUPATION: retired  PLOF: Independent with gait  PATIENT GOALS: get back 100%    OBJECTIVE: (objective measures completed at initial evaluation unless otherwise dated)  DIAGNOSTIC FINDINGS:  N/A  PATIENT SURVEYS:  LEFS  Extreme difficulty/unable (0), Quite a bit of difficulty (1), Moderate difficulty (2), Little difficulty (3), No difficulty (4) Survey date:  05/13/24  Any of your usual work, housework or school activities 1  2. Usual hobbies, recreational or sporting activities 1  3. Getting into/out of the bath 3  4. Walking between rooms 3  5. Putting on socks/shoes 3  6. Squatting  1  7. Lifting an object, like a bag of groceries from the floor 1  8. Performing light activities around your home 2  9. Performing heavy activities around your home 1  10. Getting into/out of a car 2  11. Walking 2 blocks 1  12. Walking 1 mile 0  13. Going up/down 10 stairs (1 flight) 2  14. Standing for 1 hour 3  15.  sitting for 1 hour 3  16. Running on even ground 0  17. Running on uneven ground 0  18. Making sharp turns while running fast 0  19. Hopping  0  20. Rolling over in bed 3  Score total:  30/80     COGNITION: Overall cognitive status: Within functional limits for tasks assessed    SENSATION: WFL  EDEMA:  Circumferential: L knee = 48.5 cm;  R knee = 44.0 cm  POSTURE:  No Significant postural limitations  PALPATION: Tender to palpate around the L knee in general   LOWER EXTREMITY ROM:  Active ROM Right eval Left eval  Hip flexion     Hip extension    Hip abduction    Hip adduction    Hip internal rotation  Hip external rotation    Knee flexion    Knee extension    Ankle dorsiflexion    Ankle plantarflexion    Ankle inversion    Ankle eversion     Passive ROM Right eval Left eval 05/15/24 05/22/24 AROM 05/28/24 LLE AROM  Hip flexion       Hip extension       Hip abduction       Hip adduction       Hip internal rotation       Hip external rotation       Knee flexion 90 85 98 103 95  Knee extension 0 -6 6  -5  Ankle dorsiflexion       Ankle plantarflexion       Ankle inversion       Ankle eversion       (Blank rows = not tested)  LOWER EXTREMITY MMT:  MMT Right eval Left eval  Hip flexion 5 4-  Hip extension    Hip abduction 4+ 3+  Hip adduction    Hip internal rotation 4+ 4-  Hip external rotation 5 4  Knee flexion 5 4  Knee extension 5 3+  Ankle dorsiflexion 5 4+  Ankle plantarflexion    Ankle inversion    Ankle eversion     (Blank rows = not tested)  LOWER EXTREMITY SPECIAL TESTS:  N/a  FUNCTIONAL TESTS:  5X STS = 26.49 sec TUG = 25.61 sec  GAIT: Distance walked: into clinic x 150' Assistive device utilized: [employer] - 2 wheeled Level of assistance: SBA Gait pattern: decreased step length- Left and decreased stance time- Left Comments: antalgic, decreased knee flexion in swing, decreased TKE at heel strike   TODAY'S TREATMENT:  05/31/24 THERAPEUTIC EXERCISE: To improve ROM.  Demonstration, verbal and tactile cues throughout for technique. Bike L0 partial ROM rocking back and forth x 7'  THERAPEUTIC ACTIVITIES: To improve functional performance.  Demonstration, verbal and tactile cues throughout for technique. 4 step ups F x 10;  S x 10 LLE Aerobic step calf stretch x 1' Seated knee extension 5# x 2/10 LLE Seated knee flexion 10# x 2/10 LLE Seated L knee flexion stretch with contralateral overpressure from the RLE  NEUROMUSCULAR RE-EDUCATION: To improve strength,  proprioception, and balance. BOSU ball step stance lunges x 2/10 LLE Spanish squats black TB x 10 BLE  MANUAL THERAPY: To promote reduced pain and reduced edema utilizing kinesiotaping. KT tape to L knee chondromalacia pattern w/ 2 encircling I strips  05/28/24 THERAPEUTIC EXERCISE: To improve ROM.  Demonstration, verbal and tactile cues throughout for technique. Bike L0 partial ROM rocking back and forth x 7'  THERAPEUTIC ACTIVITIES: To improve functional performance.  Demonstration, verbal and tactile cues throughout for technique. LAQ 3# x 3/10 LLE Seated knee flexion RTB x 2/10 LLE Sit n slide flexion stretch x 1' x 3 LLE Supine SKTC flexion stretch x 1' x 3 LLE Standing gastroc stretch on 1/2 foam roll x 1' x 2 LLE Standing soleus stretch on 1/2 foam roll x 1' x 2 LLE  NEUROMUSCULAR RE-EDUCATION: To improve proprioception and balance. On airex:  EC x 1'  Heel to toe rocking x 20 BLE  Minisquat x 2/10 BLE  Marching x 2/10 BLE  Step ups x 2/10 LLE MANUAL THERAPY: To promote improved flexibility utilizing myofascial release and scar mobilization. MFR to L IT band distally, cross friction massage to L knee incision  MODALITIES: Vasopneumatic ice x 13' to L knee  at minimal compression x 34 degrees  05/22/24 Nustep L3x75min UE/LE Gait around clinic with SPC 180 ft Seated L knee flexion 2x10 with slider Supine leg lengtheners towel under heel 2x10  Standing heel/toe raises x 20 Standing hip abduction x 10 BLE Standing marching BLE x 10  Standing hip extension BLE x 10 Standing L knee flexion stretch on 8 inch step 2x10  Vaso to L knee 34 deg compression med x 6 min 05/20/24 Nustep L2x18min UE/LE Standing L knee flexion 4 inch 2x10 Standing heel raises from 4 inch step x 12 Supine QS with therapist hand under knee 2x8 Supine heel slides with strap 2x10 Longsitting gastroc stretch with strap 2x30 sec Assisted SLR in supine x 10  05/15/24 Therapeutic Exercise: To  improve strength, ROM, and flexibility.  Nustep L2x66min UE/LE Seated heel slides L knee 2x10 Supine heel slides feet on peanut ball 2x10 with strap Supine AP legs elevated on peanut ball x 20  Gait Training: To normalize gait pattern and improve safety.  With RW- 3 laps around clinic 270 ft- cues for heel strike, knee flexion on swing  Neuromuscular Re-Ed: To improve coordination and proprioception.  Standing LLE while holding onto walker: Marching 2 x 10 Hip abduction 2 x 10 Hip extension 2 x 10 HS curl 2 x 10 Seated LLE SAQ knee on small ball 2x10  MODALITIES: Vasopneumatic ice x 10' to L knee at minimal compression x 34 degrees 05/13/24 SELF CARE: Provided education on PT POC progression and on post-surgical precautions.initial HEP  MODALITIES: Vasopneumatic ice x 13' to L knee at minimal compression x 34 degrees  PATIENT EDUCATION:  Education details: PT eval findings, anticipated POC, and initial HEP  Person educated: Patient Education method: Explanation, Demonstration, Verbal cues, Tactile cues, Handouts, and MedBridge[employer] app access provided Education comprehension: verbalized understanding, verbal cues required, tactile cues required, and needs further education  HOME EXERCISE PROGRAM: Access Code: [email or web address] URL: [email or web address] Date: 05/28/2024 Prepared by: Garnette Montclair  Exercises - Supine Ankle Pumps  - 1 x daily - 7 x weekly - 3 sets - 10 reps - Supine Quad Set  - 1 x daily - 7 x weekly - 3 sets - 10 reps - Supine Heel Slides  - 1 x daily - 7 x weekly - 3 sets - 10 reps - Supine Short Arc Quad  - 1 x daily - 7 x weekly - 3 sets - 10 reps - Supine Straight Leg Raises  - 1 x daily - 7 x weekly - 1-2 sets - 10 reps - Standing Ankle Dorsiflexion with Table Support  - 1 x daily - 7 x weekly - 3 sets - 10 reps - Standing Heel Raises  - 1 x daily - 7 x weekly - 3 sets - 10 reps - Seated Knee Extension Stretch with Chair  - 1 x daily - 7 x weekly  - 1 sets - 3 reps - 3-5 min hold - Seated Knee Flexion Stretch  - 1 x daily - 7 x weekly - 1 sets - 2-3 reps - 1 min hold - Prone Knee Flexion  - 1 x daily - 7 x weekly - 3 sets - 10 reps - Prone Hip Extension  - 1 x daily - 7 x weekly - 3 sets - 10 reps - Standing Gastroc Stretch on Foam 1/2 Roll  - 1 x daily - 7 x weekly - 1 sets - 2 reps - 1 min hold - Standing Soleus Stretch on  Foam 1/2 Roll  - 1 x daily - 7 x weekly - 1 sets - 2 reps -  hold  ASSESSMENT:  CLINICAL IMPRESSION:    EVAL:  Ann Foster is a 59 y.o. female who was referred to physical therapy for evaluation and treatment for L TKA.  She is well known to us  from recent R TKA.  Surgery was 12/2, so she is 2 weeks post op.   Surprisingly she is not as stiff as we would have expected for 2 weeks without PT.   ROM is -5 to 85 degrees.    Pain is worse with end range flexion or extension of the L knee and with prolonged ambulation.  Patient has deficits in L knee ROM, LLE strength, walking,balance, edema, and pain which are interfering with ADLs and are impacting quality of life.  On LEFS patient scored 30/80 demonstrating moderate functional limitation.  Orabelle will benefit from skilled PT to address above deficits to improve mobility and activity tolerance with decreased pain interference.   ---OF NOTE--the patient began to c/o calf pain at the end of vasopneumatic treatment today.   She relates the pain is posterolateral at the popliteal fossa and just distal to the fibular head on the upper lateral calf.   She states she has been having this pain since surgery.  However, she reports her eliquis  was D/C early due to bruising so she has not been taking it.   Contacted Dr Benjiman office and informed them of the above information.  Patient will f/u with their office tomorrow for bandage change/removal and is advised to discuss this pain with them.  She is advised to call 911 if she experiences any chest pain, SOB, diaphoresis, etc.     OBJECTIVE IMPAIRMENTS: difficulty walking, decreased ROM, decreased strength, increased edema, and pain.   ACTIVITY LIMITATIONS: carrying, lifting, bending, standing, squatting, stairs, and locomotion level  PARTICIPATION LIMITATIONS: meal prep, cleaning, laundry, driving, shopping, and community activity  PERSONAL FACTORS: Age and 1-2 comorbidities: DM, HTN, bronchitis, R TKA are also affecting patient's functional outcome.   REHAB POTENTIAL: Good  CLINICAL DECISION MAKING: Evolving/moderate complexity  EVALUATION COMPLEXITY: Moderate   GOALS: Goals reviewed with patient? Yes  SHORT TERM GOALS: Target date: 06/10/2024   Patient will be independent with initial HEP. Baseline: 100% PT assist required for correct completion 05/28/24:  patient can teach back 100% Goal status: MET  2.  Patient will report at least 25% improvement in L knee pain to improve QOL. Baseline: 8/10 worst 05/28/24:  7/10 Goal status: IN PROGRESS  3.  Patient will ambulate with a SPC or no device indoors on level ground independently with good balance Baseline: walker dependent with SBA  05/28/24:  SPC with mild limp Goal status: IN PROGRESS   LONG TERM GOALS: Target date: 07/08/2024   Patient will be independent with advanced/ongoing HEP to improve outcomes and carryover.  Baseline: no advanced HEP yet 05/28/24: advanced today  Goal status: IN PROGRESS  2.  Patient will report at least 50-75% improvement in L knee pain to improve QOL. Baseline: 8/10 worst Goal status: INITIAL  3.  Patient will demonstrate improved L knee AROM to >/= 0-130 deg to allow for normal gait and stair mechanics. Baseline: Refer to above LE ROM table 05/28/24:  see ROM table above; -4 to 95 deg Goal status: IN PROGRESS  4.  Patient will demonstrate improved LLE strength to >/= 5/5 for improved stability and ease of mobility. Baseline: Refer  to above LE MMT table Goal status: INITIAL  5.  Patient will be able to  ambulate 30' with no device and normal gait pattern without increased pain to access community.  Baseline: 5 min Goal status: INITIAL  6. Patient will be able to ascend/descend stairs with 1 HR and reciprocal step pattern safely to access home and community.  Baseline: 1 step at a time holding rail leading with RLE only going up/LLE only going down Goal status: INITIAL  7.  Patient will report >/= 50/80 on LEFS (MCID = 9 pts) to demonstrate improved functional ability. Baseline: 30/80 Goal status: INITIAL  8.  Patient will demonstrate at least 19/24 on DGI to decrease risk of falls. Baseline: TBD Goal status: INITIAL   9.  Patient will improve on TUG test to </= 14 sec to reduce fall risk Baseline: 25.61 sec Goal status: INITIAL  10.  Patient will improve on 5X STS to </= to 12 sec to demonstrate improved glut/quad strength for independence with transfers from low seating surfaces   Baseline:  26.49 sec   Goal status:  INITIAL  PLAN:  PT FREQUENCY: 1-2x/week  PT DURATION: 8 weeks  PLANNED INTERVENTIONS: 97750- Physical Performance Testing, 97110-Therapeutic exercises, 97530- Therapeutic activity, 97112- Neuromuscular re-education, 97535- Self Care, 02859- Manual therapy, [id number]- Electrical stimulation (unattended), 97016- Vasopneumatic device, 20560 (1-2 muscles), 20561 (3+ muscles)- Dry Needling, Patient/Family education, Taping, Joint mobilization, Cryotherapy, and Moist heat  PLAN FOR NEXT SESSION: check TUG, DGI, 5x STS;  progress ROM, strength, gait, balance, control edema with vasopneumatic ice   Garvey Westcott, PT 05/31/2024, 10:25 AM  "

## 2024-06-03 ENCOUNTER — Ambulatory Visit: Admitting: Rehabilitation

## 2024-06-03 DIAGNOSIS — R262 Difficulty in walking, not elsewhere classified: Secondary | ICD-10-CM

## 2024-06-03 DIAGNOSIS — M25661 Stiffness of right knee, not elsewhere classified: Secondary | ICD-10-CM

## 2024-06-03 DIAGNOSIS — M6281 Muscle weakness (generalized): Secondary | ICD-10-CM

## 2024-06-03 DIAGNOSIS — M25561 Pain in right knee: Secondary | ICD-10-CM | POA: Diagnosis not present

## 2024-06-03 DIAGNOSIS — R600 Localized edema: Secondary | ICD-10-CM

## 2024-06-03 NOTE — Therapy (Signed)
 " OUTPATIENT PHYSICAL THERAPY LOWER EXTREMITY TREATMENT   Patient Name: Ann Foster MRN: [id number] DOB:13-Oct-1965, 58 y.o., female Today's Date: 06/03/2024   END OF SESSION:  PT End of Session - 06/03/24 1102     Visit Number 7    Date for Recertification  07/08/24    PT Start Time 1020    PT Stop Time 1112    PT Time Calculation (min) 52 min               Past Medical History:  Diagnosis Date   Ankle fracture 2016   Right   Aortic atherosclerosis    trace calcific atherosclerosis aortic arch per ct neck done 12-22-17   Bronchitis    Diabetes mellitus without complication (HCC)    GERD (gastroesophageal reflux disease)    Hypertension    OA (osteoarthritis) of shoulder    left shoulder, both knees arthritis   Osteoarthritis, knee    Sleep apnea    wears CPAP   Past Surgical History:  Procedure Laterality Date   CHOLECYSTECTOMY N/A 04/15/2023   Procedure: LAPAROSCOPIC CHOLECYSTECTOMY WITH ICG DYE;  Surgeon: Ebbie Cough, MD;  Location: [facility];  Service: General;  Laterality: N/A;   COLONOSCOPY     TOTAL KNEE ARTHROPLASTY Right 11/20/2023   Procedure: ARTHROPLASTY, KNEE, TOTAL RIGHT;  Surgeon: Jerri Kay HERO, MD;  Location: [facility];  Service: Orthopedics;  Laterality: Right;   TOTAL KNEE ARTHROPLASTY Left 04/29/2024   Procedure: ARTHROPLASTY, KNEE, TOTAL;  Surgeon: Jerri Kay HERO, MD;  Location: [facility];  Service: Orthopedics;  Laterality: Left;   Patient Active Problem List   Diagnosis Date Noted   Status post total left knee replacement 04/29/2024   Primary osteoarthritis of left knee 02/22/2024   Status post total right knee replacement 11/20/2023   Snoring 06/07/2023   Acute cholecystitis 04/13/2023   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, with long-term current use of insulin  (HCC) 04/25/2022   Amblyopia of eye, right 07/27/2021   Corneal guttata of left eye 07/27/2021   Influenza  vaccine refused 07/29/2020   COVID-19 vaccine dose declined 07/29/2020   Vasomotor symptoms due to menopause 04/24/2020   Atrophic vaginitis 04/24/2020   Impingement syndrome of left shoulder 01/23/2018    PCP: Theotis Haze ORN, NP   REFERRING PROVIDER: Jule Ronal CROME, PA-C   REFERRING DIAG: [phone] (ICD-10-CM) - Hx of total knee replacement, left  THERAPY DIAG:  Acute pain of right knee  Stiffness of right knee, not elsewhere classified  Difficulty in walking, not elsewhere classified  Muscle weakness (generalized)  Localized edema  RATIONALE FOR EVALUATION AND TREATMENT: Rehabilitation  ONSET DATE: 04/30/24  NEXT MD VISIT: 05/13/24   SUBJECTIVE:  SUBJECTIVE STATEMENT:  Pt reports her knee feeling sore on both sides of knee, itches as well same places   EVAL:  59 y/o patient referred to PT for L TKA by Dr Jerri on 04/30/24.  She is well known to us  from recent R TKA.   She is 2 weeks post op tomorrow.   Unclear why it took so long for her to get into see us .   She did not have any HH PT.   She reports she has been doing some of her home exercises from her R TKA on her own at home.  She has an Aquacel bandage in place over the L knee incision that is going to be removed/changed tomorrow when she f/u with Dr Jerri.  The bandage is loose, but it has not loosened to the inner square yet so the incision is not exposed.   Patient is advised that if it does come loose exposing the incision that she is call MD office and report.   She is also recommended not to shower or get the bandage wet until after MD appointment tomorrow, since it is just barely intact.     PAIN: Are you having pain? Yes: NPRS scale: 7/10 Pain location: L knee Pain description: aching/sore always,  sharp at  times Aggravating factors: prolonged walking, end range stretching into flexion/extension Relieving factors: rest, ice, muscle relaxers  PERTINENT HISTORY:  DM, HTN, bronchitis, R TKA  PRECAUTIONS: None  RED FLAGS: None  WEIGHT BEARING RESTRICTIONS: No  FALLS:  Has patient fallen in last 6 months? No  LIVING ENVIRONMENT: Lives with: lives with their family Lives in: House/apartment Stairs: Yes: External: 1 steps; none Has following equipment at home: Single point cane, Walker - 2 wheeled, and bed side commode  OCCUPATION: retired  PLOF: Independent with gait  PATIENT GOALS: get back 100%    OBJECTIVE: (objective measures completed at initial evaluation unless otherwise dated)  DIAGNOSTIC FINDINGS:  N/A  PATIENT SURVEYS:  LEFS  Extreme difficulty/unable (0), Quite a bit of difficulty (1), Moderate difficulty (2), Little difficulty (3), No difficulty (4) Survey date:  05/13/24  Any of your usual work, housework or school activities 1  2. Usual hobbies, recreational or sporting activities 1  3. Getting into/out of the bath 3  4. Walking between rooms 3  5. Putting on socks/shoes 3  6. Squatting  1  7. Lifting an object, like a bag of groceries from the floor 1  8. Performing light activities around your home 2  9. Performing heavy activities around your home 1  10. Getting into/out of a car 2  11. Walking 2 blocks 1  12. Walking 1 mile 0  13. Going up/down 10 stairs (1 flight) 2  14. Standing for 1 hour 3  15.  sitting for 1 hour 3  16. Running on even ground 0  17. Running on uneven ground 0  18. Making sharp turns while running fast 0  19. Hopping  0  20. Rolling over in bed 3  Score total:  30/80     COGNITION: Overall cognitive status: Within functional limits for tasks assessed    SENSATION: WFL  EDEMA:  Circumferential: L knee = 48.5 cm;  R knee = 44.0 cm  POSTURE:  No Significant postural limitations  PALPATION: Tender to palpate around  the L knee in general   LOWER EXTREMITY ROM:  Active ROM Right eval Left eval  Hip flexion    Hip extension  Hip abduction    Hip adduction    Hip internal rotation    Hip external rotation    Knee flexion    Knee extension    Ankle dorsiflexion    Ankle plantarflexion    Ankle inversion    Ankle eversion     Passive ROM Right eval Left eval 05/15/24 05/22/24 AROM 05/28/24 LLE AROM  Hip flexion       Hip extension       Hip abduction       Hip adduction       Hip internal rotation       Hip external rotation       Knee flexion 90 85 98 103 95  Knee extension 0 -6 6  -5  Ankle dorsiflexion       Ankle plantarflexion       Ankle inversion       Ankle eversion       (Blank rows = not tested)  LOWER EXTREMITY MMT:  MMT Right eval Left eval  Hip flexion 5 4-  Hip extension    Hip abduction 4+ 3+  Hip adduction    Hip internal rotation 4+ 4-  Hip external rotation 5 4  Knee flexion 5 4  Knee extension 5 3+  Ankle dorsiflexion 5 4+  Ankle plantarflexion    Ankle inversion    Ankle eversion     (Blank rows = not tested)  LOWER EXTREMITY SPECIAL TESTS:  N/a  FUNCTIONAL TESTS:  5X STS = 26.49 sec TUG = 25.61 sec  GAIT: Distance walked: into clinic x 150' Assistive device utilized: [employer] - 2 wheeled Level of assistance: SBA Gait pattern: decreased step length- Left and decreased stance time- Left Comments: antalgic, decreased knee flexion in swing, decreased TKE at heel strike   TODAY'S TREATMENT:  06/03/24 Bike Partial rev x 8 min Leg curls 15lb x 20 BLE; 5lb x 20; 10lb x 20 Leg extension 5lb 2x10 BLE  KT tape chondromalacia to L Knee- removed mid session, d/t pt noting restriction   TUG test- 13.48 with SPC 5xSTS- 16.88 hands braced on knees Functional squats at counter 2 x 10  MODALITIES: Vasopneumatic ice x 10' to L knee at minimal compression x 34 degrees  05/31/24 THERAPEUTIC EXERCISE: To improve ROM.  Demonstration, verbal and tactile  cues throughout for technique. Bike L0 partial ROM rocking back and forth x 7'  THERAPEUTIC ACTIVITIES: To improve functional performance.  Demonstration, verbal and tactile cues throughout for technique. 4 step ups F x 10;  S x 10 LLE Aerobic step calf stretch x 1' Seated knee extension 5# x 2/10 LLE Seated knee flexion 10# x 2/10 LLE Seated L knee flexion stretch with contralateral overpressure from the RLE  NEUROMUSCULAR RE-EDUCATION: To improve strength, proprioception, and balance. BOSU ball step stance lunges x 2/10 LLE Spanish squats black TB x 10 BLE  MANUAL THERAPY: To promote reduced pain and reduced edema utilizing kinesiotaping. KT tape to L knee chondromalacia pattern w/ 2 encircling I strips  05/28/24 THERAPEUTIC EXERCISE: To improve ROM.  Demonstration, verbal and tactile cues throughout for technique. Bike L0 partial ROM rocking back and forth x 7'  THERAPEUTIC ACTIVITIES: To improve functional performance.  Demonstration, verbal and tactile cues throughout for technique. LAQ 3# x 3/10 LLE Seated knee flexion RTB x 2/10 LLE Sit n slide flexion stretch x 1' x 3 LLE Supine SKTC flexion stretch x 1' x 3 LLE Standing gastroc stretch on 1/2 foam  roll x 1' x 2 LLE Standing soleus stretch on 1/2 foam roll x 1' x 2 LLE  NEUROMUSCULAR RE-EDUCATION: To improve proprioception and balance. On airex:  EC x 1'  Heel to toe rocking x 20 BLE  Minisquat x 2/10 BLE  Marching x 2/10 BLE  Step ups x 2/10 LLE MANUAL THERAPY: To promote improved flexibility utilizing myofascial release and scar mobilization. MFR to L IT band distally, cross friction massage to L knee incision  MODALITIES: Vasopneumatic ice x 13' to L knee at minimal compression x 34 degrees  05/22/24 Nustep L3x38min UE/LE Gait around clinic with SPC 180 ft Seated L knee flexion 2x10 with slider Supine leg lengtheners towel under heel 2x10  Standing heel/toe raises x 20 Standing hip abduction x 10  BLE Standing marching BLE x 10  Standing hip extension BLE x 10 Standing L knee flexion stretch on 8 inch step 2x10  Vaso to L knee 34 deg compression med x 6 min 05/20/24 Nustep L2x55min UE/LE Standing L knee flexion 4 inch 2x10 Standing heel raises from 4 inch step x 12 Supine QS with therapist hand under knee 2x8 Supine heel slides with strap 2x10 Longsitting gastroc stretch with strap 2x30 sec Assisted SLR in supine x 10  05/15/24 Therapeutic Exercise: To improve strength, ROM, and flexibility.  Nustep L2x74min UE/LE Seated heel slides L knee 2x10 Supine heel slides feet on peanut ball 2x10 with strap Supine AP legs elevated on peanut ball x 20  Gait Training: To normalize gait pattern and improve safety.  With RW- 3 laps around clinic 270 ft- cues for heel strike, knee flexion on swing  Neuromuscular Re-Ed: To improve coordination and proprioception.  Standing LLE while holding onto walker: Marching 2 x 10 Hip abduction 2 x 10 Hip extension 2 x 10 HS curl 2 x 10 Seated LLE SAQ knee on small ball 2x10  MODALITIES: Vasopneumatic ice x 10' to L knee at minimal compression x 34 degrees 05/13/24 SELF CARE: Provided education on PT POC progression and on post-surgical precautions.initial HEP  MODALITIES: Vasopneumatic ice x 13' to L knee at minimal compression x 34 degrees  PATIENT EDUCATION:  Education details: PT eval findings, anticipated POC, and initial HEP  Person educated: Patient Education method: Explanation, Demonstration, Verbal cues, Tactile cues, Handouts, and MedBridge[employer] app access provided Education comprehension: verbalized understanding, verbal cues required, tactile cues required, and needs further education  HOME EXERCISE PROGRAM: Access Code: [email or web address] URL: [email or web address] Date: 05/28/2024 Prepared by: Garnette Montclair  Exercises - Supine Ankle Pumps  - 1 x daily - 7 x weekly - 3 sets - 10 reps - Supine Quad Set  - 1 x daily  - 7 x weekly - 3 sets - 10 reps - Supine Heel Slides  - 1 x daily - 7 x weekly - 3 sets - 10 reps - Supine Short Arc Quad  - 1 x daily - 7 x weekly - 3 sets - 10 reps - Supine Straight Leg Raises  - 1 x daily - 7 x weekly - 1-2 sets - 10 reps - Standing Ankle Dorsiflexion with Table Support  - 1 x daily - 7 x weekly - 3 sets - 10 reps - Standing Heel Raises  - 1 x daily - 7 x weekly - 3 sets - 10 reps - Seated Knee Extension Stretch with Chair  - 1 x daily - 7 x weekly - 1 sets - 3 reps - 3-5 min hold - Seated Knee Flexion  Stretch  - 1 x daily - 7 x weekly - 1 sets - 2-3 reps - 1 min hold - Prone Knee Flexion  - 1 x daily - 7 x weekly - 3 sets - 10 reps - Prone Hip Extension  - 1 x daily - 7 x weekly - 3 sets - 10 reps - Standing Gastroc Stretch on Foam 1/2 Roll  - 1 x daily - 7 x weekly - 1 sets - 2 reps - 1 min hold - Standing Soleus Stretch on Foam 1/2 Roll  - 1 x daily - 7 x weekly - 1 sets - 2 reps -  hold  ASSESSMENT:  CLINICAL IMPRESSION:  Pt responded well to treatment. A little more pain today, we did try KT tape but this seemed to restrict her movement more than anything. Her 5xSTS and TUG scores has improved since the eval.   EVAL:  Ann Foster is a 59 y.o. female who was referred to physical therapy for evaluation and treatment for L TKA.  She is well known to us  from recent R TKA.  Surgery was 12/2, so she is 2 weeks post op.   Surprisingly she is not as stiff as we would have expected for 2 weeks without PT.   ROM is -5 to 85 degrees.    Pain is worse with end range flexion or extension of the L knee and with prolonged ambulation.  Patient has deficits in L knee ROM, LLE strength, walking,balance, edema, and pain which are interfering with ADLs and are impacting quality of life.  On LEFS patient scored 30/80 demonstrating moderate functional limitation.  Tashya will benefit from skilled PT to address above deficits to improve mobility and activity tolerance with decreased  pain interference.   ---OF NOTE--the patient began to c/o calf pain at the end of vasopneumatic treatment today.   She relates the pain is posterolateral at the popliteal fossa and just distal to the fibular head on the upper lateral calf.   She states she has been having this pain since surgery.  However, she reports her eliquis  was D/C early due to bruising so she has not been taking it.   Contacted Dr Benjiman office and informed them of the above information.  Patient will f/u with their office tomorrow for bandage change/removal and is advised to discuss this pain with them.  She is advised to call 911 if she experiences any chest pain, SOB, diaphoresis, etc.    OBJECTIVE IMPAIRMENTS: difficulty walking, decreased ROM, decreased strength, increased edema, and pain.   ACTIVITY LIMITATIONS: carrying, lifting, bending, standing, squatting, stairs, and locomotion level  PARTICIPATION LIMITATIONS: meal prep, cleaning, laundry, driving, shopping, and community activity  PERSONAL FACTORS: Age and 1-2 comorbidities: DM, HTN, bronchitis, R TKA are also affecting patient's functional outcome.   REHAB POTENTIAL: Good  CLINICAL DECISION MAKING: Evolving/moderate complexity  EVALUATION COMPLEXITY: Moderate   GOALS: Goals reviewed with patient? Yes  SHORT TERM GOALS: Target date: 06/10/2024   Patient will be independent with initial HEP. Baseline: 100% PT assist required for correct completion 05/28/24:  patient can teach back 100% Goal status: MET  2.  Patient will report at least 25% improvement in L knee pain to improve QOL. Baseline: 8/10 worst 05/28/24:  7/10 Goal status: IN PROGRESS  3.  Patient will ambulate with a SPC or no device indoors on level ground independently with good balance Baseline: walker dependent with SBA  05/28/24:  SPC with mild limp Goal status:  IN PROGRESS   LONG TERM GOALS: Target date: 07/08/2024   Patient will be independent with advanced/ongoing HEP to  improve outcomes and carryover.  Baseline: no advanced HEP yet 05/28/24: advanced today  Goal status: IN PROGRESS  2.  Patient will report at least 50-75% improvement in L knee pain to improve QOL. Baseline: 8/10 worst Goal status: INITIAL  3.  Patient will demonstrate improved L knee AROM to >/= 0-130 deg to allow for normal gait and stair mechanics. Baseline: Refer to above LE ROM table 05/28/24:  see ROM table above; -4 to 95 deg Goal status: IN PROGRESS  4.  Patient will demonstrate improved LLE strength to >/= 5/5 for improved stability and ease of mobility. Baseline: Refer to above LE MMT table Goal status: INITIAL  5.  Patient will be able to ambulate 30' with no device and normal gait pattern without increased pain to access community.  Baseline: 5 min Goal status: INITIAL  6. Patient will be able to ascend/descend stairs with 1 HR and reciprocal step pattern safely to access home and community.  Baseline: 1 step at a time holding rail leading with RLE only going up/LLE only going down Goal status: INITIAL  7.  Patient will report >/= 50/80 on LEFS (MCID = 9 pts) to demonstrate improved functional ability. Baseline: 30/80 Goal status: INITIAL  8.  Patient will demonstrate at least 19/24 on DGI to decrease risk of falls. Baseline: TBD Goal status: INITIAL   9.  Patient will improve on TUG test to </= 14 sec to reduce fall risk Baseline: 25.61 sec Goal status: INITIAL  10.  Patient will improve on 5X STS to </= to 12 sec to demonstrate improved glut/quad strength for independence with transfers from low seating surfaces   Baseline:  26.49 sec   Goal status:    PLAN:  PT FREQUENCY: 1-2x/week  PT DURATION: 8 weeks  PLANNED INTERVENTIONS: 97750- Physical Performance Testing, 97110-Therapeutic exercises, 97530- Therapeutic activity, 97112- Neuromuscular re-education, 97535- Self Care, [id number]- Manual therapy, [id number]- Electrical stimulation (unattended), [id number]-  Vasopneumatic device, 20560 (1-2 muscles), 20561 (3+ muscles)- Dry Needling, Patient/Family education, Taping, Joint mobilization, Cryotherapy, and Moist heat  PLAN FOR NEXT SESSION: check TUG, DGI, 5x STS;  progress ROM, strength, gait, balance, control edema with vasopneumatic ice   Raekwon Winkowski L Karlie Aung, PTA 06/03/2024, 11:18 AM  "

## 2024-06-06 ENCOUNTER — Ambulatory Visit

## 2024-06-06 ENCOUNTER — Encounter: Payer: Self-pay | Admitting: Rehabilitation

## 2024-06-06 DIAGNOSIS — M25561 Pain in right knee: Secondary | ICD-10-CM | POA: Diagnosis not present

## 2024-06-06 DIAGNOSIS — M25661 Stiffness of right knee, not elsewhere classified: Secondary | ICD-10-CM

## 2024-06-06 DIAGNOSIS — R262 Difficulty in walking, not elsewhere classified: Secondary | ICD-10-CM

## 2024-06-06 DIAGNOSIS — R600 Localized edema: Secondary | ICD-10-CM

## 2024-06-06 DIAGNOSIS — M6281 Muscle weakness (generalized): Secondary | ICD-10-CM

## 2024-06-06 NOTE — Therapy (Signed)
 " OUTPATIENT PHYSICAL THERAPY LOWER EXTREMITY TREATMENT   Patient Name: Ann Foster MRN: [id number] DOB:1965/09/01, 59 y.o., female Today's Date: 06/06/2024   END OF SESSION:  PT End of Session - 06/06/24 1007     Visit Number 8    Date for Recertification  07/08/24    PT Start Time 1000    PT Stop Time 1051    PT Time Calculation (min) 51 min                Past Medical History:  Diagnosis Date   Ankle fracture 2016   Right   Aortic atherosclerosis    trace calcific atherosclerosis aortic arch per ct neck done 12-22-17   Bronchitis    Diabetes mellitus without complication (HCC)    GERD (gastroesophageal reflux disease)    Hypertension    OA (osteoarthritis) of shoulder    left shoulder, both knees arthritis   Osteoarthritis, knee    Sleep apnea    wears CPAP   Past Surgical History:  Procedure Laterality Date   CHOLECYSTECTOMY N/A 04/15/2023   Procedure: LAPAROSCOPIC CHOLECYSTECTOMY WITH ICG DYE;  Surgeon: Ebbie Cough, MD;  Location: [facility];  Service: General;  Laterality: N/A;   COLONOSCOPY     TOTAL KNEE ARTHROPLASTY Right 11/20/2023   Procedure: ARTHROPLASTY, KNEE, TOTAL RIGHT;  Surgeon: Jerri Kay HERO, MD;  Location: [facility];  Service: Orthopedics;  Laterality: Right;   TOTAL KNEE ARTHROPLASTY Left 04/29/2024   Procedure: ARTHROPLASTY, KNEE, TOTAL;  Surgeon: Jerri Kay HERO, MD;  Location: [facility];  Service: Orthopedics;  Laterality: Left;   Patient Active Problem List   Diagnosis Date Noted   Status post total left knee replacement 04/29/2024   Primary osteoarthritis of left knee 02/22/2024   Status post total right knee replacement 11/20/2023   Snoring 06/07/2023   Acute cholecystitis 04/13/2023   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, with long-term current use of insulin  (HCC) 04/25/2022   Amblyopia of eye, right 07/27/2021   Corneal guttata of left eye 07/27/2021   Influenza  vaccine refused 07/29/2020   COVID-19 vaccine dose declined 07/29/2020   Vasomotor symptoms due to menopause 04/24/2020   Atrophic vaginitis 04/24/2020   Impingement syndrome of left shoulder 01/23/2018    PCP: Theotis Haze ORN, NP   REFERRING PROVIDER: Jule Ronal CROME, PA-C   REFERRING DIAG: [id number] (ICD-10-CM) - Hx of total knee replacement, left  THERAPY DIAG:  Acute pain of right knee  Stiffness of right knee, not elsewhere classified  Difficulty in walking, not elsewhere classified  Muscle weakness (generalized)  Localized edema  RATIONALE FOR EVALUATION AND TREATMENT: Rehabilitation  ONSET DATE: 04/30/24  NEXT MD VISIT: 05/13/24   SUBJECTIVE:  SUBJECTIVE STATEMENT:  Pt reports she is feeling better today   EVAL:  59 y/o patient referred to PT for L TKA by Dr Jerri on 04/30/24.  She is well known to us  from recent R TKA.   She is 2 weeks post op tomorrow.   Unclear why it took so long for her to get into see us .   She did not have any HH PT.   She reports she has been doing some of her home exercises from her R TKA on her own at home.  She has an Aquacel bandage in place over the L knee incision that is going to be removed/changed tomorrow when she f/u with Dr Jerri.  The bandage is loose, but it has not loosened to the inner square yet so the incision is not exposed.   Patient is advised that if it does come loose exposing the incision that she is call MD office and report.   She is also recommended not to shower or get the bandage wet until after MD appointment tomorrow, since it is just barely intact.     PAIN: Are you having pain? Yes: NPRS scale: 7/10 Pain location: L knee Pain description: aching/sore always,  sharp at times Aggravating factors: prolonged walking, end range  stretching into flexion/extension Relieving factors: rest, ice, muscle relaxers  PERTINENT HISTORY:  DM, HTN, bronchitis, R TKA  PRECAUTIONS: None  RED FLAGS: None  WEIGHT BEARING RESTRICTIONS: No  FALLS:  Has patient fallen in last 6 months? No  LIVING ENVIRONMENT: Lives with: lives with their family Lives in: House/apartment Stairs: Yes: External: 1 steps; none Has following equipment at home: Single point cane, Walker - 2 wheeled, and bed side commode  OCCUPATION: retired  PLOF: Independent with gait  PATIENT GOALS: get back 100%    OBJECTIVE: (objective measures completed at initial evaluation unless otherwise dated)  DIAGNOSTIC FINDINGS:  N/A  PATIENT SURVEYS:  LEFS  Extreme difficulty/unable (0), Quite a bit of difficulty (1), Moderate difficulty (2), Little difficulty (3), No difficulty (4) Survey date:  05/13/24  Any of your usual work, housework or school activities 1  2. Usual hobbies, recreational or sporting activities 1  3. Getting into/out of the bath 3  4. Walking between rooms 3  5. Putting on socks/shoes 3  6. Squatting  1  7. Lifting an object, like a bag of groceries from the floor 1  8. Performing light activities around your home 2  9. Performing heavy activities around your home 1  10. Getting into/out of a car 2  11. Walking 2 blocks 1  12. Walking 1 mile 0  13. Going up/down 10 stairs (1 flight) 2  14. Standing for 1 hour 3  15.  sitting for 1 hour 3  16. Running on even ground 0  17. Running on uneven ground 0  18. Making sharp turns while running fast 0  19. Hopping  0  20. Rolling over in bed 3  Score total:  30/80     COGNITION: Overall cognitive status: Within functional limits for tasks assessed    SENSATION: WFL  EDEMA:  Circumferential: L knee = 48.5 cm;  R knee = 44.0 cm  POSTURE:  No Significant postural limitations  PALPATION: Tender to palpate around the L knee in general   LOWER EXTREMITY ROM:  Active  ROM Right eval Left eval  Hip flexion    Hip extension    Hip abduction    Hip adduction  Hip internal rotation    Hip external rotation    Knee flexion    Knee extension    Ankle dorsiflexion    Ankle plantarflexion    Ankle inversion    Ankle eversion     Passive ROM Right eval Left eval 05/15/24 05/22/24 AROM 05/28/24 LLE AROM 06/06/24 L knee  Hip flexion        Hip extension        Hip abduction        Hip adduction        Hip internal rotation        Hip external rotation        Knee flexion 90 85 98 103 95 104 sitting   Knee extension 0 -6 6  -5 4 LAQ  Ankle dorsiflexion        Ankle plantarflexion        Ankle inversion        Ankle eversion        (Blank rows = not tested)  LOWER EXTREMITY MMT:  MMT Right eval Left eval  Hip flexion 5 4-  Hip extension    Hip abduction 4+ 3+  Hip adduction    Hip internal rotation 4+ 4-  Hip external rotation 5 4  Knee flexion 5 4  Knee extension 5 3+  Ankle dorsiflexion 5 4+  Ankle plantarflexion    Ankle inversion    Ankle eversion     (Blank rows = not tested)  LOWER EXTREMITY SPECIAL TESTS:  N/a  FUNCTIONAL TESTS:  5X STS = 26.49 sec TUG = 25.61 sec  GAIT: Distance walked: into clinic x 150' Assistive device utilized: [employer] - 2 wheeled Level of assistance: SBA Gait pattern: decreased step length- Left and decreased stance time- Left Comments: antalgic, decreased knee flexion in swing, decreased TKE at heel strike   TODAY'S TREATMENT:  06/06/24 Nustep L4x58min UE/LE Bike x 20 full revolutions backwards   [facility] PT Assessment - 06/06/24 0001       Standardized Balance Assessment   Standardized Balance Assessment Dynamic Gait Index      Dynamic Gait Index   Level Surface Mild Impairment    Change in Gait Speed Mild Impairment    Gait with Horizontal Head Turns Normal    Gait with Vertical Head Turns Normal    Gait and Pivot Turn Normal    Step Over Obstacle Mild Impairment    Step Around Obstacles  Normal    Steps Mild Impairment    Total Score 20         Step ups BLE 6' 2x10 Lateral step ups BLE 6' 2x10 Step ups BLE on foam with SLS for 3 sec x 10 Step up and over foam roll fwd and backward x 10 Measured AROM L knee    06/03/24 Bike Partial rev x 8 min Leg curls 15lb x 20 BLE; 5lb x 20; 10lb x 20 Leg extension 5lb 2x10 BLE  KT tape chondromalacia to L Knee- removed mid session, d/t pt noting restriction   TUG test- 13.48 with SPC 5xSTS- 16.88 hands braced on knees Functional squats at counter 2 x 10  MODALITIES: Vasopneumatic ice x 10' to L knee at minimal compression x 34 degrees  05/31/24 THERAPEUTIC EXERCISE: To improve ROM.  Demonstration, verbal and tactile cues throughout for technique. Bike L0 partial ROM rocking back and forth x 7'  THERAPEUTIC ACTIVITIES: To improve functional performance.  Demonstration, verbal and tactile cues throughout for technique. 4  step ups F x 10;  S x 10 LLE Aerobic step calf stretch x 1' Seated knee extension 5# x 2/10 LLE Seated knee flexion 10# x 2/10 LLE Seated L knee flexion stretch with contralateral overpressure from the RLE  NEUROMUSCULAR RE-EDUCATION: To improve strength, proprioception, and balance. BOSU ball step stance lunges x 2/10 LLE Spanish squats black TB x 10 BLE  MANUAL THERAPY: To promote reduced pain and reduced edema utilizing kinesiotaping. KT tape to L knee chondromalacia pattern w/ 2 encircling I strips  05/28/24 THERAPEUTIC EXERCISE: To improve ROM.  Demonstration, verbal and tactile cues throughout for technique. Bike L0 partial ROM rocking back and forth x 7'  THERAPEUTIC ACTIVITIES: To improve functional performance.  Demonstration, verbal and tactile cues throughout for technique. LAQ 3# x 3/10 LLE Seated knee flexion RTB x 2/10 LLE Sit n slide flexion stretch x 1' x 3 LLE Supine SKTC flexion stretch x 1' x 3 LLE Standing gastroc stretch on 1/2 foam roll x 1' x 2 LLE Standing soleus stretch  on 1/2 foam roll x 1' x 2 LLE  NEUROMUSCULAR RE-EDUCATION: To improve proprioception and balance. On airex:  EC x 1'  Heel to toe rocking x 20 BLE  Minisquat x 2/10 BLE  Marching x 2/10 BLE  Step ups x 2/10 LLE MANUAL THERAPY: To promote improved flexibility utilizing myofascial release and scar mobilization. MFR to L IT band distally, cross friction massage to L knee incision  MODALITIES: Vasopneumatic ice x 13' to L knee at minimal compression x 34 degrees  05/22/24 Nustep L3x60min UE/LE Gait around clinic with SPC 180 ft Seated L knee flexion 2x10 with slider Supine leg lengtheners towel under heel 2x10  Standing heel/toe raises x 20 Standing hip abduction x 10 BLE Standing marching BLE x 10  Standing hip extension BLE x 10 Standing L knee flexion stretch on 8 inch step 2x10  Vaso to L knee 34 deg compression med x 6 min 05/20/24 Nustep L2x48min UE/LE Standing L knee flexion 4 inch 2x10 Standing heel raises from 4 inch step x 12 Supine QS with therapist hand under knee 2x8 Supine heel slides with strap 2x10 Longsitting gastroc stretch with strap 2x30 sec Assisted SLR in supine x 10  05/15/24 Therapeutic Exercise: To improve strength, ROM, and flexibility.  Nustep L2x35min UE/LE Seated heel slides L knee 2x10 Supine heel slides feet on peanut ball 2x10 with strap Supine AP legs elevated on peanut ball x 20  Gait Training: To normalize gait pattern and improve safety.  With RW- 3 laps around clinic 270 ft- cues for heel strike, knee flexion on swing  Neuromuscular Re-Ed: To improve coordination and proprioception.  Standing LLE while holding onto walker: Marching 2 x 10 Hip abduction 2 x 10 Hip extension 2 x 10 HS curl 2 x 10 Seated LLE SAQ knee on small ball 2x10  MODALITIES: Vasopneumatic ice x 10' to L knee at minimal compression x 34 degrees 05/13/24 SELF CARE: Provided education on PT POC progression and on post-surgical precautions.initial  HEP  MODALITIES: Vasopneumatic ice x 13' to L knee at minimal compression x 34 degrees  PATIENT EDUCATION:  Education details: PT eval findings, anticipated POC, and initial HEP  Person educated: Patient Education method: Explanation, Demonstration, Verbal cues, Tactile cues, Handouts, and MedBridgeGO app access provided Education comprehension: verbalized understanding, verbal cues required, tactile cues required, and needs further education  HOME EXERCISE PROGRAM: Access Code: [email or web address] URL: [email or web address] Date: 05/28/2024 Prepared by: Garnette Montclair  Exercises -  Supine Ankle Pumps  - 1 x daily - 7 x weekly - 3 sets - 10 reps - Supine Quad Set  - 1 x daily - 7 x weekly - 3 sets - 10 reps - Supine Heel Slides  - 1 x daily - 7 x weekly - 3 sets - 10 reps - Supine Short Arc Quad  - 1 x daily - 7 x weekly - 3 sets - 10 reps - Supine Straight Leg Raises  - 1 x daily - 7 x weekly - 1-2 sets - 10 reps - Standing Ankle Dorsiflexion with Table Support  - 1 x daily - 7 x weekly - 3 sets - 10 reps - Standing Heel Raises  - 1 x daily - 7 x weekly - 3 sets - 10 reps - Seated Knee Extension Stretch with Chair  - 1 x daily - 7 x weekly - 1 sets - 3 reps - 3-5 min hold - Seated Knee Flexion Stretch  - 1 x daily - 7 x weekly - 1 sets - 2-3 reps - 1 min hold - Prone Knee Flexion  - 1 x daily - 7 x weekly - 3 sets - 10 reps - Prone Hip Extension  - 1 x daily - 7 x weekly - 3 sets - 10 reps - Standing Gastroc Stretch on Foam 1/2 Roll  - 1 x daily - 7 x weekly - 1 sets - 2 reps - 1 min hold - Standing Soleus Stretch on Foam 1/2 Roll  - 1 x daily - 7 x weekly - 1 sets - 2 reps -  hold  ASSESSMENT:  CLINICAL IMPRESSION:  Pt shows improved L knee AROM today both directions. DGI does not indicate fall risk. She does show functional weakness with stair navigation. Focused strengthening on stair climbing as well as proprioceptive exercises for balance.  EVAL:  Ann Foster is a 59 y.o. female who was referred to physical therapy for evaluation and treatment for L TKA.  She is well known to us  from recent R TKA.  Surgery was 12/2, so she is 2 weeks post op.   Surprisingly she is not as stiff as we would have expected for 2 weeks without PT.   ROM is -5 to 85 degrees.    Pain is worse with end range flexion or extension of the L knee and with prolonged ambulation.  Patient has deficits in L knee ROM, LLE strength, walking,balance, edema, and pain which are interfering with ADLs and are impacting quality of life.  On LEFS patient scored 30/80 demonstrating moderate functional limitation.  Ann Foster will benefit from skilled PT to address above deficits to improve mobility and activity tolerance with decreased pain interference.   ---OF NOTE--the patient began to c/o calf pain at the end of vasopneumatic treatment today.   She relates the pain is posterolateral at the popliteal fossa and just distal to the fibular head on the upper lateral calf.   She states she has been having this pain since surgery.  However, she reports her eliquis  was D/C early due to bruising so she has not been taking it.   Contacted Dr Benjiman office and informed them of the above information.  Patient will f/u with their office tomorrow for bandage change/removal and is advised to discuss this pain with them.  She is advised to call 911 if she experiences any chest pain, SOB, diaphoresis, etc.    OBJECTIVE IMPAIRMENTS: difficulty walking, decreased ROM, decreased  strength, increased edema, and pain.   ACTIVITY LIMITATIONS: carrying, lifting, bending, standing, squatting, stairs, and locomotion level  PARTICIPATION LIMITATIONS: meal prep, cleaning, laundry, driving, shopping, and community activity  PERSONAL FACTORS: Age and 1-2 comorbidities: DM, HTN, bronchitis, R TKA are also affecting patient's functional outcome.   REHAB POTENTIAL: Good  CLINICAL DECISION MAKING: Evolving/moderate  complexity  EVALUATION COMPLEXITY: Moderate   GOALS: Goals reviewed with patient? Yes  SHORT TERM GOALS: Target date: 06/10/2024   Patient will be independent with initial HEP. Baseline: 100% PT assist required for correct completion 05/28/24:  patient can teach back 100% Goal status: MET  2.  Patient will report at least 25% improvement in L knee pain to improve QOL. Baseline: 8/10 worst 05/28/24:  7/10 Goal status: IN PROGRESS  3.  Patient will ambulate with a SPC or no device indoors on level ground independently with good balance Baseline: walker dependent with SBA  05/28/24:  SPC with mild limp Goal status: IN PROGRESS   LONG TERM GOALS: Target date: 07/08/2024   Patient will be independent with advanced/ongoing HEP to improve outcomes and carryover.  Baseline: no advanced HEP yet 05/28/24: advanced today  Goal status: IN PROGRESS  2.  Patient will report at least 50-75% improvement in L knee pain to improve QOL. Baseline: 8/10 worst Goal status: INITIAL  3.  Patient will demonstrate improved L knee AROM to >/= 0-130 deg to allow for normal gait and stair mechanics. Baseline: Refer to above LE ROM table 05/28/24:  see ROM table above; -4 to 95 deg Goal status: IN PROGRESS  4.  Patient will demonstrate improved LLE strength to >/= 5/5 for improved stability and ease of mobility. Baseline: Refer to above LE MMT table Goal status: INITIAL  5.  Patient will be able to ambulate 30' with no device and normal gait pattern without increased pain to access community.  Baseline: 5 min Goal status: INITIAL  6. Patient will be able to ascend/descend stairs with 1 HR and reciprocal step pattern safely to access home and community.  Baseline: 1 step at a time holding rail leading with RLE only going up/LLE only going down Goal status: INITIAL  7.  Patient will report >/= 50/80 on LEFS (MCID = 9 pts) to demonstrate improved functional ability. Baseline: 30/80 Goal status:  INITIAL  8.  Patient will demonstrate at least 19/24 on DGI to decrease risk of falls. Baseline: TBD Goal status: MET- 06/06/24   9.  Patient will improve on TUG test to </= 14 sec to reduce fall risk Baseline: 25.61 sec Goal status: INITIAL  10.  Patient will improve on 5X STS to </= to 12 sec to demonstrate improved glut/quad strength for independence with transfers from low seating surfaces   Baseline:  26.49 sec   Goal status:  progressing- 06/06/24  PLAN:  PT FREQUENCY: 1-2x/week  PT DURATION: 8 weeks  PLANNED INTERVENTIONS: 97750- Physical Performance Testing, 97110-Therapeutic exercises, 97530- Therapeutic activity, 97112- Neuromuscular re-education, 97535- Self Care, 02859- Manual therapy, [id number]- Electrical stimulation (unattended), 97016- Vasopneumatic device, 20560 (1-2 muscles), 20561 (3+ muscles)- Dry Needling, Patient/Family education, Taping, Joint mobilization, Cryotherapy, and Moist heat  PLAN FOR NEXT SESSION: DGI;  progress ROM, strength, gait, balance, control edema with vasopneumatic ice   Layla Kesling L Ahsley Attwood, PTA 06/06/2024, 11:02 AM  "

## 2024-06-08 NOTE — Therapy (Signed)
 " OUTPATIENT PHYSICAL THERAPY LOWER EXTREMITY TREATMENT / PROGRESS NOTE   Progress Note Reporting Period 05/13/25 to 06/10/2024  See note below for Objective Data and Assessment of Progress/Goals.      Patient Name: Ann Foster MRN: [id number] DOB:06/24/1965, 59 y.o., female Today's Date: 06/10/2024   END OF SESSION:  PT End of Session - 06/10/24 0850     Visit Number 9    Date for Recertification  07/08/24    PT Start Time 0848    PT Stop Time 0932    PT Time Calculation (min) 44 min    Activity Tolerance Patient tolerated treatment well;No increased pain    Behavior During Therapy [facility] for tasks assessed/performed                 Past Medical History:  Diagnosis Date   Ankle fracture 2016   Right   Aortic atherosclerosis    trace calcific atherosclerosis aortic arch per ct neck done 12-22-17   Bronchitis    Diabetes mellitus without complication (HCC)    GERD (gastroesophageal reflux disease)    Hypertension    OA (osteoarthritis) of shoulder    left shoulder, both knees arthritis   Osteoarthritis, knee    Sleep apnea    wears CPAP   Past Surgical History:  Procedure Laterality Date   CHOLECYSTECTOMY N/A 04/15/2023   Procedure: LAPAROSCOPIC CHOLECYSTECTOMY WITH ICG DYE;  Surgeon: Ebbie Cough, MD;  Location: [facility];  Service: General;  Laterality: N/A;   COLONOSCOPY     TOTAL KNEE ARTHROPLASTY Right 11/20/2023   Procedure: ARTHROPLASTY, KNEE, TOTAL RIGHT;  Surgeon: Jerri Kay HERO, MD;  Location: [facility];  Service: Orthopedics;  Laterality: Right;   TOTAL KNEE ARTHROPLASTY Left 04/29/2024   Procedure: ARTHROPLASTY, KNEE, TOTAL;  Surgeon: Jerri Kay HERO, MD;  Location: [facility];  Service: Orthopedics;  Laterality: Left;   Patient Active Problem List   Diagnosis Date Noted   Status post total left knee replacement 04/29/2024   Primary osteoarthritis of left knee 02/22/2024   Status post total right knee replacement 11/20/2023   Snoring 06/07/2023    Acute cholecystitis 04/13/2023   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, with long-term current use of insulin  (HCC) 04/25/2022   Amblyopia of eye, right 07/27/2021   Corneal guttata of left eye 07/27/2021   Influenza vaccine refused 07/29/2020   COVID-19 vaccine dose declined 07/29/2020   Vasomotor symptoms due to menopause 04/24/2020   Atrophic vaginitis 04/24/2020   Impingement syndrome of left shoulder 01/23/2018    PCP: Theotis Haze ORN, NP   REFERRING PROVIDER: Jule Ronal CROME, PA-C   REFERRING DIAG: [phone] (ICD-10-CM) - Hx of total knee replacement, left  THERAPY DIAG:  Acute pain of right knee  Stiffness of right knee, not elsewhere classified  Difficulty in walking, not elsewhere classified  Muscle weakness (generalized)  Localized edema  RATIONALE FOR EVALUATION AND TREATMENT: Rehabilitation  ONSET DATE: 04/30/24  NEXT MD VISIT: 05/13/24   SUBJECTIVE:  SUBJECTIVE STATEMENT:  Patient reports feeling ok today.   Rates pain today 2/10 today in the L knee.  Denies any falls or med changes.  EVAL:  59 y/o patient referred to PT for L TKA by Dr Jerri on 04/30/24.  She is well known to us  from recent R TKA.   She is 2 weeks post op tomorrow.   Unclear why it took so long for her to get into see us .   She did not have any HH PT.   She reports she has been doing some of her home exercises from her R TKA on her own at home.  She has an Aquacel bandage in place over the L knee incision that is going to be removed/changed tomorrow when she f/u with Dr Jerri.  The bandage is loose, but it has not loosened to the inner square yet so the incision is not exposed.   Patient is advised that if it does come loose exposing the  incision that she is call MD office and report.   She is also recommended not to shower or get the bandage wet until after MD appointment tomorrow, since it is just barely intact.     PAIN: Are you having pain? Yes: NPRS scale: 7/10 Pain location: L knee Pain description: aching/sore always,  sharp at times Aggravating factors: prolonged walking, end range stretching into flexion/extension Relieving factors: rest, ice, muscle relaxers  PERTINENT HISTORY:  DM, HTN, bronchitis, R TKA  PRECAUTIONS: None  RED FLAGS: None  WEIGHT BEARING RESTRICTIONS: No  FALLS:  Has patient fallen in last 6 months? No  LIVING ENVIRONMENT: Lives with: lives with their family Lives in: House/apartment Stairs: Yes: External: 1 steps; none Has following equipment at home: Single point cane, Walker - 2 wheeled, and bed side commode  OCCUPATION: retired  PLOF: Independent with gait  PATIENT GOALS: get back 100%    OBJECTIVE: (objective measures completed at initial evaluation unless otherwise dated)  DIAGNOSTIC FINDINGS:  N/A  PATIENT SURVEYS:  LEFS  Extreme difficulty/unable (0), Quite a bit of difficulty (1), Moderate difficulty (2), Little difficulty (3), No difficulty (4) Survey date:  05/13/24  Any of your usual work, housework or school activities 1  2. Usual hobbies, recreational or sporting activities 1  3. Getting into/out of the bath 3  4. Walking between rooms 3  5. Putting on socks/shoes 3  6. Squatting  1  7. Lifting an object, like a bag of groceries from the floor 1  8. Performing light activities around your home 2  9. Performing heavy activities around your home 1  10. Getting into/out of a car 2  11. Walking 2 blocks 1  12. Walking 1 mile 0  13. Going up/down 10 stairs (1 flight) 2  14. Standing for 1 hour 3  15.  sitting for 1 hour 3  16. Running on even ground 0  17. Running on uneven ground 0  18. Making sharp turns while running fast 0  19. Hopping  0  20.  Rolling over in bed 3  Score total:  30/80     COGNITION: Overall cognitive status: Within functional limits for tasks assessed    SENSATION: WFL  EDEMA:  Circumferential: L knee = 48.5 cm;  R knee = 44.0 cm  POSTURE:  No Significant postural limitations  PALPATION: Tender to palpate around the L knee in general   LOWER EXTREMITY ROM:  Active ROM Right eval Left eval  Hip flexion  Hip extension    Hip abduction    Hip adduction    Hip internal rotation    Hip external rotation    Knee flexion    Knee extension    Ankle dorsiflexion    Ankle plantarflexion    Ankle inversion    Ankle eversion     Passive ROM Right eval Left eval 05/15/24 05/22/24 AROM 05/28/24 LLE AROM 06/06/24 L knee L knee 06/10/24  Hip flexion         Hip extension         Hip abduction         Hip adduction         Hip internal rotation         Hip external rotation         Knee flexion 90 85 98 103 95 104 sitting  100 sit and scoot  Knee extension 0 -6 6  -5 4 LAQ   Ankle dorsiflexion         Ankle plantarflexion         Ankle inversion         Ankle eversion         (Blank rows = not tested)  LOWER EXTREMITY MMT:  MMT Right eval Left eval  Hip flexion 5 4-  Hip extension    Hip abduction 4+ 3+  Hip adduction    Hip internal rotation 4+ 4-  Hip external rotation 5 4  Knee flexion 5 4  Knee extension 5 3+  Ankle dorsiflexion 5 4+  Ankle plantarflexion    Ankle inversion    Ankle eversion     (Blank rows = not tested)  LOWER EXTREMITY SPECIAL TESTS:  N/a  FUNCTIONAL TESTS:  5X STS = 26.49 sec TUG = 25.61 sec  GAIT: Distance walked: into clinic x 150' Assistive device utilized: Environmental Consultant - 2 wheeled Level of assistance: SBA Gait pattern: decreased step length- Left and decreased stance time- Left Comments: antalgic, decreased knee flexion in swing, decreased TKE at heel strike   TODAY'S TREATMENT:  06/10/24 THERAPEUTIC EXERCISE: To improve ROM.  Demonstration,  verbal and tactile cues throughout for technique. Bike L0 x 8' some rocking and some full revolutions  THERAPEUTIC ACTIVITIES: To improve functional performance.  Demonstration, verbal and tactile cues throughout for technique. 6 Step up and over and backward up and over x 3/5 LLE Step hang calf stretch x  1' x 2 BLE  NEUROMUSCULAR RE-EDUCATION: To improve strength, proprioception, and balance. Clock/star touch 12:00-6:00 standing on LLE x 5 circuits Long foam sidestepping x 5 laps Long foam tandem gait F/B x 5 laps Long foam toe raises x 20 BLE Resisted gait GTB doubled  at mat table F/B X 3 laps;  B/F X 3 laps;  S/S x 3 laps each direction   06/06/24 Nustep L4x32min UE/LE Bike x 20 full revolutions backwards   [facility] PT Assessment - 06/06/24 0001       Standardized Balance Assessment   Standardized Balance Assessment Dynamic Gait Index      Dynamic Gait Index   Level Surface Mild Impairment    Change in Gait Speed Mild Impairment    Gait with Horizontal Head Turns Normal    Gait with Vertical Head Turns Normal    Gait and Pivot Turn Normal    Step Over Obstacle Mild Impairment    Step Around Obstacles Normal    Steps Mild Impairment    Total Score 20  Step ups BLE 6' 2x10 Lateral step ups BLE 6' 2x10 Step ups BLE on foam with SLS for 3 sec x 10 Step up and over foam roll fwd and backward x 10 Measured AROM L knee    06/03/24 Bike Partial rev x 8 min Leg curls 15lb x 20 BLE; 5lb x 20; 10lb x 20 Leg extension 5lb 2x10 BLE  KT tape chondromalacia to L Knee- removed mid session, d/t pt noting restriction   TUG test- 13.48 with SPC 5xSTS- 16.88 hands braced on knees Functional squats at counter 2 x 10  MODALITIES: Vasopneumatic ice x 10' to L knee at minimal compression x 34 degrees  05/31/24 THERAPEUTIC EXERCISE: To improve ROM.  Demonstration, verbal and tactile cues throughout for technique. Bike L0 partial ROM rocking back and forth x 7'  THERAPEUTIC  ACTIVITIES: To improve functional performance.  Demonstration, verbal and tactile cues throughout for technique. 4 step ups F x 10;  S x 10 LLE Aerobic step calf stretch x 1' Seated knee extension 5# x 2/10 LLE Seated knee flexion 10# x 2/10 LLE Seated L knee flexion stretch with contralateral overpressure from the RLE  NEUROMUSCULAR RE-EDUCATION: To improve strength, proprioception, and balance. BOSU ball step stance lunges x 2/10 LLE Spanish squats black TB x 10 BLE  MANUAL THERAPY: To promote reduced pain and reduced edema utilizing kinesiotaping. KT tape to L knee chondromalacia pattern w/ 2 encircling I strips  05/28/24 THERAPEUTIC EXERCISE: To improve ROM.  Demonstration, verbal and tactile cues throughout for technique. Bike L0 partial ROM rocking back and forth x 7'  THERAPEUTIC ACTIVITIES: To improve functional performance.  Demonstration, verbal and tactile cues throughout for technique. LAQ 3# x 3/10 LLE Seated knee flexion RTB x 2/10 LLE Sit n slide flexion stretch x 1' x 3 LLE Supine SKTC flexion stretch x 1' x 3 LLE Standing gastroc stretch on 1/2 foam roll x 1' x 2 LLE Standing soleus stretch on 1/2 foam roll x 1' x 2 LLE  NEUROMUSCULAR RE-EDUCATION: To improve proprioception and balance. On airex:  EC x 1'  Heel to toe rocking x 20 BLE  Minisquat x 2/10 BLE  Marching x 2/10 BLE  Step ups x 2/10 LLE MANUAL THERAPY: To promote improved flexibility utilizing myofascial release and scar mobilization. MFR to L IT band distally, cross friction massage to L knee incision  MODALITIES: Vasopneumatic ice x 13' to L knee at minimal compression x 34 degrees  05/22/24 Nustep L3x52min UE/LE Gait around clinic with SPC 180 ft Seated L knee flexion 2x10 with slider Supine leg lengtheners towel under heel 2x10  Standing heel/toe raises x 20 Standing hip abduction x 10 BLE Standing marching BLE x 10  Standing hip extension BLE x 10 Standing L knee flexion stretch on 8  inch step 2x10  Vaso to L knee 34 deg compression med x 6 min 05/20/24 Nustep L2x36min UE/LE Standing L knee flexion 4 inch 2x10 Standing heel raises from 4 inch step x 12 Supine QS with therapist hand under knee 2x8 Supine heel slides with strap 2x10 Longsitting gastroc stretch with strap 2x30 sec Assisted SLR in supine x 10  05/15/24 Therapeutic Exercise: To improve strength, ROM, and flexibility.  Nustep L2x62min UE/LE Seated heel slides L knee 2x10 Supine heel slides feet on peanut ball 2x10 with strap Supine AP legs elevated on peanut ball x 20  Gait Training: To normalize gait pattern and improve safety.  With RW- 3 laps around clinic 270 ft-  cues for heel strike, knee flexion on swing  Neuromuscular Re-Ed: To improve coordination and proprioception.  Standing LLE while holding onto walker: Marching 2 x 10 Hip abduction 2 x 10 Hip extension 2 x 10 HS curl 2 x 10 Seated LLE SAQ knee on small ball 2x10  MODALITIES: Vasopneumatic ice x 10' to L knee at minimal compression x 34 degrees 05/13/24 SELF CARE: Provided education on PT POC progression and on post-surgical precautions.initial HEP  MODALITIES: Vasopneumatic ice x 13' to L knee at minimal compression x 34 degrees  PATIENT EDUCATION:  Education details: working on step ups at home as well as SKTC flexion stretch for ROM improvement  Person educated: Patient Education method: Programmer, Multimedia, Facilities Manager, Verbal cues, Tactile cues, Handouts, and MedBridgeGO app access provided Education comprehension: verbalized understanding, verbal cues required, tactile cues required, and needs further education  HOME EXERCISE PROGRAM: Access Code: [email or web address] URL: [email or web address] Date: 05/28/2024 Prepared by: Garnette Montclair  Exercises - Supine Ankle Pumps  - 1 x daily - 7 x weekly - 3 sets - 10 reps - Supine Quad Set  - 1 x daily - 7 x weekly - 3 sets - 10 reps - Supine Heel Slides  - 1 x daily - 7 x  weekly - 3 sets - 10 reps - Supine Short Arc Quad  - 1 x daily - 7 x weekly - 3 sets - 10 reps - Supine Straight Leg Raises  - 1 x daily - 7 x weekly - 1-2 sets - 10 reps - Standing Ankle Dorsiflexion with Table Support  - 1 x daily - 7 x weekly - 3 sets - 10 reps - Standing Heel Raises  - 1 x daily - 7 x weekly - 3 sets - 10 reps - Seated Knee Extension Stretch with Chair  - 1 x daily - 7 x weekly - 1 sets - 3 reps - 3-5 min hold - Seated Knee Flexion Stretch  - 1 x daily - 7 x weekly - 1 sets - 2-3 reps - 1 min hold - Prone Knee Flexion  - 1 x daily - 7 x weekly - 3 sets - 10 reps - Prone Hip Extension  - 1 x daily - 7 x weekly - 3 sets - 10 reps - Standing Gastroc Stretch on Foam 1/2 Roll  - 1 x daily - 7 x weekly - 1 sets - 2 reps - 1 min hold - Standing Soleus Stretch on Foam 1/2 Roll  - 1 x daily - 7 x weekly - 1 sets - 2 reps -  hold  ASSESSMENT:  CLINICAL IMPRESSION:  Patient has been seen x 9 PT visits over the last month for a L TKA.   She is doing much better with her L TKA than she did with her R TKA a few months ago.   L knee ROM is 0-100 to 105 degrees variably.   Flexion still needs to improve for her to be able to function normally, but she is progressing.   Her DGI score is 20/24 indicating decreased fall risk.   However, she continues to depend on a cane for community ambulation.   5X STS has improved from 26.49 sec to 13.48 sec.   TUG score has improved from 26.49 sec to 16.88 sec.  She has not yet attained the goals that we have set for her, but has excellent rehab potential to meet her goals.   PT remains necessary for ROM, strength, balance, gait,  pain deficits.  Continue per POC  EVAL:  Dwayne Begay is a 59 y.o. female who was referred to physical therapy for evaluation and treatment for L TKA.  She is well known to us  from recent R TKA.  Surgery was 12/2, so she is 2 weeks post op.   Surprisingly she is not as stiff as we would have expected for 2 weeks without PT.    ROM is -5 to 85 degrees.    Pain is worse with end range flexion or extension of the L knee and with prolonged ambulation.  Patient has deficits in L knee ROM, LLE strength, walking,balance, edema, and pain which are interfering with ADLs and are impacting quality of life.  On LEFS patient scored 30/80 demonstrating moderate functional limitation.  Indiyah will benefit from skilled PT to address above deficits to improve mobility and activity tolerance with decreased pain interference.   ---OF NOTE--the patient began to c/o calf pain at the end of vasopneumatic treatment today.   She relates the pain is posterolateral at the popliteal fossa and just distal to the fibular head on the upper lateral calf.   She states she has been having this pain since surgery.  However, she reports her eliquis  was D/C early due to bruising so she has not been taking it.   Contacted Dr Benjiman office and informed them of the above information.  Patient will f/u with their office tomorrow for bandage change/removal and is advised to discuss this pain with them.  She is advised to call 911 if she experiences any chest pain, SOB, diaphoresis, etc.    OBJECTIVE IMPAIRMENTS: difficulty walking, decreased ROM, decreased strength, increased edema, and pain.   ACTIVITY LIMITATIONS: carrying, lifting, bending, standing, squatting, stairs, and locomotion level  PARTICIPATION LIMITATIONS: meal prep, cleaning, laundry, driving, shopping, and community activity  PERSONAL FACTORS: Age and 1-2 comorbidities: DM, HTN, bronchitis, R TKA are also affecting patient's functional outcome.   REHAB POTENTIAL: Good  CLINICAL DECISION MAKING: Evolving/moderate complexity  EVALUATION COMPLEXITY: Moderate   GOALS: Goals reviewed with patient? Yes  SHORT TERM GOALS: Target date: 06/10/2024   Patient will be independent with initial HEP. Baseline: 100% PT assist required for correct completion 05/28/24:  patient can teach back 100% Goal  status: MET  2.  Patient will report at least 25% improvement in L knee pain to improve QOL. Baseline: 8/10 worst 05/28/24:  7/10 Goal status: IN PROGRESS  3.  Patient will ambulate with a SPC or no device indoors on level ground independently with good balance Baseline: walker dependent with SBA  05/28/24:  SPC with mild limp Goal status: IN PROGRESS   LONG TERM GOALS: Target date: 07/08/2024   Patient will be independent with advanced/ongoing HEP to improve outcomes and carryover.  Baseline: no advanced HEP yet 05/28/24: advanced today  Goal status: IN PROGRESS  2.  Patient will report at least 50-75% improvement in L knee pain to improve QOL. Baseline: 8/10 worst 06/10/24:  2/10 worst Goal status: MET  3.  Patient will demonstrate improved L knee AROM to >/= 0-130 deg to allow for normal gait and stair mechanics. Baseline: Refer to above LE ROM table 05/28/24:  see ROM table above; -4 to 95 deg 06/10/24:  -4 to 100 deg Goal status: IN PROGRESS  4.  Patient will demonstrate improved LLE strength to >/= 5/5 for improved stability and ease of mobility. Baseline: Refer to above LE MMT table Goal status: INITIAL  5.  Patient  will be able to ambulate 30' with no device and normal gait pattern without increased pain to access community.  Baseline: 5 min 06/10/24:  patient reports can ambulate 20 minutes at [facility] using her cane Goal status: INITIAL  6. Patient will be able to ascend/descend stairs with 1 HR and reciprocal step pattern safely to access home and community.  Baseline: 1 step at a time holding rail leading with RLE only going up/LLE only going down Goal status: INITIAL  7.  Patient will report >/= 50/80 on LEFS (MCID = 9 pts) to demonstrate improved functional ability. Baseline: 30/80 Goal status: INITIAL  8.  Patient will demonstrate at least 19/24 on DGI to decrease risk of falls. Baseline: TBD 06/06/24:  20/24 Goal status: MET-  9.  Patient will improve on  TUG test to </= 14 sec to reduce fall risk Baseline: 25.61 sec 06/03/24:  13.48 sec Goal status: IN PROGRESS  10.  Patient will improve on 5X STS to </= to 12 sec to demonstrate improved glut/quad strength for independence with transfers from low seating surfaces   Baseline:  26.49 sec 06/06/24:  16.88 sec   Goal status:  progressing-   PLAN:  PT FREQUENCY: 1-2x/week  PT DURATION: 8 weeks  PLANNED INTERVENTIONS: 97750- Physical Performance Testing, 97110-Therapeutic exercises, 97530- Therapeutic activity, 97112- Neuromuscular re-education, 97535- Self Care, 02859- Manual therapy, [id number]- Electrical stimulation (unattended), 97016- Vasopneumatic device, 20560 (1-2 muscles), 20561 (3+ muscles)- Dry Needling, Patient/Family education, Taping, Joint mobilization, Cryotherapy, and Moist heat  PLAN FOR NEXT SESSION:  continue to work on L knee flexion ROM and progressive dynamic balance activities  Telsa Dillavou, PT 06/10/2024, 4:34 PM  "

## 2024-06-10 ENCOUNTER — Encounter: Payer: Self-pay | Admitting: Rehabilitation

## 2024-06-10 ENCOUNTER — Ambulatory Visit: Admitting: Rehabilitation

## 2024-06-10 DIAGNOSIS — M25661 Stiffness of right knee, not elsewhere classified: Secondary | ICD-10-CM

## 2024-06-10 DIAGNOSIS — M25561 Pain in right knee: Secondary | ICD-10-CM | POA: Diagnosis not present

## 2024-06-10 DIAGNOSIS — R600 Localized edema: Secondary | ICD-10-CM

## 2024-06-10 DIAGNOSIS — R262 Difficulty in walking, not elsewhere classified: Secondary | ICD-10-CM

## 2024-06-10 DIAGNOSIS — M6281 Muscle weakness (generalized): Secondary | ICD-10-CM

## 2024-06-11 ENCOUNTER — Ambulatory Visit: Admitting: Physician Assistant

## 2024-06-11 ENCOUNTER — Other Ambulatory Visit (INDEPENDENT_AMBULATORY_CARE_PROVIDER_SITE_OTHER): Payer: Self-pay

## 2024-06-11 DIAGNOSIS — Z96652 Presence of left artificial knee joint: Secondary | ICD-10-CM | POA: Diagnosis not present

## 2024-06-11 NOTE — Progress Notes (Signed)
 "  Post-Op Visit Note   Patient: Ann Foster           Date of Birth: September 11, 1965           MRN: [id number] Visit Date: 06/11/2024 PCP: Theotis Haze ORN, NP   Assessment & Plan:  Chief Complaint:  Chief Complaint  Patient presents with   Left Knee - Follow-up    Left TKA    Visit Diagnoses:  1. Status post total left knee replacement     Plan: Patient is a pleasant 59 year old female who comes in today 6 weeks status post left total knee replacement.  She feels as though she is coming along.  She is in PT twice a week and ambulating with a cane.  She is not taking anything for pain.  She has finished her aspirin for which she was taking for DVT prophylaxis.  Examination of the left knee reveals a well-healed surgical scar without complication.  Range of motion 0 to 115 degrees.  She is neurovascularly intact distally.  At this point, she will continue with physical therapy.  Follow-up in 6 weeks for recheck.  Call with concerns or questions.  Follow-Up Instructions: Return in about 6 weeks (around 07/23/2024).   Orders:  Orders Placed This Encounter  Procedures   XR Knee 1-2 Views Left   No orders of the defined types were placed in this encounter.   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, with long-term current use of insulin  (HCC) 04/25/2022   Amblyopia of eye, right 07/27/2021   Corneal guttata of left eye 07/27/2021   Influenza vaccine refused 07/29/2020   COVID-19 vaccine dose declined 07/29/2020   Vasomotor symptoms due to  menopause 04/24/2020   Atrophic vaginitis 04/24/2020   Impingement syndrome of left shoulder 01/23/2018   Past Medical History:  Diagnosis Date   Ankle fracture 2016   Right   Aortic atherosclerosis    trace calcific atherosclerosis aortic arch per ct neck done 12-22-17   Bronchitis    Diabetes mellitus without complication (HCC)    GERD (gastroesophageal reflux disease)    Hypertension    OA (osteoarthritis) of shoulder    left shoulder, both knees arthritis   Osteoarthritis, knee    Sleep apnea    wears CPAP    Family History  Problem Relation Age of Onset   Breast cancer Mother    Colon cancer Father 4   Diabetes Son    Esophageal cancer Neg Hx    Stomach cancer Neg Hx    Liver disease Neg Hx    Pancreatic cancer Neg Hx    Rectal cancer Neg Hx     Past Surgical History:  Procedure Laterality Date   CHOLECYSTECTOMY N/A 04/15/2023   Procedure: LAPAROSCOPIC CHOLECYSTECTOMY WITH ICG DYE;  Surgeon: Ebbie Cough, MD;  Location: [facility];  Service: General;  Laterality: N/A;   COLONOSCOPY     TOTAL KNEE ARTHROPLASTY Right 11/20/2023   Procedure: ARTHROPLASTY, KNEE, TOTAL RIGHT;  Surgeon: Jerri Kay HERO, MD;  Location: [facility];  Service: Orthopedics;  Laterality: Right;  TOTAL KNEE ARTHROPLASTY Left 04/29/2024   Procedure: ARTHROPLASTY, KNEE, TOTAL;  Surgeon: Jerri Kay HERO, MD;  Location: [facility];  Service: Orthopedics;  Laterality: Left;   Social History   Occupational History   Not on file  Tobacco Use   Smoking status: Every Day    Current packs/day: 0.00    Average packs/day: 0.3 packs/day    Types: Cigarettes    Last attempt to quit: 11/28/2019    Years since quitting: 4.5   Smokeless tobacco: Never  Vaping Use   Vaping status: Never Used  Substance and Sexual Activity   Alcohol use: Not Currently   Drug use: No   Sexual activity: Not Currently    Birth control/protection: Condom     "

## 2024-06-13 ENCOUNTER — Ambulatory Visit

## 2024-06-13 DIAGNOSIS — M25661 Stiffness of right knee, not elsewhere classified: Secondary | ICD-10-CM

## 2024-06-13 DIAGNOSIS — M25561 Pain in right knee: Secondary | ICD-10-CM | POA: Diagnosis not present

## 2024-06-13 DIAGNOSIS — M6281 Muscle weakness (generalized): Secondary | ICD-10-CM

## 2024-06-13 DIAGNOSIS — R262 Difficulty in walking, not elsewhere classified: Secondary | ICD-10-CM

## 2024-06-13 DIAGNOSIS — R600 Localized edema: Secondary | ICD-10-CM

## 2024-06-13 NOTE — Therapy (Addendum)
 " OUTPATIENT PHYSICAL THERAPY LOWER EXTREMITY TREATMENT     Patient Name: Ann Foster MRN: [id number] DOB:August 18, 1965, 59 y.o., female Today's Date: 06/13/2024   END OF SESSION:  PT End of Session - 06/13/24 0926     Visit Number 10    Date for Recertification  07/08/24    PT Start Time 0848    PT Stop Time 0927    PT Time Calculation (min) 39 min    Activity Tolerance Patient tolerated treatment well;No increased pain    Behavior During Therapy [facility] for tasks assessed/performed                  Past Medical History:  Diagnosis Date   Ankle fracture 2016   Right   Aortic atherosclerosis    trace calcific atherosclerosis aortic arch per ct neck done 12-22-17   Bronchitis    Diabetes mellitus without complication (HCC)    GERD (gastroesophageal reflux disease)    Hypertension    OA (osteoarthritis) of shoulder    left shoulder, both knees arthritis   Osteoarthritis, knee    Sleep apnea    wears CPAP   Past Surgical History:  Procedure Laterality Date   CHOLECYSTECTOMY N/A 04/15/2023   Procedure: LAPAROSCOPIC CHOLECYSTECTOMY WITH ICG DYE;  Surgeon: Ebbie Cough, MD;  Location: [facility];  Service: General;  Laterality: N/A;   COLONOSCOPY     TOTAL KNEE ARTHROPLASTY Right 11/20/2023   Procedure: ARTHROPLASTY, KNEE, TOTAL RIGHT;  Surgeon: Jerri Kay HERO, MD;  Location: [facility] OR;  Service: Orthopedics;  Laterality: Right;   TOTAL KNEE ARTHROPLASTY Left 04/29/2024   Procedure: ARTHROPLASTY, KNEE, TOTAL;  Surgeon: Jerri Kay HERO, MD;  Location: [facility];  Service: Orthopedics;  Laterality: Left;   Patient Active Problem List   Diagnosis Date Noted   Status post total left knee replacement 04/29/2024   Primary osteoarthritis of left knee 02/22/2024   Status post total right knee replacement 11/20/2023   Snoring 06/07/2023   Acute cholecystitis 04/13/2023   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, with  long-term current use of insulin  (HCC) 04/25/2022   Amblyopia of eye, right 07/27/2021   Corneal guttata of left eye 07/27/2021   Influenza vaccine refused 07/29/2020   COVID-19 vaccine dose declined 07/29/2020   Vasomotor symptoms due to menopause 04/24/2020   Atrophic vaginitis 04/24/2020   Impingement syndrome of left shoulder 01/23/2018    PCP: Theotis Haze ORN, NP   REFERRING PROVIDER: Jule Ronal CROME, PA-C   REFERRING DIAG: [id number] (ICD-10-CM) - Hx of total knee replacement, left  THERAPY DIAG:  Acute pain of right knee  Stiffness of right knee, not elsewhere classified  Difficulty in walking, not elsewhere classified  Muscle weakness (generalized)  Localized edema  RATIONALE FOR EVALUATION AND TREATMENT: Rehabilitation  ONSET DATE: 04/30/24  NEXT MD VISIT: 05/13/24   SUBJECTIVE:  SUBJECTIVE STATEMENT:  Doing good, MD took x-rays and was pleased  EVAL:  59 y/o patient referred to PT for L TKA by Dr Jerri on 04/30/24.  She is well known to us  from recent R TKA.   She is 2 weeks post op tomorrow.   Unclear why it took so long for her to get into see us .   She did not have any HH PT.   She reports she has been doing some of her home exercises from her R TKA on her own at home.  She has an Aquacel bandage in place over the L knee incision that is going to be removed/changed tomorrow when she f/u with Dr Jerri.  The bandage is loose, but it has not loosened to the inner square yet so the incision is not exposed.   Patient is advised that if it does come loose exposing the incision that she is call MD office and report.   She is also recommended not to shower or get the bandage wet until after MD appointment tomorrow, since it is just barely intact.     PAIN: Are you having pain? Yes:  NPRS scale: 7/10 Pain location: L knee Pain description: aching/sore always,  sharp at times Aggravating factors: prolonged walking, end range stretching into flexion/extension Relieving factors: rest, ice, muscle relaxers  PERTINENT HISTORY:  DM, HTN, bronchitis, R TKA  PRECAUTIONS: None  RED FLAGS: None  WEIGHT BEARING RESTRICTIONS: No  FALLS:  Has patient fallen in last 6 months? No  LIVING ENVIRONMENT: Lives with: lives with their family Lives in: House/apartment Stairs: Yes: External: 1 steps; none Has following equipment at home: Single point cane, Walker - 2 wheeled, and bed side commode  OCCUPATION: retired  PLOF: Independent with gait  PATIENT GOALS: get back 100%    OBJECTIVE: (objective measures completed at initial evaluation unless otherwise dated)  DIAGNOSTIC FINDINGS:  N/A  PATIENT SURVEYS:  LEFS  Extreme difficulty/unable (0), Quite a bit of difficulty (1), Moderate difficulty (2), Little difficulty (3), No difficulty (4) Survey date:  05/13/24  Any of your usual work, housework or school activities 1  2. Usual hobbies, recreational or sporting activities 1  3. Getting into/out of the bath 3  4. Walking between rooms 3  5. Putting on socks/shoes 3  6. Squatting  1  7. Lifting an object, like a bag of groceries from the floor 1  8. Performing light activities around your home 2  9. Performing heavy activities around your home 1  10. Getting into/out of a car 2  11. Walking 2 blocks 1  12. Walking 1 mile 0  13. Going up/down 10 stairs (1 flight) 2  14. Standing for 1 hour 3  15.  sitting for 1 hour 3  16. Running on even ground 0  17. Running on uneven ground 0  18. Making sharp turns while running fast 0  19. Hopping  0  20. Rolling over in bed 3  Score total:  30/80     COGNITION: Overall cognitive status: Within functional limits for tasks assessed    SENSATION: WFL  EDEMA:  Circumferential: L knee = 48.5 cm;  R knee = 44.0  cm  POSTURE:  No Significant postural limitations  PALPATION: Tender to palpate around the L knee in general   LOWER EXTREMITY ROM:  Active ROM Right eval Left eval  Hip flexion    Hip extension    Hip abduction    Hip adduction  Hip internal rotation    Hip external rotation    Knee flexion    Knee extension    Ankle dorsiflexion    Ankle plantarflexion    Ankle inversion    Ankle eversion     Passive ROM Right eval Left eval 05/15/24 05/22/24 AROM 05/28/24 LLE AROM 06/06/24 L knee L knee 06/10/24  Hip flexion         Hip extension         Hip abduction         Hip adduction         Hip internal rotation         Hip external rotation         Knee flexion 90 85 98 103 95 104 sitting  100 sit and scoot  Knee extension 0 -6 6  -5 4 LAQ   Ankle dorsiflexion         Ankle plantarflexion         Ankle inversion         Ankle eversion         (Blank rows = not tested)  LOWER EXTREMITY MMT:  MMT Right eval Left eval  Hip flexion 5 4-  Hip extension    Hip abduction 4+ 3+  Hip adduction    Hip internal rotation 4+ 4-  Hip external rotation 5 4  Knee flexion 5 4  Knee extension 5 3+  Ankle dorsiflexion 5 4+  Ankle plantarflexion    Ankle inversion    Ankle eversion     (Blank rows = not tested)  LOWER EXTREMITY SPECIAL TESTS:  N/a  FUNCTIONAL TESTS:  5X STS = 26.49 sec TUG = 25.61 sec  GAIT: Distance walked: into clinic x 150' Assistive device utilized: Environmental Consultant - 2 wheeled Level of assistance: SBA Gait pattern: decreased step length- Left and decreased stance time- Left Comments: antalgic, decreased knee flexion in swing, decreased TKE at heel strike   TODAY'S TREATMENT:  06/13/24 Nustep L5x5min UE/LE HS curls BLE 20lb x 20; R/L 10lb 2x10 Knee extension 10lb x 20 BLE; 10lb x 10 R/L LEFS: 63 / 80 = 78.8 % Assessed stairs: ascending reciprocal pattern with HR; descending step to leading with LLE Gait 600' w/o AD SBA- mild antaglic gait  Leg press  BLE 79oa 2x10 BLE; 10lb R/L 2x10 Clock reaching R/L 12 to 6 o'clock x 5  Sit to stands from low mat table x 10     06/10/24 THERAPEUTIC EXERCISE: To improve ROM.  Demonstration, verbal and tactile cues throughout for technique. Bike L0 x 8' some rocking and some full revolutions  THERAPEUTIC ACTIVITIES: To improve functional performance.  Demonstration, verbal and tactile cues throughout for technique. 6 Step up and over and backward up and over x 3/5 LLE Step hang calf stretch x  1' x 2 BLE  NEUROMUSCULAR RE-EDUCATION: To improve strength, proprioception, and balance. Clock/star touch 12:00-6:00 standing on LLE x 5 circuits Long foam sidestepping x 5 laps Long foam tandem gait F/B x 5 laps Long foam toe raises x 20 BLE Resisted gait GTB doubled  at mat table F/B X 3 laps;  B/F X 3 laps;  S/S x 3 laps each direction   06/06/24 Nustep L4x65min UE/LE Bike x 20 full revolutions backwards   [facility] PT Assessment - 06/06/24 0001       Standardized Balance Assessment   Standardized Balance Assessment Dynamic Gait Index      Dynamic Gait Index  Level Surface Mild Impairment    Change in Gait Speed Mild Impairment    Gait with Horizontal Head Turns Normal    Gait with Vertical Head Turns Normal    Gait and Pivot Turn Normal    Step Over Obstacle Mild Impairment    Step Around Obstacles Normal    Steps Mild Impairment    Total Score 20         Step ups BLE 6' 2x10 Lateral step ups BLE 6' 2x10 Step ups BLE on foam with SLS for 3 sec x 10 Step up and over foam roll fwd and backward x 10 Measured AROM L knee    06/03/24 Bike Partial rev x 8 min Leg curls 15lb x 20 BLE; 5lb x 20; 10lb x 20 Leg extension 5lb 2x10 BLE  KT tape chondromalacia to L Knee- removed mid session, d/t pt noting restriction   TUG test- 13.48 with SPC 5xSTS- 16.88 hands braced on knees Functional squats at counter 2 x 10  MODALITIES: Vasopneumatic ice x 10' to L knee at minimal compression x 34  degrees  05/31/24 THERAPEUTIC EXERCISE: To improve ROM.  Demonstration, verbal and tactile cues throughout for technique. Bike L0 partial ROM rocking back and forth x 7'  THERAPEUTIC ACTIVITIES: To improve functional performance.  Demonstration, verbal and tactile cues throughout for technique. 4 step ups F x 10;  S x 10 LLE Aerobic step calf stretch x 1' Seated knee extension 5# x 2/10 LLE Seated knee flexion 10# x 2/10 LLE Seated L knee flexion stretch with contralateral overpressure from the RLE  NEUROMUSCULAR RE-EDUCATION: To improve strength, proprioception, and balance. BOSU ball step stance lunges x 2/10 LLE Spanish squats black TB x 10 BLE  MANUAL THERAPY: To promote reduced pain and reduced edema utilizing kinesiotaping. KT tape to L knee chondromalacia pattern w/ 2 encircling I strips  05/28/24 THERAPEUTIC EXERCISE: To improve ROM.  Demonstration, verbal and tactile cues throughout for technique. Bike L0 partial ROM rocking back and forth x 7'  THERAPEUTIC ACTIVITIES: To improve functional performance.  Demonstration, verbal and tactile cues throughout for technique. LAQ 3# x 3/10 LLE Seated knee flexion RTB x 2/10 LLE Sit n slide flexion stretch x 1' x 3 LLE Supine SKTC flexion stretch x 1' x 3 LLE Standing gastroc stretch on 1/2 foam roll x 1' x 2 LLE Standing soleus stretch on 1/2 foam roll x 1' x 2 LLE  NEUROMUSCULAR RE-EDUCATION: To improve proprioception and balance. On airex:  EC x 1'  Heel to toe rocking x 20 BLE  Minisquat x 2/10 BLE  Marching x 2/10 BLE  Step ups x 2/10 LLE MANUAL THERAPY: To promote improved flexibility utilizing myofascial release and scar mobilization. MFR to L IT band distally, cross friction massage to L knee incision  MODALITIES: Vasopneumatic ice x 13' to L knee at minimal compression x 34 degrees  05/22/24 Nustep L3x52min UE/LE Gait around clinic with SPC 180 ft Seated L knee flexion 2x10 with slider Supine leg lengtheners  towel under heel 2x10  Standing heel/toe raises x 20 Standing hip abduction x 10 BLE Standing marching BLE x 10  Standing hip extension BLE x 10 Standing L knee flexion stretch on 8 inch step 2x10  Vaso to L knee 34 deg compression med x 6 min 05/20/24 Nustep L2x31min UE/LE Standing L knee flexion 4 inch 2x10 Standing heel raises from 4 inch step x 12 Supine QS with therapist hand under knee 2x8 Supine heel slides  with strap 2x10 Longsitting gastroc stretch with strap 2x30 sec Assisted SLR in supine x 10  05/15/24 Therapeutic Exercise: To improve strength, ROM, and flexibility.  Nustep L2x35min UE/LE Seated heel slides L knee 2x10 Supine heel slides feet on peanut ball 2x10 with strap Supine AP legs elevated on peanut ball x 20  Gait Training: To normalize gait pattern and improve safety.  With RW- 3 laps around clinic 270 ft- cues for heel strike, knee flexion on swing  Neuromuscular Re-Ed: To improve coordination and proprioception.  Standing LLE while holding onto walker: Marching 2 x 10 Hip abduction 2 x 10 Hip extension 2 x 10 HS curl 2 x 10 Seated LLE SAQ knee on small ball 2x10  MODALITIES: Vasopneumatic ice x 10' to L knee at minimal compression x 34 degrees 05/13/24 SELF CARE: Provided education on PT POC progression and on post-surgical precautions.initial HEP  MODALITIES: Vasopneumatic ice x 13' to L knee at minimal compression x 34 degrees  PATIENT EDUCATION:  Education details: working on step ups at home as well as SKTC flexion stretch for ROM improvement  Person educated: Patient Education method: Programmer, Multimedia, Facilities Manager, Verbal cues, Tactile cues, Handouts, and MedBridgeGO app access provided Education comprehension: verbalized understanding, verbal cues required, tactile cues required, and needs further education  HOME EXERCISE PROGRAM: Access Code: [email or web address] URL: [email or web address] Date: 05/28/2024 Prepared by: Garnette Montclair  Exercises - Supine Ankle Pumps  - 1 x daily - 7 x weekly - 3 sets - 10 reps - Supine Quad Set  - 1 x daily - 7 x weekly - 3 sets - 10 reps - Supine Heel Slides  - 1 x daily - 7 x weekly - 3 sets - 10 reps - Supine Short Arc Quad  - 1 x daily - 7 x weekly - 3 sets - 10 reps - Supine Straight Leg Raises  - 1 x daily - 7 x weekly - 1-2 sets - 10 reps - Standing Ankle Dorsiflexion with Table Support  - 1 x daily - 7 x weekly - 3 sets - 10 reps - Standing Heel Raises  - 1 x daily - 7 x weekly - 3 sets - 10 reps - Seated Knee Extension Stretch with Chair  - 1 x daily - 7 x weekly - 1 sets - 3 reps - 3-5 min hold - Seated Knee Flexion Stretch  - 1 x daily - 7 x weekly - 1 sets - 2-3 reps - 1 min hold - Prone Knee Flexion  - 1 x daily - 7 x weekly - 3 sets - 10 reps - Prone Hip Extension  - 1 x daily - 7 x weekly - 3 sets - 10 reps - Standing Gastroc Stretch on Foam 1/2 Roll  - 1 x daily - 7 x weekly - 1 sets - 2 reps - 1 min hold - Standing Soleus Stretch on Foam 1/2 Roll  - 1 x daily - 7 x weekly - 1 sets - 2 reps -  hold  ASSESSMENT:  CLINICAL IMPRESSION: Pt shows deficits with stair navigation when descending resorting to step to pattern rather than reciprocal pattern. She is able to ambulate for a longer distance however shows an antalgic gait. Progressed strengthening today to improve STS transfers and standing balance.  PT remains necessary for ROM, strength, balance, gait, pain deficits.  Continue per POC  EVAL:  Ann Foster is a 59 y.o. female who was referred to physical therapy for evaluation and  treatment for L TKA.  She is well known to us  from recent R TKA.  Surgery was 12/2, so she is 2 weeks post op.   Surprisingly she is not as stiff as we would have expected for 2 weeks without PT.   ROM is -5 to 85 degrees.    Pain is worse with end range flexion or extension of the L knee and with prolonged ambulation.  Patient has deficits in L knee ROM, LLE strength,  walking,balance, edema, and pain which are interfering with ADLs and are impacting quality of life.  On LEFS patient scored 30/80 demonstrating moderate functional limitation.  Jaice will benefit from skilled PT to address above deficits to improve mobility and activity tolerance with decreased pain interference.   ---OF NOTE--the patient began to c/o calf pain at the end of vasopneumatic treatment today.   She relates the pain is posterolateral at the popliteal fossa and just distal to the fibular head on the upper lateral calf.   She states she has been having this pain since surgery.  However, she reports her eliquis  was D/C early due to bruising so she has not been taking it.   Contacted Dr Benjiman office and informed them of the above information.  Patient will f/u with their office tomorrow for bandage change/removal and is advised to discuss this pain with them.  She is advised to call 911 if she experiences any chest pain, SOB, diaphoresis, etc.    OBJECTIVE IMPAIRMENTS: difficulty walking, decreased ROM, decreased strength, increased edema, and pain.   ACTIVITY LIMITATIONS: carrying, lifting, bending, standing, squatting, stairs, and locomotion level  PARTICIPATION LIMITATIONS: meal prep, cleaning, laundry, driving, shopping, and community activity  PERSONAL FACTORS: Age and 1-2 comorbidities: DM, HTN, bronchitis, R TKA are also affecting patient's functional outcome.   REHAB POTENTIAL: Good  CLINICAL DECISION MAKING: Evolving/moderate complexity  EVALUATION COMPLEXITY: Moderate   GOALS: Goals reviewed with patient? Yes  SHORT TERM GOALS: Target date: 06/10/2024   Patient will be independent with initial HEP. Baseline: 100% PT assist required for correct completion 05/28/24:  patient can teach back 100% Goal status: MET  2.  Patient will report at least 25% improvement in L knee pain to improve QOL. Baseline: 8/10 worst 05/28/24:  7/10 Goal status: IN PROGRESS  3.  Patient  will ambulate with a SPC or no device indoors on level ground independently with good balance Baseline: walker dependent with SBA  05/28/24:  SPC with mild limp Goal status: IN PROGRESS   LONG TERM GOALS: Target date: 07/08/2024   Patient will be independent with advanced/ongoing HEP to improve outcomes and carryover.  Baseline: no advanced HEP yet 05/28/24: advanced today  Goal status: IN PROGRESS  2.  Patient will report at least 50-75% improvement in L knee pain to improve QOL. Baseline: 8/10 worst 06/10/24:  2/10 worst Goal status: MET  3.  Patient will demonstrate improved L knee AROM to >/= 0-130 deg to allow for normal gait and stair mechanics. Baseline: Refer to above LE ROM table 05/28/24:  see ROM table above; -4 to 95 deg 06/10/24:  -4 to 100 deg Goal status: IN PROGRESS  4.  Patient will demonstrate improved LLE strength to >/= 5/5 for improved stability and ease of mobility. Baseline: Refer to above LE MMT table Goal status: INITIAL  5.  Patient will be able to ambulate 30' with no device and normal gait pattern without increased pain to access community.  Baseline: 5 min 06/10/24:  patient  reports can ambulate 20 minutes at [facility] using her cane Goal status: IN PROGRESS- 06/13/24  6. Patient will be able to ascend/descend stairs with 1 HR and reciprocal step pattern safely to access home and community.  Baseline: 1 step at a time holding rail leading with RLE only going up/LLE only going down Goal status: IN PROGRESS- 06/13/24   7.  Patient will report >/= 50/80 on LEFS (MCID = 9 pts) to demonstrate improved functional ability. Baseline: 30/80 Goal status: MET- 06/13/24 -- 63 / 80 = 78.8 %  8.  Patient will demonstrate at least 19/24 on DGI to decrease risk of falls. Baseline: TBD 06/06/24:  20/24 Goal status: MET-  9.  Patient will improve on TUG test to </= 14 sec to reduce fall risk Baseline: 25.61 sec 06/03/24:  13.48 sec Goal status: IN PROGRESS  10.   Patient will improve on 5X STS to </= to 12 sec to demonstrate improved glut/quad strength for independence with transfers from low seating surfaces   Baseline:  26.49 sec 06/06/24:  16.88 sec   Goal status:  progressing-   PLAN:  PT FREQUENCY: 1-2x/week  PT DURATION: 8 weeks  PLANNED INTERVENTIONS: 97750- Physical Performance Testing, 97110-Therapeutic exercises, 97530- Therapeutic activity, 97112- Neuromuscular re-education, 97535- Self Care, [id number]- Manual therapy, [id number]- Electrical stimulation (unattended), 97016- Vasopneumatic device, 20560 (1-2 muscles), 20561 (3+ muscles)- Dry Needling, Patient/Family education, Taping, Joint mobilization, Cryotherapy, and Moist heat  PLAN FOR NEXT SESSION:  continue to work on L knee flexion ROM and progressive dynamic balance activities  Sol LITTIE Gaskins, PTA 06/13/2024, 9:31 AM  "

## 2024-06-16 NOTE — Therapy (Signed)
 " OUTPATIENT PHYSICAL THERAPY LOWER EXTREMITY TREATMENT     Patient Name: Ann Foster MRN: [id number] DOB:[birth year], 59 y.o., female Today's Date: 06/17/2024   END OF SESSION:  PT End of Session - 06/17/24 0837     Visit Number 11    Date for Recertification  07/08/24    PT Start Time 0838    PT Stop Time 0929    PT Time Calculation (min) 51 min    Activity Tolerance Patient tolerated treatment well;No increased pain    Behavior During Therapy [facility] for tasks assessed/performed                   Past Medical History:  Diagnosis Date   Ankle fracture 2016   Right   Aortic atherosclerosis    trace calcific atherosclerosis aortic arch per ct neck done 12-22-17   Bronchitis    Diabetes mellitus without complication (HCC)    GERD (gastroesophageal reflux disease)    Hypertension    OA (osteoarthritis) of shoulder    left shoulder, both knees arthritis   Osteoarthritis, knee    Sleep apnea    wears CPAP   Past Surgical History:  Procedure Laterality Date   CHOLECYSTECTOMY N/A 04/15/2023   Procedure: LAPAROSCOPIC CHOLECYSTECTOMY WITH ICG DYE;  Surgeon: Ebbie Cough, MD;  Location: [facility];  Service: General;  Laterality: N/A;   COLONOSCOPY     TOTAL KNEE ARTHROPLASTY Right 11/20/2023   Procedure: ARTHROPLASTY, KNEE, TOTAL RIGHT;  Surgeon: Jerri Kay HERO, MD;  Location: [facility] OR;  Service: Orthopedics;  Laterality: Right;   TOTAL KNEE ARTHROPLASTY Left 04/29/2024   Procedure: ARTHROPLASTY, KNEE, TOTAL;  Surgeon: Jerri Kay HERO, MD;  Location: [facility];  Service: Orthopedics;  Laterality: Left;   Patient Active Problem List   Diagnosis Date Noted   Status post total left knee replacement 04/29/2024   Primary osteoarthritis of left knee 02/22/2024   Status post total right knee replacement 11/20/2023   Snoring 06/07/2023   Acute cholecystitis 04/13/2023   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, with  long-term current use of insulin  (HCC) 04/25/2022   Amblyopia of eye, right 07/27/2021   Corneal guttata of left eye 07/27/2021   Influenza vaccine refused 07/29/2020   COVID-19 vaccine dose declined 07/29/2020   Vasomotor symptoms due to menopause 04/24/2020   Atrophic vaginitis 04/24/2020   Impingement syndrome of left shoulder 01/23/2018    PCP: Theotis Haze ORN, NP   REFERRING PROVIDER: Jule Ronal CROME, PA-C   REFERRING DIAG: [phone] (ICD-10-CM) - Hx of total knee replacement, left  THERAPY DIAG:  Acute pain of right knee  Stiffness of right knee, not elsewhere classified  Difficulty in walking, not elsewhere classified  Muscle weakness (generalized)  Localized edema  RATIONALE FOR EVALUATION AND TREATMENT: Rehabilitation  ONSET DATE: 04/30/24  NEXT MD VISIT: 05/13/24   SUBJECTIVE:  SUBJECTIVE STATEMENT:  Patient reports feeling ok.  Denies any pain except for with end range R knee flexion.  She is ambulating with no device about 50% of the time per her report.   EVAL:  59 y/o patient referred to PT for L TKA by Dr Jerri on 04/30/24.  She is well known to us  from recent R TKA.   She is 2 weeks post op tomorrow.   Unclear why it took so long for her to get into see us .   She did not have any HH PT.   She reports she has been doing some of her home exercises from her R TKA on her own at home.  She has an Aquacel bandage in place over the L knee incision that is going to be removed/changed tomorrow when she f/u with Dr Jerri.  The bandage is loose, but it has not loosened to the inner square yet so the incision is not exposed.   Patient is advised that if it does come loose exposing the incision that she is call MD office and report.   She is also recommended not to shower or get the  bandage wet until after MD appointment tomorrow, since it is just barely intact.     PAIN: Are you having pain? Yes: NPRS scale: 7/10 Pain location: L knee Pain description: aching/sore always,  sharp at times Aggravating factors: prolonged walking, end range stretching into flexion/extension Relieving factors: rest, ice, muscle relaxers  PERTINENT HISTORY:  DM, HTN, bronchitis, R TKA  PRECAUTIONS: None  RED FLAGS: None  WEIGHT BEARING RESTRICTIONS: No  FALLS:  Has patient fallen in last 6 months? No  LIVING ENVIRONMENT: Lives with: lives with their family Lives in: House/apartment Stairs: Yes: External: 1 steps; none Has following equipment at home: Single point cane, Walker - 2 wheeled, and bed side commode  OCCUPATION: retired  PLOF: Independent with gait  PATIENT GOALS: get back 100%    OBJECTIVE: (objective measures completed at initial evaluation unless otherwise dated)  DIAGNOSTIC FINDINGS:  N/A  PATIENT SURVEYS:  LEFS  Extreme difficulty/unable (0), Quite a bit of difficulty (1), Moderate difficulty (2), Little difficulty (3), No difficulty (4) Survey date:  05/13/24  Any of your usual work, housework or school activities 1  2. Usual hobbies, recreational or sporting activities 1  3. Getting into/out of the bath 3  4. Walking between rooms 3  5. Putting on socks/shoes 3  6. Squatting  1  7. Lifting an object, like a bag of groceries from the floor 1  8. Performing light activities around your home 2  9. Performing heavy activities around your home 1  10. Getting into/out of a car 2  11. Walking 2 blocks 1  12. Walking 1 mile 0  13. Going up/down 10 stairs (1 flight) 2  14. Standing for 1 hour 3  15.  sitting for 1 hour 3  16. Running on even ground 0  17. Running on uneven ground 0  18. Making sharp turns while running fast 0  19. Hopping  0  20. Rolling over in bed 3  Score total:  30/80     COGNITION: Overall cognitive status: Within  functional limits for tasks assessed    SENSATION: WFL  EDEMA:  Circumferential: L knee = 48.5 cm;  R knee = 44.0 cm  POSTURE:  No Significant postural limitations  PALPATION: Tender to palpate around the L knee in general   LOWER EXTREMITY ROM:  Active  ROM Right eval Left eval  Hip flexion    Hip extension    Hip abduction    Hip adduction    Hip internal rotation    Hip external rotation    Knee flexion    Knee extension    Ankle dorsiflexion    Ankle plantarflexion    Ankle inversion    Ankle eversion     Passive ROM Right eval Left eval 05/15/24 05/22/24 AROM 05/28/24 LLE AROM 06/06/24 L knee L knee 06/10/24 L Knee 06/17/24  Hip flexion          Hip extension          Hip abduction          Hip adduction          Hip internal rotation          Hip external rotation          Knee flexion 90 85 98 103 95 104 sitting  100 sit and scoot   Knee extension 0 -6 6  -5 4 LAQ    Ankle dorsiflexion          Ankle plantarflexion          Ankle inversion          Ankle eversion          (Blank rows = not tested)  LOWER EXTREMITY MMT:  MMT Right eval Left eval  Hip flexion 5 4-  Hip extension    Hip abduction 4+ 3+  Hip adduction    Hip internal rotation 4+ 4-  Hip external rotation 5 4  Knee flexion 5 4  Knee extension 5 3+  Ankle dorsiflexion 5 4+  Ankle plantarflexion    Ankle inversion    Ankle eversion     (Blank rows = not tested)  LOWER EXTREMITY SPECIAL TESTS:  N/a  FUNCTIONAL TESTS:  5X STS = 26.49 sec TUG = 25.61 sec  GAIT: Distance walked: into clinic x 150' Assistive device utilized: Environmental Consultant - 2 wheeled Level of assistance: SBA Gait pattern: decreased step length- Left and decreased stance time- Left Comments: antalgic, decreased knee flexion in swing, decreased TKE at heel strike   TODAY'S TREATMENT:  06/17/24 THERAPEUTIC EXERCISE: To improve strength, endurance, and ROM.  Demonstration, verbal and tactile cues throughout for  technique. Bike x 8' some full ROM, some partial  THERAPEUTIC ACTIVITIES: To improve functional performance.  Demonstration, verbal and tactile cues throughout for technique. 6 step up/over/back again x 2/10 RLE Step hang calf stretch x 1' x 2 BLE Step and lunge gait F/B x 15' x 4 Supine hip flexor/knee flexion stretch x 1' x 3 SKTC knee flexion stretch x 1'   NEUROMUSCULAR RE-EDUCATION: To improve proprioception and balance. Resisted gait GTB and RTB together at counter F/B X 3 laps;  B/F X 3 laps;  S/S x 3 laps each direction Ball toss walking F/B x 2' Ball kick in corner alternating LLE and RLE x 2' SLS on level ground EO x 1';  EC x 1' Tandem gait F/B x 15' x 4 laps Carioca gait alternating in front and behind x 15' x 4 laps  06/13/24 Nustep L5x49min UE/LE HS curls BLE 20lb x 20; R/L 10lb 2x10 Knee extension 10lb x 20 BLE; 10lb x 10 R/L LEFS: 63 / 80 = 78.8 % Assessed stairs: ascending reciprocal pattern with HR; descending step to leading with LLE Gait 600' w/o AD SBA- mild antaglic gait  Leg press  BLE 20lb 2x10 BLE; 10lb R/L 2x10 Clock reaching R/L 12 to 6 o'clock x 5  Sit to stands from low mat table x 10     06/10/24 THERAPEUTIC EXERCISE: To improve ROM.  Demonstration, verbal and tactile cues throughout for technique. Bike L0 x 8' some rocking and some full revolutions  THERAPEUTIC ACTIVITIES: To improve functional performance.  Demonstration, verbal and tactile cues throughout for technique. 6 Step up and over and backward up and over x 3/5 LLE Step hang calf stretch x  1' x 2 BLE  NEUROMUSCULAR RE-EDUCATION: To improve strength, proprioception, and balance. Clock/star touch 12:00-6:00 standing on LLE x 5 circuits Long foam sidestepping x 5 laps Long foam tandem gait F/B x 5 laps Long foam toe raises x 20 BLE Resisted gait GTB doubled  at mat table F/B X 3 laps;  B/F X 3 laps;  S/S x 3 laps each direction   06/06/24 Nustep L4x44min UE/LE Bike x 20 full  revolutions backwards   [facility] PT Assessment - 06/06/24 0001       Standardized Balance Assessment   Standardized Balance Assessment Dynamic Gait Index      Dynamic Gait Index   Level Surface Mild Impairment    Change in Gait Speed Mild Impairment    Gait with Horizontal Head Turns Normal    Gait with Vertical Head Turns Normal    Gait and Pivot Turn Normal    Step Over Obstacle Mild Impairment    Step Around Obstacles Normal    Steps Mild Impairment    Total Score 20         Step ups BLE 6' 2x10 Lateral step ups BLE 6' 2x10 Step ups BLE on foam with SLS for 3 sec x 10 Step up and over foam roll fwd and backward x 10 Measured AROM L knee    06/03/24 Bike Partial rev x 8 min Leg curls 15lb x 20 BLE; 5lb x 20; 10lb x 20 Leg extension 5lb 2x10 BLE  KT tape chondromalacia to L Knee- removed mid session, d/t pt noting restriction   TUG test- 13.48 with SPC 5xSTS- 16.88 hands braced on knees Functional squats at counter 2 x 10  MODALITIES: Vasopneumatic ice x 10' to L knee at minimal compression x 34 degrees  05/31/24 THERAPEUTIC EXERCISE: To improve ROM.  Demonstration, verbal and tactile cues throughout for technique. Bike L0 partial ROM rocking back and forth x 7'  THERAPEUTIC ACTIVITIES: To improve functional performance.  Demonstration, verbal and tactile cues throughout for technique. 4 step ups F x 10;  S x 10 LLE Aerobic step calf stretch x 1' Seated knee extension 5# x 2/10 LLE Seated knee flexion 10# x 2/10 LLE Seated L knee flexion stretch with contralateral overpressure from the RLE  NEUROMUSCULAR RE-EDUCATION: To improve strength, proprioception, and balance. BOSU ball step stance lunges x 2/10 LLE Spanish squats black TB x 10 BLE  MANUAL THERAPY: To promote reduced pain and reduced edema utilizing kinesiotaping. KT tape to L knee chondromalacia pattern w/ 2 encircling I strips  05/28/24 THERAPEUTIC EXERCISE: To improve ROM.  Demonstration, verbal and  tactile cues throughout for technique. Bike L0 partial ROM rocking back and forth x 7'  THERAPEUTIC ACTIVITIES: To improve functional performance.  Demonstration, verbal and tactile cues throughout for technique. LAQ 3# x 3/10 LLE Seated knee flexion RTB x 2/10 LLE Sit n slide flexion stretch x 1' x 3 LLE Supine SKTC flexion stretch x 1' x 3 LLE Standing  gastroc stretch on 1/2 foam roll x 1' x 2 LLE Standing soleus stretch on 1/2 foam roll x 1' x 2 LLE  NEUROMUSCULAR RE-EDUCATION: To improve proprioception and balance. On airex:  EC x 1'  Heel to toe rocking x 20 BLE  Minisquat x 2/10 BLE  Marching x 2/10 BLE  Step ups x 2/10 LLE MANUAL THERAPY: To promote improved flexibility utilizing myofascial release and scar mobilization. MFR to L IT band distally, cross friction massage to L knee incision  MODALITIES: Vasopneumatic ice x 13' to L knee at minimal compression x 34 degrees  05/22/24 Nustep L3x43min UE/LE Gait around clinic with SPC 180 ft Seated L knee flexion 2x10 with slider Supine leg lengtheners towel under heel 2x10  Standing heel/toe raises x 20 Standing hip abduction x 10 BLE Standing marching BLE x 10  Standing hip extension BLE x 10 Standing L knee flexion stretch on 8 inch step 2x10  Vaso to L knee 34 deg compression med x 6 min 05/20/24 Nustep L2x80min UE/LE Standing L knee flexion 4 inch 2x10 Standing heel raises from 4 inch step x 12 Supine QS with therapist hand under knee 2x8 Supine heel slides with strap 2x10 Longsitting gastroc stretch with strap 2x30 sec Assisted SLR in supine x 10  05/15/24 Therapeutic Exercise: To improve strength, ROM, and flexibility.  Nustep L2x44min UE/LE Seated heel slides L knee 2x10 Supine heel slides feet on peanut ball 2x10 with strap Supine AP legs elevated on peanut ball x 20  Gait Training: To normalize gait pattern and improve safety.  With RW- 3 laps around clinic 270 ft- cues for heel strike, knee flexion on  swing  Neuromuscular Re-Ed: To improve coordination and proprioception.  Standing LLE while holding onto walker: Marching 2 x 10 Hip abduction 2 x 10 Hip extension 2 x 10 HS curl 2 x 10 Seated LLE SAQ knee on small ball 2x10  MODALITIES: Vasopneumatic ice x 10' to L knee at minimal compression x 34 degrees 05/13/24 SELF CARE: Provided education on PT POC progression and on post-surgical precautions.initial HEP  MODALITIES: Vasopneumatic ice x 13' to L knee at minimal compression x 34 degrees  PATIENT EDUCATION:  Education details: working on step ups at home as well as SKTC flexion stretch for ROM improvement  Person educated: Patient Education method: Programmer, Multimedia, Facilities Manager, Verbal cues, Tactile cues, Handouts, and MedBridgeGO app access provided Education comprehension: verbalized understanding, verbal cues required, tactile cues required, and needs further education  HOME EXERCISE PROGRAM: Access Code: [email or web address] URL: [email or web address] Date: 05/28/2024 Prepared by: Garnette Montclair  Exercises - Supine Ankle Pumps  - 1 x daily - 7 x weekly - 3 sets - 10 reps - Supine Quad Set  - 1 x daily - 7 x weekly - 3 sets - 10 reps - Supine Heel Slides  - 1 x daily - 7 x weekly - 3 sets - 10 reps - Supine Short Arc Quad  - 1 x daily - 7 x weekly - 3 sets - 10 reps - Supine Straight Leg Raises  - 1 x daily - 7 x weekly - 1-2 sets - 10 reps - Standing Ankle Dorsiflexion with Table Support  - 1 x daily - 7 x weekly - 3 sets - 10 reps - Standing Heel Raises  - 1 x daily - 7 x weekly - 3 sets - 10 reps - Seated Knee Extension Stretch with Chair  - 1 x daily - 7 x weekly - 1 sets -  3 reps - 3-5 min hold - Seated Knee Flexion Stretch  - 1 x daily - 7 x weekly - 1 sets - 2-3 reps - 1 min hold - Prone Knee Flexion  - 1 x daily - 7 x weekly - 3 sets - 10 reps - Prone Hip Extension  - 1 x daily - 7 x weekly - 3 sets - 10 reps - Standing Gastroc Stretch on Foam 1/2 Roll  - 1  x daily - 7 x weekly - 1 sets - 2 reps - 1 min hold - Standing Soleus Stretch on Foam 1/2 Roll  - 1 x daily - 7 x weekly - 1 sets - 2 reps -  hold  ASSESSMENT:  CLINICAL IMPRESSION:  Patient continues to make progress each week.  R knee flexion is still her most difficult impairment to overcome.  She is getting 100 degrees now, but would like to see her get at least 110 degrees for normal functional activities such as squatting to do housework at home.  Her balance continues to improve so we progressed to higher level things like carioca.  She has some difficulty with this as well as walking lunges revealing residual balance and strength deficits.   PT remains necessary for ROM, strength, balance, gait.   Continue per POC  EVAL:  Ann Foster is a 59 y.o. female who was referred to physical therapy for evaluation and treatment for L TKA.  She is well known to us  from recent R TKA.  Surgery was 12/2, so she is 2 weeks post op.   Surprisingly she is not as stiff as we would have expected for 2 weeks without PT.   ROM is -5 to 85 degrees.    Pain is worse with end range flexion or extension of the L knee and with prolonged ambulation.  Patient has deficits in L knee ROM, LLE strength, walking,balance, edema, and pain which are interfering with ADLs and are impacting quality of life.  On LEFS patient scored 30/80 demonstrating moderate functional limitation.  Adiel will benefit from skilled PT to address above deficits to improve mobility and activity tolerance with decreased pain interference.   ---OF NOTE--the patient began to c/o calf pain at the end of vasopneumatic treatment today.   She relates the pain is posterolateral at the popliteal fossa and just distal to the fibular head on the upper lateral calf.   She states she has been having this pain since surgery.  However, she reports her eliquis  was D/C early due to bruising so she has not been taking it.   Contacted Dr Benjiman office and informed  them of the above information.  Patient will f/u with their office tomorrow for bandage change/removal and is advised to discuss this pain with them.  She is advised to call 911 if she experiences any chest pain, SOB, diaphoresis, etc.    OBJECTIVE IMPAIRMENTS: difficulty walking, decreased ROM, decreased strength, increased edema, and pain.   ACTIVITY LIMITATIONS: carrying, lifting, bending, standing, squatting, stairs, and locomotion level  PARTICIPATION LIMITATIONS: meal prep, cleaning, laundry, driving, shopping, and community activity  PERSONAL FACTORS: Age and 1-2 comorbidities: DM, HTN, bronchitis, R TKA are also affecting patient's functional outcome.   REHAB POTENTIAL: Good  CLINICAL DECISION MAKING: Evolving/moderate complexity  EVALUATION COMPLEXITY: Moderate   GOALS: Goals reviewed with patient? Yes  SHORT TERM GOALS: Target date: 06/10/2024   Patient will be independent with initial HEP. Baseline: 100% PT assist required for correct  completion 05/28/24:  patient can teach back 100% Goal status: MET  2.  Patient will report at least 25% improvement in L knee pain to improve QOL. Baseline: 8/10 worst 05/28/24:  7/10 06/17/24:  0/10 for the most part except for with end range knee flexion RLE Goal status: MET  3.  Patient will ambulate with a SPC or no device indoors on level ground independently with good balance Baseline: walker dependent with SBA  05/28/24:  SPC with mild limp 06/17/24:   no device in clinic with slight limp Goal status: IN PROGRESS   LONG TERM GOALS: Target date: 07/08/2024   Patient will be independent with advanced/ongoing HEP to improve outcomes and carryover.  Baseline: no advanced HEP yet 05/28/24: advanced today  Goal status: IN PROGRESS  2.  Patient will report at least 50-75% improvement in L knee pain to improve QOL. Baseline: 8/10 worst 06/10/24:  2/10 worst Goal status: MET  3.  Patient will demonstrate improved L knee AROM to  >/= 0-130 deg to allow for normal gait and stair mechanics. Baseline: Refer to above LE ROM table 05/28/24:  see ROM table above; -4 to 95 deg 06/10/24:  -4 to 100 deg Goal status: IN PROGRESS  4.  Patient will demonstrate improved LLE strength to >/= 5/5 for improved stability and ease of mobility. Baseline: Refer to above LE MMT table Goal status: INITIAL  5.  Patient will be able to ambulate 30' with no device and normal gait pattern without increased pain to access community.  Baseline: 5 min 06/10/24:  patient reports can ambulate 20 minutes at [facility] using her cane Goal status: IN PROGRESS- 06/13/24  6. Patient will be able to ascend/descend stairs with 1 HR and reciprocal step pattern safely to access home and community.  Baseline: 1 step at a time holding rail leading with RLE only going up/LLE only going down Goal status: IN PROGRESS- 06/13/24   7.  Patient will report >/= 50/80 on LEFS (MCID = 9 pts) to demonstrate improved functional ability. Baseline: 30/80 Goal status: MET- 06/13/24 -- 63 / 80 = 78.8 %  8.  Patient will demonstrate at least 19/24 on DGI to decrease risk of falls. Baseline: TBD 06/06/24:  20/24 Goal status: MET-  9.  Patient will improve on TUG test to </= 14 sec to reduce fall risk Baseline: 25.61 sec 06/03/24:  13.48 sec Goal status: IN PROGRESS  10.  Patient will improve on 5X STS to </= to 12 sec to demonstrate improved glut/quad strength for independence with transfers from low seating surfaces   Baseline:  26.49 sec 06/06/24:  16.88 sec   Goal status:  progressing-   PLAN:  PT FREQUENCY: 1-2x/week  PT DURATION: 8 weeks  PLANNED INTERVENTIONS: 97750- Physical Performance Testing, 97110-Therapeutic exercises, 97530- Therapeutic activity, 97112- Neuromuscular re-education, 97535- Self Care, [id number]- Manual therapy, [id number]- Electrical stimulation (unattended), 97016- Vasopneumatic device, 20560 (1-2 muscles), 20561 (3+ muscles)- Dry Needling,  Patient/Family education, Taping, Joint mobilization, Cryotherapy, and Moist heat  PLAN FOR NEXT SESSION:  continue to work on L knee flexion ROM and progressive dynamic balance activities  Lynlee Stratton, PT 06/17/2024, 3:37 PM  "

## 2024-06-17 ENCOUNTER — Encounter: Payer: Self-pay | Admitting: Rehabilitation

## 2024-06-17 ENCOUNTER — Ambulatory Visit: Admitting: Rehabilitation

## 2024-06-17 DIAGNOSIS — M25561 Pain in right knee: Secondary | ICD-10-CM | POA: Diagnosis not present

## 2024-06-17 DIAGNOSIS — R600 Localized edema: Secondary | ICD-10-CM

## 2024-06-17 DIAGNOSIS — R262 Difficulty in walking, not elsewhere classified: Secondary | ICD-10-CM

## 2024-06-17 DIAGNOSIS — M6281 Muscle weakness (generalized): Secondary | ICD-10-CM

## 2024-06-17 DIAGNOSIS — M25661 Stiffness of right knee, not elsewhere classified: Secondary | ICD-10-CM

## 2024-06-20 ENCOUNTER — Ambulatory Visit

## 2024-06-20 DIAGNOSIS — R262 Difficulty in walking, not elsewhere classified: Secondary | ICD-10-CM

## 2024-06-20 DIAGNOSIS — M6281 Muscle weakness (generalized): Secondary | ICD-10-CM

## 2024-06-20 DIAGNOSIS — M25661 Stiffness of right knee, not elsewhere classified: Secondary | ICD-10-CM

## 2024-06-20 DIAGNOSIS — R600 Localized edema: Secondary | ICD-10-CM

## 2024-06-20 DIAGNOSIS — M25561 Pain in right knee: Secondary | ICD-10-CM

## 2024-06-20 NOTE — Therapy (Signed)
 " OUTPATIENT PHYSICAL THERAPY LOWER EXTREMITY TREATMENT     Patient Name: Ann Foster MRN: [id number] DOB:[birth year], 59 y.o., female Today's Date: 06/20/2024   END OF SESSION:  PT End of Session - 06/20/24 1102     Visit Number 12    Date for Recertification  07/08/24    PT Start Time 1020    PT Stop Time 1100    PT Time Calculation (min) 40 min    Activity Tolerance Patient tolerated treatment well;No increased pain    Behavior During Therapy [facility] for tasks assessed/performed                    Past Medical History:  Diagnosis Date   Ankle fracture 2016   Right   Aortic atherosclerosis    trace calcific atherosclerosis aortic arch per ct neck done 12-22-17   Bronchitis    Diabetes mellitus without complication (HCC)    GERD (gastroesophageal reflux disease)    Hypertension    OA (osteoarthritis) of shoulder    left shoulder, both knees arthritis   Osteoarthritis, knee    Sleep apnea    wears CPAP   Past Surgical History:  Procedure Laterality Date   CHOLECYSTECTOMY N/A 04/15/2023   Procedure: LAPAROSCOPIC CHOLECYSTECTOMY WITH ICG DYE;  Surgeon: Ebbie Cough, MD;  Location: [facility];  Service: General;  Laterality: N/A;   COLONOSCOPY     TOTAL KNEE ARTHROPLASTY Right 11/20/2023   Procedure: ARTHROPLASTY, KNEE, TOTAL RIGHT;  Surgeon: Jerri Kay HERO, MD;  Location: [facility];  Service: Orthopedics;  Laterality: Right;   TOTAL KNEE ARTHROPLASTY Left 04/29/2024   Procedure: ARTHROPLASTY, KNEE, TOTAL;  Surgeon: Jerri Kay HERO, MD;  Location: [facility];  Service: Orthopedics;  Laterality: Left;   Patient Active Problem List   Diagnosis Date Noted   Status post total left knee replacement 04/29/2024   Primary osteoarthritis of left knee 02/22/2024   Status post total right knee replacement 11/20/2023   Snoring 06/07/2023   Acute cholecystitis 04/13/2023   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, with  long-term current use of insulin  (HCC) 04/25/2022   Amblyopia of eye, right 07/27/2021   Corneal guttata of left eye 07/27/2021   Influenza vaccine refused 07/29/2020   COVID-19 vaccine dose declined 07/29/2020   Vasomotor symptoms due to menopause 04/24/2020   Atrophic vaginitis 04/24/2020   Impingement syndrome of left shoulder 01/23/2018    PCP: Theotis Haze ORN, NP   REFERRING PROVIDER: Jule Ronal CROME, PA-C   REFERRING DIAG: [phone] (ICD-10-CM) - Hx of total knee replacement, left  THERAPY DIAG:  Acute pain of right knee  Stiffness of right knee, not elsewhere classified  Difficulty in walking, not elsewhere classified  Muscle weakness (generalized)  Localized edema  RATIONALE FOR EVALUATION AND TREATMENT: Rehabilitation  ONSET DATE: 04/30/24  NEXT MD VISIT: 05/13/24   SUBJECTIVE:  SUBJECTIVE STATEMENT:  No pain today  EVAL:  59 y/o patient referred to PT for L TKA by Dr Jerri on 04/30/24.  She is well known to us  from recent R TKA.   She is 2 weeks post op tomorrow.   Unclear why it took so long for her to get into see us .   She did not have any HH PT.   She reports she has been doing some of her home exercises from her R TKA on her own at home.  She has an Aquacel bandage in place over the L knee incision that is going to be removed/changed tomorrow when she f/u with Dr Jerri.  The bandage is loose, but it has not loosened to the inner square yet so the incision is not exposed.   Patient is advised that if it does come loose exposing the incision that she is call MD office and report.   She is also recommended not to shower or get the bandage wet until after MD appointment tomorrow, since it is just barely intact.     PAIN: Are you having pain? Yes: NPRS scale: 0/10 Pain  location: L knee Pain description: aching/sore always,  sharp at times Aggravating factors: prolonged walking, end range stretching into flexion/extension Relieving factors: rest, ice, muscle relaxers  PERTINENT HISTORY:  DM, HTN, bronchitis, R TKA  PRECAUTIONS: None  RED FLAGS: None  WEIGHT BEARING RESTRICTIONS: No  FALLS:  Has patient fallen in last 6 months? No  LIVING ENVIRONMENT: Lives with: lives with their family Lives in: House/apartment Stairs: Yes: External: 1 steps; none Has following equipment at home: Single point cane, Walker - 2 wheeled, and bed side commode  OCCUPATION: retired  PLOF: Independent with gait  PATIENT GOALS: get back 100%    OBJECTIVE: (objective measures completed at initial evaluation unless otherwise dated)  DIAGNOSTIC FINDINGS:  N/A  PATIENT SURVEYS:  LEFS  Extreme difficulty/unable (0), Quite a bit of difficulty (1), Moderate difficulty (2), Little difficulty (3), No difficulty (4) Survey date:  05/13/24  Any of your usual work, housework or school activities 1  2. Usual hobbies, recreational or sporting activities 1  3. Getting into/out of the bath 3  4. Walking between rooms 3  5. Putting on socks/shoes 3  6. Squatting  1  7. Lifting an object, like a bag of groceries from the floor 1  8. Performing light activities around your home 2  9. Performing heavy activities around your home 1  10. Getting into/out of a car 2  11. Walking 2 blocks 1  12. Walking 1 mile 0  13. Going up/down 10 stairs (1 flight) 2  14. Standing for 1 hour 3  15.  sitting for 1 hour 3  16. Running on even ground 0  17. Running on uneven ground 0  18. Making sharp turns while running fast 0  19. Hopping  0  20. Rolling over in bed 3  Score total:  30/80     COGNITION: Overall cognitive status: Within functional limits for tasks assessed    SENSATION: WFL  EDEMA:  Circumferential: L knee = 48.5 cm;  R knee = 44.0 cm  POSTURE:  No  Significant postural limitations  PALPATION: Tender to palpate around the L knee in general   LOWER EXTREMITY ROM:  Active ROM Right eval Left eval  Hip flexion    Hip extension    Hip abduction    Hip adduction    Hip internal rotation  Hip external rotation    Knee flexion    Knee extension    Ankle dorsiflexion    Ankle plantarflexion    Ankle inversion    Ankle eversion     Passive ROM Right eval Left eval 05/15/24 05/22/24 AROM 05/28/24 LLE AROM 06/06/24 L knee L knee 06/10/24 L Knee 06/17/24  Hip flexion          Hip extension          Hip abduction          Hip adduction          Hip internal rotation          Hip external rotation          Knee flexion 90 85 98 103 95 104 sitting  100 sit and scoot   Knee extension 0 -6 6  -5 4 LAQ    Ankle dorsiflexion          Ankle plantarflexion          Ankle inversion          Ankle eversion          (Blank rows = not tested)  LOWER EXTREMITY MMT:  MMT Right eval Left eval  Hip flexion 5 4-  Hip extension    Hip abduction 4+ 3+  Hip adduction    Hip internal rotation 4+ 4-  Hip external rotation 5 4  Knee flexion 5 4  Knee extension 5 3+  Ankle dorsiflexion 5 4+  Ankle plantarflexion    Ankle inversion    Ankle eversion     (Blank rows = not tested)  LOWER EXTREMITY SPECIAL TESTS:  N/a  FUNCTIONAL TESTS:  5X STS = 26.49 sec TUG = 25.61 sec  GAIT: Distance walked: into clinic x 150' Assistive device utilized: Environmental Consultant - 2 wheeled Level of assistance: SBA Gait pattern: decreased step length- Left and decreased stance time- Left Comments: antalgic, decreased knee flexion in swing, decreased TKE at heel strike   TODAY'S TREATMENT:  06/20/24 Bike for 8 min- full rev Clocks 12 to 6 o'clock BLE 3x standing on foam Tandem gait F/B x 15' x 4 laps Long foam sidesteps 4x each way Standing on long foam: EC 3x20 sec each time EC with head turns and head nods x 5  Tandem gait long foam 3x- with UE  support Step up and over 6 x 10 BLE Sliding lunges BLE x 10  06/17/24 THERAPEUTIC EXERCISE: To improve strength, endurance, and ROM.  Demonstration, verbal and tactile cues throughout for technique. Bike x 8' some full ROM, some partial  THERAPEUTIC ACTIVITIES: To improve functional performance.  Demonstration, verbal and tactile cues throughout for technique. 6 step up/over/back again x 2/10 RLE Step hang calf stretch x 1' x 2 BLE Step and lunge gait F/B x 15' x 4 Supine hip flexor/knee flexion stretch x 1' x 3 SKTC knee flexion stretch x 1'   NEUROMUSCULAR RE-EDUCATION: To improve proprioception and balance. Resisted gait GTB and RTB together at counter F/B X 3 laps;  B/F X 3 laps;  S/S x 3 laps each direction Ball toss walking F/B x 2' Ball kick in corner alternating LLE and RLE x 2' SLS on level ground EO x 1';  EC x 1' Tandem gait F/B x 15' x 4 laps [place] gait alternating in front and behind x 15' x 4 laps  06/13/24 Nustep L5x58min UE/LE HS curls BLE 20lb x 20; R/L 10lb 2x10 Knee  extension 10lb x 20 BLE; 10lb x 10 R/L LEFS: 63 / 80 = 78.8 % Assessed stairs: ascending reciprocal pattern with HR; descending step to leading with LLE Gait 600' w/o AD SBA- mild antaglic gait  Leg press BLE 20lb 2x10 BLE; 10lb R/L 2x10 Clock reaching R/L 12 to 6 o'clock x 5  Sit to stands from low mat table x 10     06/10/24 THERAPEUTIC EXERCISE: To improve ROM.  Demonstration, verbal and tactile cues throughout for technique. Bike L0 x 8' some rocking and some full revolutions  THERAPEUTIC ACTIVITIES: To improve functional performance.  Demonstration, verbal and tactile cues throughout for technique. 6 Step up and over and backward up and over x 3/5 LLE Step hang calf stretch x  1' x 2 BLE  NEUROMUSCULAR RE-EDUCATION: To improve strength, proprioception, and balance. Clock/star touch 12:00-6:00 standing on LLE x 5 circuits Long foam sidestepping x 5 laps Long foam tandem gait F/B x 5  laps Long foam toe raises x 20 BLE Resisted gait GTB doubled  at mat table F/B X 3 laps;  B/F X 3 laps;  S/S x 3 laps each direction   06/06/24 Nustep L4x79min UE/LE Bike x 20 full revolutions backwards   [facility] PT Assessment - 06/06/24 0001       Standardized Balance Assessment   Standardized Balance Assessment Dynamic Gait Index      Dynamic Gait Index   Level Surface Mild Impairment    Change in Gait Speed Mild Impairment    Gait with Horizontal Head Turns Normal    Gait with Vertical Head Turns Normal    Gait and Pivot Turn Normal    Step Over Obstacle Mild Impairment    Step Around Obstacles Normal    Steps Mild Impairment    Total Score 20         Step ups BLE 6' 2x10 Lateral step ups BLE 6' 2x10 Step ups BLE on foam with SLS for 3 sec x 10 Step up and over foam roll fwd and backward x 10 Measured AROM L knee    06/03/24 Bike Partial rev x 8 min Leg curls 15lb x 20 BLE; 5lb x 20; 10lb x 20 Leg extension 5lb 2x10 BLE  KT tape chondromalacia to L Knee- removed mid session, d/t pt noting restriction   TUG test- 13.48 with SPC 5xSTS- 16.88 hands braced on knees Functional squats at counter 2 x 10  MODALITIES: Vasopneumatic ice x 10' to L knee at minimal compression x 34 degrees  05/31/24 THERAPEUTIC EXERCISE: To improve ROM.  Demonstration, verbal and tactile cues throughout for technique. Bike L0 partial ROM rocking back and forth x 7'  THERAPEUTIC ACTIVITIES: To improve functional performance.  Demonstration, verbal and tactile cues throughout for technique. 4 step ups F x 10;  S x 10 LLE Aerobic step calf stretch x 1' Seated knee extension 5# x 2/10 LLE Seated knee flexion 10# x 2/10 LLE Seated L knee flexion stretch with contralateral overpressure from the RLE  NEUROMUSCULAR RE-EDUCATION: To improve strength, proprioception, and balance. BOSU ball step stance lunges x 2/10 LLE Spanish squats black TB x 10 BLE  MANUAL THERAPY: To promote reduced pain and  reduced edema utilizing kinesiotaping. KT tape to L knee chondromalacia pattern w/ 2 encircling I strips  05/28/24 THERAPEUTIC EXERCISE: To improve ROM.  Demonstration, verbal and tactile cues throughout for technique. Bike L0 partial ROM rocking back and forth x 7'  THERAPEUTIC ACTIVITIES: To improve functional performance.  Demonstration, verbal and tactile cues throughout for technique. LAQ 3# x 3/10 LLE Seated knee flexion RTB x 2/10 LLE Sit n slide flexion stretch x 1' x 3 LLE Supine SKTC flexion stretch x 1' x 3 LLE Standing gastroc stretch on 1/2 foam roll x 1' x 2 LLE Standing soleus stretch on 1/2 foam roll x 1' x 2 LLE  NEUROMUSCULAR RE-EDUCATION: To improve proprioception and balance. On airex:  EC x 1'  Heel to toe rocking x 20 BLE  Minisquat x 2/10 BLE  Marching x 2/10 BLE  Step ups x 2/10 LLE MANUAL THERAPY: To promote improved flexibility utilizing myofascial release and scar mobilization. MFR to L IT band distally, cross friction massage to L knee incision  MODALITIES: Vasopneumatic ice x 13' to L knee at minimal compression x 34 degrees  05/22/24 Nustep L3x60min UE/LE Gait around clinic with SPC 180 ft Seated L knee flexion 2x10 with slider Supine leg lengtheners towel under heel 2x10  Standing heel/toe raises x 20 Standing hip abduction x 10 BLE Standing marching BLE x 10  Standing hip extension BLE x 10 Standing L knee flexion stretch on 8 inch step 2x10  Vaso to L knee 34 deg compression med x 6 min 05/20/24 Nustep L2x90min UE/LE Standing L knee flexion 4 inch 2x10 Standing heel raises from 4 inch step x 12 Supine QS with therapist hand under knee 2x8 Supine heel slides with strap 2x10 Longsitting gastroc stretch with strap 2x30 sec Assisted SLR in supine x 10  05/15/24 Therapeutic Exercise: To improve strength, ROM, and flexibility.  Nustep L2x53min UE/LE Seated heel slides L knee 2x10 Supine heel slides feet on peanut ball 2x10 with  strap Supine AP legs elevated on peanut ball x 20  Gait Training: To normalize gait pattern and improve safety.  With RW- 3 laps around clinic 270 ft- cues for heel strike, knee flexion on swing  Neuromuscular Re-Ed: To improve coordination and proprioception.  Standing LLE while holding onto walker: Marching 2 x 10 Hip abduction 2 x 10 Hip extension 2 x 10 HS curl 2 x 10 Seated LLE SAQ knee on small ball 2x10  MODALITIES: Vasopneumatic ice x 10' to L knee at minimal compression x 34 degrees 05/13/24 SELF CARE: Provided education on PT POC progression and on post-surgical precautions.initial HEP  MODALITIES: Vasopneumatic ice x 13' to L knee at minimal compression x 34 degrees  PATIENT EDUCATION:  Education details: working on step ups at home as well as SKTC flexion stretch for ROM improvement  Person educated: Patient Education method: Programmer, Multimedia, Facilities Manager, Verbal cues, Tactile cues, Handouts, and MedBridgeGO app access provided Education comprehension: verbalized understanding, verbal cues required, tactile cues required, and needs further education  HOME EXERCISE PROGRAM: Access Code: [email or web address] URL: [email or web address] Date: 05/28/2024 Prepared by: Garnette Montclair  Exercises - Supine Ankle Pumps  - 1 x daily - 7 x weekly - 3 sets - 10 reps - Supine Quad Set  - 1 x daily - 7 x weekly - 3 sets - 10 reps - Supine Heel Slides  - 1 x daily - 7 x weekly - 3 sets - 10 reps - Supine Short Arc Quad  - 1 x daily - 7 x weekly - 3 sets - 10 reps - Supine Straight Leg Raises  - 1 x daily - 7 x weekly - 1-2 sets - 10 reps - Standing Ankle Dorsiflexion with Table Support  - 1 x daily - 7 x weekly - 3 sets -  10 reps - Standing Heel Raises  - 1 x daily - 7 x weekly - 3 sets - 10 reps - Seated Knee Extension Stretch with Chair  - 1 x daily - 7 x weekly - 1 sets - 3 reps - 3-5 min hold - Seated Knee Flexion Stretch  - 1 x daily - 7 x weekly - 1 sets - 2-3 reps - 1  min hold - Prone Knee Flexion  - 1 x daily - 7 x weekly - 3 sets - 10 reps - Prone Hip Extension  - 1 x daily - 7 x weekly - 3 sets - 10 reps - Standing Gastroc Stretch on Foam 1/2 Roll  - 1 x daily - 7 x weekly - 1 sets - 2 reps - 1 min hold - Standing Soleus Stretch on Foam 1/2 Roll  - 1 x daily - 7 x weekly - 1 sets - 2 reps -  hold  ASSESSMENT:  CLINICAL IMPRESSION:  Pt continues to progress with ROM. Worked on balance activities as tolerated and functional strengthening. Pt had no issues but some challenges with balance today.  PT remains necessary for ROM, strength, balance, gait.   Continue per POC  EVAL:  Ann Foster is a 59 y.o. female who was referred to physical therapy for evaluation and treatment for L TKA.  She is well known to us  from recent R TKA.  Surgery was 12/2, so she is 2 weeks post op.   Surprisingly she is not as stiff as we would have expected for 2 weeks without PT.   ROM is -5 to 85 degrees.    Pain is worse with end range flexion or extension of the L knee and with prolonged ambulation.  Patient has deficits in L knee ROM, LLE strength, walking,balance, edema, and pain which are interfering with ADLs and are impacting quality of life.  On LEFS patient scored 30/80 demonstrating moderate functional limitation.  Brihanna will benefit from skilled PT to address above deficits to improve mobility and activity tolerance with decreased pain interference.   ---OF NOTE--the patient began to c/o calf pain at the end of vasopneumatic treatment today.   She relates the pain is posterolateral at the popliteal fossa and just distal to the fibular head on the upper lateral calf.   She states she has been having this pain since surgery.  However, she reports her eliquis  was D/C early due to bruising so she has not been taking it.   Contacted Dr Benjiman office and informed them of the above information.  Patient will f/u with their office tomorrow for bandage change/removal and is  advised to discuss this pain with them.  She is advised to call 911 if she experiences any chest pain, SOB, diaphoresis, etc.    OBJECTIVE IMPAIRMENTS: difficulty walking, decreased ROM, decreased strength, increased edema, and pain.   ACTIVITY LIMITATIONS: carrying, lifting, bending, standing, squatting, stairs, and locomotion level  PARTICIPATION LIMITATIONS: meal prep, cleaning, laundry, driving, shopping, and community activity  PERSONAL FACTORS: Age and 1-2 comorbidities: DM, HTN, bronchitis, R TKA are also affecting patient's functional outcome.   REHAB POTENTIAL: Good  CLINICAL DECISION MAKING: Evolving/moderate complexity  EVALUATION COMPLEXITY: Moderate   GOALS: Goals reviewed with patient? Yes  SHORT TERM GOALS: Target date: 06/10/2024   Patient will be independent with initial HEP. Baseline: 100% PT assist required for correct completion 05/28/24:  patient can teach back 100% Goal status: MET  2.  Patient will report  at least 25% improvement in L knee pain to improve QOL. Baseline: 8/10 worst 05/28/24:  7/10 06/17/24:  0/10 for the most part except for with end range knee flexion RLE Goal status: MET  3.  Patient will ambulate with a SPC or no device indoors on level ground independently with good balance Baseline: walker dependent with SBA  05/28/24:  SPC with mild limp 06/17/24:   no device in clinic with slight limp Goal status: IN PROGRESS   LONG TERM GOALS: Target date: 07/08/2024   Patient will be independent with advanced/ongoing HEP to improve outcomes and carryover.  Baseline: no advanced HEP yet 05/28/24: advanced today  Goal status: IN PROGRESS  2.  Patient will report at least 50-75% improvement in L knee pain to improve QOL. Baseline: 8/10 worst 06/10/24:  2/10 worst Goal status: MET  3.  Patient will demonstrate improved L knee AROM to >/= 0-130 deg to allow for normal gait and stair mechanics. Baseline: Refer to above LE ROM table 05/28/24:   see ROM table above; -4 to 95 deg 06/10/24:  -4 to 100 deg Goal status: IN PROGRESS  4.  Patient will demonstrate improved LLE strength to >/= 5/5 for improved stability and ease of mobility. Baseline: Refer to above LE MMT table Goal status: INITIAL  5.  Patient will be able to ambulate 30' with no device and normal gait pattern without increased pain to access community.  Baseline: 5 min 06/10/24:  patient reports can ambulate 20 minutes at [facility] using her cane Goal status: IN PROGRESS- 06/13/24  6. Patient will be able to ascend/descend stairs with 1 HR and reciprocal step pattern safely to access home and community.  Baseline: 1 step at a time holding rail leading with RLE only going up/LLE only going down Goal status: IN PROGRESS- 06/13/24   7.  Patient will report >/= 50/80 on LEFS (MCID = 9 pts) to demonstrate improved functional ability. Baseline: 30/80 Goal status: MET- 06/13/24 -- 63 / 80 = 78.8 %  8.  Patient will demonstrate at least 19/24 on DGI to decrease risk of falls. Baseline: TBD 06/06/24:  20/24 Goal status: MET-  9.  Patient will improve on TUG test to </= 14 sec to reduce fall risk Baseline: 25.61 sec 06/03/24:  13.48 sec Goal status: IN PROGRESS  10.  Patient will improve on 5X STS to </= to 12 sec to demonstrate improved glut/quad strength for independence with transfers from low seating surfaces   Baseline:  26.49 sec 06/06/24:  16.88 sec   Goal status:  progressing-   PLAN:  PT FREQUENCY: 1-2x/week  PT DURATION: 8 weeks  PLANNED INTERVENTIONS: 97750- Physical Performance Testing, 97110-Therapeutic exercises, 97530- Therapeutic activity, 97112- Neuromuscular re-education, 97535- Self Care, [id number]- Manual therapy, [id number]- Electrical stimulation (unattended), 97016- Vasopneumatic device, 20560 (1-2 muscles), 20561 (3+ muscles)- Dry Needling, Patient/Family education, Taping, Joint mobilization, Cryotherapy, and Moist heat  PLAN FOR NEXT SESSION:  continue to  work on L knee flexion ROM and progressive dynamic balance activities  Sol LITTIE Gaskins, PTA 06/20/2024, 11:14 AM  "

## 2024-06-24 ENCOUNTER — Ambulatory Visit

## 2024-06-27 ENCOUNTER — Ambulatory Visit: Admitting: Rehabilitation

## 2024-06-27 ENCOUNTER — Encounter: Payer: Self-pay | Admitting: Rehabilitation

## 2024-06-27 DIAGNOSIS — M25561 Pain in right knee: Secondary | ICD-10-CM | POA: Diagnosis not present

## 2024-06-27 DIAGNOSIS — M6281 Muscle weakness (generalized): Secondary | ICD-10-CM

## 2024-06-27 DIAGNOSIS — R600 Localized edema: Secondary | ICD-10-CM

## 2024-06-27 DIAGNOSIS — R262 Difficulty in walking, not elsewhere classified: Secondary | ICD-10-CM

## 2024-06-27 DIAGNOSIS — M25661 Stiffness of right knee, not elsewhere classified: Secondary | ICD-10-CM

## 2024-06-27 NOTE — Therapy (Signed)
 " OUTPATIENT PHYSICAL THERAPY LOWER EXTREMITY TREATMENT     Patient Name: Ann Foster MRN: [id number] DOB:Oct 03, 1965, 59 y.o., female Today's Date: 06/27/2024   END OF SESSION:  PT End of Session - 06/27/24 0946     Visit Number 13    Date for Recertification  07/08/24    PT Start Time 0935    PT Stop Time 1015    PT Time Calculation (min) 40 min    Activity Tolerance Patient tolerated treatment well;No increased pain    Behavior During Therapy [facility] for tasks assessed/performed                    Past Medical History:  Diagnosis Date   Ankle fracture 2016   Right   Aortic atherosclerosis    trace calcific atherosclerosis aortic arch per ct neck done 12-22-17   Bronchitis    Diabetes mellitus without complication (HCC)    GERD (gastroesophageal reflux disease)    Hypertension    OA (osteoarthritis) of shoulder    left shoulder, both knees arthritis   Osteoarthritis, knee    Sleep apnea    wears CPAP   Past Surgical History:  Procedure Laterality Date   CHOLECYSTECTOMY N/A 04/15/2023   Procedure: LAPAROSCOPIC CHOLECYSTECTOMY WITH ICG DYE;  Surgeon: Ebbie Cough, MD;  Location: [facility];  Service: General;  Laterality: N/A;   COLONOSCOPY     TOTAL KNEE ARTHROPLASTY Right 11/20/2023   Procedure: ARTHROPLASTY, KNEE, TOTAL RIGHT;  Surgeon: Jerri Kay HERO, MD;  Location: [facility];  Service: Orthopedics;  Laterality: Right;   TOTAL KNEE ARTHROPLASTY Left 04/29/2024   Procedure: ARTHROPLASTY, KNEE, TOTAL;  Surgeon: Jerri Kay HERO, MD;  Location: [facility];  Service: Orthopedics;  Laterality: Left;   Patient Active Problem List   Diagnosis Date Noted   Status post total left knee replacement 04/29/2024   Primary osteoarthritis of left knee 02/22/2024   Status post total right knee replacement 11/20/2023   Snoring 06/07/2023   Acute cholecystitis 04/13/2023   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, with  long-term current use of insulin  (HCC) 04/25/2022   Amblyopia of eye, right 07/27/2021   Corneal guttata of left eye 07/27/2021   Influenza vaccine refused 07/29/2020   COVID-19 vaccine dose declined 07/29/2020   Vasomotor symptoms due to menopause 04/24/2020   Atrophic vaginitis 04/24/2020   Impingement syndrome of left shoulder 01/23/2018    PCP: Theotis Haze ORN, NP   REFERRING PROVIDER: Jule Ronal CROME, PA-C   REFERRING DIAG: [phone] (ICD-10-CM) - Hx of total knee replacement, left  THERAPY DIAG:  Acute pain of right knee  Stiffness of right knee, not elsewhere classified  Difficulty in walking, not elsewhere classified  Muscle weakness (generalized)  Localized edema  RATIONALE FOR EVALUATION AND TREATMENT: Rehabilitation  ONSET DATE: 04/30/24  NEXT MD VISIT: 05/13/24   SUBJECTIVE:  SUBJECTIVE STATEMENT:  Patient states feel good today.  Denies any falls, med changes, or other problems.   States pain is 0/10 in the L knee.   States really has more trouble with the R knee now.   EVAL:  59 y/o patient referred to PT for L TKA by Dr Jerri on 04/30/24.  She is well known to us  from recent R TKA.   She is 2 weeks post op tomorrow.   Unclear why it took so long for her to get into see us .   She did not have any HH PT.   She reports she has been doing some of her home exercises from her R TKA on her own at home.  She has an Aquacel bandage in place over the L knee incision that is going to be removed/changed tomorrow when she f/u with Dr Jerri.  The bandage is loose, but it has not loosened to the inner square yet so the incision is not exposed.   Patient is advised that if it does come loose exposing the incision that she is call MD office and report.   She is also recommended not to shower  or get the bandage wet until after MD appointment tomorrow, since it is just barely intact.     PAIN: Are you having pain? Yes: NPRS scale: 0/10 Pain location: L knee Pain description: aching/sore always,  sharp at times Aggravating factors: prolonged walking, end range stretching into flexion/extension Relieving factors: rest, ice, muscle relaxers  PERTINENT HISTORY:  DM, HTN, bronchitis, R TKA  PRECAUTIONS: None  RED FLAGS: None  WEIGHT BEARING RESTRICTIONS: No  FALLS:  Has patient fallen in last 6 months? No  LIVING ENVIRONMENT: Lives with: lives with their family Lives in: House/apartment Stairs: Yes: External: 1 steps; none Has following equipment at home: Single point cane, Walker - 2 wheeled, and bed side commode  OCCUPATION: retired  PLOF: Independent with gait  PATIENT GOALS: get back 100%    OBJECTIVE: (objective measures completed at initial evaluation unless otherwise dated)  DIAGNOSTIC FINDINGS:  N/A  PATIENT SURVEYS:  LEFS  Extreme difficulty/unable (0), Quite a bit of difficulty (1), Moderate difficulty (2), Little difficulty (3), No difficulty (4) Survey date:  05/13/24  Any of your usual work, housework or school activities 1  2. Usual hobbies, recreational or sporting activities 1  3. Getting into/out of the bath 3  4. Walking between rooms 3  5. Putting on socks/shoes 3  6. Squatting  1  7. Lifting an object, like a bag of groceries from the floor 1  8. Performing light activities around your home 2  9. Performing heavy activities around your home 1  10. Getting into/out of a car 2  11. Walking 2 blocks 1  12. Walking 1 mile 0  13. Going up/down 10 stairs (1 flight) 2  14. Standing for 1 hour 3  15.  sitting for 1 hour 3  16. Running on even ground 0  17. Running on uneven ground 0  18. Making sharp turns while running fast 0  19. Hopping  0  20. Rolling over in bed 3  Score total:  30/80     COGNITION: Overall cognitive status:  Within functional limits for tasks assessed    SENSATION: WFL  EDEMA:  Circumferential: L knee = 48.5 cm;  R knee = 44.0 cm  POSTURE:  No Significant postural limitations  PALPATION: Tender to palpate around the L knee in general  LOWER EXTREMITY ROM:  Active ROM Right eval Left eval  Hip flexion    Hip extension    Hip abduction    Hip adduction    Hip internal rotation    Hip external rotation    Knee flexion    Knee extension    Ankle dorsiflexion    Ankle plantarflexion    Ankle inversion    Ankle eversion     Passive ROM Right eval Left eval 05/15/24 05/22/24 AROM 05/28/24 LLE AROM 06/06/24 L knee L knee 06/10/24 L Knee 06/17/24 LLE 06/27/24  Hip flexion           Hip extension           Hip abduction           Hip adduction           Hip internal rotation           Hip external rotation           Knee flexion 90 85 98 103 95 104 sitting  100 sit and scoot  105 deg SKTC  Knee extension 0 -6 6  -5 4 LAQ   0 with prone knee extension hang;  3 with supine QS  Ankle dorsiflexion           Ankle plantarflexion           Ankle inversion           Ankle eversion           (Blank rows = not tested)  LOWER EXTREMITY MMT:  MMT Right eval Left eval  Hip flexion 5 4-  Hip extension    Hip abduction 4+ 3+  Hip adduction    Hip internal rotation 4+ 4-  Hip external rotation 5 4  Knee flexion 5 4  Knee extension 5 3+  Ankle dorsiflexion 5 4+  Ankle plantarflexion    Ankle inversion    Ankle eversion     (Blank rows = not tested)  LOWER EXTREMITY SPECIAL TESTS:  N/a  FUNCTIONAL TESTS:  5X STS = 26.49 sec TUG = 25.61 sec  GAIT: Distance walked: into clinic x 150' Assistive device utilized: Environmental Consultant - 2 wheeled Level of assistance: SBA Gait pattern: decreased step length- Left and decreased stance time- Left Comments: antalgic, decreased knee flexion in swing, decreased TKE at heel strike   TODAY'S TREATMENT:  06/27/24 THERAPEUTIC EXERCISE: To improve  strength and ROM.  Demonstration, verbal and tactile cues throughout for technique. Elliptical L0 x 1' x 3 with 1 min RB in between  THERAPEUTIC ACTIVITIES: To improve functional performance.  Demonstration, verbal and tactile cues throughout for technique. Checked TUG and 5X STS Checked ROM Prone extension hangs w/ towel prop x 1' LLE Prone extension hangs with active knee extension x 2/10 LLE Prone knee extension w/ ankle on foam roller x 20 BLE Prone knee flexion x 2/10 LLE Prone knee flexion stretch w/ strap x 1' x 2 LLE Supine QS/SLR x 10 LLE Supine SKTC flexion stretch x 1' x 3 LLE  NEUROMUSCULAR RE-EDUCATION: To improve strength, kinesthesia, proprioception, and balance. Forward lunge with no UE support x 2/10 LLE Reverse lunge with min UE support x 2/10 LLE  06/20/24 Bike for 8 min- full rev Clocks 12 to 6 o'clock BLE 3x standing on foam Tandem gait F/B x 15' x 4 laps Long foam sidesteps 4x each way Standing on long foam: EC 3x20 sec each time EC with head turns  and head nods x 5  Tandem gait long foam 3x- with UE support Step up and over 6 x 10 BLE Sliding lunges BLE x 10  06/17/24 THERAPEUTIC EXERCISE: To improve strength, endurance, and ROM.  Demonstration, verbal and tactile cues throughout for technique. Bike x 8' some full ROM, some partial  THERAPEUTIC ACTIVITIES: To improve functional performance.  Demonstration, verbal and tactile cues throughout for technique. 6 step up/over/back again x 2/10 RLE Step hang calf stretch x 1' x 2 BLE Step and lunge gait F/B x 15' x 4 Supine hip flexor/knee flexion stretch x 1' x 3 SKTC knee flexion stretch x 1'   NEUROMUSCULAR RE-EDUCATION: To improve proprioception and balance. Resisted gait GTB and RTB together at counter F/B X 3 laps;  B/F X 3 laps;  S/S x 3 laps each direction Ball toss walking F/B x 2' Ball kick in corner alternating LLE and RLE x 2' SLS on level ground EO x 1';  EC x 1' Tandem gait F/B x 15' x 4  laps [place] gait alternating in front and behind x 15' x 4 laps  06/13/24 Nustep L5x15min UE/LE HS curls BLE 20lb x 20; R/L 10lb 2x10 Knee extension 10lb x 20 BLE; 10lb x 10 R/L LEFS: 63 / 80 = 78.8 % Assessed stairs: ascending reciprocal pattern with HR; descending step to leading with LLE Gait 600' w/o AD SBA- mild antaglic gait  Leg press BLE 20lb 2x10 BLE; 10lb R/L 2x10 Clock reaching R/L 12 to 6 o'clock x 5  Sit to stands from low mat table x 10     06/10/24 THERAPEUTIC EXERCISE: To improve ROM.  Demonstration, verbal and tactile cues throughout for technique. Bike L0 x 8' some rocking and some full revolutions  THERAPEUTIC ACTIVITIES: To improve functional performance.  Demonstration, verbal and tactile cues throughout for technique. 6 Step up and over and backward up and over x 3/5 LLE Step hang calf stretch x  1' x 2 BLE  NEUROMUSCULAR RE-EDUCATION: To improve strength, proprioception, and balance. Clock/star touch 12:00-6:00 standing on LLE x 5 circuits Long foam sidestepping x 5 laps Long foam tandem gait F/B x 5 laps Long foam toe raises x 20 BLE Resisted gait GTB doubled  at mat table F/B X 3 laps;  B/F X 3 laps;  S/S x 3 laps each direction   06/06/24 Nustep L4x3min UE/LE Bike x 20 full revolutions backwards   [facility] PT Assessment - 06/06/24 0001       Standardized Balance Assessment   Standardized Balance Assessment Dynamic Gait Index      Dynamic Gait Index   Level Surface Mild Impairment    Change in Gait Speed Mild Impairment    Gait with Horizontal Head Turns Normal    Gait with Vertical Head Turns Normal    Gait and Pivot Turn Normal    Step Over Obstacle Mild Impairment    Step Around Obstacles Normal    Steps Mild Impairment    Total Score 20         Step ups BLE 6' 2x10 Lateral step ups BLE 6' 2x10 Step ups BLE on foam with SLS for 3 sec x 10 Step up and over foam roll fwd and backward x 10 Measured AROM L knee    06/03/24 Bike Partial  rev x 8 min Leg curls 15lb x 20 BLE; 5lb x 20; 10lb x 20 Leg extension 5lb 2x10 BLE  KT tape chondromalacia to L Knee- removed mid session,  d/t pt noting restriction   TUG test- 13.48 with SPC 5xSTS- 16.88 hands braced on knees Functional squats at counter 2 x 10  MODALITIES: Vasopneumatic ice x 10' to L knee at minimal compression x 34 degrees  05/31/24 THERAPEUTIC EXERCISE: To improve ROM.  Demonstration, verbal and tactile cues throughout for technique. Bike L0 partial ROM rocking back and forth x 7'  THERAPEUTIC ACTIVITIES: To improve functional performance.  Demonstration, verbal and tactile cues throughout for technique. 4 step ups F x 10;  S x 10 LLE Aerobic step calf stretch x 1' Seated knee extension 5# x 2/10 LLE Seated knee flexion 10# x 2/10 LLE Seated L knee flexion stretch with contralateral overpressure from the RLE  NEUROMUSCULAR RE-EDUCATION: To improve strength, proprioception, and balance. BOSU ball step stance lunges x 2/10 LLE Spanish squats black TB x 10 BLE  MANUAL THERAPY: To promote reduced pain and reduced edema utilizing kinesiotaping. KT tape to L knee chondromalacia pattern w/ 2 encircling I strips  05/28/24 THERAPEUTIC EXERCISE: To improve ROM.  Demonstration, verbal and tactile cues throughout for technique. Bike L0 partial ROM rocking back and forth x 7'  THERAPEUTIC ACTIVITIES: To improve functional performance.  Demonstration, verbal and tactile cues throughout for technique. LAQ 3# x 3/10 LLE Seated knee flexion RTB x 2/10 LLE Sit n slide flexion stretch x 1' x 3 LLE Supine SKTC flexion stretch x 1' x 3 LLE Standing gastroc stretch on 1/2 foam roll x 1' x 2 LLE Standing soleus stretch on 1/2 foam roll x 1' x 2 LLE  NEUROMUSCULAR RE-EDUCATION: To improve proprioception and balance. On airex:  EC x 1'  Heel to toe rocking x 20 BLE  Minisquat x 2/10 BLE  Marching x 2/10 BLE  Step ups x 2/10 LLE MANUAL THERAPY: To promote improved  flexibility utilizing myofascial release and scar mobilization. MFR to L IT band distally, cross friction massage to L knee incision  MODALITIES: Vasopneumatic ice x 13' to L knee at minimal compression x 34 degrees  05/22/24 Nustep L3x45min UE/LE Gait around clinic with SPC 180 ft Seated L knee flexion 2x10 with slider Supine leg lengtheners towel under heel 2x10  Standing heel/toe raises x 20 Standing hip abduction x 10 BLE Standing marching BLE x 10  Standing hip extension BLE x 10 Standing L knee flexion stretch on 8 inch step 2x10  Vaso to L knee 34 deg compression med x 6 min 05/20/24 Nustep L2x7min UE/LE Standing L knee flexion 4 inch 2x10 Standing heel raises from 4 inch step x 12 Supine QS with therapist hand under knee 2x8 Supine heel slides with strap 2x10 Longsitting gastroc stretch with strap 2x30 sec Assisted SLR in supine x 10  05/15/24 Therapeutic Exercise: To improve strength, ROM, and flexibility.  Nustep L2x66min UE/LE Seated heel slides L knee 2x10 Supine heel slides feet on peanut ball 2x10 with strap Supine AP legs elevated on peanut ball x 20  Gait Training: To normalize gait pattern and improve safety.  With RW- 3 laps around clinic 270 ft- cues for heel strike, knee flexion on swing  Neuromuscular Re-Ed: To improve coordination and proprioception.  Standing LLE while holding onto walker: Marching 2 x 10 Hip abduction 2 x 10 Hip extension 2 x 10 HS curl 2 x 10 Seated LLE SAQ knee on small ball 2x10  MODALITIES: Vasopneumatic ice x 10' to L knee at minimal compression x 34 degrees 05/13/24 SELF CARE: Provided education on PT POC progression and on  post-surgical precautions.initial HEP  MODALITIES: Vasopneumatic ice x 13' to L knee at minimal compression x 34 degrees  PATIENT EDUCATION:  Education details: working on step ups at home as well as SKTC flexion stretch for ROM improvement  Person educated: Patient Education method: Programmer, Multimedia,  Facilities Manager, Verbal cues, Tactile cues, Handouts, and MedBridgeGO app access provided Education comprehension: verbalized understanding, verbal cues required, tactile cues required, and needs further education  HOME EXERCISE PROGRAM: Access Code: [email or web address] URL: [email or web address] Date: 05/28/2024 Prepared by: Garnette Montclair  Exercises - Supine Ankle Pumps  - 1 x daily - 7 x weekly - 3 sets - 10 reps - Supine Quad Set  - 1 x daily - 7 x weekly - 3 sets - 10 reps - Supine Heel Slides  - 1 x daily - 7 x weekly - 3 sets - 10 reps - Supine Short Arc Quad  - 1 x daily - 7 x weekly - 3 sets - 10 reps - Supine Straight Leg Raises  - 1 x daily - 7 x weekly - 1-2 sets - 10 reps - Standing Ankle Dorsiflexion with Table Support  - 1 x daily - 7 x weekly - 3 sets - 10 reps - Standing Heel Raises  - 1 x daily - 7 x weekly - 3 sets - 10 reps - Seated Knee Extension Stretch with Chair  - 1 x daily - 7 x weekly - 1 sets - 3 reps - 3-5 min hold - Seated Knee Flexion Stretch  - 1 x daily - 7 x weekly - 1 sets - 2-3 reps - 1 min hold - Prone Knee Flexion  - 1 x daily - 7 x weekly - 3 sets - 10 reps - Prone Hip Extension  - 1 x daily - 7 x weekly - 3 sets - 10 reps - Standing Gastroc Stretch on Foam 1/2 Roll  - 1 x daily - 7 x weekly - 1 sets - 2 reps - 1 min hold - Standing Soleus Stretch on Foam 1/2 Roll  - 1 x daily - 7 x weekly - 1 sets - 2 reps -  hold  ASSESSMENT:  CLINICAL IMPRESSION:  Patient has made excellent progress to goals.   Her TUG and 5X STS goals are met.   Her pain has resolved.   She demonstrates stair negotiation with reciprocal pattern and minimal hand rail support.   She is pleased with her progress and will be ready to D/C PT on next visit   EVAL:  Laquasia Pincus is a 59 y.o. female who was referred to physical therapy for evaluation and treatment for L TKA.  She is well known to us  from recent R TKA.  Surgery was 12/2, so she is 2 weeks post op.    Surprisingly she is not as stiff as we would have expected for 2 weeks without PT.   ROM is -5 to 85 degrees.    Pain is worse with end range flexion or extension of the L knee and with prolonged ambulation.  Patient has deficits in L knee ROM, LLE strength, walking,balance, edema, and pain which are interfering with ADLs and are impacting quality of life.  On LEFS patient scored 30/80 demonstrating moderate functional limitation.  Shan[place]wa will benefit from skilled PT to address above deficits to improve mobility and activity tolerance with decreased pain interference.   ---OF NOTE--the patient began to c/o calf pain at the end of vasopneumatic treatment today.   She  relates the pain is posterolateral at the popliteal fossa and just distal to the fibular head on the upper lateral calf.   She states she has been having this pain since surgery.  However, she reports her eliquis  was D/C early due to bruising so she has not been taking it.   Contacted Dr Benjiman office and informed them of the above information.  Patient will f/u with their office tomorrow for bandage change/removal and is advised to discuss this pain with them.  She is advised to call 911 if she experiences any chest pain, SOB, diaphoresis, etc.    OBJECTIVE IMPAIRMENTS: difficulty walking, decreased ROM, decreased strength, increased edema, and pain.   ACTIVITY LIMITATIONS: carrying, lifting, bending, standing, squatting, stairs, and locomotion level  PARTICIPATION LIMITATIONS: meal prep, cleaning, laundry, driving, shopping, and community activity  PERSONAL FACTORS: Age and 1-2 comorbidities: DM, HTN, bronchitis, R TKA are also affecting patient's functional outcome.   REHAB POTENTIAL: Good  CLINICAL DECISION MAKING: Evolving/moderate complexity  EVALUATION COMPLEXITY: Moderate   GOALS: Goals reviewed with patient? Yes  SHORT TERM GOALS: Target date: 06/10/2024   Patient will be independent with initial HEP. Baseline: 100% PT  assist required for correct completion 05/28/24:  patient can teach back 100% Goal status: MET  2.  Patient will report at least 25% improvement in L knee pain to improve QOL. Baseline: 8/10 worst 05/28/24:  7/10 06/17/24:  0/10 for the most part except for with end range knee flexion RLE Goal status: MET  3.  Patient will ambulate with a SPC or no device indoors on level ground independently with good balance Baseline: walker dependent with SBA  05/28/24:  SPC with mild limp 06/17/24:   no device in clinic with slight limp 06/27/24:   independent gait with no device, no limp Goal status: MET   LONG TERM GOALS: Target date: 07/08/2024   Patient will be independent with advanced/ongoing HEP to improve outcomes and carryover.  Baseline: no advanced HEP yet 05/28/24: advanced today  06/27/24:  patient HEP is updated, but she can teach back all exercises Goal status: MET  2.  Patient will report at least 50-75% improvement in L knee pain to improve QOL. Baseline: 8/10 worst 06/10/24:  2/10 worst 06/27/24:  0/10 Goal status: MET  3.  Patient will demonstrate improved L knee AROM to >/= 0-130 deg to allow for normal gait and stair mechanics. Baseline: Refer to above LE ROM table 05/28/24:  see ROM table above; -4 to 95 deg 06/10/24:  -4 to 100 deg 06/27/24:  0-105 Goal status: IN PROGRESS  4.  Patient will demonstrate improved LLE strength to >/= 5/5 for improved stability and ease of mobility. Baseline: Refer to above LE MMT table Goal status: INITIAL  5.  Patient will be able to ambulate 30' with no device and normal gait pattern without increased pain to access community.  Baseline: 5 min 06/10/24:  patient reports can ambulate 20 minutes at [facility] using her cane Goal status: IN PROGRESS- 06/13/24  6. Patient will be able to ascend/descend stairs with 1 HR and reciprocal step pattern safely to access home and community.  Baseline: 1 step at a time holding rail leading with RLE  only going up/LLE only going down Goal status: IN PROGRESS- 06/13/24   7.  Patient will report >/= 50/80 on LEFS (MCID = 9 pts) to demonstrate improved functional ability. Baseline: 30/80 Goal status: MET- 06/13/24 -- 63 / 80 = 78.8 %  8.  Patient  will demonstrate at least 19/24 on DGI to decrease risk of falls. Baseline: TBD 06/06/24:  20/24 Goal status: MET-  9.  Patient will improve on TUG test to </= 14 sec to reduce fall risk Baseline: 25.61 sec 06/03/24:  13.48 sec 06/27/24:  10.66 SEC Goal status: MET  10.  Patient will improve on 5X STS to </= to 12 sec to demonstrate improved glut/quad strength for independence with transfers from low seating surfaces   Baseline:  26.49 sec 06/06/24:  16.88 sec 06/27/24:  11.70 sec with hands;  10.55 SEC NO HANDS   Goal status:  MET  PLAN:  PT FREQUENCY: 1-2x/week  PT DURATION: 8 weeks  PLANNED INTERVENTIONS: 97750- Physical Performance Testing, 97110-Therapeutic exercises, 97530- Therapeutic activity, 97112- Neuromuscular re-education, 97535- Self Care, [id number]- Manual therapy, [id number]- Electrical stimulation (unattended), 97016- Vasopneumatic device, 20560 (1-2 muscles), 20561 (3+ muscles)- Dry Needling, Patient/Family education, Taping, Joint mobilization, Cryotherapy, and Moist heat  PLAN FOR NEXT SESSION:  review flexion stretching and HEP.   D/C to HEP  Jahree Dermody, PT 06/27/2024, 11:38 AM  "

## 2024-07-01 ENCOUNTER — Ambulatory Visit: Admitting: Rehabilitation

## 2024-07-03 ENCOUNTER — Ambulatory Visit: Admitting: Rehabilitation

## 2024-07-03 DIAGNOSIS — R262 Difficulty in walking, not elsewhere classified: Secondary | ICD-10-CM

## 2024-07-03 DIAGNOSIS — M25661 Stiffness of right knee, not elsewhere classified: Secondary | ICD-10-CM

## 2024-07-03 DIAGNOSIS — R600 Localized edema: Secondary | ICD-10-CM

## 2024-07-03 DIAGNOSIS — M6281 Muscle weakness (generalized): Secondary | ICD-10-CM

## 2024-07-03 DIAGNOSIS — M25561 Pain in right knee: Secondary | ICD-10-CM

## 2024-07-03 NOTE — Therapy (Addendum)
 " OUTPATIENT PHYSICAL THERAPY LOWER EXTREMITY TREATMENT     Patient Name: Ann Foster MRN: [id number] DOB:08/14/1965, 59 y.o., female Today's Date: 07/03/2024   END OF SESSION:  PT End of Session - 07/03/24 0936     Visit Number 14    Date for Recertification  07/08/24    PT Start Time 0931    PT Stop Time 1019    PT Time Calculation (min) 48 min    Activity Tolerance Patient tolerated treatment well;No increased pain    Behavior During Therapy [facility] for tasks assessed/performed                     Past Medical History:  Diagnosis Date   Ankle fracture 2016   Right   Aortic atherosclerosis    trace calcific atherosclerosis aortic arch per ct neck done 12-22-17   Bronchitis    Diabetes mellitus without complication (HCC)    GERD (gastroesophageal reflux disease)    Hypertension    OA (osteoarthritis) of shoulder    left shoulder, both knees arthritis   Osteoarthritis, knee    Sleep apnea    wears CPAP   Past Surgical History:  Procedure Laterality Date   CHOLECYSTECTOMY N/A 04/15/2023   Procedure: LAPAROSCOPIC CHOLECYSTECTOMY WITH ICG DYE;  Surgeon: Ebbie Cough, MD;  Location: [facility];  Service: General;  Laterality: N/A;   COLONOSCOPY     TOTAL KNEE ARTHROPLASTY Right 11/20/2023   Procedure: ARTHROPLASTY, KNEE, TOTAL RIGHT;  Surgeon: Jerri Kay HERO, MD;  Location: [facility] OR;  Service: Orthopedics;  Laterality: Right;   TOTAL KNEE ARTHROPLASTY Left 04/29/2024   Procedure: ARTHROPLASTY, KNEE, TOTAL;  Surgeon: Jerri Kay HERO, MD;  Location: [facility] OR;  Service: Orthopedics;  Laterality: Left;   Patient Active Problem List   Diagnosis Date Noted   Status post total left knee replacement 04/29/2024   Primary osteoarthritis of left knee 02/22/2024   Status post total right knee replacement 11/20/2023   Snoring 06/07/2023   Acute cholecystitis 04/13/2023   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,  with long-term current use of insulin  (HCC) 04/25/2022   Amblyopia of eye, right 07/27/2021   Corneal guttata of left eye 07/27/2021   Influenza vaccine refused 07/29/2020   COVID-19 vaccine dose declined 07/29/2020   Vasomotor symptoms due to menopause 04/24/2020   Atrophic vaginitis 04/24/2020   Impingement syndrome of left shoulder 01/23/2018    PCP: Theotis Haze ORN, NP   REFERRING PROVIDER: Jule Ronal CROME, PA-C   REFERRING DIAG: [phone] (ICD-10-CM) - Hx of total knee replacement, left  THERAPY DIAG:  Acute pain of right knee  Stiffness of right knee, not elsewhere classified  Difficulty in walking, not elsewhere classified  Muscle weakness (generalized)  Localized edema  RATIONALE FOR EVALUATION AND TREATMENT: Rehabilitation  ONSET DATE: 04/30/24  NEXT MD VISIT: 05/13/24   SUBJECTIVE:  SUBJECTIVE STATEMENT:   Pt reports everything is going well, still on board with finishing D/C  EVAL:  59 y/o patient referred to PT for L TKA by Dr Jerri on 04/30/24.  She is well known to us  from recent R TKA.   She is 2 weeks post op tomorrow.   Unclear why it took so long for her to get into see us .   She did not have any HH PT.   She reports she has been doing some of her home exercises from her R TKA on her own at home.  She has an Aquacel bandage in place over the L knee incision that is going to be removed/changed tomorrow when she f/u with Dr Jerri.  The bandage is loose, but it has not loosened to the inner square yet so the incision is not exposed.   Patient is advised that if it does come loose exposing the incision that she is call MD office and report.   She is also recommended not to shower or get the bandage wet until after MD appointment tomorrow, since it is just barely intact.      PAIN: Are you having pain? Yes: NPRS scale: 0/10 Pain location: L knee Pain description: aching/sore always,  sharp at times Aggravating factors: prolonged walking, end range stretching into flexion/extension Relieving factors: rest, ice, muscle relaxers  PERTINENT HISTORY:  DM, HTN, bronchitis, R TKA  PRECAUTIONS: None  RED FLAGS: None  WEIGHT BEARING RESTRICTIONS: No  FALLS:  Has patient fallen in last 6 months? No  LIVING ENVIRONMENT: Lives with: lives with their family Lives in: House/apartment Stairs: Yes: External: 1 steps; none Has following equipment at home: Single point cane, Walker - 2 wheeled, and bed side commode  OCCUPATION: retired  PLOF: Independent with gait  PATIENT GOALS: get back 100%    OBJECTIVE: (objective measures completed at initial evaluation unless otherwise dated)  DIAGNOSTIC FINDINGS:  N/A  PATIENT SURVEYS:  LEFS  Extreme difficulty/unable (0), Quite a bit of difficulty (1), Moderate difficulty (2), Little difficulty (3), No difficulty (4) Survey date:  05/13/24  Any of your usual work, housework or school activities 1  2. Usual hobbies, recreational or sporting activities 1  3. Getting into/out of the bath 3  4. Walking between rooms 3  5. Putting on socks/shoes 3  6. Squatting  1  7. Lifting an object, like a bag of groceries from the floor 1  8. Performing light activities around your home 2  9. Performing heavy activities around your home 1  10. Getting into/out of a car 2  11. Walking 2 blocks 1  12. Walking 1 mile 0  13. Going up/down 10 stairs (1 flight) 2  14. Standing for 1 hour 3  15.  sitting for 1 hour 3  16. Running on even ground 0  17. Running on uneven ground 0  18. Making sharp turns while running fast 0  19. Hopping  0  20. Rolling over in bed 3  Score total:  30/80     COGNITION: Overall cognitive status: Within functional limits for tasks assessed    SENSATION: WFL  EDEMA:  Circumferential: L  knee = 48.5 cm;  R knee = 44.0 cm  POSTURE:  No Significant postural limitations  PALPATION: Tender to palpate around the L knee in general   LOWER EXTREMITY ROM:  Active ROM Right eval Left eval  Hip flexion    Hip extension    Hip abduction  Hip adduction    Hip internal rotation    Hip external rotation    Knee flexion    Knee extension    Ankle dorsiflexion    Ankle plantarflexion    Ankle inversion    Ankle eversion     Passive ROM Right eval Left eval 05/15/24 05/22/24 AROM 05/28/24 LLE AROM 06/06/24 L knee L knee 06/10/24 L Knee 06/17/24 LLE 06/27/24 L 07/03/24 R 07/03/24  Hip flexion             Hip extension             Hip abduction             Hip adduction             Hip internal rotation             Hip external rotation             Knee flexion 90 85 98 103 95 104 sitting  100 sit and scoot  105 deg SKTC 108 seated 105  Knee extension 0 -6 6  -5 4 LAQ   0 with prone knee extension hang;  3 with supine QS 4 LAQ 4 LAQ  Ankle dorsiflexion             Ankle plantarflexion             Ankle inversion             Ankle eversion             (Blank rows = not tested)  LOWER EXTREMITY MMT:  MMT Right eval Left eval R 07/03/24 L 07/03/24  Hip flexion 5 4- 5 5  Hip extension      Hip abduction 4+ 3+ 5 4+  Hip adduction      Hip internal rotation 4+ 4- 5 5  Hip external rotation 5 4 5 5   Knee flexion 5 4 5 5   Knee extension 5 3+ 4+ 5  Ankle dorsiflexion 5 4+ 5 5  Ankle plantarflexion      Ankle inversion      Ankle eversion       (Blank rows = not tested)  LOWER EXTREMITY SPECIAL TESTS:  N/a  FUNCTIONAL TESTS:  5X STS = 26.49 sec TUG = 25.61 sec  GAIT: Distance walked: into clinic x 150' Assistive device utilized: Environmental Consultant - 2 wheeled Level of assistance: SBA Gait pattern: decreased step length- Left and decreased stance time- Left Comments: antalgic, decreased knee flexion in swing, decreased TKE at heel strike   TODAY'S TREATMENT:   07/03/24 Nustep L6x68min UE/LE  Tested LE strength and ROM Leg press BLE 25lb x 20 Calf press BLE 25lb x 20 Sliding lateral lunge BLE x 10 Sliding reverse lunges x 10 BLE Kettlebell swings 10lb 2x10 Farmer carry 10lb unilateral with opp LE 3x5 sec holds  Education details: working on step ups at home as well as SKTC flexion stretch for ROM improvement  Person educated: Patient Education method: Programmer, Multimedia, Facilities Manager, Verbal cues, Tactile cues, Handouts, and MedBridgeGO app access provided Education comprehension: verbalized understanding, verbal cues required, tactile cues required, and needs further education  HOME EXERCISE PROGRAM: Access Code: [email or web address] URL: [email or web address] Date: 07/03/2024 Prepared by: Sharonna Vinje  Exercises - Supine Ankle Pumps  - 1 x daily - 7 x weekly - 3 sets - 10 reps - Supine Quad Set  - 1 x daily - 7 x weekly - 3 sets - 10 reps -  Supine Heel Slides  - 1 x daily - 7 x weekly - 3 sets - 10 reps - Supine Short Arc Quad  - 1 x daily - 7 x weekly - 3 sets - 10 reps - Supine Straight Leg Raises  - 1 x daily - 7 x weekly - 1-2 sets - 10 reps - Standing Ankle Dorsiflexion with Table Support  - 1 x daily - 7 x weekly - 3 sets - 10 reps - Standing Heel Raises  - 1 x daily - 7 x weekly - 3 sets - 10 reps - Seated Knee Extension Stretch with Chair  - 1 x daily - 7 x weekly - 1 sets - 3 reps - 3-5 min hold - Seated Knee Flexion Stretch  - 1 x daily - 7 x weekly - 1 sets - 2-3 reps - 1 min hold - Prone Knee Flexion  - 1 x daily - 7 x weekly - 3 sets - 10 reps - Prone Hip Extension  - 1 x daily - 7 x weekly - 3 sets - 10 reps - Standing Gastroc Stretch on Foam 1/2 Roll  - 1 x daily - 7 x weekly - 1 sets - 2 reps - 1 min hold - Standing Soleus Stretch on Foam 1/2 Roll  - 1 x daily - 7 x weekly - 1 sets - 2 reps -  hold - Side Lunge with Counter Support  - 1 x daily - 7 x weekly - 3 sets - 10 reps - Forward Lunge with Back Leg Straight and  Counter Support  - 1 x daily - 7 x weekly - 3 sets - 10 reps - Kettlebell Swing  - 1 x daily - 7 x weekly - 3 sets - 10 reps  ASSESSMENT:  CLINICAL IMPRESSION:  Today we reassessed LE strength and her AROM, all other goals were assessed by PT last visit. Patient demonstrates improvement in LE strength bil and AROM symmetrical bil knees. We worked on functional strengthening exercises to improve squatting ability, kneeling abilities, and balance. Pt had no complaints of pain during the session. Will plan for discharge from PT as of today. Patient has met most goals.  EVAL:  Briseis Aguilera is a 59 y.o. female who was referred to physical therapy for evaluation and treatment for L TKA.  She is well known to us  from recent R TKA.  Surgery was 12/2, so she is 2 weeks post op.   Surprisingly she is not as stiff as we would have expected for 2 weeks without PT.   ROM is -5 to 85 degrees.    Pain is worse with end range flexion or extension of the L knee and with prolonged ambulation.  Patient has deficits in L knee ROM, LLE strength, walking,balance, edema, and pain which are interfering with ADLs and are impacting quality of life.  On LEFS patient scored 30/80 demonstrating moderate functional limitation.  Nayelis will benefit from skilled PT to address above deficits to improve mobility and activity tolerance with decreased pain interference.   ---OF NOTE--the patient began to c/o calf pain at the end of vasopneumatic treatment today.   She relates the pain is posterolateral at the popliteal fossa and just distal to the fibular head on the upper lateral calf.   She states she has been having this pain since surgery.  However, she reports her eliquis  was D/C early due to bruising so she has not been taking it.  Contacted Dr Benjiman office and informed them of the above information.  Patient will f/u with their office tomorrow for bandage change/removal and is advised to discuss this pain with them.  She is  advised to call 911 if she experiences any chest pain, SOB, diaphoresis, etc.    OBJECTIVE IMPAIRMENTS: difficulty walking, decreased ROM, decreased strength, increased edema, and pain.   ACTIVITY LIMITATIONS: carrying, lifting, bending, standing, squatting, stairs, and locomotion level  PARTICIPATION LIMITATIONS: meal prep, cleaning, laundry, driving, shopping, and community activity  PERSONAL FACTORS: Age and 1-2 comorbidities: DM, HTN, bronchitis, R TKA are also affecting patient's functional outcome.   REHAB POTENTIAL: Good  CLINICAL DECISION MAKING: Evolving/moderate complexity  EVALUATION COMPLEXITY: Moderate   GOALS: Goals reviewed with patient? Yes  SHORT TERM GOALS: Target date: 06/10/2024   Patient will be independent with initial HEP. Baseline: 100% PT assist required for correct completion 05/28/24:  patient can teach back 100% Goal status: MET  2.  Patient will report at least 25% improvement in L knee pain to improve QOL. Baseline: 8/10 worst 05/28/24:  7/10 06/17/24:  0/10 for the most part except for with end range knee flexion RLE Goal status: MET  3.  Patient will ambulate with a SPC or no device indoors on level ground independently with good balance Baseline: walker dependent with SBA  05/28/24:  SPC with mild limp 06/17/24:   no device in clinic with slight limp 06/27/24:   independent gait with no device, no limp Goal status: MET   LONG TERM GOALS: Target date: 07/08/2024   Patient will be independent with advanced/ongoing HEP to improve outcomes and carryover.  Baseline: no advanced HEP yet 05/28/24: advanced today  06/27/24:  patient HEP is updated, but she can teach back all exercises Goal status: MET  2.  Patient will report at least 50-75% improvement in L knee pain to improve QOL. Baseline: 8/10 worst 06/10/24:  2/10 worst 06/27/24:  0/10 Goal status: MET  3.  Patient will demonstrate improved L knee AROM to >/= 0-130 deg to allow for normal  gait and stair mechanics. Baseline: Refer to above LE ROM table 05/28/24:  see ROM table above; -4 to 95 deg 06/10/24:  -4 to 100 deg 06/27/24:  0-105 Goal status: NOT MET- 07/03/24  4.  Patient will demonstrate improved LLE strength to >/= 5/5 for improved stability and ease of mobility. Baseline: Refer to above LE MMT table Goal status: PARTIALLY MET- 07/03/24  5.  Patient will be able to ambulate 30' with no device and normal gait pattern without increased pain to access community.  Baseline: 5 min 06/10/24:  patient reports can ambulate 20 minutes at [facility] using her cane Goal status: NOT MET- 06/13/24  6. Patient will be able to ascend/descend stairs with 1 HR and reciprocal step pattern safely to access home and community.  Baseline: 1 step at a time holding rail leading with RLE only going up/LLE only going down Goal status: NOT MET- 06/13/24   7.  Patient will report >/= 50/80 on LEFS (MCID = 9 pts) to demonstrate improved functional ability. Baseline: 30/80 Goal status: MET- 06/13/24 -- 63 / 80 = 78.8 %  8.  Patient will demonstrate at least 19/24 on DGI to decrease risk of falls. Baseline: TBD 06/06/24:  20/24 Goal status: MET-  9.  Patient will improve on TUG test to </= 14 sec to reduce fall risk Baseline: 25.61 sec 06/03/24:  13.48 sec 06/27/24:  10.66 SEC Goal status: MET  10.  Patient will improve on 5X STS to </= to 12 sec to demonstrate improved glut/quad strength for independence with transfers from low seating surfaces   Baseline:  26.49 sec 06/06/24:  16.88 sec 06/27/24:  11.70 sec with hands;  10.55 SEC NO HANDS   Goal status:  MET  PLAN:  PT FREQUENCY: 1-2x/week  PT DURATION: 8 weeks  PLANNED INTERVENTIONS: 97750- Physical Performance Testing, 97110-Therapeutic exercises, 97530- Therapeutic activity, 97112- Neuromuscular re-education, 97535- Self Care, [id number]- Manual therapy, [id number]- Electrical stimulation (unattended), 97016- Vasopneumatic device, 20560 (1-2  muscles), 20561 (3+ muscles)- Dry Needling, Patient/Family education, Taping, Joint mobilization, Cryotherapy, and Moist heat  PLAN FOR NEXT SESSION: D/C to HEP  Catlin Aycock L Jeanelle Dake, PTA 07/03/2024, 10:19 AM  PHYSICAL THERAPY DISCHARGE SUMMARY  Visits from Start of Care: 14  Current functional level related to goals / functional outcomes: Patient is ambulating independently and has met all of her goals for PT.  She will be D/C at this time to continue with her home exercises on her own   Remaining deficits: L knee ROM is 0-105 degrees   Education / Equipment: Patient is independent with all home exercises and advised to continue daily as tolerated and call us  with any questions   Patient agrees to discharge. Patient goals were met. Patient is being discharged due to meeting the stated rehab goals.  "

## 2024-07-09 ENCOUNTER — Ambulatory Visit: Payer: Self-pay | Admitting: Nurse Practitioner

## 2024-07-23 ENCOUNTER — Encounter: Admitting: Physician Assistant
